# Patient Record
Sex: Female | Born: 1945 | Race: White | Hispanic: Yes | Smoking: Never smoker
Health system: Southern US, Community
[De-identification: ages and names within clinical notes are randomized; demographics above are authoritative.]

## PROBLEM LIST (undated history)

## (undated) VITALS — BP 118/79 | HR 104 | Temp 97.0°F | Resp 16 | Ht 60.0 in | Wt 121.0 lb

## (undated) DIAGNOSIS — R112 Nausea with vomiting, unspecified: Secondary | ICD-10-CM

## (undated) DIAGNOSIS — Z87898 Personal history of other specified conditions: Secondary | ICD-10-CM

## (undated) DIAGNOSIS — K589 Irritable bowel syndrome without diarrhea: Secondary | ICD-10-CM

## (undated) DIAGNOSIS — E785 Hyperlipidemia, unspecified: Secondary | ICD-10-CM

## (undated) DIAGNOSIS — F419 Anxiety disorder, unspecified: Secondary | ICD-10-CM

## (undated) DIAGNOSIS — I10 Essential (primary) hypertension: Secondary | ICD-10-CM

## (undated) DIAGNOSIS — I509 Heart failure, unspecified: Secondary | ICD-10-CM

## (undated) DIAGNOSIS — R51 Headache: Secondary | ICD-10-CM

## (undated) DIAGNOSIS — I421 Obstructive hypertrophic cardiomyopathy: Secondary | ICD-10-CM

## (undated) DIAGNOSIS — M1990 Unspecified osteoarthritis, unspecified site: Secondary | ICD-10-CM

## (undated) DIAGNOSIS — Z973 Presence of spectacles and contact lenses: Secondary | ICD-10-CM

## (undated) DIAGNOSIS — F329 Major depressive disorder, single episode, unspecified: Secondary | ICD-10-CM

## (undated) DIAGNOSIS — I447 Left bundle-branch block, unspecified: Secondary | ICD-10-CM

## (undated) DIAGNOSIS — E039 Hypothyroidism, unspecified: Secondary | ICD-10-CM

## (undated) DIAGNOSIS — S0990XA Unspecified injury of head, initial encounter: Secondary | ICD-10-CM

## (undated) DIAGNOSIS — Z9889 Other specified postprocedural states: Secondary | ICD-10-CM

## (undated) DIAGNOSIS — R7303 Prediabetes: Secondary | ICD-10-CM

## (undated) DIAGNOSIS — J4530 Mild persistent asthma, uncomplicated: Secondary | ICD-10-CM

## (undated) DIAGNOSIS — F410 Panic disorder [episodic paroxysmal anxiety] without agoraphobia: Secondary | ICD-10-CM

## (undated) DIAGNOSIS — K219 Gastro-esophageal reflux disease without esophagitis: Secondary | ICD-10-CM

## (undated) DIAGNOSIS — R002 Palpitations: Secondary | ICD-10-CM

## (undated) DIAGNOSIS — H409 Unspecified glaucoma: Secondary | ICD-10-CM

## (undated) DIAGNOSIS — J189 Pneumonia, unspecified organism: Secondary | ICD-10-CM

## (undated) DIAGNOSIS — F32A Depression, unspecified: Secondary | ICD-10-CM

## (undated) DIAGNOSIS — D649 Anemia, unspecified: Secondary | ICD-10-CM

## (undated) DIAGNOSIS — R519 Headache, unspecified: Secondary | ICD-10-CM

## (undated) DIAGNOSIS — E7800 Pure hypercholesterolemia, unspecified: Secondary | ICD-10-CM

## (undated) HISTORY — PX: LAPAROSCOPIC CHOLECYSTECTOMY: SUR755

## (undated) HISTORY — DX: Anxiety disorder, unspecified: F41.9

## (undated) HISTORY — DX: Obstructive hypertrophic cardiomyopathy: I42.1

## (undated) HISTORY — PX: CARDIAC SURGERY: SHX584

## (undated) HISTORY — PX: TUBAL LIGATION: SHX77

## (undated) HISTORY — DX: Headache: R51

## (undated) HISTORY — DX: Irritable bowel syndrome, unspecified: K58.9

## (undated) HISTORY — DX: Pure hypercholesterolemia, unspecified: E78.00

## (undated) HISTORY — DX: Headache, unspecified: R51.9

## (undated) HISTORY — PX: CATARACT EXTRACTION W/ INTRAOCULAR LENS  IMPLANT, BILATERAL: SHX1307

---

## 2007-09-17 ENCOUNTER — Emergency Department (HOSPITAL_COMMUNITY): Admission: EM | Admit: 2007-09-17 | Discharge: 2007-09-17 | Payer: Self-pay | Admitting: Emergency Medicine

## 2007-10-25 ENCOUNTER — Emergency Department (HOSPITAL_COMMUNITY): Admission: EM | Admit: 2007-10-25 | Discharge: 2007-10-25 | Payer: Self-pay | Admitting: Emergency Medicine

## 2007-12-11 ENCOUNTER — Encounter: Admission: RE | Admit: 2007-12-11 | Discharge: 2007-12-11 | Payer: Self-pay | Admitting: Family Medicine

## 2007-12-13 ENCOUNTER — Emergency Department (HOSPITAL_COMMUNITY): Admission: EM | Admit: 2007-12-13 | Discharge: 2007-12-13 | Payer: Self-pay | Admitting: Emergency Medicine

## 2008-02-12 ENCOUNTER — Emergency Department (HOSPITAL_COMMUNITY): Admission: EM | Admit: 2008-02-12 | Discharge: 2008-02-13 | Payer: Self-pay | Admitting: Emergency Medicine

## 2008-04-28 ENCOUNTER — Emergency Department (HOSPITAL_COMMUNITY): Admission: EM | Admit: 2008-04-28 | Discharge: 2008-04-28 | Payer: Self-pay | Admitting: Emergency Medicine

## 2008-05-11 ENCOUNTER — Encounter: Payer: Self-pay | Admitting: Family Medicine

## 2008-05-11 ENCOUNTER — Ambulatory Visit: Payer: Self-pay | Admitting: Family Medicine

## 2008-05-11 LAB — CONVERTED CEMR LAB
ALT: 35 units/L (ref 0–35)
AST: 31 units/L (ref 0–37)
Albumin: 4.4 g/dL (ref 3.5–5.2)
Alkaline Phosphatase: 89 units/L (ref 39–117)
BUN: 19 mg/dL (ref 6–23)
Basophils Absolute: 0 10*3/uL (ref 0.0–0.1)
Basophils Relative: 1 % (ref 0–1)
CO2: 28 meq/L (ref 19–32)
Calcium: 9 mg/dL (ref 8.4–10.5)
Chloride: 104 meq/L (ref 96–112)
Cholesterol: 126 mg/dL (ref 0–200)
Creatinine, Ser: 0.63 mg/dL (ref 0.40–1.20)
Eosinophils Absolute: 0.1 10*3/uL (ref 0.0–0.7)
Eosinophils Relative: 1 % (ref 0–5)
Glucose, Bld: 87 mg/dL (ref 70–99)
HCT: 41.2 % (ref 36.0–46.0)
HDL: 45 mg/dL (ref >39)
Hemoglobin: 13.1 g/dL (ref 12.0–15.0)
LDL Cholesterol: 61 mg/dL (ref 0–99)
Lymphocytes Relative: 36 % (ref 12–46)
Lymphs Abs: 2.3 10*3/uL (ref 0.7–4.0)
MCHC: 31.8 g/dL (ref 30.0–36.0)
MCV: 89.2 fL (ref 78.0–100.0)
Monocytes Absolute: 0.7 10*3/uL (ref 0.1–1.0)
Monocytes Relative: 11 % (ref 3–12)
Neutro Abs: 3.2 10*3/uL (ref 1.7–7.7)
Neutrophils Relative %: 51 % (ref 43–77)
Platelets: 289 10*3/uL (ref 150–400)
Potassium: 3.9 meq/L (ref 3.5–5.3)
RBC: 4.62 M/uL (ref 3.87–5.11)
RDW: 14.3 % (ref 11.5–15.5)
Sodium: 143 meq/L (ref 135–145)
TSH: 4.375 microintl units/mL (ref 0.350–5.50)
Total Bilirubin: 0.5 mg/dL (ref 0.3–1.2)
Total CHOL/HDL Ratio: 2.8
Total Protein: 7.2 g/dL (ref 6.0–8.3)
Triglycerides: 100 mg/dL (ref <150)
VLDL: 20 mg/dL (ref 0–40)
WBC: 6.3 10*3/uL (ref 4.0–10.5)

## 2008-05-14 ENCOUNTER — Ambulatory Visit (HOSPITAL_COMMUNITY): Admission: RE | Admit: 2008-05-14 | Discharge: 2008-05-14 | Payer: Self-pay | Admitting: Family Medicine

## 2008-09-14 ENCOUNTER — Ambulatory Visit: Payer: Self-pay | Admitting: Family Medicine

## 2008-09-22 ENCOUNTER — Ambulatory Visit (HOSPITAL_COMMUNITY): Admission: RE | Admit: 2008-09-22 | Discharge: 2008-09-22 | Payer: Self-pay | Admitting: Family Medicine

## 2008-09-23 ENCOUNTER — Encounter: Payer: Self-pay | Admitting: Family Medicine

## 2008-09-23 ENCOUNTER — Ambulatory Visit: Payer: Self-pay | Admitting: Internal Medicine

## 2008-09-23 LAB — CONVERTED CEMR LAB
ALT: 20 units/L (ref 0–35)
AST: 23 units/L (ref 0–37)
Albumin: 4.4 g/dL (ref 3.5–5.2)
Alkaline Phosphatase: 89 units/L (ref 39–117)
BUN: 17 mg/dL (ref 6–23)
Basophils Absolute: 0 10*3/uL (ref 0.0–0.1)
Basophils Relative: 1 % (ref 0–1)
CO2: 25 meq/L (ref 19–32)
Calcium: 8.9 mg/dL (ref 8.4–10.5)
Chloride: 105 meq/L (ref 96–112)
Cholesterol: 145 mg/dL (ref 0–200)
Creatinine, Ser: 0.68 mg/dL (ref 0.40–1.20)
Eosinophils Absolute: 0.1 10*3/uL (ref 0.0–0.7)
Eosinophils Relative: 2 % (ref 0–5)
Glucose, Bld: 89 mg/dL (ref 70–99)
HCT: 40.3 % (ref 36.0–46.0)
HDL: 53 mg/dL (ref >39)
Hemoglobin: 13 g/dL (ref 12.0–15.0)
LDL Cholesterol: 73 mg/dL (ref 0–99)
Lymphocytes Relative: 37 % (ref 12–46)
Lymphs Abs: 1.5 10*3/uL (ref 0.7–4.0)
MCHC: 32.3 g/dL (ref 30.0–36.0)
MCV: 86.7 fL (ref 78.0–100.0)
Monocytes Absolute: 0.4 10*3/uL (ref 0.1–1.0)
Monocytes Relative: 9 % (ref 3–12)
Neutro Abs: 2.1 10*3/uL (ref 1.7–7.7)
Neutrophils Relative %: 51 % (ref 43–77)
Platelets: 221 10*3/uL (ref 150–400)
Potassium: 4.3 meq/L (ref 3.5–5.3)
RBC: 4.65 M/uL (ref 3.87–5.11)
RDW: 13.9 % (ref 11.5–15.5)
Sed Rate: 8 mm/hr (ref 0–22)
Sodium: 142 meq/L (ref 135–145)
TSH: 3.21 microintl units/mL (ref 0.350–4.50)
Total Bilirubin: 1.2 mg/dL (ref 0.3–1.2)
Total CHOL/HDL Ratio: 2.7
Total Protein: 7.1 g/dL (ref 6.0–8.3)
Triglycerides: 94 mg/dL (ref <150)
VLDL: 19 mg/dL (ref 0–40)
WBC: 4.1 10*3/uL (ref 4.0–10.5)

## 2008-11-03 ENCOUNTER — Encounter: Payer: Self-pay | Admitting: Internal Medicine

## 2008-11-03 ENCOUNTER — Other Ambulatory Visit: Admission: RE | Admit: 2008-11-03 | Discharge: 2008-11-03 | Payer: Self-pay | Admitting: Internal Medicine

## 2008-11-03 ENCOUNTER — Ambulatory Visit: Payer: Self-pay | Admitting: Internal Medicine

## 2008-11-10 ENCOUNTER — Ambulatory Visit (HOSPITAL_COMMUNITY): Admission: RE | Admit: 2008-11-10 | Discharge: 2008-11-10 | Payer: Self-pay | Admitting: Family Medicine

## 2008-11-24 ENCOUNTER — Emergency Department (HOSPITAL_COMMUNITY): Admission: EM | Admit: 2008-11-24 | Discharge: 2008-11-24 | Payer: Self-pay | Admitting: Emergency Medicine

## 2008-11-25 ENCOUNTER — Ambulatory Visit: Payer: Self-pay | Admitting: Internal Medicine

## 2009-03-22 ENCOUNTER — Emergency Department (HOSPITAL_COMMUNITY): Admission: EM | Admit: 2009-03-22 | Discharge: 2009-03-22 | Payer: Self-pay | Admitting: Emergency Medicine

## 2009-03-25 ENCOUNTER — Emergency Department (HOSPITAL_COMMUNITY): Admission: EM | Admit: 2009-03-25 | Discharge: 2009-03-25 | Payer: Self-pay | Admitting: Emergency Medicine

## 2009-06-16 ENCOUNTER — Ambulatory Visit (HOSPITAL_COMMUNITY): Admission: RE | Admit: 2009-06-16 | Discharge: 2009-06-16 | Payer: Self-pay | Admitting: Internal Medicine

## 2009-08-27 ENCOUNTER — Ambulatory Visit: Payer: Self-pay | Admitting: Internal Medicine

## 2009-11-28 ENCOUNTER — Emergency Department (HOSPITAL_COMMUNITY): Admission: EM | Admit: 2009-11-28 | Discharge: 2009-11-28 | Payer: Self-pay | Admitting: Emergency Medicine

## 2009-11-29 ENCOUNTER — Inpatient Hospital Stay (HOSPITAL_COMMUNITY): Admission: EM | Admit: 2009-11-29 | Discharge: 2009-12-03 | Payer: Self-pay | Admitting: Psychiatry

## 2009-11-29 ENCOUNTER — Ambulatory Visit: Payer: Self-pay | Admitting: Psychiatry

## 2010-01-21 ENCOUNTER — Ambulatory Visit: Payer: Self-pay | Admitting: Internal Medicine

## 2010-02-03 ENCOUNTER — Encounter (INDEPENDENT_AMBULATORY_CARE_PROVIDER_SITE_OTHER): Payer: Self-pay | Admitting: Surgery

## 2010-02-03 ENCOUNTER — Ambulatory Visit (HOSPITAL_COMMUNITY): Admission: RE | Admit: 2010-02-03 | Discharge: 2010-02-04 | Payer: Self-pay | Admitting: Surgery

## 2010-03-30 ENCOUNTER — Emergency Department (HOSPITAL_COMMUNITY): Admission: EM | Admit: 2010-03-30 | Discharge: 2010-03-30 | Payer: Self-pay | Admitting: Emergency Medicine

## 2010-05-04 ENCOUNTER — Ambulatory Visit (HOSPITAL_COMMUNITY): Admission: RE | Admit: 2010-05-04 | Discharge: 2010-05-04 | Payer: Self-pay | Admitting: Gastroenterology

## 2010-06-14 ENCOUNTER — Inpatient Hospital Stay (HOSPITAL_COMMUNITY): Admission: AD | Admit: 2010-06-14 | Discharge: 2010-06-14 | Payer: Self-pay | Admitting: Obstetrics and Gynecology

## 2010-07-19 ENCOUNTER — Ambulatory Visit (HOSPITAL_COMMUNITY): Admission: RE | Admit: 2010-07-19 | Discharge: 2010-07-19 | Payer: Self-pay | Admitting: Family Medicine

## 2010-08-04 ENCOUNTER — Ambulatory Visit: Payer: Self-pay | Admitting: Obstetrics and Gynecology

## 2010-08-11 ENCOUNTER — Ambulatory Visit (HOSPITAL_COMMUNITY): Admission: RE | Admit: 2010-08-11 | Discharge: 2010-08-11 | Payer: Self-pay | Admitting: Obstetrics & Gynecology

## 2010-10-03 ENCOUNTER — Emergency Department (HOSPITAL_COMMUNITY): Admission: EM | Admit: 2010-10-03 | Discharge: 2010-10-03 | Payer: Self-pay | Admitting: Emergency Medicine

## 2010-12-31 ENCOUNTER — Encounter: Payer: Self-pay | Admitting: Gastroenterology

## 2011-01-01 ENCOUNTER — Encounter: Payer: Self-pay | Admitting: Gastroenterology

## 2011-02-22 LAB — DIFFERENTIAL
Basophils Absolute: 0 10*3/uL (ref 0.0–0.1)
Basophils Relative: 1 % (ref 0–1)
Eosinophils Absolute: 0 10*3/uL (ref 0.0–0.7)
Eosinophils Relative: 1 % (ref 0–5)
Lymphocytes Relative: 32 % (ref 12–46)
Lymphs Abs: 1.3 10*3/uL (ref 0.7–4.0)
Monocytes Absolute: 0.3 10*3/uL (ref 0.1–1.0)
Monocytes Relative: 8 % (ref 3–12)
Neutro Abs: 2.4 10*3/uL (ref 1.7–7.7)
Neutrophils Relative %: 58 % (ref 43–77)

## 2011-02-22 LAB — CK TOTAL AND CKMB (NOT AT ARMC)
CK, MB: 1.4 ng/mL (ref 0.3–4.0)
Relative Index: 1.3 (ref 0.0–2.5)
Total CK: 111 U/L (ref 7–177)

## 2011-02-22 LAB — CBC
HCT: 39.5 % (ref 36.0–46.0)
Hemoglobin: 12.7 g/dL (ref 12.0–15.0)
MCH: 27.7 pg (ref 26.0–34.0)
MCHC: 32.2 g/dL (ref 30.0–36.0)
MCV: 86.1 fL (ref 78.0–100.0)
Platelets: 223 10*3/uL (ref 150–400)
RBC: 4.59 MIL/uL (ref 3.87–5.11)
RDW: 13.3 % (ref 11.5–15.5)
WBC: 4.1 10*3/uL (ref 4.0–10.5)

## 2011-02-22 LAB — COMPREHENSIVE METABOLIC PANEL
ALT: 27 U/L (ref 0–35)
AST: 35 U/L (ref 0–37)
Albumin: 3.8 g/dL (ref 3.5–5.2)
Alkaline Phosphatase: 91 U/L (ref 39–117)
BUN: 8 mg/dL (ref 6–23)
CO2: 29 mEq/L (ref 19–32)
Calcium: 9 mg/dL (ref 8.4–10.5)
Chloride: 106 mEq/L (ref 96–112)
Creatinine, Ser: 0.76 mg/dL (ref 0.4–1.2)
GFR calc Af Amer: >60 mL/min (ref >60)
GFR calc non Af Amer: >60 mL/min (ref >60)
Glucose, Bld: 99 mg/dL (ref 70–99)
Potassium: 4.1 mEq/L (ref 3.5–5.1)
Sodium: 141 mEq/L (ref 135–145)
Total Bilirubin: 1.3 mg/dL — ABNORMAL HIGH (ref 0.3–1.2)
Total Protein: 7 g/dL (ref 6.0–8.3)

## 2011-02-22 LAB — LIPASE, BLOOD: Lipase: 26 U/L (ref 11–59)

## 2011-02-22 LAB — TROPONIN I: Troponin I: <0.01 ng/mL (ref 0.00–0.06)

## 2011-02-24 DIAGNOSIS — F411 Generalized anxiety disorder: Secondary | ICD-10-CM | POA: Insufficient documentation

## 2011-02-24 DIAGNOSIS — J309 Allergic rhinitis, unspecified: Secondary | ICD-10-CM | POA: Insufficient documentation

## 2011-02-24 DIAGNOSIS — F339 Major depressive disorder, recurrent, unspecified: Secondary | ICD-10-CM | POA: Insufficient documentation

## 2011-02-26 LAB — URINALYSIS, ROUTINE W REFLEX MICROSCOPIC
Bilirubin Urine: NEGATIVE
Glucose, UA: NEGATIVE mg/dL
Ketones, ur: NEGATIVE mg/dL
Nitrite: NEGATIVE
Protein, ur: NEGATIVE mg/dL
Specific Gravity, Urine: >1.03 — ABNORMAL HIGH (ref 1.005–1.030)
Urobilinogen, UA: 0.2 mg/dL (ref 0.0–1.0)
pH: 6 (ref 5.0–8.0)

## 2011-02-26 LAB — URINE MICROSCOPIC-ADD ON

## 2011-02-28 LAB — URINALYSIS, ROUTINE W REFLEX MICROSCOPIC
Bilirubin Urine: NEGATIVE
Glucose, UA: NEGATIVE mg/dL
Hgb urine dipstick: NEGATIVE
Ketones, ur: NEGATIVE mg/dL
Nitrite: NEGATIVE
Protein, ur: NEGATIVE mg/dL
Specific Gravity, Urine: 1.008 (ref 1.005–1.030)
Urobilinogen, UA: 0.2 mg/dL (ref 0.0–1.0)
pH: 6 (ref 5.0–8.0)

## 2011-02-28 LAB — CBC
HCT: 39.7 % (ref 36.0–46.0)
Hemoglobin: 13.4 g/dL (ref 12.0–15.0)
MCHC: 33.8 g/dL (ref 30.0–36.0)
MCV: 87.2 fL (ref 78.0–100.0)
Platelets: 231 10*3/uL (ref 150–400)
RBC: 4.55 MIL/uL (ref 3.87–5.11)
RDW: 13 % (ref 11.5–15.5)
WBC: 5.8 10*3/uL (ref 4.0–10.5)

## 2011-02-28 LAB — DIFFERENTIAL
Basophils Absolute: 0 10*3/uL (ref 0.0–0.1)
Basophils Relative: 1 % (ref 0–1)
Eosinophils Absolute: 0.1 10*3/uL (ref 0.0–0.7)
Eosinophils Relative: 1 % (ref 0–5)
Lymphocytes Relative: 32 % (ref 12–46)
Lymphs Abs: 1.9 10*3/uL (ref 0.7–4.0)
Monocytes Absolute: 0.5 10*3/uL (ref 0.1–1.0)
Monocytes Relative: 9 % (ref 3–12)
Neutro Abs: 3.3 10*3/uL (ref 1.7–7.7)
Neutrophils Relative %: 57 % (ref 43–77)

## 2011-02-28 LAB — POCT CARDIAC MARKERS
CKMB, poc: <1 ng/mL — ABNORMAL LOW (ref 1.0–8.0)
Myoglobin, poc: 55.4 ng/mL (ref 12–200)
Troponin i, poc: <0.05 ng/mL (ref 0.00–0.09)

## 2011-02-28 LAB — HEPATIC FUNCTION PANEL
ALT: 35 U/L (ref 0–35)
AST: 34 U/L (ref 0–37)
Albumin: 4.1 g/dL (ref 3.5–5.2)
Alkaline Phosphatase: 107 U/L (ref 39–117)
Bilirubin, Direct: 0.2 mg/dL (ref 0.0–0.3)
Indirect Bilirubin: 1.2 mg/dL — ABNORMAL HIGH (ref 0.3–0.9)
Total Bilirubin: 1.4 mg/dL — ABNORMAL HIGH (ref 0.3–1.2)
Total Protein: 7.4 g/dL (ref 6.0–8.3)

## 2011-02-28 LAB — BASIC METABOLIC PANEL
BUN: 15 mg/dL (ref 6–23)
CO2: 30 mEq/L (ref 19–32)
Calcium: 8.6 mg/dL (ref 8.4–10.5)
Chloride: 105 mEq/L (ref 96–112)
Creatinine, Ser: 0.66 mg/dL (ref 0.4–1.2)
GFR calc Af Amer: >60 mL/min (ref >60)
GFR calc non Af Amer: >60 mL/min (ref >60)
Glucose, Bld: 94 mg/dL (ref 70–99)
Potassium: 3.7 mEq/L (ref 3.5–5.1)
Sodium: 138 mEq/L (ref 135–145)

## 2011-02-28 LAB — HEMOCCULT GUIAC POC 1CARD (OFFICE): Fecal Occult Bld: NEGATIVE

## 2011-02-28 LAB — URINE MICROSCOPIC-ADD ON

## 2011-03-01 LAB — COMPREHENSIVE METABOLIC PANEL
ALT: 35 U/L (ref 0–35)
AST: 33 U/L (ref 0–37)
Albumin: 4 g/dL (ref 3.5–5.2)
Alkaline Phosphatase: 103 U/L (ref 39–117)
BUN: 12 mg/dL (ref 6–23)
CO2: 32 mEq/L (ref 19–32)
Calcium: 9.3 mg/dL (ref 8.4–10.5)
Chloride: 103 mEq/L (ref 96–112)
Creatinine, Ser: 0.73 mg/dL (ref 0.4–1.2)
GFR calc Af Amer: >60 mL/min (ref >60)
GFR calc non Af Amer: >60 mL/min (ref >60)
Glucose, Bld: 84 mg/dL (ref 70–99)
Potassium: 4.1 mEq/L (ref 3.5–5.1)
Sodium: 141 mEq/L (ref 135–145)
Total Bilirubin: 1.2 mg/dL (ref 0.3–1.2)
Total Protein: 7.4 g/dL (ref 6.0–8.3)

## 2011-03-01 LAB — CBC
HCT: 41.4 % (ref 36.0–46.0)
Hemoglobin: 14 g/dL (ref 12.0–15.0)
MCHC: 33.8 g/dL (ref 30.0–36.0)
MCV: 87.8 fL (ref 78.0–100.0)
Platelets: 236 10*3/uL (ref 150–400)
RBC: 4.71 MIL/uL (ref 3.87–5.11)
RDW: 12.8 % (ref 11.5–15.5)
WBC: 6 10*3/uL (ref 4.0–10.5)

## 2011-03-13 LAB — DIFFERENTIAL
Basophils Absolute: 0 10*3/uL (ref 0.0–0.1)
Basophils Relative: 1 % (ref 0–1)
Eosinophils Absolute: 0 10*3/uL (ref 0.0–0.7)
Eosinophils Relative: 1 % (ref 0–5)
Lymphocytes Relative: 34 % (ref 12–46)
Lymphs Abs: 1.3 10*3/uL (ref 0.7–4.0)
Monocytes Absolute: 0.3 10*3/uL (ref 0.1–1.0)
Monocytes Relative: 9 % (ref 3–12)
Neutro Abs: 2.2 10*3/uL (ref 1.7–7.7)
Neutrophils Relative %: 56 % (ref 43–77)

## 2011-03-13 LAB — COMPREHENSIVE METABOLIC PANEL
ALT: 36 U/L — ABNORMAL HIGH (ref 0–35)
ALT: 32 U/L (ref 0–35)
AST: 32 U/L (ref 0–37)
AST: 32 U/L (ref 0–37)
Albumin: 3.9 g/dL (ref 3.5–5.2)
Albumin: 4.1 g/dL (ref 3.5–5.2)
Alkaline Phosphatase: 93 U/L (ref 39–117)
Alkaline Phosphatase: 92 U/L (ref 39–117)
BUN: 10 mg/dL (ref 6–23)
BUN: 10 mg/dL (ref 6–23)
CO2: 27 mEq/L (ref 19–32)
CO2: 28 mEq/L (ref 19–32)
Calcium: 8.7 mg/dL (ref 8.4–10.5)
Calcium: 9 mg/dL (ref 8.4–10.5)
Chloride: 105 mEq/L (ref 96–112)
Chloride: 105 mEq/L (ref 96–112)
Creatinine, Ser: 0.71 mg/dL (ref 0.4–1.2)
Creatinine, Ser: 0.86 mg/dL (ref 0.4–1.2)
GFR calc Af Amer: >60 mL/min (ref >60)
GFR calc Af Amer: >60 mL/min (ref >60)
GFR calc non Af Amer: >60 mL/min (ref >60)
GFR calc non Af Amer: >60 mL/min (ref >60)
Glucose, Bld: 100 mg/dL — ABNORMAL HIGH (ref 70–99)
Glucose, Bld: 87 mg/dL (ref 70–99)
Potassium: 3.7 mEq/L (ref 3.5–5.1)
Potassium: 4 mEq/L (ref 3.5–5.1)
Sodium: 139 mEq/L (ref 135–145)
Sodium: 140 mEq/L (ref 135–145)
Total Bilirubin: 1.1 mg/dL (ref 0.3–1.2)
Total Bilirubin: 1.9 mg/dL — ABNORMAL HIGH (ref 0.3–1.2)
Total Protein: 7.2 g/dL (ref 6.0–8.3)
Total Protein: 7.2 g/dL (ref 6.0–8.3)

## 2011-03-13 LAB — CBC
HCT: 39.5 % (ref 36.0–46.0)
HCT: 41.2 % (ref 36.0–46.0)
Hemoglobin: 13.4 g/dL (ref 12.0–15.0)
Hemoglobin: 13.6 g/dL (ref 12.0–15.0)
MCHC: 33.9 g/dL (ref 30.0–36.0)
MCHC: 32.9 g/dL (ref 30.0–36.0)
MCV: 87.4 fL (ref 78.0–100.0)
MCV: 87.6 fL (ref 78.0–100.0)
Platelets: 206 10*3/uL (ref 150–400)
Platelets: 231 10*3/uL (ref 150–400)
RBC: 4.53 MIL/uL (ref 3.87–5.11)
RBC: 4.71 MIL/uL (ref 3.87–5.11)
RDW: 13.1 % (ref 11.5–15.5)
RDW: 13.6 % (ref 11.5–15.5)
WBC: 3.9 10*3/uL — ABNORMAL LOW (ref 4.0–10.5)
WBC: 5.1 10*3/uL (ref 4.0–10.5)

## 2011-03-13 LAB — TSH: TSH: 4.606 u[IU]/mL — ABNORMAL HIGH (ref 0.350–4.500)

## 2011-03-13 LAB — POCT CARDIAC MARKERS
CKMB, poc: 1.2 ng/mL (ref 1.0–8.0)
Myoglobin, poc: 63.5 ng/mL (ref 12–200)
Troponin i, poc: <0.05 ng/mL (ref 0.00–0.09)

## 2011-06-10 ENCOUNTER — Emergency Department (HOSPITAL_COMMUNITY)
Admission: EM | Admit: 2011-06-10 | Discharge: 2011-06-10 | Disposition: A | Discharge status: Home / Self Care | Payer: Medicare Other | Attending: Emergency Medicine | Admitting: Emergency Medicine

## 2011-06-10 ENCOUNTER — Emergency Department (HOSPITAL_COMMUNITY): Payer: Medicare Other

## 2011-06-10 DIAGNOSIS — R5381 Other malaise: Secondary | ICD-10-CM | POA: Insufficient documentation

## 2011-06-10 DIAGNOSIS — J45909 Unspecified asthma, uncomplicated: Secondary | ICD-10-CM | POA: Insufficient documentation

## 2011-06-10 DIAGNOSIS — R059 Cough, unspecified: Secondary | ICD-10-CM | POA: Insufficient documentation

## 2011-06-10 DIAGNOSIS — I10 Essential (primary) hypertension: Secondary | ICD-10-CM | POA: Insufficient documentation

## 2011-06-10 DIAGNOSIS — I2510 Atherosclerotic heart disease of native coronary artery without angina pectoris: Secondary | ICD-10-CM | POA: Insufficient documentation

## 2011-06-10 DIAGNOSIS — R05 Cough: Secondary | ICD-10-CM | POA: Insufficient documentation

## 2011-08-31 LAB — POCT URINALYSIS DIP (DEVICE)
Glucose, UA: NEGATIVE
Nitrite: NEGATIVE
Operator id: 200941
Protein, ur: NEGATIVE
Specific Gravity, Urine: 1.02
Urobilinogen, UA: 0.2
pH: 5

## 2011-08-31 LAB — URINE CULTURE
Colony Count: NO GROWTH
Culture: NO GROWTH

## 2011-09-04 LAB — I-STAT 8, (EC8 V) (CONVERTED LAB)
Acid-Base Excess: 3 — ABNORMAL HIGH
BUN: 17
Bicarbonate: 28.5 — ABNORMAL HIGH
Chloride: 104
Glucose, Bld: 102 — ABNORMAL HIGH
HCT: 44
Hemoglobin: 15
Operator id: 294341
Potassium: 3.7
Sodium: 138
TCO2: 30
pCO2, Ven: 44.9 — ABNORMAL LOW
pH, Ven: 7.41 — ABNORMAL HIGH

## 2011-09-04 LAB — CBC
HCT: 37.3
Hemoglobin: 12.7
MCHC: 33.9
MCV: 85.2
Platelets: 215
RBC: 4.38
RDW: 13.3
WBC: 13.6 — ABNORMAL HIGH

## 2011-09-04 LAB — DIFFERENTIAL
Basophils Absolute: 0
Basophils Relative: 0
Eosinophils Absolute: 0
Eosinophils Relative: 0
Lymphocytes Relative: 10 — ABNORMAL LOW
Lymphs Abs: 1.3
Monocytes Absolute: 1.1 — ABNORMAL HIGH
Monocytes Relative: 8
Neutro Abs: 11.2 — ABNORMAL HIGH
Neutrophils Relative %: 82 — ABNORMAL HIGH

## 2011-09-04 LAB — POCT CARDIAC MARKERS
CKMB, poc: 1.6
Myoglobin, poc: 42.4
Operator id: 294341
Troponin i, poc: <0.05

## 2011-09-04 LAB — POCT I-STAT CREATININE
Creatinine, Ser: 0.9
Operator id: 294341

## 2011-09-06 LAB — URINALYSIS, ROUTINE W REFLEX MICROSCOPIC
Bilirubin Urine: NEGATIVE
Glucose, UA: NEGATIVE
Hgb urine dipstick: NEGATIVE
Ketones, ur: 40 — AB
Leukocytes, UA: NEGATIVE
Nitrite: NEGATIVE
Protein, ur: 30 — AB
Specific Gravity, Urine: 1.023
Urobilinogen, UA: 0.2
pH: 7.5

## 2011-09-06 LAB — URINE MICROSCOPIC-ADD ON

## 2011-09-06 LAB — BASIC METABOLIC PANEL
BUN: 18
CO2: 25
Calcium: 9.4
Chloride: 104
Creatinine, Ser: 0.66
GFR calc Af Amer: >60
GFR calc non Af Amer: >60
Glucose, Bld: 89
Potassium: 3.7
Sodium: 139

## 2011-09-15 LAB — POCT I-STAT, CHEM 8
BUN: 13 mg/dL (ref 6–23)
Calcium, Ion: 1.14 mmol/L (ref 1.12–1.32)
Chloride: 104 mEq/L (ref 96–112)
Creatinine, Ser: 0.9 mg/dL (ref 0.4–1.2)
Glucose, Bld: 88 mg/dL (ref 70–99)
HCT: 44 % (ref 36.0–46.0)
Hemoglobin: 15 g/dL (ref 12.0–15.0)
Potassium: 3.9 mEq/L (ref 3.5–5.1)
Sodium: 141 mEq/L (ref 135–145)
TCO2: 29 mmol/L (ref 0–100)

## 2011-09-15 LAB — SALICYLATE LEVEL: Salicylate Lvl: <4 mg/dL (ref 2.8–20.0)

## 2011-09-15 LAB — URINALYSIS, ROUTINE W REFLEX MICROSCOPIC
Bilirubin Urine: NEGATIVE
Glucose, UA: NEGATIVE mg/dL
Hgb urine dipstick: NEGATIVE
Ketones, ur: NEGATIVE mg/dL
Nitrite: NEGATIVE
Protein, ur: NEGATIVE mg/dL
Specific Gravity, Urine: 1.013 (ref 1.005–1.030)
Urobilinogen, UA: 0.2 mg/dL (ref 0.0–1.0)
pH: 7 (ref 5.0–8.0)

## 2011-09-15 LAB — URINE MICROSCOPIC-ADD ON

## 2011-09-15 LAB — RAPID URINE DRUG SCREEN, HOSP PERFORMED
Amphetamines: NOT DETECTED
Barbiturates: NOT DETECTED
Benzodiazepines: POSITIVE — AB
Cocaine: NOT DETECTED
Opiates: NOT DETECTED
Tetrahydrocannabinol: NOT DETECTED

## 2011-09-15 LAB — ETHANOL: Alcohol, Ethyl (B): <5 mg/dL (ref 0–10)

## 2011-09-15 LAB — ACETAMINOPHEN LEVEL: Acetaminophen (Tylenol), Serum: <10 ug/mL — ABNORMAL LOW (ref 10–30)

## 2011-09-19 LAB — I-STAT 8, (EC8 V) (CONVERTED LAB)
Acid-Base Excess: 3 — ABNORMAL HIGH
BUN: 16
Bicarbonate: 27.9 — ABNORMAL HIGH
Chloride: 105
Glucose, Bld: 106 — ABNORMAL HIGH
HCT: 45
Hemoglobin: 15.3 — ABNORMAL HIGH
Operator id: 114141
Potassium: 3.9
Sodium: 139
TCO2: 29
pCO2, Ven: 41.3 — ABNORMAL LOW
pH, Ven: 7.438 — ABNORMAL HIGH

## 2011-09-19 LAB — POCT I-STAT CREATININE
Creatinine, Ser: 0.8
Operator id: 114141

## 2011-09-19 LAB — COMPREHENSIVE METABOLIC PANEL
ALT: 19
AST: 44 — ABNORMAL HIGH
Albumin: 4.1
Alkaline Phosphatase: 101
BUN: 15
CO2: 29
Calcium: 9.1
Chloride: 102
Creatinine, Ser: 0.81
GFR calc Af Amer: >60
GFR calc non Af Amer: >60
Glucose, Bld: 114 — ABNORMAL HIGH
Potassium: 4.2
Sodium: 140
Total Bilirubin: 1.7 — ABNORMAL HIGH
Total Protein: 7.2

## 2011-09-19 LAB — MAGNESIUM: Magnesium: 2.2

## 2011-09-19 LAB — TSH: TSH: 8.469 — ABNORMAL HIGH

## 2011-09-19 LAB — CBC
HCT: 39.9
Hemoglobin: 13.4
MCHC: 33.6
MCV: 84.8
Platelets: 270
RBC: 4.71
RDW: 13.3
WBC: 4.3

## 2011-09-19 LAB — POCT CARDIAC MARKERS
CKMB, poc: 1.8
Myoglobin, poc: 71
Operator id: 114141
Troponin i, poc: <0.05

## 2011-09-19 LAB — D-DIMER, QUANTITATIVE: D-Dimer, Quant: 0.8 — ABNORMAL HIGH

## 2011-11-19 ENCOUNTER — Other Ambulatory Visit: Payer: Self-pay

## 2011-11-19 ENCOUNTER — Emergency Department (HOSPITAL_COMMUNITY)
Admission: EM | Admit: 2011-11-19 | Discharge: 2011-11-19 | Disposition: A | Discharge status: Home / Self Care | Payer: Medicare Other | Attending: Emergency Medicine | Admitting: Emergency Medicine

## 2011-11-19 ENCOUNTER — Emergency Department (HOSPITAL_COMMUNITY): Payer: Medicare Other

## 2011-11-19 ENCOUNTER — Encounter: Payer: Self-pay | Admitting: *Deleted

## 2011-11-19 DIAGNOSIS — R51 Headache: Secondary | ICD-10-CM | POA: Insufficient documentation

## 2011-11-19 DIAGNOSIS — J4530 Mild persistent asthma, uncomplicated: Secondary | ICD-10-CM | POA: Insufficient documentation

## 2011-11-19 DIAGNOSIS — R079 Chest pain, unspecified: Secondary | ICD-10-CM | POA: Insufficient documentation

## 2011-11-19 DIAGNOSIS — F411 Generalized anxiety disorder: Secondary | ICD-10-CM | POA: Insufficient documentation

## 2011-11-19 DIAGNOSIS — Z9889 Other specified postprocedural states: Secondary | ICD-10-CM | POA: Insufficient documentation

## 2011-11-19 DIAGNOSIS — I422 Other hypertrophic cardiomyopathy: Secondary | ICD-10-CM | POA: Insufficient documentation

## 2011-11-19 DIAGNOSIS — J45909 Unspecified asthma, uncomplicated: Secondary | ICD-10-CM | POA: Insufficient documentation

## 2011-11-19 DIAGNOSIS — E782 Mixed hyperlipidemia: Secondary | ICD-10-CM | POA: Insufficient documentation

## 2011-11-19 DIAGNOSIS — I2510 Atherosclerotic heart disease of native coronary artery without angina pectoris: Secondary | ICD-10-CM | POA: Insufficient documentation

## 2011-11-19 DIAGNOSIS — K219 Gastro-esophageal reflux disease without esophagitis: Secondary | ICD-10-CM | POA: Insufficient documentation

## 2011-11-19 DIAGNOSIS — Z79899 Other long term (current) drug therapy: Secondary | ICD-10-CM | POA: Insufficient documentation

## 2011-11-19 DIAGNOSIS — E785 Hyperlipidemia, unspecified: Secondary | ICD-10-CM

## 2011-11-19 DIAGNOSIS — R002 Palpitations: Secondary | ICD-10-CM | POA: Insufficient documentation

## 2011-11-19 DIAGNOSIS — I10 Essential (primary) hypertension: Secondary | ICD-10-CM | POA: Insufficient documentation

## 2011-11-19 DIAGNOSIS — R42 Dizziness and giddiness: Secondary | ICD-10-CM | POA: Insufficient documentation

## 2011-11-19 DIAGNOSIS — F410 Panic disorder [episodic paroxysmal anxiety] without agoraphobia: Secondary | ICD-10-CM | POA: Insufficient documentation

## 2011-11-19 HISTORY — DX: Gastro-esophageal reflux disease without esophagitis: K21.9

## 2011-11-19 HISTORY — DX: Palpitations: R00.2

## 2011-11-19 HISTORY — DX: Hyperlipidemia, unspecified: E78.5

## 2011-11-19 HISTORY — DX: Panic disorder (episodic paroxysmal anxiety): F41.0

## 2011-11-19 LAB — CBC
HCT: 37.9 % (ref 36.0–46.0)
Hemoglobin: 12.6 g/dL (ref 12.0–15.0)
MCH: 29 pg (ref 26.0–34.0)
MCHC: 33.2 g/dL (ref 30.0–36.0)
MCV: 87.1 fL (ref 78.0–100.0)
Platelets: 219 10*3/uL (ref 150–400)
RBC: 4.35 MIL/uL (ref 3.87–5.11)
RDW: 12.9 % (ref 11.5–15.5)
WBC: 5.4 10*3/uL (ref 4.0–10.5)

## 2011-11-19 LAB — BASIC METABOLIC PANEL
BUN: 16 mg/dL (ref 6–23)
CO2: 31 mEq/L (ref 19–32)
Calcium: 9.1 mg/dL (ref 8.4–10.5)
Chloride: 103 mEq/L (ref 96–112)
Creatinine, Ser: 0.71 mg/dL (ref 0.50–1.10)
GFR calc Af Amer: >90 mL/min (ref >90)
GFR calc non Af Amer: 88 mL/min — ABNORMAL LOW (ref >90)
Glucose, Bld: 83 mg/dL (ref 70–99)
Potassium: 3.4 mEq/L — ABNORMAL LOW (ref 3.5–5.1)
Sodium: 141 mEq/L (ref 135–145)

## 2011-11-19 LAB — POCT I-STAT TROPONIN I
Troponin i, poc: 0 ng/mL (ref 0.00–0.08)
Troponin i, poc: 0.01 ng/mL (ref 0.00–0.08)

## 2011-11-19 MED ORDER — METOPROLOL TARTRATE 12.5 MG HALF TABLET: 12.5000 mg | ORAL_TABLET | Freq: Two times a day (BID) | ORAL | Status: DC

## 2011-11-19 MED ORDER — METOPROLOL TARTRATE 25 MG PO TABS
12.5000 mg | ORAL_TABLET | Freq: Once | ORAL | Status: AC
  Administered 2011-11-19: 12.5 mg via ORAL
  Filled 2011-11-19: qty 1

## 2011-11-19 NOTE — ED Notes (Signed)
Patient transported to X-ray 

## 2011-11-19 NOTE — Consult Note (Signed)
[facility]       CONSULTATION NOTE  Reason for Consult: Palpitations, weakness  Requesting Physician: Dr. Anitra Lauth  HPI: This is a 65 y.o. female Jehov[employer]'s Witness with a past medical history significant for hypertrophic cardiomyopathy status post septal myectomy in August of 2011. A recent echocardiogram performed in our office in September showed an EF greater than 55% and septal measurements of about 1.7-1.8 cm. She also has a history of shingles which bothered her for greater than one year it is and he marked dyslipidemia on Vytorin. Recently she's been having some weakness and feelings of palpitations. She presented to the ER tonight after having an episode of palpitations which were unrelenting and church this morning. Afterward she felt tightness in her chest and throat as well as numbness in both arms and overall generalized weakness. The episode reportedly lasted for 3-5 minutes. She's not complained of any significant chest pain in the past. I'm not aware of the last stress test she had in our office. Currently she is asymptomatic however does feel somewhat nervous about her symptoms.  PMHx:  Past Medical History  Diagnosis Date  . Panic attacks   . Hypertrophic cardiomyopathy   . GERD (gastroesophageal reflux disease)   . HLD (hyperlipidemia)   . Asthma   . HTN (hypertension)   . Palpitations    Past Surgical History  Procedure Date  . Coronary artery bypass graft   . Tubal ligation   . Cholecystectomy     FAMHx: History reviewed. No pertinent family history. Family members were screened negative genetically for HOCM.  SOCHx:  reports that she has never smoked. She does not have any smokeless tobacco history on file. She reports that she does not drink alcohol or use illicit drugs.  ALLERGIES: No Known Allergies  ROS: A comprehensive review of systems was negative except for: Constitutional: positive for fatigue Cardiovascular:  positive for chest pain, near-syncope and palpitations Musculoskeletal: positive for muscle weakness Behavioral/Psych: positive for anxiety  HOME MEDICATIONS:  (Not in a hospital admission)  HOSPITAL MEDICATIONS: Prior to Admission:   1. Protonix 40 mg daily 2. Vytorin 09/20/39 mg daily 3. Celexa 40 mg daily 4. Clonazepam 2 mg each bedtime 5. Super B complex vitamin 6. Calcium and vitamin D 7. Ostial biflex 8. Systane eye drops  VITALS: Blood pressure 123/81, pulse 81, temperature 97.7 F (36.5 C), temperature source Oral, resp. rate 19, SpO2 100.00%.  PHYSICAL EXAM: General appearance: alert and anxious Neck: no adenopathy, no carotid bruit, no JVD, supple, symmetrical, trachea midline and thyroid not enlarged, symmetric, no tenderness/mass/nodules Lungs: clear to auscultation bilaterally Heart: regular rate and rhythm, S1, S2 normal and systolic murmur: early systolic 2/6, low pitch at 2nd right intercostal space Abdomen: soft, non-tender; bowel sounds normal; no masses,  no organomegaly Extremities: extremities normal, atraumatic, no cyanosis or edema Pulses: 2+ and symmetric Skin: Skin color, texture, turgor normal. No rashes or lesions Neurologic: Grossly normal  LABS: Results for orders placed during the hospital encounter of 11/19/11 (from the past 48 hour(s))  CBC     Status: Normal   Collection Time   11/19/11  4:06 PM      Component Value Range Comment   WBC 5.4  4.0 - 10.5 (K/uL)    RBC 4.35  3.87 - 5.11 (MIL/uL)    Hemoglobin 12.6  12.0 - 15.0 (g/dL)    HCT 16.1  09.6 - 04.5 (%)    MCV 87.1  78.0 - 100.0 (  fL)    MCH 29.0  26.0 - 34.0 (pg)    MCHC 33.2  30.0 - 36.0 (g/dL)    RDW 04.5  40.9 - 81.1 (%)    Platelets 219  150 - 400 (K/uL)   BASIC METABOLIC PANEL     Status: Abnormal   Collection Time   11/19/11  4:06 PM      Component Value Range Comment   Sodium 141  135 - 145 (mEq/L)    Potassium 3.4 (*) 3.5 - 5.1 (mEq/L)    Chloride 103  96 - 112  (mEq/L)    CO2 31  19 - 32 (mEq/L)    Glucose, Bld 83  70 - 99 (mg/dL)    BUN 16  6 - 23 (mg/dL)    Creatinine, Ser 9.14  0.50 - 1.10 (mg/dL)    Calcium 9.1  8.4 - 10.5 (mg/dL)    GFR calc non Af Amer 88 (*) >90 (mL/min)    GFR calc Af Amer >90  >90 (mL/min)   POCT I-STAT TROPONIN I     Status: Normal   Collection Time   11/19/11  4:45 PM      Component Value Range Comment   Troponin i, poc 0.00  0.00 - 0.08 (ng/mL)    Comment 3            POCT I-STAT TROPONIN I     Status: Normal   Collection Time   11/19/11  8:26 PM      Component Value Range Comment   Troponin i, poc 0.01  0.00 - 0.08 (ng/mL)    Comment 3              IMAGING: Dg Chest 2 View  11/19/2011  *RADIOLOGY REPORT*  Clinical Data: Chest palpitations and chest pain.  CHEST - 2 VIEW  Comparison: Plain films of the chest 06/10/2011 and 02/13/2008.  Findings: The patient is status post CABG.  The lungs are clear. Heart size is normal.  No pneumothorax or pleural effusion.  IMPRESSION: No acute disease.  Original Report Authenticated By: Bernadene Bell. D'ALESSIO, M.D.   EKG:  Sinus rhythm with left bundle branch block, unchanged from previous EKG.   IMPRESSION: 1. Palpitations 2. Chest pain, possibly secondary to palpitations 3. Hypertension 4. Dyslipidemia 5. Hypertrophic cardiomyopathy status post septal myectomy  RECOMMENDATION: 1. Ms. Axelson is describing tachypalpitations may represent atrial ventricular arrhythmias. She has discontinued her aspirin and is not currently on a beta blocker. The symptoms are somewhat atypical for cardiac chest pain, however not aware that she has had a stress test recently.  If her second set of cardiac enzymes is negative, I feel that she could be safely discharged. I would recommend an outpatient stress test in our office.   2. I prescribed her for low-dose beta blocker. In addition she should resume taking aspirin 81 mg daily. 3. Our office will contact her for followup this  week.  Thank you for this interesting consult.  Time Spent Directly with Patient: 30 minutes  Chrystie Nose, MD Attending Cardiologist [facility]  Eriq Hufford C 11/19/2011, 9:01 PM

## 2011-11-19 NOTE — ED Notes (Signed)
Reports having intermittent chest pains for extended amount of time, today had onset at 1330 of mid chest pain, felt like heart was racing and had near syncope. At this time, reports still mild pain and generalized fatigue. ekg done at triage.

## 2011-11-19 NOTE — Discharge Instructions (Signed)
Palpitations  A palpitation is the feeling that your heartbeat is irregular or is faster than normal. Although this is frightening, it usually is not serious. Palpitations may be caused by excesses of smoking, caffeine, or alcohol. They are also brought on by stress and anxiety. Sometimes, they are caused by heart disease. Unless otherwise noted, your caregiver did not find any signs of serious illness at this time. HOME CARE INSTRUCTIONS  To help prevent palpitations:  Drink decaffeinated coffee, tea, and soda pop. Avoid chocolate.   If you smoke or drink alcohol, quit or cut down as much as possible.   Reduce your stress or anxiety level. Biofeedback, yoga, or meditation will help you relax. Physical activity such as swimming, jogging, or walking also may be helpful.  SEEK MEDICAL CARE IF:   You continue to have a fast heartbeat.   Your palpitations occur more often.  SEEK IMMEDIATE MEDICAL CARE IF: You develop chest pain, shortness of breath, severe headache, dizziness, or fainting. Document Released: 11/24/2000 Document Revised: 08/09/2011 Document Reviewed: 01/24/2008 [employer] Patient Information 2012 [place], [place].    Metoprolol tablets What is this medicine? METOPROLOL (me TOE proe lole) is a beta-blocker. Beta-blockers reduce the workload on the heart and help it to beat more regularly. This medicine is used to treat high blood pressure and to prevent chest pain. It is also used to after a heart attack and to prevent an additional heart attack from occurring. This medicine may be used for other purposes; ask your health care provider or pharmacist if you have questions. What should I tell my health care provider before I take this medicine? They need to know if you have any of these conditions: -diabetes -heart or vessel disease like slow heart rate, worsening heart failure, heart block, sick sinus syndrome or Raynaud's disease -kidney disease -liver disease -lung or  breathing disease, like asthma or emphysema -pheochromocytoma -thyroid disease -an unusual or allergic reaction to metoprolol, other beta-blockers, medicines, foods, dyes, or preservatives -pregnant or trying to get pregnant -breast-feeding How should I use this medicine? Take this medicine by mouth with a drink of water. Follow the directions on the prescription label. Take this medicine immediately after meals. Take your doses at regular intervals. Do not take more medicine than directed. Do not stop taking this medicine suddenly. This could lead to serious heart-related effects. Talk to your pediatrician regarding the use of this medicine in children. Special care may be needed. Overdosage: If you think you have taken too much of this medicine contact a poison control center or emergency room at once. NOTE: This medicine is only for you. Do not share this medicine with others. What if I miss a dose? If you miss a dose, take it as soon as you can. If it is almost time for your next dose, take only that dose. Do not take double or extra doses. What may interact with this medicine? Do not take this medicine with any of the following medications: -sotalol This medicine may also interact with the following medications: -clonidine -digoxin -dobutamine -epinephrine -isoproterenol -medicine to control heart rhythm like quinidine, propafenone -medicine for depression like monoamine oxidase (MAO) inhibitors, fluoxetine, and paroxetine -medicine for high blood pressure like calcium channel blockers -reserpine This list may not describe all possible interactions. Give your health care provider a list of all the medicines, herbs, non-prescription drugs, or dietary supplements you use. Also tell them if you smoke, drink alcohol, or use illegal drugs. Some items may interact with  your medicine. What should I watch for while using this medicine? Visit your doctor or health care professional for  regular check ups. Contact your doctor right away if your symptoms worsen. Check your blood pressure and pulse rate regularly. Ask your health care professional what your blood pressure and pulse rate should be, and when you should contact them. You may get drowsy or dizzy. Do not drive, use machinery, or do anything that needs mental alertness until you know how this medicine affects you. Do not sit or stand up quickly, especially if you are an older patient. This reduces the risk of dizzy or fainting spells. Contact your doctor if these symptoms continue. Alcohol may interfere with the effect of this medicine. Avoid alcoholic drinks. What side effects may I notice from receiving this medicine? Side effects that you should report to your doctor or health care professional as soon as possible: -allergic reactions like skin rash, itching or hives -cold or numb hands or feet -depression -difficulty breathing -faint -fever with sore throat -irregular heartbeat, chest pain -rapid weight gain -swollen legs or ankles Side effects that usually do not require medical attention (report to your doctor or health care professional if they continue or are bothersome): -anxiety or nervousness -change in sex drive or performance -dry skin -headache -nightmares or trouble sleeping -short term memory loss -stomach upset or diarrhea -unusually tired This list may not describe all possible side effects. Call your doctor for medical advice about side effects. You may report side effects to FDA at 1-800-FDA-1088. Where should I keep my medicine? Keep out of the reach of children. Store at room temperature between 15 and 30 degrees C (59 and 86 degrees F). Throw away any unused medicine after the expiration date. NOTE: This sheet is a summary. It may not cover all possible information. If you have questions about this medicine, talk to your doctor, pharmacist, or health care provider.  2012, [employer]vier[employer]  Standard. (02/07/2008 4:11:19 PM)

## 2011-11-19 NOTE — ED Notes (Signed)
C/o hunger, requesting food & drink, given, pending card consult, (denies: pain, sob, dizziness, nausea, numbness, tingling, palpitations, fluttering or other sx).

## 2011-11-19 NOTE — ED Provider Notes (Signed)
History     CSN: [id number] Arrival date & time: 11/19/2011  3:07 PM   First MD Initiated Contact with Patient 11/19/11 1524      Chief Complaint  Patient presents with  . Chest Pain    (Consider location/radiation/quality/duration/timing/severity/associated sxs/prior treatment) The history is provided by the patient and a relative.   The patient is a 65 year old female with history of coronary artery disease, anxiety, hypertrophic cardiomyopathy, hyperlipidemia, and hypertension who presents with a complaint of palpitations and a "funny feeling all over my body" starting about 1:30 PM today. He was acute in onset and started while she was sitting. The palpitations lasted for a few seconds while the "funny all over" feeling lasted for a few minutes. She describes some associated chest tightness with the palpitations, as well as a transient mild headache. She denies any associated shortness of breath, nausea, diaphoresis. Nothing seemed to make the pain better or worse while at lasted. There was no prior treatment.  The patient also notes that she has had intermittent chest and/or upper, pain for the last several weeks. The pain is vague and she is not able to describe it or to locate it with any certainty. She is unable to describe any accompanying symptoms and does not note any known alleviating or aggravating factors.  Her family member reports that she had been describing chest pain recently that occurs during the day during various activities. He notes that she does sometimes get chest discomfort with her panic attacks but these always occur at night.    Past Medical History  Diagnosis Date  . Coronary artery disease   . Panic attacks   . Hypertrophic cardiomyopathy   . GERD (gastroesophageal reflux disease)   . HLD (hyperlipidemia)   . Asthma   . HTN (hypertension)     Past Surgical History  Procedure Date  . Coronary artery bypass graft   . Tubal ligation   .  Cholecystectomy     History reviewed. No pertinent family history.  History  Substance Use Topics  . Smoking status: Never Smoker   . Smokeless tobacco: Not on file  . Alcohol Use: No    Review of Systems  Constitutional: Negative for fever and chills.  HENT: Negative for ear pain, congestion, sore throat, neck pain, neck stiffness and tinnitus.   Eyes: Negative for pain and visual disturbance.  Respiratory: Positive for chest tightness. Negative for cough and shortness of breath.   Cardiovascular: Positive for chest pain and palpitations. Negative for leg swelling.  Gastrointestinal: Negative for nausea, vomiting, abdominal pain and diarrhea.  Musculoskeletal: Negative for back pain, joint swelling and gait problem.  Skin: Negative for rash and wound.  Neurological: Positive for dizziness and headaches. Negative for seizures, syncope, facial asymmetry, weakness, light-headedness and numbness.  Psychiatric/Behavioral: Negative for behavioral problems and confusion.    Allergies  Review of patient's allergies indicates no known allergies.  Home Medications   Current Outpatient Rx  Name Route Sig Dispense Refill  . B COMPLEX-C PO TABS Oral Take 1 tablet by mouth daily.      Marland Kitchen CALCIUM-MAGNESIUM-ZINC 1000-400-15 MG PO TABS Oral Take 1 tablet by mouth 3 (three) times daily.      Marland Kitchen VITAMIN D 1000 UNITS PO TABS Oral Take 5,000 Units by mouth daily.      Marland Kitchen CITALOPRAM HYDROBROMIDE 20 MG PO TABS Oral Take 20 mg by mouth at bedtime.      Marland Kitchen CLONAZEPAM 2 MG PO TABS Oral Take 2  mg by mouth at bedtime.      Marland Kitchen EZETIMIBE-SIMVASTATIN 10-40 MG PO TABS Oral Take 0.5 tablets by mouth at bedtime.      . OMEGA-3 FATTY ACIDS 1000 MG PO CAPS Oral Take 1 g by mouth daily.      . OSTEO BI-FLEX ADV DOUBLE ST PO Oral Take 1 tablet by mouth 2 (two) times daily.      . MOMETASONE FUROATE 220 MCG/INH IN AEPB Inhalation Inhale 2 puffs into the lungs every morning.      Carma Leaven M PLUS PO TABS Oral Take 1  tablet by mouth daily.      Marland Kitchen PANTOPRAZOLE SODIUM 40 MG PO TBEC Oral Take 40 mg by mouth daily.      Marland Kitchen POLYETHYL GLYCOL-PROPYL GLYCOL 0.4-0.3 % OP SOLN Ophthalmic Apply 1 drop to eye 2 (two) times daily.        BP 124/82  Pulse 77  Temp(Src) 98.4 F (36.9 C) (Oral)  Resp 18  SpO2 99%  Physical Exam  Nursing note and vitals reviewed. Constitutional: She is oriented to person, place, and time. She appears well-developed and well-nourished. No distress.  HENT:  Head: Normocephalic and atraumatic.  Right Ear: External ear normal.  Left Ear: External ear normal.  Mouth/Throat: Oropharynx is clear and moist.  Eyes: EOM are normal. Pupils are equal, round, and reactive to light.  Neck: Normal range of motion. Neck supple.  Cardiovascular: Normal rate, regular rhythm, normal heart sounds and intact distal pulses.   No murmur heard. Pulmonary/Chest: Effort normal and breath sounds normal. No respiratory distress. She has no wheezes. She exhibits no tenderness.  Abdominal: Soft. Bowel sounds are normal. She exhibits no distension and no mass. There is no tenderness. There is no guarding.  Musculoskeletal: Normal range of motion. She exhibits no edema and no tenderness.  Lymphadenopathy:    She has no cervical adenopathy.  Neurological: She is alert and oriented to person, place, and time. No cranial nerve deficit. Coordination normal.       Finger to nose intact bilaterally. Sensation intact to light touch.  Skin: Skin is warm and dry. No rash noted.  Psychiatric: Her mood appears anxious.    ED Course  Procedures (including critical care time)  Labs Reviewed  BASIC METABOLIC PANEL - Abnormal; Notable for the following:    Potassium 3.4 (*)    GFR calc non Af Amer 88 (*)    All other components within normal limits  CBC  POCT I-STAT TROPONIN I  I-STAT TROPONIN I   Dg Chest 2 View  11/19/2011  *RADIOLOGY REPORT*  Clinical Data: Chest palpitations and chest pain.  CHEST - 2 VIEW   Comparison: Plain films of the chest 06/10/2011 and 02/13/2008.  Findings: The patient is status post CABG.  The lungs are clear. Heart size is normal.  No pneumothorax or pleural effusion.  IMPRESSION: No acute disease.  Original Report Authenticated By: Bernadene Bell. Maricela Curet, M.D.    Date: 11/19/2011  Rate: 76  Rhythm: normal sinus rhythm  QRS Axis: normal  Intervals: normal  ST/T Wave abnormalities: normal  Conduction Disutrbances:left bundle branch block  Narrative Interpretation:   Old EKG Reviewed: unchanged    1. Panic attacks   2. Hypertrophic cardiomyopathy   3. GERD (gastroesophageal reflux disease)   4. HLD (hyperlipidemia)   5. Asthma   6. HTN (hypertension)   7. Palpitations       MDM  3:51 PM Patient seen and evaluated. Initial history  and physical examination complete. Workup initiated. Will continue to follow closely.   7:00 PM I have reviewed the labs and x-rays as well as the patient EKG. There are no acute findings. I have placed a call to [facility] heart and vascular with the plan to have the patient admitted for further evaluation of her recent recurrent chest pain. A friend of the patient has arrived to was present during her episode today he notes that the patient did complain of left arm and neck pain while she was experiencing the palpitations.      8:02 PM I have put, Dr. Rennis Golden, with [facility] and Vascular. He is familiar with this patient. The mid-level practitioner with his practice will see the patient and get back to me with a plan.   9:22 PM I have spoken with Dr Rennis Golden, who saw the patient and would like to have her begin a beta-blocker Rx with continued workup as an outpatient. I will discharge her home with this prescription.  Elwyn Reach [place], [place] 11/19/11 2129

## 2011-11-19 NOTE — ED Provider Notes (Signed)
Medical screening examination/treatment/procedure(s) were conducted as a shared visit with non-physician practitioner(s) and myself.  I personally evaluated the patient during the encounter Patient with nondescript chest pain and palpitations. Palpitations sound as if it could be from a dysrhythmia however currently she is in sinus rhythm. Patient was seen by Dr. Adolm Joseph with [place] is alert and was placed on a beta blocker and will follow up on Monday the placement of a monitor.  Gwyneth Sprout, MD 11/19/11 [id number]

## 2011-11-19 NOTE — ED Notes (Signed)
Pending arrival of cardiology consult, family at [facility].

## 2011-11-19 NOTE — ED Notes (Signed)
Awaiting medicine to be verified by pharmacy and sent

## 2011-12-20 ENCOUNTER — Ambulatory Visit (INDEPENDENT_AMBULATORY_CARE_PROVIDER_SITE_OTHER): Payer: Medicare Other | Admitting: Obstetrics and Gynecology

## 2011-12-20 ENCOUNTER — Other Ambulatory Visit: Payer: Self-pay | Admitting: Obstetrics and Gynecology

## 2011-12-20 ENCOUNTER — Encounter: Payer: Self-pay | Admitting: Obstetrics and Gynecology

## 2011-12-20 DIAGNOSIS — N949 Unspecified condition associated with female genital organs and menstrual cycle: Secondary | ICD-10-CM

## 2011-12-20 DIAGNOSIS — N390 Urinary tract infection, site not specified: Secondary | ICD-10-CM

## 2011-12-20 DIAGNOSIS — R300 Dysuria: Secondary | ICD-10-CM | POA: Diagnosis not present

## 2011-12-20 DIAGNOSIS — R102 Pelvic and perineal pain: Secondary | ICD-10-CM

## 2011-12-20 LAB — POCT URINALYSIS DIP (DEVICE)
Bilirubin Urine: NEGATIVE
Glucose, UA: NEGATIVE mg/dL
Hgb urine dipstick: NEGATIVE
Ketones, ur: NEGATIVE mg/dL
Leukocytes, UA: NEGATIVE
Nitrite: NEGATIVE
Protein, ur: NEGATIVE mg/dL
Specific Gravity, Urine: >=1.03 (ref 1.005–1.030)
Urobilinogen, UA: 0.2 mg/dL (ref 0.0–1.0)
pH: 5.5 (ref 5.0–8.0)

## 2011-12-20 NOTE — Progress Notes (Signed)
S: Pt presents today c/o a burning sensation inside her abd when she urinates. She states the pain is not always present and she is not hurting now. She also c/o some urinary incontinence with valsalva. She denies vag dc or vag irritation. She denies vag bleeding, fever, or any other sx at this time. She states she has a hx of fibroids. O: VSS A&O x 3 in NAD Abd soft, non-tender to palpation NL external genitalia Bimanual exam reveal uterus to be non-tender. No evidence of large cystocele noted. No adnexal masses. No vag dc present. [name]: Abd pain: discussed with pt at length. Will send urine for culture and will get pelvic US. She will return to see a female provider per her choice in about 2wks. Discussed diet, activity, risks, and precautions.  Clinton Gallant. Jovahn Breit III, DrHSc, MPAS, PA-C

## 2011-12-22 ENCOUNTER — Ambulatory Visit (HOSPITAL_COMMUNITY)
Admission: RE | Admit: 2011-12-22 | Discharge: 2011-12-22 | Disposition: A | Discharge status: Home / Self Care | Payer: Medicare Other | Source: Ambulatory Visit | Attending: Obstetrics and Gynecology | Admitting: Obstetrics and Gynecology

## 2011-12-22 DIAGNOSIS — Z780 Asymptomatic menopausal state: Secondary | ICD-10-CM | POA: Insufficient documentation

## 2011-12-22 DIAGNOSIS — N949 Unspecified condition associated with female genital organs and menstrual cycle: Secondary | ICD-10-CM | POA: Diagnosis not present

## 2011-12-22 DIAGNOSIS — N390 Urinary tract infection, site not specified: Secondary | ICD-10-CM

## 2011-12-22 DIAGNOSIS — R102 Pelvic and perineal pain: Secondary | ICD-10-CM

## 2011-12-22 LAB — URINE CULTURE
Colony Count: NO GROWTH
Organism ID, Bacteria: NO GROWTH

## 2012-01-02 ENCOUNTER — Other Ambulatory Visit (HOSPITAL_COMMUNITY): Payer: Self-pay | Admitting: Family Medicine

## 2012-01-02 DIAGNOSIS — Z1231 Encounter for screening mammogram for malignant neoplasm of breast: Secondary | ICD-10-CM

## 2012-01-10 ENCOUNTER — Encounter: Payer: Self-pay | Admitting: Advanced Practice Midwife

## 2012-01-10 ENCOUNTER — Ambulatory Visit (INDEPENDENT_AMBULATORY_CARE_PROVIDER_SITE_OTHER): Payer: Medicare Other | Admitting: Advanced Practice Midwife

## 2012-01-10 VITALS — BP 110/69 | HR 64 | Temp 97.3°F | Resp 16 | Ht 60.0 in | Wt 122.3 lb

## 2012-01-10 DIAGNOSIS — R109 Unspecified abdominal pain: Secondary | ICD-10-CM

## 2012-01-10 DIAGNOSIS — R1030 Lower abdominal pain, unspecified: Secondary | ICD-10-CM | POA: Insufficient documentation

## 2012-01-10 NOTE — Progress Notes (Signed)
Subjective:    Patient ID: Autumn Fitzgerald, female    DOB: 1946-06-14, 66 y.o.   MRN: [id number]  HPI This is a 66 y.o. who presents for results from her ultrasound ordered by E. Rice.  She was seen by him for c/o a burning sensation inside her abd when she urinates. She states the pain is not always present and she is not hurting now. She also c/o some urinary incontinence with valsalva. She denies vag dc or vag irritation. She denies vag bleeding, fever, or any other sx at this time. She states she has a hx of fibroids.   Her exam was normal but he sent a urine culture and ordered a pelvic US. She states her paps have always been normal. She does have a medical doctor named Dr Rennis Golden.   She states she has been told she has IBS.  Also has gastritis and takes Protonix for that.  Review of Systems As above    Objective:   Physical Exam Exam deferred.  [place]:  US Transvaginal Non-ob  12/22/2011  *RADIOLOGY REPORT*  Clinical Data: Pelvic pain.  Postmenopausal  TRANSABDOMINAL AND TRANSVAGINAL ULTRASOUND OF PELVIS Technique:  Both transabdominal and transvaginal ultrasound examinations of the pelvis were performed. Transabdominal technique was performed for global imaging of the pelvis including uterus, ovaries, adnexal regions, and pelvic cul-de-sac.  Comparison: 08/11/2010   It was necessary to proceed with endovaginal exam following the transabdominal exam to visualize the endometrium and adnexa.  Findings:  Uterus: Demonstrates a sagittal length of 7.0 cm, AP depth of 3.0 cm and a transverse width of 3.5 cm.  A homogeneous myometrium is seen  Endometrium: Appears thin and echogenic with an AP width of 3.5 mm and this is within normal limits for a postmenopausal patient.  No areas of focal thickening or heterogeneity are noted  Right ovary:  Measures 2.0 x 1.6 x 1.3 cm and has a normal appearance  Left ovary: Measures 1.5 x 2.4 x 1.5 cm and has a normal appearance  Other findings: No pelvic fluid or  separate adnexal masses are seen  IMPRESSION: Normal postmenopausal uterine myometrium and endometrium.  Stable ovarian appearance and size with no focal abnormality seen.  Original Report Authenticated By: Bertha Stakes, M.D.   US Pelvis Complete  12/22/2011  *RADIOLOGY REPORT*  Clinical Data: Pelvic pain.  Postmenopausal  TRANSABDOMINAL AND TRANSVAGINAL ULTRASOUND OF PELVIS Technique:  Both transabdominal and transvaginal ultrasound examinations of the pelvis were performed. Transabdominal technique was performed for global imaging of the pelvis including uterus, ovaries, adnexal regions, and pelvic cul-de-sac.  Comparison: 08/11/2010   It was necessary to proceed with endovaginal exam following the transabdominal exam to visualize the endometrium and adnexa.  Findings:  Uterus: Demonstrates a sagittal length of 7.0 cm, AP depth of 3.0 cm and a transverse width of 3.5 cm.  A homogeneous myometrium is seen  Endometrium: Appears thin and echogenic with an AP width of 3.5 mm and this is within normal limits for a postmenopausal patient.  No areas of focal thickening or heterogeneity are noted  Right ovary:  Measures 2.0 x 1.6 x 1.3 cm and has a normal appearance  Left ovary: Measures 1.5 x 2.4 x 1.5 cm and has a normal appearance  Other findings: No pelvic fluid or separate adnexal masses are seen  IMPRESSION: Normal postmenopausal uterine myometrium and endometrium.  Stable ovarian appearance and size with no focal abnormality seen.  Original Report Authenticated By: Bertha Stakes, M.D.  UA was negative.     Assessment & Plan:  A:  Intermittent lower abdominal pain      States PAP already done by primary doctor      No evidence of fibroids or other gynecological pathology      Post Menopausal      Suspect IBS P:  Supportive care       I did suggest she start nightly fiber therapy with large glass of water.        She will follow up with her primary doctor.      Need copy of note faxed  to  her doctor at [facility], [phone]

## 2012-01-10 NOTE — Patient Instructions (Signed)
Irritable Bowel Syndrome Irritable Bowel Syndrome (IBS) is caused by a disturbance of normal bowel function. Other terms used are spastic colon, mucous colitis, and irritable colon. It does not require surgery, nor does it lead to cancer. There is no cure for IBS. But with proper diet, stress reduction, and medication, you will find that your problems (symptoms) will gradually disappear or improve. IBS is a common digestive disorder. It usually appears in late adolescence or early adulthood. Women develop it twice as often as men. CAUSES  After food has been digested and absorbed in the small intestine, waste material is moved into the colon (large intestine). In the colon, water and salts are absorbed from the undigested products coming from the small intestine. The remaining residue, or fecal material, is held for elimination. Under normal circumstances, gentle, rhythmic contractions on the bowel walls push the fecal material along the colon towards the rectum. In IBS, however, these contractions are irregular and poorly coordinated. The fecal material is either retained too long, resulting in constipation, or expelled too soon, producing diarrhea. SYMPTOMS  The most common symptom of IBS is pain. It is typically in the lower left side of the belly (abdomen). But it may occur anywhere in the abdomen. It can be felt as heartburn, backache, or even as a dull pain in the arms or shoulders. The pain comes from excessive bowel-muscle spasms and from the buildup of gas and fecal material in the colon. This pain:  Can range from sharp belly (abdominal) cramps to a dull, continuous ache.   Usually worsens soon after eating.   Is typically relieved by having a bowel movement or passing gas.  Abdominal pain is usually accompanied by constipation. But it may also produce diarrhea. The diarrhea typically occurs right after a meal or upon arising in the morning. The stools are typically soft and watery. They are  often flecked with secretions (mucus). Other symptoms of IBS include:  Bloating.   Loss of appetite.   Heartburn.   Feeling sick to your stomach (nausea).   Belching   Vomiting   Gas.  IBS may also cause a number of symptoms that are unrelated to the digestive system:  Fatigue.   Headaches.   Anxiety   Shortness of breath   Difficulty in concentrating.   Dizziness.  These symptoms tend to come and go. DIAGNOSIS  The symptoms of IBS closely mimic the symptoms of other, more serious digestive disorders. So your caregiver may wish to perform a variety of additional tests to exclude these disorders. He/she wants to be certain of learning what is wrong (diagnosis). The nature and purpose of each test will be explained to you. TREATMENT A number of medications are available to help correct bowel function and/or relieve bowel spasms and abdominal pain. Among the drugs available are:  Mild, non-irritating laxatives for severe constipation and to help restore normal bowel habits.   Specific anti-diarrheal medications to treat severe or prolonged diarrhea.   Anti-spasmodic agents to relieve intestinal cramps.   Your caregiver may also decide to treat you with a mild tranquilizer or sedative during unusually stressful periods in your life.  The important thing to remember is that if any drug is prescribed for you, make sure that you take it exactly as directed. Make sure that your caregiver knows how well it worked for you. HOME CARE INSTRUCTIONS   Avoid foods that are high in fat or oils. Some examples are:heavy cream, butter, frankfurters, sausage, and other fatty   meats.   Avoid foods that have a laxative effect, such as fruit, fruit juice, and dairy products.   Cut out carbonated drinks, chewing gum, and "gassy" foods, such as beans and cabbage. This may help relieve bloating and belching.   Bran taken with plenty of liquids may help relieve constipation.   Keep track of  what foods seem to trigger your symptoms.   Avoid emotionally charged situations or circumstances that produce anxiety.   Start or continue exercising.   Get plenty of rest and sleep.  MAKE SURE YOU:   Understand these instructions.   Will watch your condition.   Will get help right away if you are not doing well or get worse.  Document Released: 11/27/2005 Document Revised: 08/09/2011 Document Reviewed: 07/17/2008 [employer] Patient Information 2012 [employer]. 

## 2012-01-29 ENCOUNTER — Ambulatory Visit (HOSPITAL_COMMUNITY)
Admission: RE | Admit: 2012-01-29 | Discharge: 2012-01-29 | Disposition: A | Discharge status: Home / Self Care | Payer: Medicare Other | Source: Ambulatory Visit | Attending: Family Medicine | Admitting: Family Medicine

## 2012-01-29 DIAGNOSIS — Z1231 Encounter for screening mammogram for malignant neoplasm of breast: Secondary | ICD-10-CM

## 2012-02-04 ENCOUNTER — Other Ambulatory Visit: Payer: Self-pay

## 2012-02-04 ENCOUNTER — Emergency Department (HOSPITAL_COMMUNITY): Payer: Medicare Other

## 2012-02-04 ENCOUNTER — Encounter (HOSPITAL_COMMUNITY): Payer: Self-pay

## 2012-02-04 ENCOUNTER — Emergency Department (HOSPITAL_COMMUNITY)
Admission: EM | Admit: 2012-02-04 | Discharge: 2012-02-04 | Disposition: A | Discharge status: Home Health | Payer: Medicare Other | Attending: Emergency Medicine | Admitting: Emergency Medicine

## 2012-02-04 DIAGNOSIS — R079 Chest pain, unspecified: Secondary | ICD-10-CM | POA: Insufficient documentation

## 2012-02-04 DIAGNOSIS — J45909 Unspecified asthma, uncomplicated: Secondary | ICD-10-CM | POA: Insufficient documentation

## 2012-02-04 DIAGNOSIS — K219 Gastro-esophageal reflux disease without esophagitis: Secondary | ICD-10-CM | POA: Insufficient documentation

## 2012-02-04 DIAGNOSIS — E785 Hyperlipidemia, unspecified: Secondary | ICD-10-CM | POA: Insufficient documentation

## 2012-02-04 DIAGNOSIS — I499 Cardiac arrhythmia, unspecified: Secondary | ICD-10-CM | POA: Insufficient documentation

## 2012-02-04 DIAGNOSIS — R002 Palpitations: Secondary | ICD-10-CM | POA: Insufficient documentation

## 2012-02-04 LAB — COMPREHENSIVE METABOLIC PANEL
ALT: 29 U/L (ref 0–35)
AST: 28 U/L (ref 0–37)
Albumin: 3.5 g/dL (ref 3.5–5.2)
Alkaline Phosphatase: 73 U/L (ref 39–117)
BUN: 12 mg/dL (ref 6–23)
CO2: 28 mEq/L (ref 19–32)
Calcium: 9.3 mg/dL (ref 8.4–10.5)
Chloride: 106 mEq/L (ref 96–112)
Creatinine, Ser: 0.72 mg/dL (ref 0.50–1.10)
GFR calc Af Amer: >90 mL/min (ref >90)
GFR calc non Af Amer: 87 mL/min — ABNORMAL LOW (ref >90)
Glucose, Bld: 87 mg/dL (ref 70–99)
Potassium: 3.6 mEq/L (ref 3.5–5.1)
Sodium: 142 mEq/L (ref 135–145)
Total Bilirubin: 1.2 mg/dL (ref 0.3–1.2)
Total Protein: 6.9 g/dL (ref 6.0–8.3)

## 2012-02-04 LAB — CBC
HCT: 38.7 % (ref 36.0–46.0)
Hemoglobin: 13.1 g/dL (ref 12.0–15.0)
MCH: 29.4 pg (ref 26.0–34.0)
MCHC: 33.9 g/dL (ref 30.0–36.0)
MCV: 86.8 fL (ref 78.0–100.0)
Platelets: 211 10*3/uL (ref 150–400)
RBC: 4.46 MIL/uL (ref 3.87–5.11)
RDW: 13.2 % (ref 11.5–15.5)
WBC: 4.9 10*3/uL (ref 4.0–10.5)

## 2012-02-04 LAB — DIFFERENTIAL
Basophils Absolute: 0 10*3/uL (ref 0.0–0.1)
Basophils Relative: 0 % (ref 0–1)
Eosinophils Absolute: 0.1 10*3/uL (ref 0.0–0.7)
Eosinophils Relative: 1 % (ref 0–5)
Lymphocytes Relative: 35 % (ref 12–46)
Lymphs Abs: 1.7 10*3/uL (ref 0.7–4.0)
Monocytes Absolute: 0.4 10*3/uL (ref 0.1–1.0)
Monocytes Relative: 8 % (ref 3–12)
Neutro Abs: 2.7 10*3/uL (ref 1.7–7.7)
Neutrophils Relative %: 55 % (ref 43–77)

## 2012-02-04 LAB — T4: T4, Total: 8 ug/dL (ref 5.0–12.5)

## 2012-02-04 LAB — POCT I-STAT TROPONIN I: Troponin i, poc: 0 ng/mL (ref 0.00–0.08)

## 2012-02-04 LAB — TSH: TSH: 2.562 u[IU]/mL (ref 0.350–4.500)

## 2012-02-04 NOTE — Discharge Instructions (Signed)
Palpitations  A palpitation is the feeling that your heartbeat is irregular or is faster than normal. Although this is frightening, it usually is not serious. Palpitations may be caused by excesses of smoking, caffeine, or alcohol. They are also brought on by stress and anxiety. Sometimes, they are caused by heart disease. Unless otherwise noted, your caregiver did not find any signs of serious illness at this time. HOME CARE INSTRUCTIONS  To help prevent palpitations:  Drink decaffeinated coffee, tea, and soda pop. Avoid chocolate.   If you smoke or drink alcohol, quit or cut down as much as possible.   Reduce your stress or anxiety level. Biofeedback, yoga, or meditation will help you relax. Physical activity such as swimming, jogging, or walking also may be helpful.  SEEK MEDICAL CARE IF:   You continue to have a fast heartbeat.   Your palpitations occur more often.  SEEK IMMEDIATE MEDICAL CARE IF: You develop chest pain, shortness of breath, severe headache, dizziness, or fainting. Document Released: 11/24/2000 Document Revised: 08/09/2011 Document Reviewed: 01/24/2008 [facility] Patient Information 2012 [place], [place].    Followup with her cardiologist tomorrow. Return here for passing out or if you feel like you might pass out

## 2012-02-04 NOTE — ED Provider Notes (Signed)
History     CSN: [id number]  Arrival date & time 02/04/12  1425   First MD Initiated Contact with Patient 02/04/12 1505      Chief Complaint  Patient presents with  . Irregular Heart Beat    (Consider location/radiation/quality/duration/timing/severity/associated sxs/prior treatment) The history is provided by the patient and a relative.   patient presents with palpitations x1 week. Symptoms have not been associated with syncope or near syncope. History of similar in the past and she is on beta blockers for this. Denies any fever, dyspnea, cough. She does have constant right-sided chest pain x3 months. No change in her symptoms with that. Her chest pain is not anginal. Denies any bloody stools. Nothing makes her symptoms better or worse.  Past Medical History  Diagnosis Date  . Panic attacks   . Hypertrophic cardiomyopathy   . GERD (gastroesophageal reflux disease)   . HLD (hyperlipidemia)   . Asthma   . Palpitations     Past Surgical History  Procedure Date  . Coronary artery bypass graft   . Tubal ligation   . Cholecystectomy     Family History  Problem Relation Age of Onset  . Cancer Mother     breast  . Hypertension Mother   . Cancer Father     prostate    History  Substance Use Topics  . Smoking status: Never Smoker   . Smokeless tobacco: Not on file  . Alcohol Use: No    OB History    Grav Para Term Preterm Abortions TAB SAB Ect Mult Living   5 5 5       4       Review of Systems  All other systems reviewed and are negative.    Allergies  Review of patient's allergies indicates no known allergies.  Home Medications   Current Outpatient Rx  Name Route Sig Dispense Refill  . ATENOLOL 25 MG PO TABS Oral Take 25 mg by mouth daily.    . B COMPLEX-C PO TABS Oral Take 1 tablet by mouth daily.      Marland Kitchen CALCIUM-MAGNESIUM-ZINC 1000-400-15 MG PO TABS Oral Take 1 tablet by mouth 3 (three) times daily.      Marland Kitchen VITAMIN D 1000 UNITS PO TABS Oral Take 5,000  Units by mouth daily.      Marland Kitchen CITALOPRAM HYDROBROMIDE 20 MG PO TABS Oral Take 20 mg by mouth at bedtime.      Marland Kitchen CLONAZEPAM 2 MG PO TABS Oral Take 2 mg by mouth at bedtime.      Marland Kitchen EZETIMIBE-SIMVASTATIN 10-40 MG PO TABS Oral Take 0.5 tablets by mouth at bedtime.      . OMEGA-3 FATTY ACIDS 1000 MG PO CAPS Oral Take 1 g by mouth daily.      Marland Kitchen METOPROLOL TARTRATE 25 MG PO TABS Oral Take 12.5 mg by mouth 2 (two) times daily.    . OSTEO BI-FLEX ADV DOUBLE ST PO Oral Take 1 tablet by mouth 2 (two) times daily.      . MOMETASONE FUROATE 220 MCG/INH IN AEPB Inhalation Inhale 2 puffs into the lungs every morning.      Carma Leaven M PLUS PO TABS Oral Take 1 tablet by mouth daily.      Marland Kitchen PANTOPRAZOLE SODIUM 40 MG PO TBEC Oral Take 40 mg by mouth daily.      Marland Kitchen POLYETHYL GLYCOL-PROPYL GLYCOL 0.4-0.3 % OP SOLN Ophthalmic Apply 1 drop to eye 2 (two) times daily.  BP 144/78  Pulse 69  Temp(Src) 98.1 F (36.7 C) (Oral)  Resp 16  SpO2 98%  Physical Exam  Nursing note and vitals reviewed. Constitutional: She is oriented to person, place, and time. She appears well-developed and well-nourished.  Non-toxic appearance. No distress.  HENT:  Head: Normocephalic and atraumatic.  Eyes: Conjunctivae, EOM and lids are normal. Pupils are equal, round, and reactive to light.  Neck: Normal range of motion. Neck supple. No tracheal deviation present. No mass present.  Cardiovascular: Normal rate, regular rhythm and normal heart sounds.  Exam reveals no gallop.   No murmur heard. Pulmonary/Chest: Effort normal and breath sounds normal. No stridor. No respiratory distress. She has no decreased breath sounds. She has no wheezes. She has no rhonchi. She has no rales.  Abdominal: Soft. Normal appearance and bowel sounds are normal. She exhibits no distension. There is no tenderness. There is no rebound and no CVA tenderness.  Musculoskeletal: Normal range of motion. She exhibits no edema and no tenderness.  Neurological:  She is alert and oriented to person, place, and time. She has normal strength. No cranial nerve deficit or sensory deficit. GCS eye subscore is 4. GCS verbal subscore is 5. GCS motor subscore is 6.  Skin: Skin is warm and dry. No abrasion and no rash noted.  Psychiatric: She has a normal mood and affect. Her speech is normal and behavior is normal.    ED Course  Procedures (including critical care time)   Labs Reviewed  CBC  DIFFERENTIAL  COMPREHENSIVE METABOLIC PANEL  T4  TSH   No results found.   No diagnosis found.    MDM   Date: 02/04/2012  Rate: 68  Rhythm: normal sinus rhythm  QRS Axis: right  Intervals: normal  ST/T Wave abnormalities: nonspecific ST changes  Conduction Disutrbances:nonspecific intraventricular conduction delay  Narrative Interpretation:   Old EKG Reviewed: unchanged    5:57 PM Reexamine in stable at this time. She relates a long-standing history of palpitations. Lab and x-rays were negative here. She is encouraged to followup with her cardiologist tomorrow and given return instructions     Toy Baker, MD 02/04/12 1757

## 2012-02-04 NOTE — ED Notes (Addendum)
PT reports her  Heart is irreg. And pain in upper back and chest. Pt had  Heart surgery 56yrs. Ago for enlarged heart.

## 2012-03-05 DIAGNOSIS — J309 Allergic rhinitis, unspecified: Secondary | ICD-10-CM | POA: Diagnosis not present

## 2012-03-12 ENCOUNTER — Other Ambulatory Visit: Payer: Self-pay | Admitting: Internal Medicine

## 2012-03-12 DIAGNOSIS — R0789 Other chest pain: Secondary | ICD-10-CM

## 2012-03-14 ENCOUNTER — Ambulatory Visit
Admission: RE | Admit: 2012-03-14 | Discharge: 2012-03-14 | Disposition: A | Discharge status: Home / Self Care | Payer: Medicare Other | Source: Ambulatory Visit | Attending: Internal Medicine | Admitting: Internal Medicine

## 2012-03-14 ENCOUNTER — Other Ambulatory Visit: Payer: Self-pay | Admitting: Internal Medicine

## 2012-03-14 DIAGNOSIS — R0789 Other chest pain: Secondary | ICD-10-CM

## 2012-05-07 DIAGNOSIS — Z0000 Encounter for general adult medical examination without abnormal findings: Secondary | ICD-10-CM | POA: Diagnosis not present

## 2012-05-11 ENCOUNTER — Encounter (HOSPITAL_COMMUNITY): Payer: Self-pay

## 2012-05-11 ENCOUNTER — Emergency Department (HOSPITAL_COMMUNITY)
Admission: EM | Admit: 2012-05-11 | Discharge: 2012-05-11 | Disposition: A | Discharge status: Home / Self Care | Payer: Medicare Other | Attending: Emergency Medicine | Admitting: Emergency Medicine

## 2012-05-11 ENCOUNTER — Emergency Department (HOSPITAL_COMMUNITY): Payer: Medicare Other

## 2012-05-11 DIAGNOSIS — R110 Nausea: Secondary | ICD-10-CM | POA: Insufficient documentation

## 2012-05-11 DIAGNOSIS — Z79899 Other long term (current) drug therapy: Secondary | ICD-10-CM | POA: Insufficient documentation

## 2012-05-11 DIAGNOSIS — Z951 Presence of aortocoronary bypass graft: Secondary | ICD-10-CM | POA: Insufficient documentation

## 2012-05-11 DIAGNOSIS — J45909 Unspecified asthma, uncomplicated: Secondary | ICD-10-CM | POA: Insufficient documentation

## 2012-05-11 DIAGNOSIS — K219 Gastro-esophageal reflux disease without esophagitis: Secondary | ICD-10-CM | POA: Insufficient documentation

## 2012-05-11 DIAGNOSIS — M542 Cervicalgia: Secondary | ICD-10-CM | POA: Insufficient documentation

## 2012-05-11 DIAGNOSIS — I422 Other hypertrophic cardiomyopathy: Secondary | ICD-10-CM | POA: Insufficient documentation

## 2012-05-11 DIAGNOSIS — E785 Hyperlipidemia, unspecified: Secondary | ICD-10-CM | POA: Insufficient documentation

## 2012-05-11 DIAGNOSIS — I10 Essential (primary) hypertension: Secondary | ICD-10-CM | POA: Insufficient documentation

## 2012-05-11 DIAGNOSIS — R079 Chest pain, unspecified: Secondary | ICD-10-CM | POA: Insufficient documentation

## 2012-05-11 LAB — POCT I-STAT, CHEM 8
BUN: 10 mg/dL (ref 6–23)
Calcium, Ion: 1.25 mmol/L (ref 1.12–1.32)
Chloride: 103 mEq/L (ref 96–112)
Creatinine, Ser: 0.8 mg/dL (ref 0.50–1.10)
Glucose, Bld: 79 mg/dL (ref 70–99)
HCT: 42 % (ref 36.0–46.0)
Hemoglobin: 14.3 g/dL (ref 12.0–15.0)
Potassium: 4 mEq/L (ref 3.5–5.1)
Sodium: 143 mEq/L (ref 135–145)
TCO2: 29 mmol/L (ref 0–100)

## 2012-05-11 LAB — POCT I-STAT TROPONIN I: Troponin i, poc: 0.01 ng/mL (ref 0.00–0.08)

## 2012-05-11 MED ORDER — OXYCODONE-ACETAMINOPHEN 5-325 MG PO TABS: 2.0000 | ORAL_TABLET | ORAL | Status: AC | PRN

## 2012-05-11 MED ORDER — ONDANSETRON 4 MG PO TBDP
4.0000 mg | ORAL_TABLET | Freq: Once | ORAL | Status: AC
  Administered 2012-05-11: 4 mg via ORAL
  Filled 2012-05-11: qty 1

## 2012-05-11 NOTE — Discharge Instructions (Signed)
Your xray shows arthritis.  Your blood tests are normal. There is no damage to your heart.  Use percocet for pain.  Follow up with Dr. Duanne Guess for reevaluation. REturn for worse  Symptoms.

## 2012-05-11 NOTE — ED Provider Notes (Addendum)
History     CSN: [id number]  Arrival date & time 05/11/12  1323   First MD Initiated Contact with Patient 05/11/12 1327      Chief Complaint  Patient presents with  . Chest Pain  . Torticollis  . Nausea    (Consider location/radiation/quality/duration/timing/severity/associated sxs/prior treatment) Patient is a 66 y.o. female presenting with chest pain. The history is provided by the patient.  Chest Pain   the pt is a 22 y female with hx of hypertrophic cardiomyopathy.   She denies smoking, dm, or hx of cad.  She does have htn. She c/o nontraumatic neck pain for > 1 mo. She denies fever, st, weakness or paresthesias.   She c/o nausea.  This am, she felt "something" in her chest.  She insists there was " no pain."  Presently she has only neck pain and nausea. She does not want an analgesic.  She also says she feels her muscles "jumping."    Past Medical History  Diagnosis Date  . Panic attacks   . Hypertrophic cardiomyopathy   . GERD (gastroesophageal reflux disease)   . HLD (hyperlipidemia)   . Asthma   . Palpitations     Past Surgical History  Procedure Date  . Coronary artery bypass graft   . Tubal ligation   . Cholecystectomy     Family History  Problem Relation Age of Onset  . Cancer Mother     breast  . Hypertension Mother   . Cancer Father     prostate    History  Substance Use Topics  . Smoking status: Never Smoker   . Smokeless tobacco: Not on file  . Alcohol Use: No    OB History    Grav Para Term Preterm Abortions TAB SAB Ect Mult Living   5 5 5       4       Review of Systems  Cardiovascular: Positive for chest pain.    Allergies  Review of patient's allergies indicates no known allergies.  Home Medications   Current Outpatient Rx  Name Route Sig Dispense Refill  . B COMPLEX-C PO TABS Oral Take 1 tablet by mouth daily.      Marland Kitchen CALCIUM-MAGNESIUM-ZINC 1000-400-15 MG PO TABS Oral Take 1 tablet by mouth 3 (three) times daily.      Marland Kitchen  VITAMIN D 1000 UNITS PO TABS Oral Take 5,000 Units by mouth daily.      Marland Kitchen CITALOPRAM HYDROBROMIDE 20 MG PO TABS Oral Take 20 mg by mouth at bedtime.      Marland Kitchen CLONAZEPAM 2 MG PO TABS Oral Take 2 mg by mouth at bedtime.      Marland Kitchen EZETIMIBE-SIMVASTATIN 10-40 MG PO TABS Oral Take 0.5 tablets by mouth at bedtime.     . OMEGA-3 FATTY ACIDS 1000 MG PO CAPS Oral Take 1 g by mouth daily.      . OSTEO BI-FLEX ADV DOUBLE ST PO Oral Take 1 tablet by mouth 2 (two) times daily.      . MOMETASONE FUROATE 220 MCG/INH IN AEPB Inhalation Inhale 1 puff into the lungs daily.    Marland Kitchen PANTOPRAZOLE SODIUM 40 MG PO TBEC Oral Take 40 mg by mouth daily.      Marland Kitchen POLYETHYL GLYCOL-PROPYL GLYCOL 0.4-0.3 % OP SOLN Ophthalmic Apply 1 drop to eye 2 (two) times daily.       BP 140/76  Pulse 98  Temp(Src) 98 F (36.7 C) (Oral)  Resp 18  SpO2 98%  Physical Exam  Vitals reviewed. Constitutional: She is oriented to person, place, and time. She appears well-developed and well-nourished. No distress.  HENT:  Head: Normocephalic and atraumatic.  Eyes: Conjunctivae are normal.  Neck: Normal range of motion. Neck supple.  Cardiovascular: Normal rate.   Pulmonary/Chest: Effort normal.  Abdominal: She exhibits no distension.  Musculoskeletal: Normal range of motion. She exhibits no edema.  Neurological: She is alert and oriented to person, place, and time.  Skin: Skin is warm and dry.  Psychiatric: She has a normal mood and affect. Thought content normal.    ED Course  Procedures (including critical care time)  Labs Reviewed - No data to display No results found.   No diagnosis found.    MDM  Neck pain nausea        Cheri Guppy, MD 05/11/12 1506  Cheri Guppy, MD 08/12/12 [phone]

## 2012-05-11 NOTE — ED Notes (Signed)
Pt woke up before 7 am and was nauseated and had chest pain and neck pain.

## 2012-05-14 DIAGNOSIS — E039 Hypothyroidism, unspecified: Secondary | ICD-10-CM | POA: Insufficient documentation

## 2012-05-14 DIAGNOSIS — J309 Allergic rhinitis, unspecified: Secondary | ICD-10-CM | POA: Diagnosis not present

## 2012-05-14 DIAGNOSIS — E038 Other specified hypothyroidism: Secondary | ICD-10-CM | POA: Insufficient documentation

## 2012-08-27 DIAGNOSIS — J309 Allergic rhinitis, unspecified: Secondary | ICD-10-CM | POA: Diagnosis not present

## 2012-09-23 ENCOUNTER — Emergency Department (HOSPITAL_COMMUNITY): Payer: Medicare Other

## 2012-09-23 ENCOUNTER — Encounter (HOSPITAL_COMMUNITY): Payer: Self-pay | Admitting: Emergency Medicine

## 2012-09-23 ENCOUNTER — Emergency Department (HOSPITAL_COMMUNITY)
Admission: EM | Admit: 2012-09-23 | Discharge: 2012-09-23 | Disposition: A | Discharge status: Home / Self Care | Payer: Medicare Other | Attending: Emergency Medicine | Admitting: Emergency Medicine

## 2012-09-23 DIAGNOSIS — F411 Generalized anxiety disorder: Secondary | ICD-10-CM | POA: Insufficient documentation

## 2012-09-23 DIAGNOSIS — Z951 Presence of aortocoronary bypass graft: Secondary | ICD-10-CM | POA: Insufficient documentation

## 2012-09-23 DIAGNOSIS — R42 Dizziness and giddiness: Secondary | ICD-10-CM

## 2012-09-23 DIAGNOSIS — R079 Chest pain, unspecified: Secondary | ICD-10-CM | POA: Insufficient documentation

## 2012-09-23 DIAGNOSIS — R0789 Other chest pain: Secondary | ICD-10-CM | POA: Diagnosis not present

## 2012-09-23 DIAGNOSIS — F419 Anxiety disorder, unspecified: Secondary | ICD-10-CM

## 2012-09-23 LAB — COMPREHENSIVE METABOLIC PANEL
ALT: 16 U/L (ref 0–35)
AST: 29 U/L (ref 0–37)
Albumin: 4.4 g/dL (ref 3.5–5.2)
Alkaline Phosphatase: 91 U/L (ref 39–117)
BUN: 10 mg/dL (ref 6–23)
CO2: 29 mEq/L (ref 19–32)
Calcium: 9.6 mg/dL (ref 8.4–10.5)
Chloride: 100 mEq/L (ref 96–112)
Creatinine, Ser: 0.67 mg/dL (ref 0.50–1.10)
GFR calc Af Amer: >90 mL/min (ref >90)
GFR calc non Af Amer: 89 mL/min — ABNORMAL LOW (ref >90)
Glucose, Bld: 75 mg/dL (ref 70–99)
Potassium: 3.9 mEq/L (ref 3.5–5.1)
Sodium: 139 mEq/L (ref 135–145)
Total Bilirubin: 1.2 mg/dL (ref 0.3–1.2)
Total Protein: 8 g/dL (ref 6.0–8.3)

## 2012-09-23 LAB — CBC WITH DIFFERENTIAL/PLATELET
Basophils Absolute: 0 10*3/uL (ref 0.0–0.1)
Basophils Relative: 0 % (ref 0–1)
Eosinophils Absolute: 0 10*3/uL (ref 0.0–0.7)
Eosinophils Relative: 1 % (ref 0–5)
HCT: 43.1 % (ref 36.0–46.0)
Hemoglobin: 14.4 g/dL (ref 12.0–15.0)
Lymphocytes Relative: 31 % (ref 12–46)
Lymphs Abs: 2 10*3/uL (ref 0.7–4.0)
MCH: 28.9 pg (ref 26.0–34.0)
MCHC: 33.4 g/dL (ref 30.0–36.0)
MCV: 86.4 fL (ref 78.0–100.0)
Monocytes Absolute: 0.5 10*3/uL (ref 0.1–1.0)
Monocytes Relative: 8 % (ref 3–12)
Neutro Abs: 3.9 10*3/uL (ref 1.7–7.7)
Neutrophils Relative %: 60 % (ref 43–77)
Platelets: 278 10*3/uL (ref 150–400)
RBC: 4.99 MIL/uL (ref 3.87–5.11)
RDW: 12.8 % (ref 11.5–15.5)
WBC: 6.6 10*3/uL (ref 4.0–10.5)

## 2012-09-23 LAB — RAPID URINE DRUG SCREEN, HOSP PERFORMED
Amphetamines: NOT DETECTED
Barbiturates: NOT DETECTED
Benzodiazepines: NOT DETECTED
Cocaine: NOT DETECTED
Opiates: NOT DETECTED
Tetrahydrocannabinol: NOT DETECTED

## 2012-09-23 LAB — URINALYSIS, ROUTINE W REFLEX MICROSCOPIC
Bilirubin Urine: NEGATIVE
Glucose, UA: NEGATIVE mg/dL
Hgb urine dipstick: NEGATIVE
Ketones, ur: NEGATIVE mg/dL
Nitrite: NEGATIVE
Protein, ur: NEGATIVE mg/dL
Specific Gravity, Urine: 1.007 (ref 1.005–1.030)
Urobilinogen, UA: 0.2 mg/dL (ref 0.0–1.0)
pH: 6.5 (ref 5.0–8.0)

## 2012-09-23 LAB — POCT I-STAT TROPONIN I: Troponin i, poc: 0 ng/mL (ref 0.00–0.08)

## 2012-09-23 LAB — URINE MICROSCOPIC-ADD ON

## 2012-09-23 LAB — ETHANOL: Alcohol, Ethyl (B): <11 mg/dL (ref 0–11)

## 2012-09-23 MED ORDER — SODIUM CHLORIDE 0.9 % IV BOLUS (SEPSIS)
500.0000 mL | Freq: Once | INTRAVENOUS | Status: AC
  Administered 2012-09-23: 500 mL via INTRAVENOUS

## 2012-09-23 NOTE — ED Provider Notes (Signed)
History     CSN: [id number]  Arrival date & time 09/23/12  1356   First MD Initiated Contact with Patient 09/23/12 1547      Chief Complaint  Patient presents with  . Fatigue  . Dizziness    (Consider location/radiation/quality/duration/timing/severity/associated sxs/prior treatment) HPI Pt suffers from anxiety/panic attacks and is followed by psychiatrist. She is currently prescribed clonidine and celexa and she believes she needs stronger antianxiety medications. This is her primary complaints. She further states that she is getting lightheaded, fatigued, and has ongoing chest discomfort. States the chest pain has been more constant for the past few days and associated with deep breathing and anxiety. No cough, fever chills.   Past Medical History  Diagnosis Date  . Panic attacks   . Hypertrophic cardiomyopathy   . GERD (gastroesophageal reflux disease)   . HLD (hyperlipidemia)   . Asthma   . Palpitations     Past Surgical History  Procedure Date  . Coronary artery bypass graft   . Tubal ligation   . Cholecystectomy     Family History  Problem Relation Age of Onset  . Cancer Mother     breast  . Hypertension Mother   . Cancer Father     prostate    History  Substance Use Topics  . Smoking status: Never Smoker   . Smokeless tobacco: Not on file  . Alcohol Use: No    OB History    Grav Para Term Preterm Abortions TAB SAB Ect Mult Living   5 5 5       4       Review of Systems  Constitutional: Positive for fatigue. Negative for fever and chills.  HENT: Negative for neck pain and neck stiffness.   Respiratory: Positive for chest tightness. Negative for cough, shortness of breath and wheezing.   Cardiovascular: Positive for chest pain. Negative for palpitations and leg swelling.  Gastrointestinal: Negative for nausea, vomiting, abdominal pain and diarrhea.  Genitourinary: Negative for dysuria, frequency, flank pain and enuresis.  Skin: Negative for rash  and wound.  Neurological: Positive for dizziness and light-headedness. Negative for tremors, seizures, syncope, weakness, numbness and headaches.  Psychiatric/Behavioral: Positive for disturbed wake/sleep cycle. The patient is nervous/anxious.     Allergies  Review of patient's allergies indicates no known allergies.  Home Medications   Current Outpatient Rx  Name Route Sig Dispense Refill  . ACETAMINOPHEN 325 MG PO TABS Oral Take 325 mg by mouth every 6 (six) hours as needed.    . B COMPLEX-C PO TABS Oral Take 1 tablet by mouth daily.      Marland Kitchen CALCIUM-MAGNESIUM-ZINC 1000-400-15 MG PO TABS Oral Take 1 tablet by mouth 3 (three) times daily.      Marland Kitchen VITAMIN D 1000 UNITS PO TABS Oral Take 5,000 Units by mouth daily.      Marland Kitchen CITALOPRAM HYDROBROMIDE 20 MG PO TABS Oral Take 20 mg by mouth at bedtime.      Marland Kitchen CLONAZEPAM 2 MG PO TABS Oral Take 2 mg by mouth at bedtime.      Marland Kitchen EZETIMIBE-SIMVASTATIN 10-40 MG PO TABS Oral Take 0.5 tablets by mouth at bedtime.     . OMEGA-3 FATTY ACIDS 1000 MG PO CAPS Oral Take 1 g by mouth daily.      . OSTEO BI-FLEX ADV DOUBLE ST PO Oral Take 1 tablet by mouth 2 (two) times daily.      . MOMETASONE FUROATE 220 MCG/INH IN AEPB Inhalation Inhale 1 puff into  the lungs daily.    Marland Kitchen PANTOPRAZOLE SODIUM 40 MG PO TBEC Oral Take 40 mg by mouth daily.      Marland Kitchen POLYETHYL GLYCOL-PROPYL GLYCOL 0.4-0.3 % OP SOLN Ophthalmic Apply 1 drop to eye 2 (two) times daily.       BP 131/87  Pulse 79  Temp 98.3 F (36.8 C) (Oral)  Resp 17  Ht 5' (1.524 m)  Wt 125 lb (56.7 kg)  BMI 24.41 kg/m2  SpO2 100%  Physical Exam  Nursing note and vitals reviewed. Constitutional: She is oriented to person, place, and time. She appears well-developed and well-nourished.  HENT:  Head: Normocephalic and atraumatic.  Mouth/Throat: Oropharynx is clear and moist.  Eyes: EOM are normal. Pupils are equal, round, and reactive to light.  Neck: Normal range of motion. Neck supple. No thyromegaly present.    Cardiovascular: Normal rate and regular rhythm.   Pulmonary/Chest: Effort normal and breath sounds normal. No respiratory distress. She has no wheezes. She has no rales. She exhibits no tenderness.  Abdominal: Soft. Bowel sounds are normal. She exhibits no distension. There is no tenderness. There is no rebound and no guarding.  Musculoskeletal: Normal range of motion. She exhibits no edema and no tenderness.  Neurological: She is alert and oriented to person, place, and time.       5/5 motor, sensation intact  Skin: Skin is warm and dry. No rash noted. No erythema.  Psychiatric:       Anxious appearing    ED Course  Procedures (including critical care time)  Labs Reviewed  COMPREHENSIVE METABOLIC PANEL - Abnormal; Notable for the following:    GFR calc non Af Amer 89 (*)     All other components within normal limits  URINALYSIS, ROUTINE W REFLEX MICROSCOPIC - Abnormal; Notable for the following:    Leukocytes, UA TRACE (*)     All other components within normal limits  CBC WITH DIFFERENTIAL  URINE RAPID DRUG SCREEN (HOSP PERFORMED)  ETHANOL  POCT I-STAT TROPONIN I  URINE MICROSCOPIC-ADD ON   Dg Chest 2 View  09/23/2012  *RADIOLOGY REPORT*  Clinical Data: Chest pain, hypertrophic cardiomyopathy  CHEST - 2 VIEW  Comparison: 02/03/2003  Findings: Normal heart size post median sternotomy. Mediastinal contours and pulmonary vascularity normal. Lungs clear. No pleural effusion or pneumothorax. Bones appear diffusely demineralized.  IMPRESSION: No acute abnormalities.   Original Report Authenticated By: Lollie Marrow, M.D.      1. Anxiety disorder   2. Atypical chest pain   3. Dizziness       Date: 09/23/2012  Rate: 85  Rhythm: normal sinus rhythm  QRS Axis: right  Intervals: QRS prolonged  ST/T Wave abnormalities: nonspecific T wave changes  Conduction Disutrbances:left bundle branch block  Narrative Interpretation:   Old EKG Reviewed: unchanged   MDM  Symptoms likely  related to underlying psychiatric disorder. However, given risk factor and other medical issues will screen for other causes. Pt states she will see her PMD tomorrow to assess thyroid hormone levels.   Pt asking to be d/c'd. Denies prev CABG or CAD. Workup is negative. Given atypical CP, neg trop for 3 days of chest pain and another very likely explanation for her symptoms, I believe pt is safe to f/u with PMD tomorrow. Pt advised to return immediately for worsening symptoms or any concerns      Loren Racer, MD 09/23/12 1929

## 2012-09-23 NOTE — ED Notes (Signed)
Patient states that she's been experiencing weakness and dizziness x 1 week.  Patient has hypothyroidism and depression, and thinks the medicines she takes are causing this.

## 2012-09-23 NOTE — ED Notes (Signed)
Pt verbalizes understanding 

## 2012-09-23 NOTE — ED Notes (Signed)
Pt reports hx of hypothyroid. Pt also has had heart surgery in past. Pt reports she is going to see her thyroid dr tom. Pt has been having anxiety attacks. 1 year ago pt was admitted to [facility] for depression. Pt son reports that pt has told them she is depressed when she talks to them on the phone. Pt reports she has been having anxiety attacks for 1 week. But called her psychiatrist today because the panic attacks got worse. psychiatrist is out of town. Pt began crying when rn asked about depression. Pt denies SI.

## 2012-09-23 NOTE — Discharge Instructions (Signed)
Anxiety and Panic Attacks Your caregiver has informed you that you are having an anxiety or panic attack. There may be many forms of this. Most of the time these attacks come suddenly and without warning. They come at any time of day, including periods of sleep, and at any time of life. They may be strong and unexplained. Although panic attacks are very scary, they are physically harmless. Sometimes the cause of your anxiety is not known. Anxiety is a protective mechanism of the body in its fight or flight mechanism. Most of these perceived danger situations are actually nonphysical situations (such as anxiety over losing a job). CAUSES  The causes of an anxiety or panic attack are many. Panic attacks may occur in otherwise healthy people given a certain set of circumstances. There may be a genetic cause for panic attacks. Some medications may also have anxiety as a side effect. SYMPTOMS  Some of the most common feelings are:  Intense terror.  Dizziness, feeling faint.  Hot and cold flashes.  Fear of going crazy.  Feelings that nothing is real.  Sweating.  Shaking.  Chest pain or a fast heartbeat (palpitations).  Smothering, choking sensations.  Feelings of impending doom and that death is near.  Tingling of extremities, this may be from over-breathing.  Altered reality (derealization).  Being detached from yourself (depersonalization). Several symptoms can be present to make up anxiety or panic attacks. DIAGNOSIS  The evaluation by your caregiver will depend on the type of symptoms you are experiencing. The diagnosis of anxiety or panic attack is made when no physical illness can be determined to be a cause of the symptoms. TREATMENT  Treatment to prevent anxiety and panic attacks may include:  Avoidance of circumstances that cause anxiety.  Reassurance and relaxation.  Regular exercise.  Relaxation therapies, such as yoga.  Psychotherapy with a psychiatrist or  therapist.  Avoidance of caffeine, alcohol and illegal drugs.  Prescribed medication. SEEK IMMEDIATE MEDICAL CARE IF:   You experience panic attack symptoms that are different than your usual symptoms.  You have any worsening or concerning symptoms. Document Released: 11/27/2005 Document Revised: 02/19/2012 Document Reviewed: 03/31/2010 [facility] Patient Information 2013 [place], [place].  Dizziness  Dizziness means you feel unsteady or lightheaded. You might feel like you are going to pass out (faint). HOME CARE   Drink enough fluids to keep your pee (urine) clear or pale yellow.  Take your medicines exactly as told by your doctor. If you take blood pressure medicine, always stand up slowly from the lying or sitting position. Hold on to something to steady yourself.  If you need to stand in one place for a long time, move your legs often. Tighten and relax your leg muscles.  Have someone stay with you until you feel okay.  Do not drive or use heavy machinery if you feel dizzy.  Do not drink alcohol. GET HELP RIGHT AWAY IF:   You feel dizzy or lightheaded and it gets worse.  You feel sick to your stomach (nauseous), or you throw up (vomit).  You have trouble talking or walking.  You feel weak or have trouble using your arms, hands, or legs.  You cannot think clearly or have trouble forming sentences.  You have chest pain, belly (abdominal) pain, sweating, or you are short of breath.  Your vision changes.  You are bleeding.  You have problems from your medicine that seem to be getting worse. MAKE SURE YOU:   Understand these instructions.  Will  watch your condition.  Will get help right away if you are not doing well or get worse. Document Released: 11/16/2011 Document Revised: 02/19/2012 Document Reviewed: 11/16/2011 [facility] Patient Information 2013 [place], [place].

## 2012-09-27 ENCOUNTER — Inpatient Hospital Stay (HOSPITAL_COMMUNITY)
Admission: RE | Admit: 2012-09-27 | Discharge: 2012-09-30 | DRG: 880 | Disposition: A | Discharge status: Home / Self Care | Payer: Medicare Other | Attending: Psychiatry | Admitting: Psychiatry

## 2012-09-27 ENCOUNTER — Encounter (HOSPITAL_COMMUNITY): Payer: Self-pay | Admitting: *Deleted

## 2012-09-27 DIAGNOSIS — E785 Hyperlipidemia, unspecified: Secondary | ICD-10-CM | POA: Diagnosis present

## 2012-09-27 DIAGNOSIS — J45909 Unspecified asthma, uncomplicated: Secondary | ICD-10-CM | POA: Diagnosis present

## 2012-09-27 DIAGNOSIS — Z79899 Other long term (current) drug therapy: Secondary | ICD-10-CM

## 2012-09-27 DIAGNOSIS — F410 Panic disorder [episodic paroxysmal anxiety] without agoraphobia: Secondary | ICD-10-CM | POA: Diagnosis present

## 2012-09-27 DIAGNOSIS — I422 Other hypertrophic cardiomyopathy: Secondary | ICD-10-CM | POA: Diagnosis present

## 2012-09-27 DIAGNOSIS — K219 Gastro-esophageal reflux disease without esophagitis: Secondary | ICD-10-CM | POA: Diagnosis present

## 2012-09-27 MED ORDER — ACETAMINOPHEN 325 MG PO TABS: 650.0000 mg | ORAL_TABLET | Freq: Four times a day (QID) | ORAL | Status: DC | PRN

## 2012-09-27 MED ORDER — HYDROXYZINE HCL 50 MG PO TABS
50.0000 mg | ORAL_TABLET | Freq: Three times a day (TID) | ORAL | Status: DC | PRN
  Administered 2012-09-27 (×2): 50 mg via ORAL
  Filled 2012-09-27: qty 1

## 2012-09-27 MED ORDER — ALUM & MAG HYDROXIDE-SIMETH 200-200-20 MG/5ML PO SUSP: 30.0000 mL | ORAL | Status: DC | PRN

## 2012-09-27 MED ORDER — MAGNESIUM HYDROXIDE 400 MG/5ML PO SUSP: 30.0000 mL | Freq: Every day | ORAL | Status: DC | PRN

## 2012-09-27 MED ORDER — TRAZODONE HCL 50 MG PO TABS
50.0000 mg | ORAL_TABLET | Freq: Every day | ORAL | Status: DC
  Administered 2012-09-27 – 2012-09-29 (×3): 50 mg via ORAL
  Filled 2012-09-27 (×7): qty 1

## 2012-09-27 MED ORDER — PROPYLENE GLYCOL 0.6 % OP SOLN
1.0000 [drp] | Freq: Four times a day (QID) | OPHTHALMIC | Status: DC | PRN
  Administered 2012-09-28: 1 [drp] via OPHTHALMIC

## 2012-09-27 MED ORDER — MOMETASONE FUROATE 220 MCG/INH IN AEPB
2.0000 | INHALATION_SPRAY | Freq: Every day | RESPIRATORY_TRACT | Status: DC
  Administered 2012-09-28 – 2012-09-30 (×3): 2 via RESPIRATORY_TRACT

## 2012-09-27 NOTE — Tx Team (Signed)
Initial Interdisciplinary Treatment Plan  PATIENT STRENGTHS: (choose at least two) Average or above average intelligence Capable of independent living General fund of knowledge Physical Health  PATIENT STRESSORS: Medication change or noncompliance   PROBLEM LIST: Problem List/Patient Goals Date to be addressed Date deferred Reason deferred Estimated date of resolution                                                         DISCHARGE CRITERIA:  Improved stabilization in mood, thinking, and/or behavior Medical problems require only outpatient monitoring Need for constant or close observation no longer present  PRELIMINARY DISCHARGE PLAN: Attend aftercare/continuing care group Outpatient therapy  PATIENT/FAMIILY INVOLVEMENT: This treatment plan has been presented to and reviewed with the patient, Autumn Fitzgerald.  The patient and family have been given the opportunity to ask questions and make suggestions.  [name] M 09/27/2012, 3:36 PM

## 2012-09-27 NOTE — Progress Notes (Signed)
[facility] Group Notes:  (Counselor/Nursing/MHT/Case Management/Adjunct)  09/27/2012 11:57 PM  Type of Therapy:  Psychoeducational Skills  Participation Level:  Active  Participation Quality:  Appropriate  Affect:  Depressed  Cognitive:  Appropriate  Insight:  Limited  Engagement in Group:  Good  Engagement in Therapy:  Good  Modes of Intervention:  Education  Summary of Progress/Problems:The patient verbalized that she had a good day since she is feeling better. She would not explain any further as to how her day went. Her goal for tomorrow is to focus on "getting better".    Hazle Coca S 09/27/2012, 11:57 PM

## 2012-09-27 NOTE — Progress Notes (Signed)
Patient ID: Autumn Fitzgerald, female   DOB: Jan 20, 1946, 66 y.o.   MRN: [id number] Patient denies SI/HI and visual hallucinations and hears some ringings at night that is not there at night; patient came and stated that the doctor stop her klonopin and her celexa and started her on prozac last Tuesday; patient stated that she has been having anxiety attacks; patient has two medications that were reviewed by pharmacy; patient belongings are in locker 23; patient's skin assessment is within normal limits;
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
## 2012-09-28 DIAGNOSIS — F339 Major depressive disorder, recurrent, unspecified: Secondary | ICD-10-CM

## 2012-09-28 DIAGNOSIS — F410 Panic disorder [episodic paroxysmal anxiety] without agoraphobia: Principal | ICD-10-CM

## 2012-09-28 MED ORDER — PANTOPRAZOLE SODIUM 40 MG PO TBEC
40.0000 mg | DELAYED_RELEASE_TABLET | Freq: Every day | ORAL | Status: DC
  Administered 2012-09-28 – 2012-09-30 (×3): 40 mg via ORAL
  Filled 2012-09-28 (×5): qty 1

## 2012-09-28 MED ORDER — EZETIMIBE-SIMVASTATIN 10-40 MG PO TABS: 0.5000 | ORAL_TABLET | Freq: Every day | ORAL | Status: DC

## 2012-09-28 MED ORDER — EZETIMIBE 10 MG PO TABS
5.0000 mg | ORAL_TABLET | Freq: Every day | ORAL | Status: DC
  Administered 2012-09-28 – 2012-09-29 (×2): 5 mg via ORAL
  Filled 2012-09-28 (×4): qty 0.5

## 2012-09-28 MED ORDER — FLUOXETINE HCL 20 MG PO CAPS
40.0000 mg | ORAL_CAPSULE | Freq: Every day | ORAL | Status: DC
  Administered 2012-09-28 – 2012-09-30 (×3): 40 mg via ORAL
  Filled 2012-09-28 (×6): qty 2

## 2012-09-28 MED ORDER — SIMVASTATIN 20 MG PO TABS
20.0000 mg | ORAL_TABLET | Freq: Every day | ORAL | Status: DC
  Administered 2012-09-28 – 2012-09-29 (×2): 20 mg via ORAL
  Filled 2012-09-28 (×4): qty 1

## 2012-09-28 MED ORDER — LORAZEPAM 1 MG PO TABS
1.0000 mg | ORAL_TABLET | Freq: Four times a day (QID) | ORAL | Status: DC | PRN
  Administered 2012-09-29: 1 mg via ORAL
  Filled 2012-09-28: qty 1

## 2012-09-28 MED ORDER — QUETIAPINE FUMARATE 50 MG PO TABS
50.0000 mg | ORAL_TABLET | Freq: Every evening | ORAL | Status: DC | PRN
  Administered 2012-09-28 – 2012-09-29 (×2): 50 mg via ORAL
  Filled 2012-09-28 (×9): qty 1

## 2012-09-28 MED ORDER — LEVOTHYROXINE SODIUM 50 MCG PO TABS
50.0000 ug | ORAL_TABLET | Freq: Every day | ORAL | Status: DC
  Administered 2012-09-28 – 2012-09-30 (×2): 50 ug via ORAL
  Filled 2012-09-28 (×5): qty 1

## 2012-09-28 MED ORDER — FLUTICASONE PROPIONATE HFA 44 MCG/ACT IN AERO
2.0000 | INHALATION_SPRAY | Freq: Two times a day (BID) | RESPIRATORY_TRACT | Status: DC
  Filled 2012-09-28: qty 10.6

## 2012-09-28 MED ORDER — BUSPIRONE HCL 5 MG PO TABS
5.0000 mg | ORAL_TABLET | Freq: Three times a day (TID) | ORAL | Status: DC
  Administered 2012-09-28 – 2012-09-30 (×7): 5 mg via ORAL
  Filled 2012-09-28 (×12): qty 1

## 2012-09-28 MED ORDER — CITALOPRAM HYDROBROMIDE 20 MG PO TABS
20.0000 mg | ORAL_TABLET | Freq: Every day | ORAL | Status: DC
  Administered 2012-09-28 – 2012-09-29 (×2): 20 mg via ORAL
  Filled 2012-09-28 (×4): qty 1

## 2012-09-28 MED ORDER — ACETAMINOPHEN 325 MG PO TABS: 325.0000 mg | ORAL_TABLET | Freq: Four times a day (QID) | ORAL | Status: DC | PRN
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
## 2012-09-28 NOTE — Progress Notes (Signed)
[facility] Group Notes:  (Counselor/Nursing/MHT/Case Management/Adjunct)  09/28/2012 11:11 AM  Type of Therapy:  After Care Planning  Pt. participated in after care planning group and was given Suicide Prevention Pamphlet and crisis numbers and agreed to use them if needed. The pt. States she is having problems with dizzines from sleeping medication. Pt. Denies SI/HI and states her anxiety is at a 7 and reports no problems with depression.  Autumn Fitzgerald 09/28/2012, 11:11 AM

## 2012-09-28 NOTE — Progress Notes (Deleted)
ANTICOAGULATION CONSULT NOTE - Initial Consult  Pharmacy Consult for Coumadin Indication: h/o pulmonary embolus  No Known Allergies  Patient Measurements: Height: 5' (152.4 cm) Weight: 121 lb (54.885 kg) IBW/kg (Calculated) : 45.5    Vital Signs: Temp: 97.9 F (36.6 C) (10/19 0700) BP: 110/73 mmHg (10/19 0701) Pulse Rate: 108  (10/19 0701)  Labs: H/H 14.4/43.1 Pltc 278 Crea 0.67 Estimated Creatinine Clearance: 53.1 ml/min (by C-G formula based on Cr of 0.67).   Medical History: Past Medical History  Diagnosis Date  . Panic attacks   . Hypertrophic cardiomyopathy   . GERD (gastroesophageal reflux disease)   . HLD (hyperlipidemia)   . Asthma   . Palpitations     Medications:  Scheduled:    . citalopram  20 mg Oral QHS  . ezetimibe  5 mg Oral QHS   And  . simvastatin  20 mg Oral QHS  . FLUoxetine  40 mg Oral Daily  . levothyroxine  50 mcg Oral Daily  . mometasone  2 puff Inhalation Daily  . pantoprazole  40 mg Oral Daily  . traZODone  50 mg Oral QHS  . DISCONTD: ezetimibe-simvastatin  0.5 tablet Oral QHS  . DISCONTD: fluticasone  2 puff Inhalation BID    Assessment: Patient has been in [facility] ER for 2 days with no PT?INR drawn.  Restarting Coumadin as patient has been on Coumadin in past month for H/O PE.  Lab unable to draw PT/INR today as it is not the policy to draw stat labs during day.  Will not send patient to ER for lab as cost associated with this and knowing patient has not had any coumadin x 2 days while in ER.  Coumadin home dose is Coumadin 10 mg daily.  No bleeding noted.  Goal of Therapy:  INR 2-3    Plan:  Coumadin 10 mg x today.  PT/INR in am.  Reviewed coumadin with patient.    Charyl Dancer 09/28/2012,10:39 AM

## 2012-09-28 NOTE — Progress Notes (Signed)
[facility] Group Notes:  (Counselor/Nursing/MHT/Case Management/Adjunct)  09/28/2012 4:18 PM  Type of Therapy:  Group Therapy  Participation Level:  Active  Participation Quality:  Appropriate  Affect:  Appropriate  Cognitive:  Appropriate  Insight:  Good  Engagement in Group:  Good  Engagement in Therapy:  Good  Modes of Intervention:  Support, Socialization, Clarification  Summary of Progress/Problems:Pt. participated in group on self sabotaging behaviors and what changes they need to make in their lives. The group focused on changing the way they think and being positive.   Pt. Spoke about feeling like red. The pt. Spoke about being isolated due to living in [place] and moving to [place]. Pt. Was encouraged to attend support groups.  Lamar Blinks [place] 09/28/2012, 4:18 PM
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
## 2012-09-28 NOTE — Progress Notes (Signed)
Patient ID: Autumn Fitzgerald, female   DOB: 06-Aug-1946, 66 y.o.   MRN: [id number]   D: Patient awake when doing checks. States that she has already had 2 sleeping pills but still cannot sleep. Patient asked for her "panic attack"medication. Patient meaning the medicine for anxiety. Vistaril given at this time.  A: Staff will monitor q 15 minutes and follow treatments and give medications as ordered. R: Patient went back to bed at this time.

## 2012-09-28 NOTE — Progress Notes (Signed)
Autumn Fitzgerald is seen out in the milieu...she interacts well with  Other pts. SHe takes her meds as ordered, she is attending her groups and she completed her AM self inventory and on it she wrote she deied SI within the past 24 hrs, she rated her depression a "7' and stated her DC plan is to " go out more".  A She is trying to learn healthier ways to live and to cope and she speaks several times to day with this writer on how she can liver more healthy.  R Safety is in place and POC cont with therapeutic relationship fostered PD RN [facility]

## 2012-09-28 NOTE — Progress Notes (Signed)
[facility] Group Notes:  (Counselor/Nursing/MHT/Case Management/Adjunct)  09/28/2012 10:12 PM  Type of Therapy:  Psychoeducational Skills  Participation Level:  Active  Participation Quality:  Appropriate  Affect:  Appropriate  Cognitive:  Appropriate  Insight:  Good  Engagement in Group:  Good  Engagement in Therapy:  Good  Modes of Intervention:  Education  Summary of Progress/Problems: The patient verbalized in group that she had a good day. She states that she felt less "panicked" as compared to yesterday. She does however complain of dizziness. Her goal for tomorrow is to work on getting better.    Christyl Osentoski S 09/28/2012, 10:12 PM

## 2012-09-29 LAB — PROTIME-INR
INR: 0.89 (ref 0.00–1.49)
Prothrombin Time: 12 seconds (ref 11.6–15.2)

## 2012-09-29 NOTE — Progress Notes (Signed)
[facility] Group Notes:  (Counselor/Nursing/MHT/Case Management/Adjunct)  09/29/2012 10:44 AM  Type of Therapy: Group Therapy   Participation Level: Active   Participation Quality: Appropriate   Affect: Appropriate   Cognitive: Appropriate   Insight: Good   Engagement in Group: Good   Engagement in Therapy: Good   Modes of Intervention: Support, Socialization, Clarification  Summary of Progress/Problems:Pt. participated in group on healthy support systems by identifying the the healthy supports within their individual lives. The group focused on how to reach out to supports without feeling like a burden. Pt expressed feeling like she does not have a support system, yet plans to get out of the house more to attend support groups in the hops of meeting new friends.  Autumn Fitzgerald [name] 09/29/2012, 10:44 AM

## 2012-09-29 NOTE — H&P (Signed)
Reviewed document and agree with the treatment recommendations 

## 2012-09-29 NOTE — Progress Notes (Signed)
Psychoeducational Group Note  Date:  09/29/2012 Time: 1015 Group Topic/Focus:  Making Healthy Choices:   The focus of this group is to help patients identify negative/unhealthy choices they were using prior to admission and identify positive/healthier coping strategies to replace them upon discharge.  Participation Level:  Active  Participation Quality:  Appropriate  Affect:  Appropriate  Cognitive:  Appropriate  Insight:  Good  Engagement in Group:  Good  Additional Comments:    Rich Brave 2:03 PM. 09/29/2012

## 2012-09-29 NOTE — Progress Notes (Signed)
Patient ID: Autumn Fitzgerald, female   DOB: 12-06-1946, 66 y.o.   MRN: [id number]  Problem: Anxiety, Depression  D: Patient interacting well with peers in milieu; no inappropriate behaviors noted.  A: Monitor Q 15 minutes for safety, encourage continued staff/peer interaction, administer medications as ordered by MD.  R: Patient compliant with medications, states she has less anxiety at this time. No distress noted; pt denies SI or plans to harm herself.

## 2012-09-29 NOTE — Progress Notes (Signed)
[facility] Group Notes:  (Counselor/Nursing/MHT/Case Management/Adjunct)  09/29/2012 11:43 PM  Type of Therapy:  Psychoeducational Skills  Participation Level:  Minimal  Participation Quality:  Appropriate  Affect:  Appropriate  Cognitive:  Appropriate  Insight:  Good  Engagement in Group:  Good  Engagement in Therapy:  Good  Modes of Intervention:  Education  Summary of Progress/Problems:  The patient stated in group that she had a good day. She states that she was more sociable with her peers. Her goal for tomorrow is to get discharged.    Hazle Coca S 09/29/2012, 11:43 PM

## 2012-09-29 NOTE — Discharge Planning (Signed)
Pt was seen this morning during discharge planning group.  Pt stated that she slept well and appetite is good.  Pt rated depression level at a zero and anxiety level at an zero.  Pt denies SI/HI/AVH.   

## 2012-09-29 NOTE — Progress Notes (Addendum)
Psychoeducational Group Note  Date:  09/29/2012 Time:  1515  Group Topic/Focus:  Conflict Resolution:   The focus of this group is to discuss the conflict resolution process and how it may be used upon discharge.  Participation Level:  Minimal  Participation Quality:  Appropriate, Attentive and Resistant  Affect:  Appropriate and Flat  Cognitive:  Alert and Appropriate  Insight:  Good  Engagement in Group:  Limited  Additional Comments:  Pt attended group but participation was limited.   Dalia Heading 09/29/2012, 7:52 PM

## 2012-09-29 NOTE — Progress Notes (Addendum)
D Cressida cont to try to understand her anxiety and her related poor coping mechanisms. SHe is less anxious and depressed today, as evidenced by her speech being less labored, she uses  more words and speaks a little slower, she  seen off and on throughout the day sitting with another female patient, with whom she is processing and trying to better understand the concepts she has learned in her groups, as they relate to her mental illness.  A She  Completed  Her self inventory and  On it she wrote  She denied having SI within the past 24 hrs and she rated her feelings of depression a "5".  R Safety is in place and POC includes fostering therapeutic relationship already established .

## 2012-09-30 MED ORDER — FLUOXETINE HCL 40 MG PO CAPS: 40.0000 mg | ORAL_CAPSULE | Freq: Every day | ORAL | Status: DC

## 2012-09-30 MED ORDER — TRAZODONE HCL 50 MG PO TABS: 50.0000 mg | ORAL_TABLET | Freq: Every day | ORAL | Status: DC

## 2012-09-30 MED ORDER — CITALOPRAM HYDROBROMIDE 20 MG PO TABS: 20.0000 mg | ORAL_TABLET | Freq: Every day | ORAL | Status: DC

## 2012-09-30 MED ORDER — QUETIAPINE FUMARATE 50 MG PO TABS: 50.0000 mg | ORAL_TABLET | Freq: Every evening | ORAL | Status: DC | PRN

## 2012-09-30 MED ORDER — BUSPIRONE HCL 5 MG PO TABS: 5.0000 mg | ORAL_TABLET | Freq: Three times a day (TID) | ORAL | Status: DC

## 2012-09-30 NOTE — Discharge Summary (Signed)
Physician Discharge Summary Note  Patient:  Autumn Fitzgerald is an 66 y.o., female MRN:  [id number] DOB:  25-Feb-1946 Patient phone:  [phone] (home)  Patient address:   [address] [place] [address]   Date of Admission:  09/27/2012 Date of Discharge: 09/30/2012  Discharge Diagnoses: Principal Problem:  *Panic disorder  Axis Diagnosis: AXIS I: Anxiety Disorder NOS, Panic Disorder AXIS II: No diagnosis  AXIS III:  Past Medical History   Diagnosis  Date   .  Panic attacks    .  Hypertrophic cardiomyopathy    .  GERD (gastroesophageal reflux disease)    .  HLD (hyperlipidemia)    .  Asthma    .  Palpitations     AXIS IV: other psychosocial or environmental problems  AXIS V: 61-70 mild symptoms       Level of Care:  Inpatient Hospitalization.  Reason for admission: Patient is a 66 year old woman with Increased anxiety and depression the past 2 weeks. Seen in [facility] 10/14 and discharged to her outpatient Psychiatrist Dr.Plovsky. Could not get an appointment for a week so she went to see her [facility] Dr.Turnbull who abruptly discontinued her Klonopin and Celexa starting Prozac.  Dr. Donell Beers recommended an Assessment and so patient and her son presented here to [facility] Friday afternoon. She reported daily crying poor sleep appetite sadness had thoughts to jump [place] the bridge near [place]. She has had SI in past but no actual attempt or gesture denies AVH but thinks she hears the phone ring at night.  Will continue Prozac start Seroquel and BuSpar.   Hospital Course:   The patient attended treatment team meeting this am and met with treatment team members. The patient's symptoms, treatment plan and response to treatment was discussed. The patient endorsed that their symptoms have improved. The patient also stated that they felt stable for discharge.  They reported that from this hospital stay they had learned many coping skills.  In other to maintain their psychiatric  stability, they will continue psychiatric care on an outpatient basis. They will follow-up as outlined below.  In addition they were instructed  to take all your medications as prescribed by their mental healthcare provider and to report any adverse effects and or reactions from your medicines to their outpatient provider promptly.  The patient is also instructed and cautioned to not engage in alcohol and or illegal drug use while on prescription medicines.  In the event of worsening symptoms the patient is instructed to call the crisis hotline, 911 and or go to the nearest ED for appropriate evaluation and treatment of symptoms.   Also while a patient in this hospital, the patient received medication management for his psychiatric symptoms. They were ordered and received as outlined below:    Medication List     As of 09/30/2012  2:01 PM    STOP taking these medications         acetaminophen 325 MG tablet   Commonly known as: TYLENOL      B-complex with vitamin C tablet      clonazePAM 2 MG tablet   Commonly known as: KLONOPIN      OSTEO BI-FLEX ADV DOUBLE ST PO      pantoprazole 40 MG tablet   Commonly known as: PROTONIX      TAKE these medications      Indication    busPIRone 5 MG tablet   Commonly known as: BUSPAR   Take 1 tablet (5  mg total) by mouth 3 (three) times daily. For anxiety       Calcium-Magnesium-Zinc 1000-400-15 MG Tabs   Take 1 tablet by mouth 3 (three) times daily.       cholecalciferol 1000 UNITS tablet   Commonly known as: VITAMIN D   Take 5,000 Units by mouth daily.       citalopram 20 MG tablet   Commonly known as: CELEXA   Take 1 tablet (20 mg total) by mouth at bedtime. For depression       ezetimibe-simvastatin 10-40 MG per tablet   Commonly known as: VYTORIN   Take 0.5 tablets by mouth at bedtime.       fish oil-omega-3 fatty acids 1000 MG capsule   Take 1 g by mouth daily.       FLUoxetine 40 MG capsule   Commonly known as: PROZAC   Take  1 capsule (40 mg total) by mouth daily. For depression       levothyroxine 50 MCG tablet   Commonly known as: SYNTHROID, LEVOTHROID   Take 50 mcg by mouth daily.       mometasone 220 MCG/INH inhaler   Commonly known as: ASMANEX   Inhale 1 puff into the lungs daily.       QUEtiapine 50 MG tablet   Commonly known as: SEROQUEL   Take 1 tablet (50 mg total) by mouth at bedtime and may repeat dose one time if needed. For anxiety       SYSTANE 0.4-0.3 % Soln   Generic drug: Polyethyl Glycol-Propyl Glycol   Apply 1 drop to eye 2 (two) times daily.       traZODone 50 MG tablet   Commonly known as: DESYREL   Take 1 tablet (50 mg total) by mouth at bedtime. For sleep        They were also enrolled in group counseling sessions and activities in which they participated actively.       Follow-up Information    Follow up with Dr. Donell Beers - [facility]. On 10/01/2012. (You are scheduled with Dr. Donell Beers on Tuesday, October 01, 2012 )    Contact information:   [address] [place], [place]  [address]  [phone]      Follow up with Abel Presto - [facility]. On 10/03/2012. (You are scheduled with Abel Presto on Thursday, October 03, 2012)          Upon discharge, patient adamantly denies suicidal, homicidal ideations, auditory, visual hallucinations and or delusional thinking. They left [facility] with all personal belongings via personal transportation in no apparent distress.  Consults:  Please see electronic medical record for details.  Significant Diagnostic Studies:  Please see electronic medical record for details.  Discharge Vitals:   Blood pressure 118/79, pulse 104, temperature 97 F (36.1 C), temperature source Oral, resp. rate 16, height 5' (1.524 m), weight 54.885 kg (121 lb)..  Mental Status Exam: See Mental Status Examination and Suicide Risk Assessment completed by Attending Physician prior to discharge.  Discharge destination:  Home  Is patient on multiple  antipsychotic therapies at discharge:  No  Has Patient had three or more failed trials of antipsychotic monotherapy by history: N/A Recommended Plan for Multiple Antipsychotic Therapies: N/A Discharge Orders    Future Orders Please Complete By Expires   Diet - low sodium heart healthy      Increase activity slowly          Medication List     As of 09/30/2012  2:01 PM    STOP taking these medications         acetaminophen 325 MG tablet   Commonly known as: TYLENOL      B-complex with vitamin C tablet      clonazePAM 2 MG tablet   Commonly known as: KLONOPIN      OSTEO BI-FLEX ADV DOUBLE ST PO      pantoprazole 40 MG tablet   Commonly known as: PROTONIX      TAKE these medications      Indication    busPIRone 5 MG tablet   Commonly known as: BUSPAR   Take 1 tablet (5 mg total) by mouth 3 (three) times daily. For anxiety       Calcium-Magnesium-Zinc 1000-400-15 MG Tabs   Take 1 tablet by mouth 3 (three) times daily.       cholecalciferol 1000 UNITS tablet   Commonly known as: VITAMIN D   Take 5,000 Units by mouth daily.       citalopram 20 MG tablet   Commonly known as: CELEXA   Take 1 tablet (20 mg total) by mouth at bedtime. For depression       ezetimibe-simvastatin 10-40 MG per tablet   Commonly known as: VYTORIN   Take 0.5 tablets by mouth at bedtime.       fish oil-omega-3 fatty acids 1000 MG capsule   Take 1 g by mouth daily.       FLUoxetine 40 MG capsule   Commonly known as: PROZAC   Take 1 capsule (40 mg total) by mouth daily. For depression       levothyroxine 50 MCG tablet   Commonly known as: SYNTHROID, LEVOTHROID   Take 50 mcg by mouth daily.       mometasone 220 MCG/INH inhaler   Commonly known as: ASMANEX   Inhale 1 puff into the lungs daily.       QUEtiapine 50 MG tablet   Commonly known as: SEROQUEL   Take 1 tablet (50 mg total) by mouth at bedtime and may repeat dose one time if needed. For anxiety       SYSTANE 0.4-0.3 % Soln    Generic drug: Polyethyl Glycol-Propyl Glycol   Apply 1 drop to eye 2 (two) times daily.       traZODone 50 MG tablet   Commonly known as: DESYREL   Take 1 tablet (50 mg total) by mouth at bedtime. For sleep            Follow-up Information    Follow up with Dr. Donell Beers - [facility]. On 10/01/2012. (You are scheduled with Dr. Donell Beers on Tuesday, October 01, 2012 )    Contact information:   [address] [place], [place]  [address]  [phone]      Follow up with Abel Presto - [facility]. On 10/03/2012. (You are scheduled with Abel Presto on Thursday, October 03, 2012)         Follow-up recommendations:   Activities: Resume typical activities Diet: Resume typical diet Other: Follow up with outpatient provider and report any side effects to out patient prescriber. Discontinue the Celexa.  Comments:  Take all your medications as prescribed by your mental healthcare provider. Report any adverse effects and or reactions from your medicines to your outpatient provider promptly. Patient is instructed and cautioned to not engage in alcohol and or illegal drug use while on prescription medicines. In the event of worsening symptoms, patient is instructed to call the crisis  hotline, 911 and or go to the nearest ED for appropriate evaluation and treatment of symptoms. Follow-up with your primary care provider for your other medical issues, concerns and or health care needs.  SignedDaleen Bo, Arles Rumbold 09/30/2012 2:01 PM

## 2012-09-30 NOTE — Progress Notes (Signed)
Patient ID: Autumn Fitzgerald, female   DOB: 1946/09/13, 66 y.o.   MRN: [id number] PER STATE REGULATIONS 482.30  THIS CHART WAS REVIEWED FOR MEDICAL NECESSITY WITH RESPECT TO THE PATIENT'S ADMISSION/ DURATION OF STAY.  NEXT REVIEW DATE: 10/03/2012  Willa Rough, RN, BSN CASE MANAGER

## 2012-09-30 NOTE — Tx Team (Signed)
Interdisciplinary Treatment Plan Update (Adult)  Date:  09/30/2012  Time Reviewed:  8:31 AM   Progress in Treatment: Attending groups:   Yes   Participating in groups:  Yes Taking medication as prescribed:  Yes Tolerating medication:  Yes Family/Significant othe contact made:  Contact made with son Patient understands diagnosis:  Yes Discussing patient identified problems/goals with staff: Yes Medical problems stabilized or resolved: Yes Denies suicidal/homicidal ideation:Yes Issues/concerns per patient self-inventory:  Other:  New problem(s) identified:  Reason for Continuation of Hospitalization:   Interventions implemented related to continuation of hospitalization:  Medication Management; safety checks q 15 mins  Additional comments:  Estimated length of stay:  Discharge Plan:  New goal(s):  Review of initial/current patient goals per problem list:    1.  Goal(s): Eliminate SI/other thoughts of self harm   Met:  Yes  Target date: d/c  As evidenced by: Patient no longer endorsing SI/HI or other thoughts of self harm.    2.  Goal (s): Reduce depression/anxiety   Met:  Yes  Target date: d/c  As evidenced by: Patient currently rating symptoms at four or below    3.  Goal(s): .stabilize on meds   Met:  Yes  Target date: d/c  As evidenced by: Patient reports being stabilized on medications - less symptomatic    4.  Goal(s): Refer for outpatient follow up   Met:  Yes  Target date: d/c  As evidenced by:Follow up appointment scheduled    Attendees: Patient:  Autumn Fitzgerald 09/30/2012 10:32 AM  Nursing: Neill Loft, RN 09/30/2012 10:32 AM  Physician:  Patrick North, MD 09/30/2012 8:31 AM   Nursing:   Dennison Nancy, RN 09/30/2012 8:31 AM   Clinical Social Worker:  Juline Patch, LCSW 09/30/2012 8:31 AM   Other:  Quintella Reichert, RN 09/30/2012 8:31 AM   Other:  Grandville Silos, LCSW, ADOSW    .

## 2012-09-30 NOTE — Progress Notes (Signed)
Pt denies SI/HI/AVH, d/c instructions/meds/followup reviewed, pt verbalizes understanding.  Pt belongings from locker #23 returned to pt.

## 2012-09-30 NOTE — Progress Notes (Signed)
[facility] Case Management Discharge Plan:  Will you be returning to the same living situation after discharge: Yes,  Patient will return to the home he shares with family At discharge, do you have transportation home?:Yes,  Family with transport her home Do you have the ability to pay for your medications:Yes,  Patient reports having Medicare/Medicaid  Interagency Information:     Release of information consent forms completed and in the chart;  Patient's signature needed at discharge.  Patient to Follow up at:  Follow-up Information    Follow up with Dr. Donell Beers - [facility]. On 10/01/2012. (You are scheduled with Dr. Donell Beers on Tuesday, October 01, 2012)    Contact information:   [address] [place], [place]  [address]  [phone]      Follow up with Abel Presto - [facility]. On 10/03/2012. (You are scheduled with Abel Presto on Thursday, October 03, 2012)          Patient denies SI/HI:   Yes,  Patient does not endorse SI/HI or other thoughts of self harm    Safety Planning and Suicide Prevention discussed:  Yes,  Reviewed with all patient during aftercare group  Barrier to discharge identified:  No barrier identified  Summary and Recommendations: Patient encouraged to be compliant with medications and follow up with outpatient recommendations    Greer Wainright, Joesph July 09/30/2012, 10:01 AM

## 2012-09-30 NOTE — Progress Notes (Signed)
[facility] In Patient Progress Note  09/29/2012 Autumn Fitzgerald 08/09/1946 [id number] This is a late entry for this note, but the patient was seen in a timely manner. Diagnosis:  Axis I: Anxiety Disorder NOS ADL's:  Intact  Sleep:  Yes,  AEB:  Appetite:  Suicidal Ideation: No suicidal ideation, no plan, no intent, no means.  Homicidal Ideation:  No homicidal ideation, no plan, no intent, no means.  Subjective: Patient is doing well and has no new complaints.  She states she feels better now that she is able to talk to someone and not sit at home alone all day.   height is 5' (1.524 m) and weight is 54.885 kg (121 lb). Her oral temperature is 97 F (36.1 C). Her blood pressure is 118/79 and her pulse is 104. Her respiration is 16.   Objective:  Patient states she needs a husband now!   Mental Status: Level of Consciousness:  Alert Orientation: x 3 General Appearance : casual Behavior:  cooperative Eye Contact:  Good Motor Behavior:  Normal Speech:  Normal Mood:  Anxious Affect:  Appropriate Anxiety Level:  Moderate Thought Process:  Coherent Thought Content:  WNL Perception:  Normal Judgment:  Fair Insight:  Present Cognition:  At least average Sleep:  Number of Hours: 6.25   Lab Results:  Results for orders placed during the hospital encounter of 09/27/12 (from the past 48 hour(s))  PROTIME-INR     Status: Normal   Collection Time   09/29/12  6:42 AM      Component Value Range Comment   Prothrombin Time 12.0  11.6 - 15.2 seconds    INR 0.89  0.00 - 1.49    Labs are reviewed. Physical Findings: AIMS: Facial and Oral Movements Muscles of Facial Expression: None, normal Lips and Perioral Area: None, normal Jaw: None, normal Tongue: None, normal,Extremity Movements Upper (arms, wrists, hands, fingers): None, normal Lower (legs, knees, ankles, toes): None, normal, Trunk Movements Neck, shoulders, hips: None, normal, Overall Severity Severity of abnormal movements  (highest score from questions above): None, normal Incapacitation due to abnormal movements: None, normal Patient's awareness of abnormal movements (rate only patient's report): No Awareness, Dental Status Current problems with teeth and/or dentures?: No Does patient usually wear dentures?: No  CIWA:    COWS:    Medication:  . busPIRone  5 mg Oral TID  . citalopram  20 mg Oral QHS  . ezetimibe  5 mg Oral QHS   And  . simvastatin  20 mg Oral QHS  . FLUoxetine  40 mg Oral Daily  . levothyroxine  50 mcg Oral Daily  . mometasone  2 puff Inhalation Daily  . pantoprazole  40 mg Oral Daily  . QUEtiapine  50 mg Oral QHS,MR X 1  . traZODone  50 mg Oral QHS    Treatment Plan Summary: Daily contact with patient to assess and evaluate symptoms and progress in treatment Medication management  Plan: 1. Continue current plan of care with no changes at this time. Rona Ravens. Payden Bonus PAC 09/30/2012, 4:50 PM

## 2012-09-30 NOTE — [facility] Suicide Risk Assessment (Signed)
Suicide Risk Assessment  Discharge Assessment     Demographic Factors:  Female   Mental Status Per Nursing Assessment::   On Admission:  NA  Current Mental Status by Physician: Patient alert and oriented to 4. Speech normal in rate and volume. Denies AH/VH/SI/HI.  Loss Factors:  Financial problems/change in socioeconomic status  Historical Factors: Impulsivity  Risk Reduction Factors:   Positive social support  Continued Clinical Symptoms:  Severe Anxiety and/or Agitation  Cognitive Features That Contribute To Risk:  Thought constriction (tunnel vision)    Suicide Risk:  Minimal: No identifiable suicidal ideation.  Patients presenting with no risk factors but with morbid ruminations; may be classified as minimal risk based on the severity of the depressive symptoms  Discharge Diagnoses:   AXIS I:  Anxiety Disorder NOS AXIS II:  No diagnosis AXIS III:   Past Medical History  Diagnosis Date  . Panic attacks   . Hypertrophic cardiomyopathy   . GERD (gastroesophageal reflux disease)   . HLD (hyperlipidemia)   . Asthma   . Palpitations    AXIS IV:  other psychosocial or environmental problems AXIS V:  61-70 mild symptoms  Plan Of Care/Follow-up recommendations:  Activity:  normal Diet:  normal Follow up with outpatient appointments.  Is patient on multiple antipsychotic therapies at discharge:  No   Has Patient had three or more failed trials of antipsychotic monotherapy by history:  No  Recommended Plan for Multiple Antipsychotic Therapies: NA  Autumn Fitzgerald 09/30/2012, 10:37 AM

## 2012-10-01 NOTE — B[facility] Counselor (Signed)
Adult Comprehensive Assessment  Patient ID: Autumn Fitzgerald, female   DOB: 1946/10/31, 66 y.o.   MRN: [id number]  Information Source:    Current Stressors:  Educational / Learning stressors: None identified Employment / Job issues: Patient is  disabled Family Relationships: patient does not identif any family IT consultant / Lack of resources (include bankruptcy): Pateint reports financial problems Housing / Lack of housing: Patient has housing Physical health (include injuries & life threatening diseases): Patient has hisgory of heart problems Social relationships: Patient states she does not have any social relationships Substance abuse: Patient denies Bereavement / Loss: patient denies  Living/Environment/Situation:  Living Arrangements: Alone Living conditions (as described by patient or guardian): She describes living conditions as comfortable How long has patient lived in current situation?: Five years What is atmosphere in current home: Comfortable  Family History:  Marital status: Divorced Divorced, when?: Five years What types of issues is patient dealing with in the relationship?: None identified Does patient have children?: Yes How many children?: 4  How is patient's relationship with their children?: She reports having a good relationship with children  Childhood History:  By whom was/is the patient raised?: Both parents Description of patient's relationship with caregiver when they were a child: She reports having caring parents Patient's description of current relationship with people who raised him/her: Parents are deceased Number of Siblings: 7  Description of patient's current relationship with siblings: good relationships - Sister will be contributing $60 to patient monthly Did patient suffer any verbal/emotional/physical/sexual abuse as a child?: No Did patient suffer from severe childhood neglect?: No Has patient ever been sexually abused/assaulted/raped  as an adolescent or adult?: No Was the patient ever a victim of a crime or a disaster?: No Witnessed domestic violence?: No Has patient been effected by domestic violence as an adult?: No  Education:  Highest grade of school patient has completed: 10th grade Currently a student?: No  Employment/Work Situation:   Employment situation: On disability Why is patient on disability: Age and heart problems How long has patient been on disability: Patient receiving social security What is the longest time patient has a held a job?: ten years Where was the patient employed at that time?: no Has patient ever been in the [employer]?: No Has patient ever served in Buyer, retail?: No  Financial Resources:   Surveyor, quantity resources: [employer];Medicare;Receives SSDI Does patient have a representative payee or guardian?: No  Alcohol/Substance Abuse:   What has been your use of drugs/alcohol within the last 12 months?: None If attempted suicide, did drugs/alcohol play a role in this?: No Alcohol/Substance Abuse Treatment Hx: Denies past history Has alcohol/substance abuse ever caused legal problems?: No  Social Support System:   Forensic psychologist System: None Type of faith/religion: no religious/faith involvement How does patient's faith help to cope with current illness?: nop  Leisure/Recreation:   Leisure and Hobbies: none  Strengths/Needs:   In what areas does patient struggle / problems for patient: None identified  Discharge Plan:   Does patient have access to transportation?: Yes Will patient be returning to same living situation after discharge?: Yes Currently receiving community mental health services: Yes (From Whom) (Dr. Donell Beers in [facility]) Does patient have financial barriers related to discharge medications?: No  Summary/Recommendations:   Summary and Recommendations (to be completed by the evaluator): Referral made for outpatient   [place], Joesph July.  10/01/2012

## 2012-10-01 NOTE — Progress Notes (Signed)
Patient Discharge Instructions:  After Visit Summary (AVS):   Faxed to:  10/01/2012 Psychiatric Admission Assessment Note:   Faxed to:  10/01/2012 Suicide Risk Assessment - Discharge Assessment:   Faxed to:  10/01/2012 Faxed/Sent to the Next Level Care provider:  10/01/2012  Faxed to [facility] @ [phone]  Wandra Scot, 10/01/2012, 5:28 PM

## 2012-10-01 NOTE — [facility] Counselor (Signed)
Adult Comprehensive Assessment  Patient ID: Page L Timpone, female   DOB: 01/15/1946, 66 y.o.   MRN: [id number]  Information Source:    Current Stressors:  Educational / Learning stressors: None identified Employment / Job issues: Patient is  disabled Family Relationships: patient does not identif any family problems Financial / Lack of resources (include bankruptcy): Pateint reports financial problems Housing / Lack of housing: Patient has housing Physical health (include injuries & life threatening diseases): Patient has hisgory of heart problems Social relationships: Patient states she does not have any social relationships Substance abuse: Patient denies Bereavement / Loss: patient denies  Living/Environment/Situation:  Living Arrangements: Alone Living conditions (as described by patient or guardian): She describes living conditions as comfortable How long has patient lived in current situation?: Five years What is atmosphere in current home: Comfortable  Family History:  Marital status: Divorced Divorced, when?: Five years What types of issues is patient dealing with in the relationship?: None identified Does patient have children?: Yes How many children?: 4  How is patient's relationship with their children?: She reports having a good relationship with children  Childhood History:  By whom was/is the patient raised?: Both parents Description of patient's relationship with caregiver when they were a child: She reports having caring parents Patient's description of current relationship with people who raised him/her: Parents are deceased Number of Siblings: 7  Description of patient's current relationship with siblings: good relationships - Sister will be contributing $60 to patient monthly Did patient suffer any verbal/emotional/physical/sexual abuse as a child?: No Did patient suffer from severe childhood neglect?: No Has patient ever been sexually abused/assaulted/raped  as an adolescent or adult?: No Was the patient ever a victim of a crime or a disaster?: No Witnessed domestic violence?: No Has patient been effected by domestic violence as an adult?: No  Education:  Highest grade of school patient has completed: 10th grade Currently a student?: No  Employment/Work Situation:   Employment situation: On disability Why is patient on disability: Age and heart problems How long has patient been on disability: Patient receiving social security What is the longest time patient has a held a job?: ten years Where was the patient employed at that time?: no Has patient ever been in the military?: No Has patient ever served in combat?: No  Financial Resources:   Financial resources: Medicaid;Medicare;Receives SSDI Does patient have a representative payee or guardian?: No  Alcohol/Substance Abuse:   What has been your use of drugs/alcohol within the last 12 months?: None If attempted suicide, did drugs/alcohol play a role in this?: No Alcohol/Substance Abuse Treatment Hx: Denies past history Has alcohol/substance abuse ever caused legal problems?: No  Social Support System:   Patient's Community Support System: None Type of faith/religion: no religious/faith involvement How does patient's faith help to cope with current illness?: nop  Leisure/Recreation:   Leisure and Hobbies: none  Strengths/Needs:   In what areas does patient struggle / problems for patient: None identified  Discharge Plan:   Does patient have access to transportation?: Yes Will patient be returning to same living situation after discharge?: Yes Currently receiving community mental health services: Yes (From Whom) (Dr. Plovsky in [place]) Does patient have financial barriers related to discharge medications?: No  Summary/Recommendations:   Summary and Recommendations (to be completed by the evaluator): Referral made for outpatient   Sharen Youngren Hairston.  10/01/2012 

## 2012-11-19 DIAGNOSIS — J45909 Unspecified asthma, uncomplicated: Secondary | ICD-10-CM | POA: Diagnosis not present

## 2012-11-19 DIAGNOSIS — J309 Allergic rhinitis, unspecified: Secondary | ICD-10-CM | POA: Diagnosis not present

## 2012-12-11 DIAGNOSIS — Z87898 Personal history of other specified conditions: Secondary | ICD-10-CM

## 2012-12-11 HISTORY — DX: Personal history of other specified conditions: Z87.898

## 2013-02-08 DIAGNOSIS — S0990XA Unspecified injury of head, initial encounter: Secondary | ICD-10-CM

## 2013-02-08 HISTORY — DX: Unspecified injury of head, initial encounter: S09.90XA

## 2013-03-11 ENCOUNTER — Other Ambulatory Visit (HOSPITAL_COMMUNITY): Payer: Self-pay | Admitting: Cardiovascular Disease

## 2013-03-11 DIAGNOSIS — R55 Syncope and collapse: Secondary | ICD-10-CM

## 2013-03-11 DIAGNOSIS — I422 Other hypertrophic cardiomyopathy: Secondary | ICD-10-CM

## 2013-03-11 DIAGNOSIS — R002 Palpitations: Secondary | ICD-10-CM

## 2013-03-11 DIAGNOSIS — D689 Coagulation defect, unspecified: Secondary | ICD-10-CM | POA: Diagnosis not present

## 2013-03-13 ENCOUNTER — Encounter (HOSPITAL_COMMUNITY): Payer: Self-pay | Admitting: Pharmacy Technician

## 2013-03-13 ENCOUNTER — Other Ambulatory Visit: Payer: Self-pay | Admitting: *Deleted

## 2013-03-13 DIAGNOSIS — R002 Palpitations: Secondary | ICD-10-CM

## 2013-03-14 ENCOUNTER — Ambulatory Visit (HOSPITAL_COMMUNITY)
Admission: RE | Admit: 2013-03-14 | Discharge: 2013-03-14 | Disposition: A | Discharge status: Home / Self Care | Payer: Medicare Other | Source: Ambulatory Visit | Attending: Cardiovascular Disease | Admitting: Cardiovascular Disease

## 2013-03-14 DIAGNOSIS — R55 Syncope and collapse: Secondary | ICD-10-CM | POA: Diagnosis not present

## 2013-03-14 DIAGNOSIS — R002 Palpitations: Secondary | ICD-10-CM | POA: Insufficient documentation

## 2013-03-14 DIAGNOSIS — I422 Other hypertrophic cardiomyopathy: Secondary | ICD-10-CM | POA: Insufficient documentation

## 2013-03-14 HISTORY — PX: DOPPLER ECHOCARDIOGRAPHY: SHX263

## 2013-03-14 NOTE — Progress Notes (Signed)
[facility]   2D echo completed 03/14/2013.   Cindy Marianna Cid, RDCS  

## 2013-03-17 MED ORDER — SODIUM CHLORIDE 0.9 % IR SOLN
80.0000 mg | Status: DC
  Filled 2013-03-17: qty 2

## 2013-03-17 MED ORDER — CEFAZOLIN SODIUM-DEXTROSE 2-3 GM-% IV SOLR
2.0000 g | INTRAVENOUS | Status: DC
  Filled 2013-03-17: qty 50

## 2013-03-18 ENCOUNTER — Encounter (HOSPITAL_COMMUNITY)
Admission: RE | Disposition: A | Discharge status: Home / Self Care | Payer: Self-pay | Source: Ambulatory Visit | Attending: Cardiovascular Disease

## 2013-03-18 ENCOUNTER — Ambulatory Visit (HOSPITAL_COMMUNITY)
Admission: RE | Admit: 2013-03-18 | Discharge: 2013-03-18 | Disposition: A | Discharge status: Home / Self Care | Payer: Medicare Other | Source: Ambulatory Visit | Attending: Cardiovascular Disease | Admitting: Cardiovascular Disease

## 2013-03-18 DIAGNOSIS — E039 Hypothyroidism, unspecified: Secondary | ICD-10-CM | POA: Insufficient documentation

## 2013-03-18 DIAGNOSIS — IMO0001 Reserved for inherently not codable concepts without codable children: Secondary | ICD-10-CM | POA: Insufficient documentation

## 2013-03-18 DIAGNOSIS — I447 Left bundle-branch block, unspecified: Secondary | ICD-10-CM | POA: Insufficient documentation

## 2013-03-18 DIAGNOSIS — R55 Syncope and collapse: Secondary | ICD-10-CM | POA: Insufficient documentation

## 2013-03-18 DIAGNOSIS — I422 Other hypertrophic cardiomyopathy: Secondary | ICD-10-CM | POA: Insufficient documentation

## 2013-03-18 DIAGNOSIS — Z79899 Other long term (current) drug therapy: Secondary | ICD-10-CM | POA: Insufficient documentation

## 2013-03-18 DIAGNOSIS — R002 Palpitations: Secondary | ICD-10-CM | POA: Insufficient documentation

## 2013-03-18 HISTORY — PX: LOOP RECORDER IMPLANT: SHX5477

## 2013-03-18 HISTORY — PX: LOOP RECORDER IMPLANT: SHX5954

## 2013-03-18 LAB — SURGICAL PCR SCREEN
MRSA, PCR: NEGATIVE
Staphylococcus aureus: NEGATIVE

## 2013-03-18 SURGERY — LOOP RECORDER IMPLANT
Anesthesia: LOCAL

## 2013-03-18 MED ORDER — SODIUM CHLORIDE 0.9 % IV SOLN: INTRAVENOUS | Status: DC

## 2013-03-18 MED ORDER — MUPIROCIN 2 % EX OINT
TOPICAL_OINTMENT | Freq: Once | CUTANEOUS | Status: AC
  Administered 2013-03-18: 1 via NASAL

## 2013-03-18 MED ORDER — LIDOCAINE HCL (PF) 1 % IJ SOLN
INTRAMUSCULAR | Status: AC
  Filled 2013-03-18: qty 30

## 2013-03-18 NOTE — H&P (Signed)
Date of Initial H&P: 03/11/2013  History reviewed, patient examined, no change in status, stable for surgery. Here for loop recorder implantation for palpitations and near-syncope in the setting of hypertrophic cardiomyopathy. This procedure has been fully reviewed with the patient and written informed consent has been obtained. Thurmon Fair, MD, [facility] [phone] office [phone] pager

## 2013-03-18 NOTE — Discharge Instructions (Signed)
Supplemental Discharge Instructions   WOUND CARE   Keep the wound area clean and dry.  Remove the dressing the day after you return home (usually 48 hours after the procedure).   DO NOT SUBMERGE UNDER WATER UNTIL FULLY HEALED (no tub baths, hot tubs, swimming pools, etc.).    You  may shower or take a sponge bath after the dressing is removed. DO NOT SOAK the area and do not allow the shower to directly spray on the site.   If you have tape/steri-strips on your wound, these will fall off; do not pull them off prematurely.     No bandage is needed on the site.  DO  NOT apply any creams, oils, or ointments to the wound area.   If you notice any drainage or discharge from the wound, any swelling, excessive redness or bruising at the site, or if you develop a fever > 101? F after you are discharged home, call the office at once.  Special Instructions   You are still able to use cellular telephones.  Avoid carrying your cellular phone near your device.   When traveling through airports, show security personnel your identification card to avoid being screened in the metal detectors.    Microwave ovens are safe to be near or to operate.

## 2013-03-18 NOTE — Op Note (Signed)
LOOP RECORDER IMPLANT [[id number]] LOOP RECORDER IMPLANT [[id number]]   Procedure report  Procedure performed:   Loop recorder implantation  Reason for procedure:  1. Near syncope 2. Hypertrophic cardiomyopathy Procedure performed by:  Thurmon Fair, MD  Complications:  None  Estimated blood loss:  <5 mL  Medications administered during procedure:  None Device details:  Medtronic Reveal LINQ  model number [id number], serial number [id number]  Procedure details:  After the risks and benefits of the procedure were discussed the patient provided informed consent. She was brought to the cardiac catheter lab in the fasting state. The patient was prepped and draped in usual sterile fashion. Local anesthesia with 1% lidocaine was administered to to the left parasternal area, in the 4th intercostal space. Using the introducer kit, the device was inserted in the subcutaneous tissue. Local pressure was applied to stop minor bleeding. Steristrips and a sterile dressing were applied.  Thurmon Fair, MD, [facility] [phone] office [phone] pager 03/18/2013 12:24 PM

## 2013-04-20 ENCOUNTER — Encounter: Payer: Self-pay | Admitting: *Deleted

## 2013-05-09 ENCOUNTER — Other Ambulatory Visit: Payer: Self-pay | Admitting: Family Medicine

## 2013-05-09 DIAGNOSIS — Z0000 Encounter for general adult medical examination without abnormal findings: Secondary | ICD-10-CM | POA: Diagnosis not present

## 2013-05-09 DIAGNOSIS — Z1231 Encounter for screening mammogram for malignant neoplasm of breast: Secondary | ICD-10-CM

## 2013-05-09 DIAGNOSIS — E2839 Other primary ovarian failure: Secondary | ICD-10-CM

## 2013-05-09 DIAGNOSIS — E785 Hyperlipidemia, unspecified: Secondary | ICD-10-CM | POA: Diagnosis not present

## 2013-05-23 ENCOUNTER — Encounter: Payer: Self-pay | Admitting: Cardiovascular Disease

## 2013-06-06 ENCOUNTER — Other Ambulatory Visit: Payer: Self-pay

## 2013-06-06 ENCOUNTER — Ambulatory Visit: Payer: Self-pay

## 2013-06-20 ENCOUNTER — Ambulatory Visit
Admission: RE | Admit: 2013-06-20 | Discharge: 2013-06-20 | Disposition: A | Discharge status: Home / Self Care | Payer: Medicare Other | Source: Ambulatory Visit | Attending: Family Medicine | Admitting: Family Medicine

## 2013-06-20 DIAGNOSIS — E2839 Other primary ovarian failure: Secondary | ICD-10-CM

## 2013-06-20 DIAGNOSIS — Z1231 Encounter for screening mammogram for malignant neoplasm of breast: Secondary | ICD-10-CM

## 2013-07-01 DIAGNOSIS — K625 Hemorrhage of anus and rectum: Secondary | ICD-10-CM | POA: Diagnosis not present

## 2013-07-04 ENCOUNTER — Emergency Department (HOSPITAL_COMMUNITY): Payer: Medicare Other

## 2013-07-04 ENCOUNTER — Emergency Department (HOSPITAL_COMMUNITY)
Admission: EM | Admit: 2013-07-04 | Discharge: 2013-07-04 | Disposition: A | Discharge status: Home / Self Care | Payer: Medicare Other | Attending: Emergency Medicine | Admitting: Emergency Medicine

## 2013-07-04 ENCOUNTER — Encounter (HOSPITAL_COMMUNITY): Payer: Self-pay | Admitting: *Deleted

## 2013-07-04 DIAGNOSIS — K219 Gastro-esophageal reflux disease without esophagitis: Secondary | ICD-10-CM | POA: Insufficient documentation

## 2013-07-04 DIAGNOSIS — R5381 Other malaise: Secondary | ICD-10-CM | POA: Insufficient documentation

## 2013-07-04 DIAGNOSIS — J45909 Unspecified asthma, uncomplicated: Secondary | ICD-10-CM | POA: Insufficient documentation

## 2013-07-04 DIAGNOSIS — E785 Hyperlipidemia, unspecified: Secondary | ICD-10-CM | POA: Insufficient documentation

## 2013-07-04 DIAGNOSIS — M542 Cervicalgia: Secondary | ICD-10-CM | POA: Insufficient documentation

## 2013-07-04 DIAGNOSIS — I10 Essential (primary) hypertension: Secondary | ICD-10-CM

## 2013-07-04 DIAGNOSIS — F410 Panic disorder [episodic paroxysmal anxiety] without agoraphobia: Secondary | ICD-10-CM | POA: Insufficient documentation

## 2013-07-04 DIAGNOSIS — Z8679 Personal history of other diseases of the circulatory system: Secondary | ICD-10-CM | POA: Insufficient documentation

## 2013-07-04 DIAGNOSIS — Z7982 Long term (current) use of aspirin: Secondary | ICD-10-CM | POA: Insufficient documentation

## 2013-07-04 DIAGNOSIS — R079 Chest pain, unspecified: Secondary | ICD-10-CM

## 2013-07-04 DIAGNOSIS — R002 Palpitations: Secondary | ICD-10-CM | POA: Insufficient documentation

## 2013-07-04 DIAGNOSIS — Z951 Presence of aortocoronary bypass graft: Secondary | ICD-10-CM | POA: Insufficient documentation

## 2013-07-04 DIAGNOSIS — M79609 Pain in unspecified limb: Secondary | ICD-10-CM | POA: Insufficient documentation

## 2013-07-04 DIAGNOSIS — IMO0001 Reserved for inherently not codable concepts without codable children: Secondary | ICD-10-CM | POA: Insufficient documentation

## 2013-07-04 DIAGNOSIS — R5383 Other fatigue: Secondary | ICD-10-CM | POA: Insufficient documentation

## 2013-07-04 DIAGNOSIS — Z79899 Other long term (current) drug therapy: Secondary | ICD-10-CM | POA: Insufficient documentation

## 2013-07-04 DIAGNOSIS — I422 Other hypertrophic cardiomyopathy: Secondary | ICD-10-CM

## 2013-07-04 DIAGNOSIS — R42 Dizziness and giddiness: Secondary | ICD-10-CM | POA: Insufficient documentation

## 2013-07-04 LAB — POCT I-STAT TROPONIN I: Troponin i, poc: 0.01 ng/mL (ref 0.00–0.08)

## 2013-07-04 LAB — BASIC METABOLIC PANEL
BUN: 15 mg/dL (ref 6–23)
CO2: 28 mEq/L (ref 19–32)
Calcium: 9.2 mg/dL (ref 8.4–10.5)
Chloride: 100 mEq/L (ref 96–112)
Creatinine, Ser: 0.76 mg/dL (ref 0.50–1.10)
GFR calc Af Amer: >90 mL/min (ref >90)
GFR calc non Af Amer: 85 mL/min — ABNORMAL LOW (ref >90)
Glucose, Bld: 115 mg/dL — ABNORMAL HIGH (ref 70–99)
Potassium: 4 mEq/L (ref 3.5–5.1)
Sodium: 139 mEq/L (ref 135–145)

## 2013-07-04 LAB — CBC
HCT: 39.2 % (ref 36.0–46.0)
Hemoglobin: 13.5 g/dL (ref 12.0–15.0)
MCH: 29.5 pg (ref 26.0–34.0)
MCHC: 34.4 g/dL (ref 30.0–36.0)
MCV: 85.6 fL (ref 78.0–100.0)
Platelets: 236 10*3/uL (ref 150–400)
RBC: 4.58 MIL/uL (ref 3.87–5.11)
RDW: 13.2 % (ref 11.5–15.5)
WBC: 4.6 10*3/uL (ref 4.0–10.5)

## 2013-07-04 LAB — TROPONIN I: Troponin I: <0.3 ng/mL (ref <0.30)

## 2013-07-04 NOTE — ED Notes (Signed)
Paged Dr. Rennis Golden to [phone]

## 2013-07-04 NOTE — ED Provider Notes (Signed)
CSN: [id number]     Arrival date & time 07/04/13  1132 History     First MD Initiated Contact with Patient 07/04/13 1155     Chief Complaint  Patient presents with  . Chest Pain   (Consider location/radiation/quality/duration/timing/severity/associated sxs/prior Treatment) HPI Comments: Ms. Brave is a 67 yo female with a PMH of hypertrophic obstructive cardiomyopathy, HLD, panic attacks and fibromyalgia presenting with a 1 week hx of intermittent, central, non-radiating CP.  She is unable to characterize the pain but reports it last a few minutes and is relieved with rest.  She denies SOB/N/V.  She had CABG (2008) and a loop recorder implanted (April 2014) for palpitations and near-syncope.  She also reports neck and R forearm pain.   Past Medical History  Diagnosis Date  . Panic attacks   . 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 GERD (gastroesophageal reflux disease)   . HLD (hyperlipidemia)   . Asthma   . Palpitations   . Near syncope   . Fibromyalgia   . Chest wall pain    Past Surgical History  Procedure Laterality Date  . 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 Tubal ligation    . Cholecystectomy    . Loop recorder implant  03/18/2013  . Septal myectomy    . Nuclear stress test  11/30/2011    No ischemia  . Doppler echocardiography  03/14/2013    EF 65-70%,trivial AI,mild-mod. MR,Mod. TR,mod. concentric hypertrophy   Family History  Problem Relation Age of Onset  . Cancer Mother     breast  . Hypertension Mother   . Cancer Father     prostate  . Diabetes Sister   . Heart attack Sister    History  Substance Use Topics  . Smoking status: Never Smoker   . Smokeless tobacco: Not on file  . Alcohol Use: No   OB History   Grav Para Term Preterm Abortions TAB SAB Ect Mult Living   5 5 5       4      Review of Systems  Respiratory: Negative for shortness of breath.   Cardiovascular: Positive for chest pain and palpitations. Negative for leg  swelling.  Gastrointestinal: Negative for diarrhea and constipation.  Genitourinary: Negative for difficulty urinating.    Allergies  Review of patient's allergies indicates no known allergies.  Home Medications   Current Outpatient Rx  Name  Route  Sig  Dispense  Refill  . acetaminophen (TYLENOL) 500 MG tablet   Oral   Take 500 mg by mouth every 6 (six) hours as needed for pain.         Marland Kitchen aspirin 81 MG tablet   Oral   Take 81 mg by mouth daily.         Marland Kitchen atenolol (TENORMIN) 25 MG tablet   Oral   Take 25 mg by mouth daily as needed.         Marland Kitchen b complex vitamins capsule   Oral   Take 1 capsule by mouth daily.         . cholecalciferol (VITAMIN D) 1000 UNITS tablet   Oral   Take 5,000 Units by mouth daily.           . clonazePAM (KLONOPIN) 1 MG tablet   Oral   Take 1-2 mg by mouth 3 (three) times daily as needed for anxiety (take 1 thru out the day if needed and take 2 tabelts at bedtime).         Marland Kitchen  ezetimibe-simvastatin (VYTORIN) 10-40 MG per tablet   Oral   Take 0.5 tablets by mouth at bedtime.          . fish oil-omega-3 fatty acids 1000 MG capsule   Oral   Take 1 g by mouth daily as needed (for vitamin).          Marland Kitchen FLUoxetine (PROZAC) 20 MG capsule   Oral   Take 20 mg by mouth daily.         Marland Kitchen lactobacillus acidophilus (BACID) TABS   Oral   Take 1 tablet by mouth daily.         . Misc Natural Products (OSTEO BI-FLEX ADV DOUBLE ST) TABS   Oral   Take 1 tablet by mouth 2 (two) times daily.         . mometasone (ASMANEX) 220 MCG/INH inhaler   Inhalation   Inhale 1 puff into the lungs daily as needed (for shortness of breath).          . Multiple Vitamin (MULTIVITAMIN) tablet   Oral   Take 1 tablet by mouth daily.         . pantoprazole (PROTONIX) 40 MG tablet   Oral   Take 40 mg by mouth daily.         Bertram Gala Glycol-Propyl Glycol (SYSTANE) 0.4-0.3 % SOLN   Ophthalmic   Apply 1 drop to eye 2 (two) times daily.            BP 133/79  Pulse 86  Temp(Src) 98.2 F (36.8 C) (Oral)  Resp 18  SpO2 97% Physical Exam  Constitutional: She is oriented to person, place, and time. She appears well-developed. No distress.  Eyes: Pupils are equal, round, and reactive to light.  Neck: Neck supple.  Cardiovascular: Normal rate, regular rhythm and normal heart sounds.   Pulmonary/Chest: Effort normal and breath sounds normal.  Abdominal: Soft. Bowel sounds are normal. She exhibits no distension. There is no tenderness. There is no guarding.  Musculoskeletal: Normal range of motion. She exhibits no edema and no tenderness.  Neurological: She is alert and oriented to person, place, and time.  Skin: She is not diaphoretic.  Psychiatric: She has a normal mood and affect. Her behavior is normal.    ED Course   Procedures (including critical care time)  Labs Reviewed  CBC  BASIC METABOLIC PANEL  POCT I-STAT TROPONIN I   No results found. No diagnosis found.  MDM  67 yo female with a PMH of HOCM, HLD, HTN presents with CP x 1 week.  EKG unchanged from prior and trop neg on exam.  Pt with multiple risk factors for ACS.  Will consult [facility] for admission.  Evelena Peat, DO 07/04/13 1710

## 2013-07-04 NOTE — Consult Note (Addendum)
Reason for Consult: Chest Pain Referring Physician: [facility]   HPI:  The patient is a 67 year old female, followed at [facility] by Dr. [name], with a history of hypertrophic obstructive cardiomyopathy, status post septal myectomy performed many years ago. She was recently seen by Dr. [name], in clinic, in April for complaints of palpitations and a couple of syncopal events. Subsequently, she underwent implantation of a loop recorder on 03/18/13. She had a repeat echocardiogram that showed no evidence of left ventricular outflow tract obstruction, normal left ventricular systolic function and no overt hemodynamic abnormalities except for a hyperdynamic left ventricle. She followed up with Dr. [name], post loop recorder insertion. At that time, she had continued to endorse palpations. Her loop recorder had not recorded any arrhythmia. There had been no automatic recording of any tachy or brady events and when the patient activated the recorder the tracings simply showed a sinus rhythm. A single PAC was seen on one of numerous tracings that she submitted. Dr. [name] recommended increasing her beta blocker. However, the patient verbalized desire not to do so, due to fear of the unwanted side effect of increased fatigue. She presented to the [facility] ER today with a complaint of atypical chest pain that has been occuring intermittently for the past several days. It is in the right side of her chest near her right breast. She has had similar pain in both shoulders and elbows. It is characterized a sharp pains lasting only a few seconds. It is worse with overhead movement of her arms and somewhat reproducible with palpation. It is also mildly pleuritic. No recent LEE pain or calf swelling. No recent prolonged travel or orthopedic surgery. She continues to endorse frequent palpations and continues to have fatigue when doing household chores. She denies any pain currently. No other symptoms at the present moment.    Past Medical History  Diagnosis Date  . Panic attacks   . 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 GERD (gastroesophageal reflux disease)   . HLD (hyperlipidemia)   . Asthma   . Palpitations   . Near syncope   . Fibromyalgia   . Chest wall pain     Past Surgical History  Procedure Laterality Date  . 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 Tubal ligation    . Cholecystectomy    . Loop recorder implant  03/18/2013  . Septal myectomy    . Nuclear stress test  11/30/2011    No ischemia  . Doppler echocardiography  03/14/2013    EF 65-70%,trivial AI,mild-mod. MR,Mod. TR,mod. concentric hypertrophy    Family History  Problem Relation Age of Onset  . Cancer Mother     breast  . Hypertension Mother   . Cancer Father     prostate  . Diabetes Sister   . Heart attack Sister     Social History:  reports that she has never smoked. She does not have any smokeless tobacco history on file. She reports that she does not drink alcohol or use illicit drugs.  Allergies: No Known Allergies  Medications:  Prior to Admission medications   Medication Sig Start Date End Date Taking? Authorizing Provider  acetaminophen (TYLENOL) 500 MG tablet Take 500 mg by mouth every 6 (six) hours as needed for pain.   Yes Historical Provider, MD  atenolol (TENORMIN) 25 MG tablet Take 25 mg by mouth daily as needed (blood pressure goes up).    Yes Historical Provider, MD  cholecalciferol (VITAMIN D) 1000 UNITS tablet Take 5,000  Units by mouth daily.     Yes Historical Provider, MD  clonazePAM (KLONOPIN) 1 MG tablet Take 1-2 mg by mouth 3 (three) times daily as needed for anxiety (take 1 thru out the day if needed and take 2 tabelts at bedtime).   Yes Historical Provider, MD  ezetimibe-simvastatin (VYTORIN) 10-40 MG per tablet Take 0.5 tablets by mouth at bedtime.    Yes Historical Provider, MD  FLUoxetine (PROZAC) 20 MG capsule Take 20 mg by mouth daily.   Yes Historical Provider, MD   lactobacillus acidophilus (BACID) TABS Take 1 tablet by mouth daily.   Yes Historical Provider, MD  Misc Natural Products (OSTEO BI-FLEX ADV DOUBLE ST) TABS Take 1 tablet by mouth 2 (two) times daily.   Yes Historical Provider, MD  mometasone (ASMANEX) 220 MCG/INH inhaler Inhale 1 puff into the lungs daily as needed (for shortness of breath).    Yes Historical Provider, MD  Multiple Vitamin (MULTIVITAMIN) tablet Take 1 tablet by mouth daily.   Yes Historical Provider, MD  pantoprazole (PROTONIX) 40 MG tablet Take 40 mg by mouth daily.   Yes Historical Provider, MD  Polyethyl Glycol-Propyl Glycol (SYSTANE) 0.4-0.3 % SOLN Apply 1 drop to eye 2 (two) times daily.    Yes Historical Provider, MD  vitamin B-12 (CYANOCOBALAMIN) 1000 MCG tablet Take 5,000 mcg by mouth daily.   Yes Historical Provider, MD    Results for orders placed during the hospital encounter of 07/04/13 (from the past 48 hour(s))  CBC     Status: None   Collection Time    07/04/13 12:01 PM      Result Value Range   WBC 4.6  4.0 - 10.5 K/uL   RBC 4.58  3.87 - 5.11 MIL/uL   Hemoglobin 13.5  12.0 - 15.0 g/dL   HCT 96.0  45.4 - 09.8 %   MCV 85.6  78.0 - 100.0 fL   MCH 29.5  26.0 - 34.0 pg   MCHC 34.4  30.0 - 36.0 g/dL   RDW 11.9  14.7 - 82.9 %   Platelets 236  150 - 400 K/uL  BASIC METABOLIC PANEL     Status: Abnormal   Collection Time    07/04/13 12:01 PM      Result Value Range   Sodium 139  135 - 145 mEq/L   Potassium 4.0  3.5 - 5.1 mEq/L   Chloride 100  96 - 112 mEq/L   CO2 28  19 - 32 mEq/L   Glucose, Bld 115 (*) 70 - 99 mg/dL   BUN 15  6 - 23 mg/dL   Creatinine, Ser 5.62  0.50 - 1.10 mg/dL   Calcium 9.2  8.4 - 13.0 mg/dL   GFR calc non Af Amer 85 (*) >90 mL/min   GFR calc Af Amer >90  >90 mL/min   Comment:            The eGFR has been calculated     using the CKD EPI equation.     This calculation has not been     validated in all clinical     situations.     eGFR's persistently     <90 mL/min signify      possible Chronic Kidney Disease.  POCT I-STAT TROPONIN I     Status: None   Collection Time    07/04/13 12:08 PM      Result Value Range   Troponin i, poc 0.01  0.00 - 0.08 ng/mL   Comment  3            Comment: Due to the release kinetics of cTnI,     a negative result within the first hours     of the onset of symptoms does not rule out     myocardial infarction with certainty.     If myocardial infarction is still suspected,     repeat the test at appropriate intervals.    Dg Chest 2 View  07/04/2013   *RADIOLOGY REPORT*  Clinical Data: Chest pain, right arm pain.  CHEST - 2 VIEW  Comparison: 09/23/2012  Findings: Heart and mediastinal contours are within normal limits. No focal opacities or effusions.  No acute bony abnormality.  Prior median sternotomy.  IMPRESSION: No active cardiopulmonary disease.   Original Report Authenticated By: Charlett Nose, M.D.    Review of Systems  Constitutional: Positive for malaise/fatigue.  HENT: Ear discharge:      Respiratory: Negative for shortness of breath.   Cardiovascular: Positive for chest pain and palpitations. Negative for leg swelling.  Neurological: Positive for dizziness.  All other systems reviewed and are negative.   Blood pressure 117/73, pulse 74, temperature 98.2 F (36.8 C), temperature source Oral, resp. rate 16, SpO2 97.00%. Physical Exam  Constitutional: She is oriented to person, place, and time. She appears well-developed and well-nourished. No distress.  Neck: No JVD present. Carotid bruit is not present.  Cardiovascular: Normal rate, regular rhythm, normal heart sounds and intact distal pulses.  Exam reveals no gallop and no friction rub.   No murmur heard. Pulses:      Radial pulses are 2+ on the right side, and 2+ on the left side.       Dorsalis pedis pulses are 2+ on the right side, and 2+ on the left side.  Respiratory: Effort normal and breath sounds normal. No respiratory distress. She has no wheezes. She has  no rales. She exhibits tenderness (right-sided chest wall).  GI: Soft. Bowel sounds are normal. She exhibits no distension and no mass. There is no tenderness.  Musculoskeletal: She exhibits no edema.  Neurological: She is alert and oriented to person, place, and time.  Skin: Skin is warm and dry. She is not diaphoretic.  Psychiatric: She has a normal mood and affect. Her behavior is normal.    Assessment/Plan: Principal Problem:   Chest pain with low risk for cardiac etiology Active Problems:   Palpitations  Plan: 67 y/o female with past history of hypertrophic cardiomyopathy, s/p septal myectomy in 2008, presents for evaluation of atypical chest pain and palpitations. She has had recent w/u for palpitations as an OP, by Dr. [name]. She has an implanted loop recorder with no documented arrhthymias. She has been on BB therapy for a while now. Her EKG shows NSR and is unchanged from prior studies. Her HR is in the 70s. BP is stable. POC troponin is negative. All other labs are benign. Her chest pain symptoms sound non cardiac and is more consistent with musculoskeletal chest pain. I am doubtful of PE with no recent history of significant dyspnea,  prolonged travel, LEE pain/swelling, and no recent orthopedic surgery. Will order however a POC d-Dimer for safe measure. If normal, can likely discharge from ED with trial of NSAIDs. MD to follow.   Allayne Butcher, PA-C 07/04/2013, 4:43 PM   I have seen and evaluated the patient this PM along with Boyce Medici, PA. I agree with her findings, examination as well as impression recommendations.  67  y/o woman with prior myectomy for HOCM - no known CAD.  She came today with R sided chest discomfort that was fleeting & now has bilateral shoulder pain.  She says the pain & swelling in the R shoulder also goes down her arm.    Her exam is essentially benign.  He pain is completely reversible.  2nd Troponin is Negative.  She indicates that she  would like to go home.  I agree that her symptoms are not cardiac in nature.   She should be fine for discharge home.  Follow up with Dr. Jomarie Longs as scheduled.   MD Time with pt: 10 min  Elyssia Strausser W, M.D., M.S. [facility] [address] [place], [place]  [address]  [phone] Pager # [phone] 07/04/2013 6:39 PM

## 2013-07-04 NOTE — ED Notes (Signed)
Pt has multiple complaints. Having palpitations and chest pains, now pain radiates into her neck, shoulder and having pain into right arm. Having swelling to right arm and reports pain to her right leg. Airway intact, ekg done at triage.

## 2013-07-04 NOTE — Discharge Instructions (Signed)
We saw you in the ER for the chest pain/shortness of breath. °All of our cardiac workup is normal, including labs, EKG and chest X-RAY are normal. °We are not sure what is causing your discomfort, but we feel comfortable sending you home at this time. The workup in the ER is not complete, and you should follow up with your primary care doctor for further evaluation. ° ° °Chest Pain (Nonspecific) °It is often hard to give a specific diagnosis for the cause of chest pain. There is always a chance that your pain could be related to something serious, such as a heart attack or a blood clot in the lungs. You need to follow up with your caregiver for further evaluation. °CAUSES  °· Heartburn. °· Pneumonia or bronchitis. °· Anxiety or stress. °· Inflammation around your heart (pericarditis) or lung (pleuritis or pleurisy). °· A blood clot in the lung. °· A collapsed lung (pneumothorax). It can develop suddenly on its own (spontaneous pneumothorax) or from injury (trauma) to the chest. °· Shingles infection (herpes zoster virus). °The chest wall is composed of bones, muscles, and cartilage. Any of these can be the source of the pain. °· The bones can be bruised by injury. °· The muscles or cartilage can be strained by coughing or overwork. °· The cartilage can be affected by inflammation and become sore (costochondritis). °DIAGNOSIS  °Lab tests or other studies, such as X-rays, electrocardiography, stress testing, or cardiac imaging, may be needed to find the cause of your pain.  °TREATMENT  °· Treatment depends on what may be causing your chest pain. Treatment may include: °· Acid blockers for heartburn. °· Anti-inflammatory medicine. °· Pain medicine for inflammatory conditions. °· Antibiotics if an infection is present. °· You may be advised to change lifestyle habits. This includes stopping smoking and avoiding alcohol, caffeine, and chocolate. °· You may be advised to keep your head raised (elevated) when sleeping.  This reduces the chance of acid going backward from your stomach into your esophagus. °· Most of the time, nonspecific chest pain will improve within 2 to 3 days with rest and mild pain medicine. °HOME CARE INSTRUCTIONS  °· If antibiotics were prescribed, take your antibiotics as directed. Finish them even if you start to feel better. °· For the next few days, avoid physical activities that bring on chest pain. Continue physical activities as directed. °· Do not smoke. °· Avoid drinking alcohol. °· Only take over-the-counter or prescription medicine for pain, discomfort, or fever as directed by your caregiver. °· Follow your caregiver's suggestions for further testing if your chest pain does not go away. °· Keep any follow-up appointments you made. If you do not go to an appointment, you could develop lasting (chronic) problems with pain. If there is any problem keeping an appointment, you must call to reschedule. °SEEK MEDICAL CARE IF:  °· You think you are having problems from the medicine you are taking. Read your medicine instructions carefully. °· Your chest pain does not go away, even after treatment. °· You develop a rash with blisters on your chest. °SEEK IMMEDIATE MEDICAL CARE IF:  °· You have increased chest pain or pain that spreads to your arm, neck, jaw, back, or abdomen. °· You develop shortness of breath, an increasing cough, or you are coughing up blood. °· You have severe back or abdominal pain, feel nauseous, or vomit. °· You develop severe weakness, fainting, or chills. °· You have a fever. °THIS IS AN EMERGENCY. Do not wait to   see if the pain will go away. Get medical help at once. Call your local emergency services (911 in [place] Do not drive yourself to the hospital. °MAKE SURE YOU:  °· Understand these instructions. °· Will watch your condition. °· Will get help right away if you are not doing well or get worse. °Document Released: 09/06/2005 Document Revised: 02/19/2012 Document Reviewed:  07/02/2008 °[employer]® Patient Information ©2014 [employer]. ° °

## 2013-07-05 NOTE — ED Provider Notes (Signed)
I saw and evaluated the patient, reviewed the resident's note and I agree with the findings and plan.  Vague episodic chest pain with past history of CABG. Pain free currently. Benign exam. SE card asked to eval for disposition.   Loren Racer, MD 07/05/13 1115

## 2013-07-09 ENCOUNTER — Encounter: Payer: Self-pay | Admitting: Obstetrics

## 2013-07-09 ENCOUNTER — Ambulatory Visit (INDEPENDENT_AMBULATORY_CARE_PROVIDER_SITE_OTHER): Payer: Medicare Other | Admitting: Obstetrics

## 2013-07-09 VITALS — Ht 60.0 in | Wt 126.8 lb

## 2013-07-09 DIAGNOSIS — Z113 Encounter for screening for infections with a predominantly sexual mode of transmission: Secondary | ICD-10-CM | POA: Diagnosis not present

## 2013-07-09 DIAGNOSIS — N390 Urinary tract infection, site not specified: Secondary | ICD-10-CM | POA: Diagnosis not present

## 2013-07-09 DIAGNOSIS — Z124 Encounter for screening for malignant neoplasm of cervix: Secondary | ICD-10-CM

## 2013-07-09 LAB — POCT URINALYSIS DIPSTICK
Blood, UA: NEGATIVE
Glucose, UA: NEGATIVE
Ketones, UA: NEGATIVE
Leukocytes, UA: NEGATIVE
Nitrite, UA: NEGATIVE
Spec Grav, UA: 1.015
pH, UA: 5

## 2013-07-09 NOTE — Progress Notes (Signed)
.   Subjective:     Autumn Fitzgerald is a 67 y.o. female here for a problem exam.  Current complaints: Burning during urination, pelvic, and back pain for a couple of months.  Patient states that she went to her primary doctor (07/01/13) and was told that her urine culture was negative.   Personal health questionnaire reviewed: yes.   Gynecologic History No LMP recorded. Patient is postmenopausal. Contraception: none Last Pap: 2011. Results were: normal Last mammogram: 2014. Results were:normal  Obstetric History OB History   Grav Para Term Preterm Abortions TAB SAB Ect Mult Living   5 5 5       4      # Outc Date GA Lbr Len/2nd Wgt Sex Del Anes PTL Lv   1 TRM            2 TRM            3 TRM            4 TRM            5 TRM                The following portions of the patient's history were reviewed and updated as appropriate: allergies, current medications, past family history, past medical history, past social history, past surgical history and problem list.  Review of Systems Pertinent items are noted in HPI.    Objective:    General appearance: alert and no distress Breasts: normal appearance, no masses or tenderness Abdomen: normal findings: soft, non-tender Pelvic: external genitalia normal, no adnexal masses or tenderness, no cervical motion tenderness, rectovaginal septum normal, uterus normal size, shape, and consistency, vagina normal without discharge and cervix stenosed Extremities: extremities normal, atraumatic, no cyanosis or edema    Assessment:    Healthy female exam.    Plan:    Follow up in: 1 year.

## 2013-07-10 LAB — WET PREP BY MOLECULAR PROBE
Candida species: NEGATIVE
Gardnerella vaginalis: NEGATIVE
Trichomonas vaginosis: NEGATIVE

## 2013-07-10 LAB — PAP IG W/ RFLX HPV ASCU

## 2013-07-11 LAB — URINE CULTURE
Colony Count: NO GROWTH
Organism ID, Bacteria: NO GROWTH

## 2013-07-22 ENCOUNTER — Ambulatory Visit: Payer: Medicare Other | Attending: Physical Medicine and Rehabilitation | Admitting: Physical Therapy

## 2013-07-22 DIAGNOSIS — R293 Abnormal posture: Secondary | ICD-10-CM | POA: Insufficient documentation

## 2013-07-22 DIAGNOSIS — IMO0001 Reserved for inherently not codable concepts without codable children: Secondary | ICD-10-CM | POA: Insufficient documentation

## 2013-07-22 DIAGNOSIS — M542 Cervicalgia: Secondary | ICD-10-CM | POA: Insufficient documentation

## 2013-07-22 DIAGNOSIS — M545 Low back pain, unspecified: Secondary | ICD-10-CM | POA: Insufficient documentation

## 2013-07-22 DIAGNOSIS — M2550 Pain in unspecified joint: Secondary | ICD-10-CM | POA: Insufficient documentation

## 2013-07-23 ENCOUNTER — Encounter: Payer: Self-pay | Admitting: Cardiovascular Disease

## 2013-07-23 ENCOUNTER — Ambulatory Visit (INDEPENDENT_AMBULATORY_CARE_PROVIDER_SITE_OTHER): Payer: Medicare Other | Admitting: Cardiovascular Disease

## 2013-07-23 ENCOUNTER — Ambulatory Visit: Payer: Medicare Other | Admitting: Rehabilitation

## 2013-07-23 VITALS — BP 118/72 | HR 80 | Resp 16 | Ht 60.0 in | Wt 128.8 lb

## 2013-07-23 DIAGNOSIS — I421 Obstructive hypertrophic cardiomyopathy: Secondary | ICD-10-CM | POA: Insufficient documentation

## 2013-07-23 DIAGNOSIS — R002 Palpitations: Secondary | ICD-10-CM

## 2013-07-23 DIAGNOSIS — I422 Other hypertrophic cardiomyopathy: Secondary | ICD-10-CM

## 2013-07-23 DIAGNOSIS — E785 Hyperlipidemia, unspecified: Secondary | ICD-10-CM

## 2013-07-23 DIAGNOSIS — I447 Left bundle-branch block, unspecified: Secondary | ICD-10-CM | POA: Insufficient documentation

## 2013-07-23 NOTE — Progress Notes (Signed)
In office loop interrogation. Normal function. No changes made this session.

## 2013-07-23 NOTE — Patient Instructions (Addendum)
Your physician recommends that you schedule a follow-up appointment in: 12 months Continue loop recorder downloads once a month.

## 2013-07-25 NOTE — Progress Notes (Signed)
Patient ID: Autumn Fitzgerald, female   DOB: 08-11-1946, 67 y.o.   MRN: [id number]     Reason for office visit Followup recurrent near-syncope, hypertrophic cardiomyopathy  Autumn Fitzgerald has had recurrent episodes of palpitations dizziness lightheadedness associated with marked anxiety and probably some hyperventilation. She has not had any syncopal event since implantation of a loop recorder. We performed a followup echocardiogram shows evidence of concentric left ventricular hypertrophy and an area of previous septal myectomy. There is no residual LDL for tract obstruction either at rest or following provocative maneuvers. Today she is smiling and feels better. She is frustrated that her symptoms have not recurred so that we can analyze her rhythm at the time.  She has also come to the conclusion that her chest pain and headaches are both related to severe cervical spine degenerative disc disease.   No Known Allergies  Current Outpatient Prescriptions  Medication Sig Dispense Refill  . acetaminophen (TYLENOL) 500 MG tablet Take 500 mg by mouth every 6 (six) hours as needed for pain.      Marland Kitchen atenolol (TENORMIN) 25 MG tablet Take 25 mg by mouth daily as needed (blood pressure goes up).       . cholecalciferol (VITAMIN D) 1000 UNITS tablet Take 5,000 Units by mouth daily.        . clonazePAM (KLONOPIN) 1 MG tablet Take 1-2 mg by mouth 3 (three) times daily as needed for anxiety (take 1 thru out the day if needed and take 2 tabelts at bedtime).      . ezetimibe-simvastatin (VYTORIN) 10-40 MG per tablet Take 0.5 tablets by mouth at bedtime.       Marland Kitchen FLUoxetine (PROZAC) 20 MG capsule Take 20 mg by mouth daily.      Marland Kitchen lactobacillus acidophilus (BACID) TABS Take 1 tablet by mouth daily.      . Misc Natural Products (OSTEO BI-FLEX ADV DOUBLE ST) TABS Take 1 tablet by mouth 2 (two) times daily.      . mometasone (ASMANEX) 220 MCG/INH inhaler Inhale 1 puff into the lungs daily as needed (for shortness of  breath).       . Multiple Vitamin (MULTIVITAMIN) tablet Take 1 tablet by mouth daily.      . pantoprazole (PROTONIX) 40 MG tablet Take 40 mg by mouth daily.      Bertram Gala Glycol-Propyl Glycol (SYSTANE) 0.4-0.3 % SOLN Apply 1 drop to eye 2 (two) times daily.       . vitamin B-12 (CYANOCOBALAMIN) 1000 MCG tablet Take 5,000 mcg by mouth daily.       No current facility-administered medications for this visit.    Past Medical History  Diagnosis Date  . Panic attacks   . 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 GERD (gastroesophageal reflux disease)   . HLD (hyperlipidemia)   . Asthma   . Palpitations   . Near syncope   . Fibromyalgia   . Chest wall pain     Past Surgical History  Procedure Laterality Date  . 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 Tubal ligation    . Cholecystectomy    . Loop recorder implant  03/18/2013  . Septal myectomy    . Nuclear stress test  11/30/2011    No ischemia  . Doppler echocardiography  03/14/2013    EF 65-70%,trivial AI,mild-mod. MR,Mod. TR,mod. concentric hypertrophy    Family History  Problem Relation Age of Onset  . Cancer Mother     breast  . Hypertension Mother   .  Cancer Father     prostate  . Diabetes Sister   . Heart attack Sister     History   Social History  . Marital Status: Divorced    Spouse Name: N/A    Number of Children: N/A  . Years of Education: N/A   Occupational History  . Not on file.   Social History Main Topics  . Smoking status: Never Smoker   . Smokeless tobacco: Not on file  . Alcohol Use: No  . Drug Use: No  . Sexual Activity: No     Comment: divorced   Other Topics Concern  . Not on file   Social History Narrative  . No narrative on file    Review of systems: The patient specifically denies any chest pain at rest or with exertion, dyspnea at rest or with exertion, orthopnea, paroxysmal nocturnal dyspnea, syncope, palpitations, focal neurological deficits, intermittent  claudication, lower extremity edema, unexplained weight gain, cough, hemoptysis or wheezing.  The patient also denies abdominal pain, nausea, vomiting, dysphagia, diarrhea, constipation, polyuria, polydipsia, dysuria, hematuria, frequency, urgency, abnormal bleeding or bruising, fever, chills, unexpected weight changes, mood swings, change in skin or hair texture, change in voice quality, auditory or visual problems, allergic reactions or rashes, she has numerous new musculoskeletal complaints primarily involving her neck upper back and lower back  PHYSICAL EXAM BP 118/72  Pulse 80  Resp 16  Ht 5' (1.524 m)  Wt 128 lb 12.8 oz (58.423 kg)  BMI 25.15 kg/m2  General: Alert, oriented x3, no distress Head: no evidence of trauma, PERRL, EOMI, no exophtalmos or lid lag, no myxedema, no xanthelasma; normal ears, nose and oropharynx Neck: normal jugular venous pulsations and no hepatojugular reflux; brisk carotid pulses without delay and no carotid bruits Chest: clear to auscultation, no signs of consolidation by percussion or palpation, normal fremitus, symmetrical and full respiratory excursions Cardiovascular: normal position and quality of the apical impulse, regular rhythm, normal first and second heart sounds, no murmurs, rubs  but there is a distinct fourth heart soundAbdomen: no tenderness or distention, no masses by palpation, no abnormal pulsatility or arterial bruits, normal bowel sounds, no hepatosplenomegaly Extremities: no clubbing, cyanosis or edema; 2+ radial, ulnar and brachial pulses bilaterally; 2+ right femoral, posterior tibial and dorsalis pedis pulses; 2+ left femoral, posterior tibial and dorsalis pedis pulses; no subclavian or femoral bruits Neurological: grossly nonfocal  EKG:  sinus rhythm, left bundle branch block   Lipid Panel     Component Value Date/Time   CHOL 145 09/23/2008 2026   TRIG 94 09/23/2008 2026   HDL 53 09/23/2008 2026   CHOLHDL 2.7 Ratio 09/23/2008 2026     VLDL 19 09/23/2008 2026   LDLCALC 73 09/23/2008 2026    BMET    Component Value Date/Time   NA 139 07/04/2013 1201   K 4.0 07/04/2013 1201   CL 100 07/04/2013 1201   CO2 28 07/04/2013 1201   GLUCOSE 115* 07/04/2013 1201   BUN 15 07/04/2013 1201   CREATININE 0.76 07/04/2013 1201   CALCIUM 9.2 07/04/2013 1201   GFRNONAA 85* 07/04/2013 1201   GFRAA >90 07/04/2013 1201     ASSESSMENT AND PLAN LBBB (left bundle branch block) At risk for development of complete heart block. Also at risk for ventricular tachyarrhythmia secondary to underlying cardiomyopathy and previous myectomy scar. For the time being the benefit of beta blocker seems to greatly outweigh the risks. Status post loop recorder implantation. No recurrent syncope since device implantation. Will schedule  for monthly remote [employer] downloads.   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 Doppler studies do not suggest decompensated heart failure.  HLD (hyperlipidemia) Lipid parameters are within the desirable range  Jearlean Demauro  Thurmon Fair, MD, [facility] [phone] office [phone] pager

## 2013-07-25 NOTE — Assessment & Plan Note (Addendum)
At risk for development of complete heart block. Also at risk for ventricular tachyarrhythmia secondary to underlying cardiomyopathy and previous myectomy scar. For the time being the benefit of beta blocker seems to greatly outweigh the risks. Status post loop recorder implantation. No recurrent syncope since device implantation. Will schedule for monthly remote [employer] downloads.

## 2013-07-25 NOTE — Assessment & Plan Note (Signed)
Lipid parameters are within the desirable range 

## 2013-07-25 NOTE — Assessment & Plan Note (Deleted)
There is evidence of previous septal myectomy without any residual left ventricular for tract obstruction at rest or with a Valsalva maneuver by echo. Doppler studies do not suggest decompensated heart failure. 

## 2013-07-25 NOTE — Assessment & Plan Note (Signed)
There is evidence of previous septal myectomy without any residual left ventricular for tract obstruction at rest or with a Valsalva maneuver by echo. Doppler studies do not suggest decompensated heart failure.

## 2013-07-29 ENCOUNTER — Ambulatory Visit: Payer: Medicare Other | Admitting: Rehabilitation

## 2013-07-31 ENCOUNTER — Ambulatory Visit: Payer: Medicare Other | Admitting: Physical Therapy

## 2013-08-06 ENCOUNTER — Ambulatory Visit: Payer: Medicare Other | Admitting: Rehabilitation

## 2013-08-07 ENCOUNTER — Ambulatory Visit: Payer: Medicare Other | Admitting: Rehabilitation

## 2013-08-13 ENCOUNTER — Ambulatory Visit: Payer: Medicare Other | Attending: Physical Medicine and Rehabilitation | Admitting: Rehabilitation

## 2013-08-13 DIAGNOSIS — R293 Abnormal posture: Secondary | ICD-10-CM | POA: Insufficient documentation

## 2013-08-13 DIAGNOSIS — M545 Low back pain, unspecified: Secondary | ICD-10-CM | POA: Insufficient documentation

## 2013-08-13 DIAGNOSIS — M542 Cervicalgia: Secondary | ICD-10-CM | POA: Insufficient documentation

## 2013-08-13 DIAGNOSIS — IMO0001 Reserved for inherently not codable concepts without codable children: Secondary | ICD-10-CM | POA: Insufficient documentation

## 2013-08-13 DIAGNOSIS — M2550 Pain in unspecified joint: Secondary | ICD-10-CM | POA: Insufficient documentation

## 2013-08-15 ENCOUNTER — Other Ambulatory Visit: Payer: Self-pay | Admitting: Gastroenterology

## 2013-08-18 DIAGNOSIS — M5030 Other cervical disc degeneration, unspecified cervical region: Secondary | ICD-10-CM | POA: Diagnosis not present

## 2013-08-19 ENCOUNTER — Ambulatory Visit: Payer: Medicare Other | Admitting: Rehabilitation

## 2013-08-20 ENCOUNTER — Ambulatory Visit: Payer: Medicare Other | Admitting: Physical Therapy

## 2013-08-30 ENCOUNTER — Other Ambulatory Visit: Payer: Self-pay | Admitting: Cardiovascular Disease

## 2013-08-30 DIAGNOSIS — I421 Obstructive hypertrophic cardiomyopathy: Secondary | ICD-10-CM

## 2013-08-30 LAB — PACEMAKER DEVICE OBSERVATION

## 2013-09-01 ENCOUNTER — Ambulatory Visit: Payer: Medicare Other | Admitting: Physical Therapy

## 2013-09-02 ENCOUNTER — Telehealth: Payer: Self-pay | Admitting: Cardiovascular Disease

## 2013-09-02 NOTE — Telephone Encounter (Signed)
Will fax device letter tomorrow. Levora Angel, can you get ready please?

## 2013-09-02 NOTE — Telephone Encounter (Signed)
Patient needs to have MRI of knee---she has a loop recorder needs ok to ----ordering Dr. Sheran Luz  Phone #  . [phone] fax [phone]

## 2013-09-02 NOTE — Telephone Encounter (Signed)
Message forwarded to Dr. Croitoru/Barbara, CMA 

## 2013-09-03 ENCOUNTER — Encounter: Payer: Self-pay | Admitting: *Deleted

## 2013-09-03 ENCOUNTER — Encounter: Payer: Self-pay | Admitting: Physical Therapy

## 2013-09-03 NOTE — Telephone Encounter (Signed)
Spoke to [place] with Dr.Ramos' ofc regarding pt MRI. I informed Tresa Endo that it is ok for the patient to have an MRI being that she only has a Linq. I asked her what type of form they needed stating that, but she said it looked like a verbal "ok" would suffice. Contact information was given to [place] in the event that a note of some sort was needed.

## 2013-09-06 ENCOUNTER — Encounter: Payer: Self-pay | Admitting: *Deleted

## 2013-09-10 ENCOUNTER — Encounter: Payer: Self-pay | Admitting: *Deleted

## 2013-09-10 DIAGNOSIS — M159 Polyosteoarthritis, unspecified: Secondary | ICD-10-CM | POA: Insufficient documentation

## 2013-09-10 LAB — REMOTE PACEMAKER DEVICE

## 2013-09-11 ENCOUNTER — Encounter: Payer: Self-pay | Admitting: Neurology

## 2013-09-11 ENCOUNTER — Ambulatory Visit (INDEPENDENT_AMBULATORY_CARE_PROVIDER_SITE_OTHER): Payer: Medicare Other | Admitting: Neurology

## 2013-09-11 VITALS — BP 112/68 | HR 80 | Ht 60.0 in | Wt 129.0 lb

## 2013-09-11 DIAGNOSIS — G44309 Post-traumatic headache, unspecified, not intractable: Secondary | ICD-10-CM

## 2013-09-11 DIAGNOSIS — M25569 Pain in unspecified knee: Secondary | ICD-10-CM

## 2013-09-11 DIAGNOSIS — Z9181 History of falling: Secondary | ICD-10-CM

## 2013-09-11 DIAGNOSIS — M25561 Pain in right knee: Secondary | ICD-10-CM

## 2013-09-11 MED ORDER — AMITRIPTYLINE HCL 25 MG PO TABS: ORAL_TABLET | ORAL | Status: DC

## 2013-09-11 NOTE — Progress Notes (Signed)
Subjective:    Patient ID: Autumn Fitzgerald is a 67 y.o. female.  HPI  Huston Foley, MD, PhD [facility] [address] [address] [place], [place] [address]  Dear Dr. Ethelene Hal,    I saw your patient, Autumn Fitzgerald, upon your kind request in my neurologic clinic today for initial consultation of her headaches after a fall and memory loss as well as dizziness. The patient is accompanied by her husband today. As you know, [name] is a very friendly 67 year old right-handed woman with an underlying medical history of chronic back pain and cervical as well as lumbar degenerative disc disease, hyperlipidemia, reflux disease, recurrent headaches, asthma, anxiety and depression as well as congestive heart failure, who fell in the bathtub in 3/14 and hit her head in the front. She did not have LOC. Some 2 years ago, she fell and hit the front of her head in the front. She did not have LOC. She has not had a CTH or MRI. She has had forgetfulness since the fall in March. Her mother had AD. She endorses mostly short term memory issues, no confusion, delusions, hallucinations. She takes medicine for anxiety and depression and panic attack and sees Dr. Donell Beers. She has a implanted loop recorder, which was placed several months ago. She is divorced and lives alone. She has 4 grown children. All of them in [place]. She does not drive.  She has a HA every day, generalized, more in the vertex and bifrontal, and aching, not pounding, no N/V and with photophobia. She takes Klonopin 2 mg qHS, which helps her sleep for about 4 hours, she goes to bed late, between 12 MN and 1 AM. She snores some, but denies choking and has never been told, that she has apneic pauses.   Her Past Medical History Is Significant For: Past Medical History  Diagnosis Date  . Panic attacks   . 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 GERD (gastroesophageal reflux disease)   . HLD  (hyperlipidemia)   . Asthma   . Palpitations   . Near syncope   . Fibromyalgia   . Chest wall pain     Her Past Surgical History Is Significant For: Past Surgical History  Procedure Laterality Date  . 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 Tubal ligation    . Cholecystectomy    . Loop recorder implant  03/18/2013  . Septal myectomy    . Nuclear stress test  11/30/2011    No ischemia  . Doppler echocardiography  03/14/2013    EF 65-70%,trivial AI,mild-mod. MR,Mod. TR,mod. concentric hypertrophy    Her Family History Is Significant For: Family History  Problem Relation Age of Onset  . Cancer Mother     breast  . Hypertension Mother   . Cancer Father     prostate  . Diabetes Sister   . Heart attack Sister     Her Social History Is Significant For: History   Social History  . Marital Status: Divorced    Spouse Name: N/A    Number of Children: N/A  . Years of Education: N/A   Social History Main Topics  . Smoking status: Never Smoker   . Smokeless tobacco: None  . Alcohol Use: No  . Drug Use: No  . Sexual Activity: No     Comment: divorced   Other Topics Concern  . None   Social History Narrative  . None    Her Allergies Are:  No Known  Allergies:   Her Current Medications Are:  Outpatient Encounter Prescriptions as of 09/11/2013  Medication Sig Dispense Refill  . acetaminophen (TYLENOL) 500 MG tablet Take 500 mg by mouth every 6 (six) hours as needed for pain.      Marland Kitchen atenolol (TENORMIN) 25 MG tablet Take 25 mg by mouth daily as needed (blood pressure goes up).       . cholecalciferol (VITAMIN D) 1000 UNITS tablet Take 5,000 Units by mouth daily.        . clonazePAM (KLONOPIN) 1 MG tablet Take 1-2 mg by mouth 3 (three) times daily as needed for anxiety (take 1 thru out the day if needed and take 2 tabelts at bedtime).      . ezetimibe-simvastatin (VYTORIN) 10-40 MG per tablet Take 0.5 tablets by mouth at bedtime.       Marland Kitchen FLUoxetine (PROZAC) 20 MG capsule  Take 20 mg by mouth daily.      Marland Kitchen lactobacillus acidophilus (BACID) TABS Take 1 tablet by mouth daily.      . Misc Natural Products (OSTEO BI-FLEX ADV DOUBLE ST) TABS Take 1 tablet by mouth 2 (two) times daily.      . mometasone (ASMANEX) 220 MCG/INH inhaler Inhale 1 puff into the lungs daily as needed (for shortness of breath).       . Multiple Vitamin (MULTIVITAMIN) tablet Take 1 tablet by mouth daily.      . pantoprazole (PROTONIX) 40 MG tablet Take 40 mg by mouth daily.      Bertram Gala Glycol-Propyl Glycol (SYSTANE) 0.4-0.3 % SOLN Apply 1 drop to eye 2 (two) times daily.       . predniSONE (DELTASONE) 10 MG tablet Take 1 tablet by mouth daily.      . vitamin B-12 (CYANOCOBALAMIN) 1000 MCG tablet Take 5,000 mcg by mouth daily.       No facility-administered encounter medications on file as of 09/11/2013.  :  Review of Systems:  Out of a complete 14 point review of systems, all are reviewed and negative with the exception of these symptoms as listed below: Review of Systems  Constitutional: Positive for activity change and fatigue.  HENT: Positive for hearing loss and rhinorrhea.   Eyes: Positive for pain and visual disturbance.  Respiratory: Positive for cough and shortness of breath.        Snoring  Cardiovascular: Positive for chest pain and palpitations.  Musculoskeletal: Positive for myalgias and arthralgias.  Neurological: Positive for dizziness and headaches.       Memory loss  Psychiatric/Behavioral: Positive for confusion, sleep disturbance and dysphoric mood. The patient is nervous/anxious.     Objective:  Neurologic Exam  Physical Exam Physical Examination:   Filed Vitals:   09/11/13 1421  BP: 112/68  Pulse: 80    General Examination: The patient is a very pleasant 67 y.o. female in no acute distress. She appears well-developed and well-nourished and well groomed.   HEENT: Normocephalic, atraumatic, pupils are equal, round and reactive to light and  accommodation. Funduscopic exam is normal with sharp disc margins noted. Extraocular tracking is good without limitation to gaze excursion or nystagmus noted. Normal smooth pursuit is noted. Hearing is grossly intact. Tympanic membranes are clear bilaterally. Face is symmetric with normal facial animation and normal facial sensation. Speech is clear with no dysarthria noted. There is no hypophonia. There is no lip, neck/head, jaw or voice tremor. Neck is supple with full range of passive and active motion. There are no carotid bruits  on auscultation. Oropharynx exam reveals: mild mouth dryness, adequate dental hygiene and mild airway crowding, due to narrow airway. Mallampati is class II. Tongue protrudes centrally and palate elevates symmetrically.   Chest: Clear to auscultation without wheezing, rhonchi or crackles noted.  Heart: S1+S2+0, regular and normal without murmurs, rubs or gallops noted.   Abdomen: Soft, non-tender and non-distended with normal bowel sounds appreciated on auscultation.  Extremities: There is no pitting edema in the distal lower extremities bilaterally. Pedal pulses are intact.  Skin: Warm and dry without trophic changes noted. There are no varicose veins.  Musculoskeletal: exam reveals no obvious joint deformities, but R knee tenderness and she wears a velcro brace on the R knee.   Neurologically:  Mental status: The patient is awake, alert and oriented in all 4 spheres. Her memory, attention, language and knowledge are appropriate. There is no aphasia, agnosia, apraxia or anomia. Speech is clear with normal prosody and enunciation. Thought process is linear. Mood is congruent and affect is constricted.  Cranial nerves are as described above under HEENT exam. In addition, shoulder shrug is normal with equal shoulder height noted. Motor exam: Normal bulk, strength and tone is noted. There is no drift, tremor or rebound. Romberg is negative. Reflexes are 2+ throughout. Toes  are downgoing bilaterally. Fine motor skills are intact with normal finger taps, normal hand movements, normal rapid alternating patting, normal foot taps and normal foot agility.  Cerebellar testing shows no dysmetria or intention tremor on finger to nose testing. Heel to shin is unremarkable bilaterally. There is no truncal or gait ataxia.  Sensory exam is intact to light touch, pinprick, vibration, temperature sense and proprioception in the upper and lower extremities.  Gait, station and balance: she stands slowly, c/o R knee pain. She walks cautiously, with a limp on the R. No veering to one side is noted. No leaning to one side is noted. Posture is age-appropriate and stance is narrow based. No problems turning are noted. She turns en bloc. Tandem walk is not possible and she cannot do toe and heel stance is noted d/t knee pain.               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  Her physical exam is fairly non-focal and I reassured the patient in that regard.  I had a long chat with the patient about my findings and the diagnosis of post-traumatic headaches, the prognosis and treatment options. We talked about medical treatments and non-pharmacological approaches. We talked about maintaining a healthy lifestyle in general. I encouraged the patient to eat healthy, exercise daily and keep well hydrated, to keep a scheduled bedtime and wake time routine, to not skip any meals and eat healthy snacks in between meals and to have protein with every meal.   I advised the patient about common headache triggers: sleep deprivation, dehydration, overheating, stress, hypoglycemia or skipping meals and blood sugar fluctuations, excessive pain medications or excessive alcohol use or caffeine withdrawal. Some people have  food triggers such as aged cheese, orange juice or chocolate, especially dark chocolate, or MSG (monosodium glutamate). She is to try to avoid these headache triggers as much possible. It may be helpful to keep a headache diary to figure out what makes Her headaches worse or brings them on and what  alleviates them. Some people report headache onset after exercise but studies have shown that regular exercise may actually prevent headaches from coming. If She has exercise-induced headaches, She is advised to drink plenty of fluid before and after exercising and that to not overdo it and to not overheat.  As far as further diagnostic testing is concerned, I suggested the following today: CT brain w/o contrast.  As far as medications are concerned, I recommended the following at this time: trial of Elavil (generic name: amitriptyline) 25 mg: Take half a pill daily at bedtime for one week, then one pill daily at bedtime thereafter. Common side effects reported are: mouth dryness, drowsiness, confusion, dizziness. The patient was advised about potential side effects.   I answered all her questions today and the patient was in agreement with the above outlined plan. I would like to see the patient back in 3 months, sooner if the need arises and encouraged her to call with any interim questions, concerns, problems or updates and refill requests and test results.   Thank you very much for allowing me to participate in the care of this nice patient. If I can be of any further assistance to you please do not hesitate to call me at [phone].  Sincerely,   Huston Foley, MD, PhD

## 2013-09-11 NOTE — Patient Instructions (Addendum)
Please remember, common headache triggers are: sleep deprivation, dehydration, overheating, stress, hypoglycemia or skipping meals and blood sugar fluctuations, excessive pain medications or excessive alcohol use or caffeine withdrawal. Some people have food triggers such as aged cheese, orange juice or chocolate, especially dark chocolate, or MSG (monosodium glutamate). Try to avoid these headache triggers as much possible. It may be helpful to keep a headache diary to figure out what makes your headaches worse or brings them on and what alleviates them. Some people report headache onset after exercise but studies have shown that regular exercise may actually prevent headaches from coming. If you have exercise-induced headaches, please make sure that you drink plenty of fluid before and after exercising and that you do not over do it and do not overheat.  We will do a head CT, and try you on a medication called: Elavil (generic name: amitriptyline) 25 mg: Take half a pill daily at bedtime for one week, then one pill daily at bedtime thereafter. Common side effects reported are: mouth dryness, drowsiness, confusion, dizziness.   Use your cane at all time.

## 2013-09-21 LAB — PACEMAKER DEVICE OBSERVATION

## 2013-09-22 ENCOUNTER — Ambulatory Visit (INDEPENDENT_AMBULATORY_CARE_PROVIDER_SITE_OTHER): Payer: Medicare Other | Admitting: *Deleted

## 2013-09-22 DIAGNOSIS — R002 Palpitations: Secondary | ICD-10-CM

## 2013-09-22 DIAGNOSIS — I421 Obstructive hypertrophic cardiomyopathy: Secondary | ICD-10-CM

## 2013-09-23 ENCOUNTER — Ambulatory Visit
Admission: RE | Admit: 2013-09-23 | Discharge: 2013-09-23 | Disposition: A | Discharge status: Home / Self Care | Payer: Medicare Other | Source: Ambulatory Visit | Attending: Neurology | Admitting: Neurology

## 2013-09-23 DIAGNOSIS — R51 Headache: Secondary | ICD-10-CM

## 2013-09-23 DIAGNOSIS — G44309 Post-traumatic headache, unspecified, not intractable: Secondary | ICD-10-CM

## 2013-09-24 ENCOUNTER — Encounter: Payer: Self-pay | Admitting: Cardiovascular Disease

## 2013-09-26 NOTE — Progress Notes (Signed)
Quick Note:  Please call and advise the patient that the recent scan we did was within normal limits. We did a brain CT without contrast and it was reported as normal. In particular, there were no acute findings, such as a stroke, or mass or blood products. No further action is required on this test at this time. Please remind patient to keep any upcoming appointments or tests and to call us with any interim questions, concerns, problems or updates. Thanks,  Huston Foley, MD, PhD   ______

## 2013-09-30 NOTE — Progress Notes (Signed)
Quick Note:  I called and LMVM for pt that CT head normal study. She is to call back for questions. ______

## 2013-10-14 ENCOUNTER — Encounter (HOSPITAL_COMMUNITY): Payer: Self-pay | Admitting: Pharmacy Technician

## 2013-10-16 ENCOUNTER — Telehealth: Payer: Self-pay | Admitting: Cardiovascular Disease

## 2013-10-16 ENCOUNTER — Encounter (HOSPITAL_COMMUNITY): Payer: Self-pay | Admitting: *Deleted

## 2013-10-16 NOTE — Telephone Encounter (Signed)
Needs to know if a surgical clearance has been done  And also faxing device instruction form to be completed.  Surgery tomorrow.  Please call

## 2013-10-16 NOTE — Telephone Encounter (Signed)
Message forwarded to Dr. Croitoru/Barbara, CMA 

## 2013-10-16 NOTE — Progress Notes (Signed)
Pt states that she takes Atenolol as needed for high blood pressure, "I dont eat much salt and it is better."  "I am not going to take any medications in the morning,I dont want to get sick, I am not going to eat or drink anything."

## 2013-10-17 ENCOUNTER — Encounter (HOSPITAL_COMMUNITY): Payer: Medicare Other | Admitting: Anesthesiology

## 2013-10-17 ENCOUNTER — Ambulatory Visit (HOSPITAL_COMMUNITY): Payer: Medicare Other | Admitting: Anesthesiology

## 2013-10-17 ENCOUNTER — Encounter (HOSPITAL_COMMUNITY)
Admission: RE | Disposition: A | Discharge status: Home / Self Care | Payer: Self-pay | Source: Ambulatory Visit | Attending: Orthopedic Surgery

## 2013-10-17 ENCOUNTER — Encounter (HOSPITAL_COMMUNITY): Payer: Self-pay | Admitting: *Deleted

## 2013-10-17 ENCOUNTER — Ambulatory Visit (HOSPITAL_COMMUNITY)
Admission: RE | Admit: 2013-10-17 | Discharge: 2013-10-17 | Disposition: A | Discharge status: Home / Self Care | Payer: Medicare Other | Source: Ambulatory Visit | Attending: Orthopedic Surgery | Admitting: Orthopedic Surgery

## 2013-10-17 DIAGNOSIS — M2240 Chondromalacia patellae, unspecified knee: Secondary | ICD-10-CM | POA: Insufficient documentation

## 2013-10-17 DIAGNOSIS — I447 Left bundle-branch block, unspecified: Secondary | ICD-10-CM | POA: Insufficient documentation

## 2013-10-17 DIAGNOSIS — I10 Essential (primary) hypertension: Secondary | ICD-10-CM | POA: Insufficient documentation

## 2013-10-17 DIAGNOSIS — F329 Major depressive disorder, single episode, unspecified: Secondary | ICD-10-CM | POA: Insufficient documentation

## 2013-10-17 DIAGNOSIS — I421 Obstructive hypertrophic cardiomyopathy: Secondary | ICD-10-CM | POA: Insufficient documentation

## 2013-10-17 DIAGNOSIS — E785 Hyperlipidemia, unspecified: Secondary | ICD-10-CM | POA: Insufficient documentation

## 2013-10-17 DIAGNOSIS — M23302 Other meniscus derangements, unspecified lateral meniscus, unspecified knee: Secondary | ICD-10-CM | POA: Insufficient documentation

## 2013-10-17 DIAGNOSIS — M23305 Other meniscus derangements, unspecified medial meniscus, unspecified knee: Secondary | ICD-10-CM | POA: Insufficient documentation

## 2013-10-17 DIAGNOSIS — K219 Gastro-esophageal reflux disease without esophagitis: Secondary | ICD-10-CM | POA: Insufficient documentation

## 2013-10-17 DIAGNOSIS — F3289 Other specified depressive episodes: Secondary | ICD-10-CM | POA: Insufficient documentation

## 2013-10-17 HISTORY — DX: Unspecified osteoarthritis, unspecified site: M19.90

## 2013-10-17 HISTORY — DX: Depression, unspecified: F32.A

## 2013-10-17 HISTORY — PX: KNEE ARTHROSCOPY: SHX127

## 2013-10-17 HISTORY — DX: Unspecified injury of head, initial encounter: S09.90XA

## 2013-10-17 HISTORY — DX: Major depressive disorder, single episode, unspecified: F32.9

## 2013-10-17 HISTORY — DX: Essential (primary) hypertension: I10

## 2013-10-17 LAB — BASIC METABOLIC PANEL
BUN: 12 mg/dL (ref 6–23)
CO2: 25 mEq/L (ref 19–32)
Calcium: 9 mg/dL (ref 8.4–10.5)
Chloride: 104 mEq/L (ref 96–112)
Creatinine, Ser: 0.71 mg/dL (ref 0.50–1.10)
GFR calc Af Amer: >90 mL/min (ref >90)
GFR calc non Af Amer: 87 mL/min — ABNORMAL LOW (ref >90)
Glucose, Bld: 83 mg/dL (ref 70–99)
Potassium: 4 mEq/L (ref 3.5–5.1)
Sodium: 139 mEq/L (ref 135–145)

## 2013-10-17 LAB — NO BLOOD PRODUCTS

## 2013-10-17 LAB — CBC
HCT: 38.2 % (ref 36.0–46.0)
Hemoglobin: 12.9 g/dL (ref 12.0–15.0)
MCH: 29.3 pg (ref 26.0–34.0)
MCHC: 33.8 g/dL (ref 30.0–36.0)
MCV: 86.6 fL (ref 78.0–100.0)
Platelets: 225 10*3/uL (ref 150–400)
RBC: 4.41 MIL/uL (ref 3.87–5.11)
RDW: 13.5 % (ref 11.5–15.5)
WBC: 4.5 10*3/uL (ref 4.0–10.5)

## 2013-10-17 SURGERY — ARTHROSCOPY, KNEE
Anesthesia: General | Site: Knee | Laterality: Right | Wound class: Clean

## 2013-10-17 MED ORDER — SODIUM CHLORIDE 0.9 % IR SOLN
Status: DC | PRN
  Administered 2013-10-17: 6000 mL

## 2013-10-17 MED ORDER — LIDOCAINE HCL (CARDIAC) 20 MG/ML IV SOLN
INTRAVENOUS | Status: DC | PRN
  Administered 2013-10-17: 60 mg via INTRAVENOUS

## 2013-10-17 MED ORDER — CEFAZOLIN SODIUM-DEXTROSE 2-3 GM-% IV SOLR
2.0000 g | Freq: Once | INTRAVENOUS | Status: AC
  Administered 2013-10-17: 2 g via INTRAVENOUS
  Filled 2013-10-17: qty 50

## 2013-10-17 MED ORDER — METOCLOPRAMIDE HCL 5 MG/ML IJ SOLN
10.0000 mg | Freq: Once | INTRAMUSCULAR | Status: AC | PRN
  Administered 2013-10-17: 10 mg via INTRAVENOUS

## 2013-10-17 MED ORDER — BUPIVACAINE-EPINEPHRINE PF 0.25-1:200000 % IJ SOLN
INTRAMUSCULAR | Status: AC
  Filled 2013-10-17: qty 30

## 2013-10-17 MED ORDER — KETOROLAC TROMETHAMINE 15 MG/ML IJ SOLN
15.0000 mg | Freq: Once | INTRAMUSCULAR | Status: AC
  Administered 2013-10-17: 15 mg via INTRAVENOUS

## 2013-10-17 MED ORDER — PHENYLEPHRINE HCL 10 MG/ML IJ SOLN
INTRAMUSCULAR | Status: DC | PRN
  Administered 2013-10-17 (×4): 80 ug via INTRAVENOUS

## 2013-10-17 MED ORDER — HYDROMORPHONE HCL PF 1 MG/ML IJ SOLN: 0.2500 mg | INTRAMUSCULAR | Status: DC | PRN

## 2013-10-17 MED ORDER — PROPOFOL 10 MG/ML IV BOLUS
INTRAVENOUS | Status: DC | PRN
  Administered 2013-10-17: 120 mg via INTRAVENOUS

## 2013-10-17 MED ORDER — METHOCARBAMOL 500 MG PO TABS: 500.0000 mg | ORAL_TABLET | Freq: Three times a day (TID) | ORAL | Status: DC | PRN

## 2013-10-17 MED ORDER — OXYCODONE HCL 5 MG/5ML PO SOLN: 5.0000 mg | Freq: Once | ORAL | Status: DC | PRN

## 2013-10-17 MED ORDER — BUPIVACAINE-EPINEPHRINE 0.25% -1:200000 IJ SOLN
INTRAMUSCULAR | Status: DC | PRN
  Administered 2013-10-17: 10 mL

## 2013-10-17 MED ORDER — FENTANYL CITRATE 0.05 MG/ML IJ SOLN
INTRAMUSCULAR | Status: DC | PRN
  Administered 2013-10-17: 50 ug via INTRAVENOUS

## 2013-10-17 MED ORDER — OXYCODONE HCL 5 MG PO TABS: 5.0000 mg | ORAL_TABLET | Freq: Once | ORAL | Status: DC | PRN

## 2013-10-17 MED ORDER — LACTATED RINGERS IV SOLN
INTRAVENOUS | Status: DC
  Administered 2013-10-17: 50 mL/h via INTRAVENOUS

## 2013-10-17 MED ORDER — OXYCODONE-ACETAMINOPHEN 5-325 MG PO TABS: 1.0000 | ORAL_TABLET | ORAL | Status: DC | PRN

## 2013-10-17 MED ORDER — CEFAZOLIN SODIUM 1-5 GM-% IV SOLN
INTRAVENOUS | Status: AC
  Filled 2013-10-17: qty 100

## 2013-10-17 MED ORDER — METOCLOPRAMIDE HCL 5 MG/ML IJ SOLN
INTRAMUSCULAR | Status: AC
  Filled 2013-10-17: qty 2

## 2013-10-17 MED ORDER — ONDANSETRON HCL 4 MG/2ML IJ SOLN
INTRAMUSCULAR | Status: DC | PRN
  Administered 2013-10-17: 4 mg via INTRAVENOUS

## 2013-10-17 MED ORDER — MIDAZOLAM HCL 5 MG/5ML IJ SOLN
INTRAMUSCULAR | Status: DC | PRN
  Administered 2013-10-17: 2 mg via INTRAVENOUS

## 2013-10-17 SURGICAL SUPPLY — 41 items
BANDAGE ELASTIC 4 VELCRO ST LF (GAUZE/BANDAGES/DRESSINGS) IMPLANT
BANDAGE ELASTIC 6 VELCRO ST LF (GAUZE/BANDAGES/DRESSINGS) ×2 IMPLANT
BANDAGE GAUZE ELAST BULKY 4 IN (GAUZE/BANDAGES/DRESSINGS) ×4 IMPLANT
BLADE CUTTER GATOR 3.5 (BLADE) ×2 IMPLANT
CLOTH BEACON ORANGE TIMEOUT ST (SAFETY) ×2 IMPLANT
CLSR STERI-STRIP ANTIMIC 1/2X4 (GAUZE/BANDAGES/DRESSINGS) ×2 IMPLANT
DRAPE ARTHROSCOPY W/POUCH 114 (DRAPES) ×2 IMPLANT
DRSG EMULSION OIL 3X3 NADH (GAUZE/BANDAGES/DRESSINGS) ×2 IMPLANT
DRSG PAD ABDOMINAL 8X10 ST (GAUZE/BANDAGES/DRESSINGS) ×2 IMPLANT
DURAPREP 26ML APPLICATOR (WOUND CARE) ×2 IMPLANT
ELECT MENISCUS 165MM 90D (ELECTRODE) IMPLANT
ELECT REM PT RETURN 9FT ADLT (ELECTROSURGICAL) ×2
ELECTRODE REM PT RTRN 9FT ADLT (ELECTROSURGICAL) ×1 IMPLANT
GAUZE SPONGE 4X4 16PLY XRAY LF (GAUZE/BANDAGES/DRESSINGS) ×2 IMPLANT
GLOVE BIOGEL PI ORTHO PRO 7.5 (GLOVE)
GLOVE BIOGEL PI ORTHO PRO SZ7 (GLOVE) ×1
GLOVE BIOGEL PI ORTHO PRO SZ8 (GLOVE) ×1
GLOVE ORTHO TXT STRL SZ7.5 (GLOVE) IMPLANT
GLOVE PI ORTHO PRO STRL 7.5 (GLOVE) IMPLANT
GLOVE PI ORTHO PRO STRL SZ7 (GLOVE) ×1 IMPLANT
GLOVE PI ORTHO PRO STRL SZ8 (GLOVE) ×1 IMPLANT
GLOVE SURG ORTHO 8.5 STRL (GLOVE) ×2 IMPLANT
GLOVE SURG SS PI 7.0 STRL IVOR (GLOVE) ×2 IMPLANT
GOWN STRL NON-REIN LRG LVL3 (GOWN DISPOSABLE) IMPLANT
GOWN STRL REIN XL XLG (GOWN DISPOSABLE) ×4 IMPLANT
KIT BASIN OR (CUSTOM PROCEDURE TRAY) ×2 IMPLANT
KIT ROOM TURNOVER OR (KITS) ×2 IMPLANT
MANIFOLD NEPTUNE II (INSTRUMENTS) ×2 IMPLANT
PACK ARTHROSCOPY DSU (CUSTOM PROCEDURE TRAY) ×2 IMPLANT
PAD ARMBOARD 7.5X6 YLW CONV (MISCELLANEOUS) ×4 IMPLANT
PENCIL BUTTON HOLSTER BLD 10FT (ELECTRODE) ×2 IMPLANT
SET ARTHROSCOPY TUBING (MISCELLANEOUS) ×1
SET ARTHROSCOPY TUBING LN (MISCELLANEOUS) ×1 IMPLANT
SPONGE GAUZE 4X4 12PLY (GAUZE/BANDAGES/DRESSINGS) ×2 IMPLANT
STRIP CLOSURE SKIN 1/2X4 (GAUZE/BANDAGES/DRESSINGS) ×2 IMPLANT
SUT MNCRL AB 4-0 PS2 18 (SUTURE) ×2 IMPLANT
TOWEL OR 17X24 6PK STRL BLUE (TOWEL DISPOSABLE) ×4 IMPLANT
TUBE CONNECTING 12X1/4 (SUCTIONS) ×2 IMPLANT
WAND 90 DEG TURBOVAC W/CORD (SURGICAL WAND) IMPLANT
WATER STERILE IRR 1000ML POUR (IV SOLUTION) ×2 IMPLANT
WRAP KNEE MAXI GEL POST OP (GAUZE/BANDAGES/DRESSINGS) ×2 IMPLANT

## 2013-10-17 NOTE — Anesthesia Postprocedure Evaluation (Signed)
Anesthesia Post Note  Patient: Autumn Fitzgerald  Procedure(s) Performed: Procedure(s) (LRB): ARTHROSCOPY RIGHT KNEE (Right)  Anesthesia type: general  Patient location: PACU  Post pain: Pain level controlled  Post assessment: Patient's Cardiovascular Status Stable  Last Vitals:  Filed Vitals:   10/17/13 1645  BP: 105/65  Pulse: 79  Temp:   Resp: 18    Post vital signs: Reviewed and stable  Level of consciousness: sedated  Complications: No apparent anesthesia complications

## 2013-10-17 NOTE — H&P (Signed)
Subjective:   Patient is a 67 y.o. female presents with right knee pain. Onset of symptoms was several months ago with a worsening course since that time. The pain is located medial right knee. Patient describes the pain as sharp. Patient has failed conservative management.  Patient Active Problem List   Diagnosis Date Noted  . 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 LBBB (left bundle branch block) 07/23/2013  . Urinary tract infection, site not specified 07/09/2013  . Chest pain with low risk for cardiac etiology 07/04/2013  . Panic disorder 09/28/2012  . Lower abdominal pain 01/10/2012  . Panic attacks   . Hypertrophic cardiomyopathy   . GERD (gastroesophageal reflux disease)   . HLD (hyperlipidemia)   . Asthma   . HTN (hypertension)   . Palpitations    Past Medical History  Diagnosis Date  . Panic attacks   . 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 GERD (gastroesophageal reflux disease)   . HLD (hyperlipidemia)   . Asthma   . Palpitations   . Near syncope   . Chest wall pain   . Hypertension     at times,  . Mental disorder   . Depression   . Head injury, closed, without LOC 02/2013    did not LOC per patient  . Fibromyalgia     pt denies.  "Never told she has fibromyalagia"  . Arthritis     Past Surgical History  Procedure Laterality Date  . 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 Tubal ligation    . Cholecystectomy    . Loop recorder implant  03/18/2013  . Septal myectomy    . Nuclear stress test  11/30/2011    No ischemia  . Doppler echocardiography  03/14/2013    EF 65-70%,trivial AI,mild-mod. MR,Mod. TR,mod. concentric hypertrophy    Prescriptions prior to admission  Medication Sig Dispense Refill  . acetaminophen (TYLENOL) 500 MG tablet Take 500 mg by mouth every 6 (six) hours as needed for pain.      Marland Kitchen atenolol (TENORMIN) 25 MG tablet Take 12.5-25 mg by mouth daily as needed (for high blood pressure as needed.).       Marland Kitchen  B Complex-C (SUPER B COMPLEX PO) Take 1 tablet by mouth daily.      . Cholecalciferol (VITAMIN D-3) 5000 UNITS TABS Take 5,000 Units by mouth daily.       . clonazePAM (KLONOPIN) 1 MG tablet Take 1-2 mg by mouth See admin instructions. Take 1 mg daily if needed and take 2 mg at bedtime scheduled      . ezetimibe-simvastatin (VYTORIN) 10-40 MG per tablet Take 0.5 tablets by mouth at bedtime.       Marland Kitchen FLUoxetine (PROZAC) 20 MG capsule Take 20 mg by mouth daily.      . Misc Natural Products (OSTEO BI-FLEX ADV DOUBLE ST) TABS Take 1 tablet by mouth 2 (two) times daily.      . Multiple Vitamin (MULTIVITAMIN) tablet Take 1 tablet by mouth daily.      . pantoprazole (PROTONIX) 40 MG tablet Take 40 mg by mouth daily.      Bertram Gala Glycol-Propyl Glycol (SYSTANE) 0.4-0.3 % SOLN Apply 1 drop to eye 2 (two) times daily.       . mometasone (ASMANEX) 220 MCG/INH inhaler Inhale 1 puff into the lungs daily as needed (for shortness of breath).        No Known Allergies  History  Substance Use Topics  . Smoking status: Never Smoker   .  Smokeless tobacco: Not on file  . Alcohol Use: No    Family History  Problem Relation Age of Onset  . Cancer Mother     breast  . Hypertension Mother   . Cancer Father     prostate  . Diabetes Sister   . Heart attack Sister     Review of Systems A comprehensive review of systems was negative.  Objective:   Patient Vitals for the past 8 hrs:  BP Temp Temp src Pulse Resp SpO2 Height Weight  10/17/13 1211 127/76 mmHg 97.6 F (36.4 C) Oral 87 18 100 % 5' (1.524 m) 58.06 kg (128 lb)       Physical Exam: Healthy appearing female in no apparent distress.  The patient has normal right knee ROM with pain at end range ROM. Tender over the medial joint line.  Good quad.  No masses in the popliteal space. Pain free ankle pumps.  NVI distally right LE      Assessment:   Medial Meniscus tear right knee  Plan:   Right knee arthroscopy with partial medial  meniscectomy.

## 2013-10-17 NOTE — Discharge Instructions (Signed)
Ice to the right knee constantly.  Elevate the right leg as much as possible.  Keep bandage in place from surgery.  On Sunday, unwrap the ACE wraps and gauze from the right knee and place BandAids and then apply the stocking to the knee.   Use crutches to keep 1/2 your weight off the right leg for one week, then progress to full weight bearing.  Do exercises every hour, calf pumps,  Heel slides,  Thigh tightening exercises, knee push down.  Follow up in two weeks with Dr Ranell Patrick [phone]

## 2013-10-17 NOTE — Anesthesia Preprocedure Evaluation (Signed)
Anesthesia Evaluation  Patient identified by MRN, date of birth, ID band Patient awake    Reviewed: Allergy & Precautions, H&P , NPO status , Patient's Chart, lab work & pertinent test results, reviewed documented beta blocker date and time   Airway Mallampati: II TM Distance: >3 FB Neck ROM: full    Dental   Pulmonary asthma ,  breath sounds clear to auscultation        Cardiovascular hypertension, + CABG + dysrhythmias Rhythm:regular     Neuro/Psych PSYCHIATRIC DISORDERS  Neuromuscular disease negative neurological ROS     GI/Hepatic Neg liver ROS, GERD-  Medicated and Controlled,  Endo/Other  negative endocrine ROS  Renal/GU negative Renal ROS  negative genitourinary   Musculoskeletal   Abdominal   Peds  Hematology negative hematology ROS (+)   Anesthesia Other Findings See surgeon's H&P   Reproductive/Obstetrics negative OB ROS                           Anesthesia Physical Anesthesia Plan  ASA: III  Anesthesia Plan: General   Post-op Pain Management:    Induction: Intravenous  Airway Management Planned: LMA  Additional Equipment:   Intra-op Plan:   Post-operative Plan:   Informed Consent: I have reviewed the patients History and Physical, chart, labs and discussed the procedure including the risks, benefits and alternatives for the proposed anesthesia with the patient or authorized representative who has indicated his/her understanding and acceptance.   Dental Advisory Given  Plan Discussed with: CRNA and Surgeon  Anesthesia Plan Comments:         Anesthesia Quick Evaluation

## 2013-10-17 NOTE — Anesthesia Procedure Notes (Signed)
Procedure Name: LMA Insertion Date/Time: 10/17/2013 3:05 PM Performed by: Sharlene Dory E Pre-anesthesia Checklist: Patient identified, Emergency Drugs available, Suction available, Patient being monitored and Timeout performed Patient Re-evaluated:Patient Re-evaluated prior to inductionOxygen Delivery Method: Circle system utilized Preoxygenation: Pre-oxygenation with 100% oxygen Intubation Type: IV induction LMA: LMA inserted LMA Size: 4.0 Number of attempts: 1 Placement Confirmation: positive ETCO2 and breath sounds checked- equal and bilateral Tube secured with: Tape Dental Injury: Teeth and Oropharynx as per pre-operative assessment

## 2013-10-17 NOTE — Preoperative (Signed)
Beta Blockers   Reason not to administer Beta Blockers:Hold beta blocker due to hypotension 

## 2013-10-17 NOTE — Transfer of Care (Signed)
Immediate Anesthesia Transfer of Care Note  Patient: Autumn Fitzgerald  Procedure(s) Performed: Procedure(s): ARTHROSCOPY RIGHT KNEE (Right)  Patient Location: PACU  Anesthesia Type:General  Level of Consciousness: awake, alert  and oriented  Airway & Oxygen Therapy: Patient Spontanous Breathing and Patient connected to nasal cannula oxygen  Post-op Assessment: Report given to PACU RN, Post -op Vital signs reviewed and stable and Patient moving all extremities  Post vital signs: Reviewed and stable  Complications: No apparent anesthesia complications

## 2013-10-17 NOTE — Brief Op Note (Signed)
10/17/2013  4:41 PM  PATIENT:  Autumn Fitzgerald  67 y.o. female  PRE-OPERATIVE DIAGNOSIS:  right knee medial meniscal tear  POST-OPERATIVE DIAGNOSIS:  right knee medial meniscal tear, lateral meniscus tear, chondromalacia (3compartments)  PROCEDURE:  Procedure(s): ARTHROSCOPY RIGHT KNEE (Right)  SURGEON:  Surgeon(s) and Role:    * Verlee Rossetti, MD - Primary  PHYSICIAN ASSISTANT:   ASSISTANTS: none   ANESTHESIA:   general  EBL:  Total I/O In: 700 [I.V.:700] Out: -   BLOOD ADMINISTERED:none  DRAINS: none   LOCAL MEDICATIONS USED:  MARCAINE     SPECIMEN:  No Specimen  DISPOSITION OF SPECIMEN:  N/A  COUNTS:  YES  TOURNIQUET:  * No tourniquets in log *  DICTATION: .Other Dictation: Dictation Number [id number]  PLAN OF CARE: Discharge to home after PACU  PATIENT DISPOSITION:  PACU - hemodynamically stable.   Delay start of Pharmacological VTE agent (>24hrs) due to surgical blood loss or risk of bleeding: not applicable

## 2013-10-18 NOTE — Op Note (Signed)
NAMEKHRYSTYNA, Autumn Fitzgerald NO.:  [id number]  MEDICAL RECORD NO.:  [id number]  LOCATION:  [facility]                         FACILITY:  [facility]  PHYSICIAN:  Almedia Balls. Ranell Patrick, M.D. DATE OF BIRTH:  1946/04/13  DATE OF PROCEDURE:  10/17/2013 DATE OF DISCHARGE:  10/17/2013                              OPERATIVE REPORT   PREOPERATIVE DIAGNOSIS:  Right knee medial meniscus tear.  POSTOPERATIVE DIAGNOSIS: 1. Right knee medial meniscus tear. 2. Right knee lateral meniscus tear. 3. 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  PROCEDURE PERFORMED:  Right knee arthroscopy with partial medial and lateral meniscectomies, chondroplasty in all 3 compartments, and 2 compartments to bleeding bone.  ANESTHESIA:  General anesthesia was used.  ESTIMATED BLOOD LOSS:  Minimal.  FLUID REPLACEMENT:  1200 mL crystalloid.  INSTRUMENT COUNTS:  Correct.  COMPLICATIONS:  There were no complications.  ANTIBIOTICS:  Perioperative antibiotics were given.  INDICATIONS:  Patient is a 67 year old female with worsening right knee pain secondary to torn medial meniscus.  The patient's pain is mostly in the medial compartment.  The patient has had refractory symptoms despite conservative management and presents for operative treatment to restore function, eliminate pain to her knee, and informed consent obtained.  DESCRIPTION OF PROCEDURE:  After an adequate level of anesthesia was achieved, the patient was positioned in the supine position.  Right knee correctly identified and sterilely prepped and draped in the usual manner.  After time-out was called, we entered the right knee using standard portals including superolateral outflow anterolateral scope and anteromedial working portals.  We identified significant synovitis inside the knee.  There was chondromalacia on the medial patella facet, performed a chondroplasty removing all loose flaps and fibrillation of cartilage.  After we completed the  chondroplasty in the patellofemoral joint, we entered the medial compartment.  There was a complex post torn medial meniscus tear with large meniscal flap that was flipped up into the gutter.  We used basket forceps and then a pituitary rongeur, removed that large piece of meniscus.  We then used basket forceps and motorized shaver, removed the remaining unstable meniscal tissue.  About 50% of the posterior horn of medial meniscus had to be removed. Remaining meniscal tissue was preserved.  Hoop stresses were felt to be preserved as the meniscal roots were intact and the tear pattern was primarily longitudinal.  We then did a chondroplasty in medial compartment.  There were multiple areas of deep wear including some areas to bone.  We did a chondroplasty including to bleeding bone, and several small areas on the medial weightbearing portion medial femoral condyle.  ACL and PCL intact.  Lateral compartment entered.  A free edge of the lateral meniscus tear was identified, which was treated with partial meniscectomy using basket forceps, motorized shaver, about 10- 15% meniscus had to be removed.  Remaining meniscus was probed and felt to be stable.  There was some articular cartilage defect on the weightbearing surface of the lateral femoral condyle and a small area 1 x 1 cm.  We performed a chondroplasty, removing loose flaps and fibrillation, did a little gentle chondroplasty to the bone there and did a final inspection of the knee to make  sure there is no other loose debris noted.  We thoroughly irrigated the knee and then closed the knee with interrupted 4-0 Monocryl suture with buried knot.  Steri-Strips and sterile compressive bandage were applied.  The patient tolerated the procedure well.     Almedia Balls. Ranell Patrick, M.D.     SRN/MEDQ  D:  10/17/2013  T:  10/18/2013  Job:  [id number]

## 2013-10-20 ENCOUNTER — Encounter (HOSPITAL_COMMUNITY): Payer: Self-pay | Admitting: Orthopedic Surgery

## 2013-11-03 ENCOUNTER — Encounter (HOSPITAL_COMMUNITY): Payer: Self-pay | Admitting: *Deleted

## 2013-11-05 ENCOUNTER — Telehealth: Payer: Self-pay | Admitting: *Deleted

## 2013-11-05 NOTE — Telephone Encounter (Signed)
I informed patient that the [employer] web site shows that her home transmitter for her Lin[employer] is not connecting. I asked the patient to check and make sure that everything was set up. Patient voiced understanding and stated that she would.

## 2013-11-10 ENCOUNTER — Encounter (HOSPITAL_COMMUNITY): Payer: Self-pay | Admitting: Pharmacy Technician

## 2013-11-21 ENCOUNTER — Other Ambulatory Visit: Payer: Self-pay | Admitting: Gastroenterology

## 2013-11-24 ENCOUNTER — Encounter (HOSPITAL_COMMUNITY): Payer: Medicare Other | Admitting: Anesthesiology

## 2013-11-24 ENCOUNTER — Encounter (HOSPITAL_COMMUNITY)
Admission: RE | Disposition: A | Discharge status: Home / Self Care | Payer: Self-pay | Source: Ambulatory Visit | Attending: Gastroenterology

## 2013-11-24 ENCOUNTER — Ambulatory Visit (HOSPITAL_COMMUNITY): Payer: Medicare Other | Admitting: Anesthesiology

## 2013-11-24 ENCOUNTER — Ambulatory Visit (HOSPITAL_COMMUNITY)
Admission: RE | Admit: 2013-11-24 | Discharge: 2013-11-24 | Disposition: A | Discharge status: Home / Self Care | Payer: Medicare Other | Source: Ambulatory Visit | Attending: Gastroenterology | Admitting: Gastroenterology

## 2013-11-24 ENCOUNTER — Encounter (HOSPITAL_COMMUNITY): Payer: Self-pay | Admitting: Anesthesiology

## 2013-11-24 DIAGNOSIS — K449 Diaphragmatic hernia without obstruction or gangrene: Secondary | ICD-10-CM | POA: Diagnosis not present

## 2013-11-24 DIAGNOSIS — D131 Benign neoplasm of stomach: Secondary | ICD-10-CM | POA: Insufficient documentation

## 2013-11-24 DIAGNOSIS — Z79899 Other long term (current) drug therapy: Secondary | ICD-10-CM | POA: Insufficient documentation

## 2013-11-24 DIAGNOSIS — R1013 Epigastric pain: Secondary | ICD-10-CM | POA: Insufficient documentation

## 2013-11-24 DIAGNOSIS — I2510 Atherosclerotic heart disease of native coronary artery without angina pectoris: Secondary | ICD-10-CM | POA: Insufficient documentation

## 2013-11-24 DIAGNOSIS — E785 Hyperlipidemia, unspecified: Secondary | ICD-10-CM | POA: Insufficient documentation

## 2013-11-24 DIAGNOSIS — J45909 Unspecified asthma, uncomplicated: Secondary | ICD-10-CM | POA: Insufficient documentation

## 2013-11-24 DIAGNOSIS — K625 Hemorrhage of anus and rectum: Secondary | ICD-10-CM | POA: Insufficient documentation

## 2013-11-24 DIAGNOSIS — Z951 Presence of aortocoronary bypass graft: Secondary | ICD-10-CM | POA: Insufficient documentation

## 2013-11-24 DIAGNOSIS — K219 Gastro-esophageal reflux disease without esophagitis: Secondary | ICD-10-CM | POA: Insufficient documentation

## 2013-11-24 DIAGNOSIS — I10 Essential (primary) hypertension: Secondary | ICD-10-CM | POA: Insufficient documentation

## 2013-11-24 HISTORY — PX: COLONOSCOPY: SHX5424

## 2013-11-24 HISTORY — PX: ESOPHAGOGASTRODUODENOSCOPY: SHX5428

## 2013-11-24 SURGERY — EGD (ESOPHAGOGASTRODUODENOSCOPY)
Anesthesia: Monitor Anesthesia Care

## 2013-11-24 MED ORDER — MIDAZOLAM HCL 5 MG/5ML IJ SOLN
INTRAMUSCULAR | Status: DC | PRN
  Administered 2013-11-24: 2 mg via INTRAVENOUS

## 2013-11-24 MED ORDER — PROPOFOL 10 MG/ML IV BOLUS
INTRAVENOUS | Status: AC
  Filled 2013-11-24: qty 20

## 2013-11-24 MED ORDER — SODIUM CHLORIDE 0.9 % IV SOLN: INTRAVENOUS | Status: DC

## 2013-11-24 MED ORDER — PROPOFOL INFUSION 10 MG/ML OPTIME
INTRAVENOUS | Status: DC | PRN
  Administered 2013-11-24: 120 ug/kg/min via INTRAVENOUS

## 2013-11-24 MED ORDER — PROPOFOL 10 MG/ML IV BOLUS
INTRAVENOUS | Status: DC | PRN
  Administered 2013-11-24: 50 mg via INTRAVENOUS

## 2013-11-24 MED ORDER — LACTATED RINGERS IV SOLN
INTRAVENOUS | Status: DC
  Administered 2013-11-24: 12:00:00 via INTRAVENOUS

## 2013-11-24 MED ORDER — MIDAZOLAM HCL 2 MG/2ML IJ SOLN
INTRAMUSCULAR | Status: AC
  Filled 2013-11-24: qty 2

## 2013-11-24 NOTE — Op Note (Addendum)
[facility] [address] [place], [address]   OPERATIVE PROCEDURE REPORT  PATIENT: Autumn Fitzgerald, Autumn Fitzgerald  MR#: [id number] BIRTHDATE: 10/07/1946 GENDER: Female ENDOSCOPIST: Dr.  Lorenza Burton, MD ASSISTANT:   Kandice Robinsons, technician Claudie Revering, RN CGRN PROCEDURE DATE: 11/24/2013 PRE-PROCEDURE PREPARATION: The patient was prepped with a gallon of Golytely the night prior to the procedure.  The patient was fasted for 8 hours prior to the procedure.  PRE-PROCEDURE PHYSICAL: Patient has stable vital signs.  Neck is supple.  There is no JVD, thyromegaly or LAD.  Chest clear to auscultation.  S1 and S2 regular.  Abdomen soft, non-distended, non-tender with NABS. PROCEDURE:     Screening colonoscopy. ASA CLASS:     Class III INDICATIONS:     1.  Rectal bleeding. 2.  Colorectal cancer screening. MEDICATIONS:     MAC sedation, administered by CRNA  DESCRIPTION OF PROCEDURE:   After the risks, benefits, and alternatives of the procedure were thoroughly explained [including a 10% missed rate of cancer and polyps], informed consent was obtained.  Digital rectal exam was performed.  The Pentax Colonoscope [id number]  was introduced through the anus  and advanced to the cecum, which was identified by both the appendix and ileocecal valve , limited by No adverse events experienced.   The quality of the prep was fair. . Multiple washes were done. Small lesions could be missed. The instrument was then slowly withdrawn as the colon was fully examined.     COLON FINDINGS: A normal appearing cecum, ileocecal valve, and appendiceal orifice were identified.  The ascending, hepatic flexure, transverse, splenic flexure, descending, sigmoid colon and rectum appeared unremarkable.  No polyps or cancers were seen. Moderate sized internal hemorrhoids were noted on retroflexion. The entire colonic mucosa appeared healthy with a normal vascular pattern.  No masses, polyps,  diverticula or AVMs were noted.  The appendiceal orifice and the ICV were identified and photographed. The patient tolerated the procedure without immediate complications. The scope was then withdrawn from the patient and the procedure terminated.  TIME TO CECUM:   03 minutes 0 seconds WITHDRAW TIME:  06 minutes 0 seconds  IMPRESSION:     1.  Normal appearing colon but complete visualization of the mucosa was somewhat klimited due to a llot of stool in the colon. 2.  Moderate sized internal hemorrhoids   RECOMMENDATIONS:     1.  Continue surveillance 2.  High fiber diet with liberal fluid intake. 3.  OP follow-up is advised on a PRN basis.   REPEAT EXAM:      In 10 years.  If the patient has any abnormal GI symptoms in the interim, she have been advised to contact the office as soon as possible for further recommendations.   CPT CODES:     [id number], Screening Colonoscopy   DIAGNOSIS CODES:     569.3 Rectal Bleeding V76.51 Colorectal cancer screening   REFERRED [id number] Mardelle Matte, M.D.  eSigned:  Dr. Lorenza Burton, MD 11/24/2013 2:03 PM Revised: 11/24/2013 2:03 PM  PATIENT NAME:  [name] MR#: [id number]

## 2013-11-24 NOTE — Op Note (Signed)
[facility] [address] [place], [address]   OPERATIVE PROCEDURE REPORT  PATIENT :Autumn Fitzgerald, Autumn Fitzgerald  MR#: [id number] BIRTHDATE :09/18/1946 GENDER: Female ENDOSCOPIST:  Lorenza Burton, MD ASSISTANT:   Tomma Rakers, RN, BSN Kandice Robinsons, technician PROCEDURE DATE: 18-Dec-2013 PRE-PROCEDURE PREPERATION: Patient fasted for 4 hours prior to procedure. PRE-PROCEDURE PHYSICAL: Patient has stable vital signs.  Neck is supple.  There is no JVD, thyromegaly or LAD.  Chest clear to auscultation.  S1 and S2 regular.  Abdomen soft, non-distended, non-tender with NABS. PROCEDURE:     EGD with hot snare polypectomy x 15. ASA CLASS:     Class III INDICATIONS:     1) Rectal bleeding 2) GERD 3) Epigastric pain.  MEDICATIONS:     See Anesthesia Report. TOPICAL ANESTHETIC:   None given  DESCRIPTION OF PROCEDURE:   After the risks benefits and alternatives of the procedure were thoroughly explained, informed consent was obtained.  The Pentax Gastroscope [id number]  was introduced through the mouth and advanced to the second portion of the duodenum , without limitations. The instrument was slowly withdrawn as the mucosa was fully examined.   The esophagus, and the proximal small bowel appeared normal. There were no ulcers, erosions or masses noted. Retroflexed views revealed a small hiatal hernia. Multiple sessile polyps were seem in the proximal stomach and 15 of the largest ones were removed with a hot snare x 15-200/20 but only 5 were recovered with a Roth net. The scope was then withdrawn from the patient and the procedure terminated. The patient tolerated the procedure without immediate complications.  IMPRESSION:  Small hiatl hernia and multiple gastric polyps-removed by hot snare [see description above]; otherwise, Normal esophagogastroduodenoscopy.  RECOMMENDATIONS:     1.  Anti-reflux regimen to be followed. 2.  Await pathology results 3.  Avoid NSAIDS for  two weeks. 4.  Continue PPI's 5.  OP follow-up is advised on a PRN basis.   REPEAT EXAM:  No recall planned for now.  DISCHARGE INSTRUCTIONS: standard discharge instructions given. _______________________________ eSigned:  Dr. Lorenza Burton, MD 12/18/13 1:53 PM   CPT CODES:     43251, EGD with Removal Polyps (snare)  DIAGNOSIS CODES:     569.3, 530.81, 789.06   CC: Asencion Partridge, M.D.  PATIENT NAME:  Autumn Fitzgerald, Autumn Fitzgerald MR#: [id number]

## 2013-11-24 NOTE — Anesthesia Preprocedure Evaluation (Addendum)
Anesthesia Evaluation  Patient identified by MRN, date of birth, ID band Patient awake    Reviewed: Allergy & Precautions, H&P , NPO status , Patient's Chart, lab work & pertinent test results  Airway Mallampati: II TM Distance: >3 FB Neck ROM: Full    Dental no notable dental hx.    Pulmonary asthma ,  breath sounds clear to auscultation  Pulmonary exam normal       Cardiovascular hypertension, Pt. on medications and Pt. on home beta blockers + CAD + dysrhythmias Rhythm:Regular Rate:Normal  Hypertrophic obstructive cardiomyopathy.  H/O LBBB  Saw cardiologist in August.   Neuro/Psych PSYCHIATRIC DISORDERS Anxiety Depression Near syncope.  Neuromuscular disease    GI/Hepatic Neg liver ROS, GERD-  Medicated,  Endo/Other  negative endocrine ROS  Renal/GU negative Renal ROS  negative genitourinary   Musculoskeletal  (+) Fibromyalgia -  Abdominal   Peds negative pediatric ROS (+)  Hematology negative hematology ROS (+)   Anesthesia Other Findings   Reproductive/Obstetrics negative OB ROS                         Anesthesia Physical Anesthesia Plan  ASA: IV  Anesthesia Plan: MAC   Post-op Pain Management:    Induction: Intravenous  Airway Management Planned:   Additional Equipment:   Intra-op Plan:   Post-operative Plan: Extubation in OR  Informed Consent: I have reviewed the patients History and Physical, chart, labs and discussed the procedure including the risks, benefits and alternatives for the proposed anesthesia with the patient or authorized representative who has indicated his/her understanding and acceptance.   Dental advisory given  Plan Discussed with: CRNA  Anesthesia Plan Comments:        Anesthesia Quick Evaluation

## 2013-11-24 NOTE — H&P (Signed)
Autumn Fitzgerald is an 67 y.o. female.   Chief Complaint: Rectal bleeding/acid reflux. HPI: 67 year old Hispanic female here for colorectal cancer screening. She also has problems with reflux. She denies any dysphagia or odynophagia.   Past Medical History  Diagnosis Date  . Panic attacks   . 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 GERD (gastroesophageal reflux disease)   . HLD (hyperlipidemia)   . Asthma   . Palpitations   . Near syncope   . Chest wall pain   . Hypertension     at times,  . Mental disorder   . Depression   . Head injury, closed, without LOC 02/2013    did not LOC per patient  . Fibromyalgia     pt denies.  "Never told she has fibromyalagia"  . Arthritis    Past Surgical History  Procedure Laterality Date  . 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 Tubal ligation    . Cholecystectomy    . Loop recorder implant  03/18/2013  . Septal myectomy    . Nuclear stress test  11/30/2011    No ischemia  . Doppler echocardiography  03/14/2013    EF 65-70%,trivial AI,mild-mod. MR,Mod. TR,mod. concentric hypertrophy  . Knee arthroscopy Right 10/17/2013    Procedure: ARTHROSCOPY RIGHT KNEE;  Surgeon: Verlee Rossetti, MD;  Location: [facility] OR;  Service: Orthopedics;  Laterality: Right;   Family History  Problem Relation Age of Onset  . Cancer Mother     breast  . Hypertension Mother   . Cancer Father     prostate  . Diabetes Sister   . Heart attack Sister    Social History:  reports that she has never smoked. She does not have any smokeless tobacco history on file. She reports that she does not drink alcohol or use illicit drugs.  Allergies: No Known Allergies  Medications Prior to Admission  Medication Sig Dispense Refill  . B Complex-C (SUPER B COMPLEX PO) Take 1 tablet by mouth daily.      . clonazePAM (KLONOPIN) 1 MG tablet Take 1-2 mg by mouth See admin instructions. Take 1 mg daily if needed and take 2 mg at bedtime scheduled      . FLUoxetine  (PROZAC) 20 MG capsule Take 20 mg by mouth every morning.       . pantoprazole (PROTONIX) 40 MG tablet Take 40 mg by mouth daily.      Bertram Gala Glycol-Propyl Glycol (SYSTANE) 0.4-0.3 % SOLN Apply 1 drop to eye 2 (two) times daily.       Marland Kitchen acetaminophen (TYLENOL) 500 MG tablet Take 500 mg by mouth every 6 (six) hours as needed for mild pain.       Marland Kitchen albuterol (PROVENTIL HFA;VENTOLIN HFA) 108 (90 BASE) MCG/ACT inhaler Inhale 2 puffs into the lungs every 6 (six) hours as needed for wheezing or shortness of breath.      Marland Kitchen atenolol (TENORMIN) 25 MG tablet Take 12.5-25 mg by mouth daily as needed (for high blood pressure as needed.).       Marland Kitchen Cholecalciferol (VITAMIN D-3) 5000 UNITS TABS Take 5,000 Units by mouth daily.       Marland Kitchen ezetimibe-simvastatin (VYTORIN) 10-40 MG per tablet Take 0.5 tablets by mouth at bedtime.       . Misc Natural Products (OSTEO BI-FLEX ADV DOUBLE ST) TABS Take 2 tablets by mouth 2 (two) times daily.       . mometasone (ASMANEX) 220 MCG/INH inhaler Inhale 1 puff  into the lungs daily as needed (for shortness of breath).       . Multiple Vitamin (MULTIVITAMIN) tablet Take 1 tablet by mouth daily.        No results found for this or any previous visit (from the past 48 hour(s)). No results found.  Review of Systems  HENT: Negative.   Eyes: Negative.   Respiratory: Negative.   Cardiovascular: Negative.   Gastrointestinal: Positive for heartburn and constipation. Negative for nausea, vomiting, abdominal pain, diarrhea, blood in stool and melena.  Genitourinary: Negative.   Musculoskeletal: Positive for joint pain.  Skin: Negative.   Neurological: Negative.   Endo/Heme/Allergies: Negative.   Psychiatric/Behavioral: The patient is nervous/anxious.    Blood pressure 144/101, pulse 99, temperature 98.2 F (36.8 C), temperature source Oral, resp. rate 22, height 5' (1.524 m), weight 58.06 kg (128 lb), SpO2 98.00%. Physical Exam  Constitutional: She is oriented to person,  place, and time. She appears well-developed and well-nourished.  HENT:  Head: Normocephalic and atraumatic.  Eyes: Conjunctivae and EOM are normal. Pupils are equal, round, and reactive to light.  Neck: Normal range of motion. Neck supple.  Cardiovascular: Normal rate and regular rhythm.   Respiratory: [place] normal and breath sounds normal.  GI: Soft. Bowel sounds are normal.  Musculoskeletal: Normal range of motion.  Neurological: She is alert and oriented to person, place, and time.  Skin: Skin is warm and dry.  Psychiatric: She has a normal mood and affect. Her behavior is normal. Judgment and thought content normal.    Assessment/Plan 1) Rectal bleeding/acid reflux: proceed with an EGD/Colonoscopy at time.   Atianna Haidar 11/24/2013, 12:59 PM

## 2013-11-24 NOTE — Discharge Instructions (Signed)
Colonoscopy Care After Read the instructions outlined below and refer to this sheet in the next few weeks. These discharge instructions provide you with general information on caring for yourself after you leave the hospital. Your doctor may also give you specific instructions. While your treatment has been planned according to the most current medical practices available, unavoidable complications occasionally occur. If you have any problems or questions after discharge, call your doctor. HOME CARE INSTRUCTIONS ACTIVITY:  You may resume your regular activity, but move at a slower pace for the next 24 hours.  Take frequent rest periods for the next 24 hours.  Walking will help get rid of the air and reduce the bloated feeling in your belly (abdomen).  No driving for 24 hours (because of the medicine (anesthesia) used during the test).  You may shower.  Do not sign any important legal documents or operate any machinery for 24 hours (because of the anesthesia used during the test). NUTRITION:  Drink plenty of fluids.  You may resume your normal diet as instructed by your doctor.  Begin with a light meal and progress to your normal diet. Heavy or fried foods are harder to digest and may make you feel sick to your stomach (nauseated).  Avoid alcoholic beverages for 24 hours or as instructed. MEDICATIONS:  You may resume your normal medications unless your doctor tells you otherwise. WHAT TO EXPECT TODAY:  Some feelings of bloating in the abdomen.  Passage of more gas than usual.  Spotting of blood in your stool or on the toilet paper. IF YOU HAD POLYPS REMOVED DURING THE COLONOSCOPY:  No aspirin products for 7 days or as instructed.  No alcohol for 7 days or as instructed.  Eat a soft diet for the next 24 hours. FINDING OUT THE RESULTS OF YOUR TEST Not all test results are available during your visit. If your test results are not back during the visit, make an appointment with  your caregiver to find out the results. Do not assume everything is normal if you have not heard from your caregiver or the medical facility. It is important for you to follow up on all of your test results.  SEEK IMMEDIATE MEDICAL CARE IF:  You have more than a spotting of blood in your stool.  Your belly is swollen (abdominal distention).  You are nauseated or vomiting.  You have a fever.  You have abdominal pain or discomfort that is severe or gets worse throughout the day. Document Released: 07/11/2004 Document Revised: 02/19/2012 Document Reviewed: 07/09/2008 [facility] Patient Information 2014 [place], [place]. Gastrointestinal Endoscopy Care After Refer to this sheet in the next few weeks. These instructions provide you with information on caring for yourself after your procedure. Your caregiver may also give you more specific instructions. Your treatment has been planned according to current medical practices, but problems sometimes occur. Call your caregiver if you have any problems or questions after your procedure. HOME CARE INSTRUCTIONS  If you were given medicine to help you relax (sedative), do not drive, operate machinery, or sign important documents for 24 hours.  Avoid alcohol and hot or warm beverages for the first 24 hours after the procedure.  Only take over-the-counter or prescription medicines for pain, discomfort, or fever as directed by your caregiver. You may resume taking your normal medicines unless your caregiver tells you otherwise. Ask your caregiver when you may resume taking medicines that may cause bleeding, such as aspirin, clopidogrel, or warfarin.  You may return to  your normal diet and activities on the day after your procedure, or as directed by your caregiver. Walking may help to reduce any bloated feeling in your abdomen.  Drink enough fluids to keep your urine clear or pale yellow.  You may gargle with salt water if you have a sore throat. SEEK  IMMEDIATE MEDICAL CARE IF:  You have severe nausea or vomiting.  You have severe abdominal pain, abdominal cramps that last longer than 6 hours, or abdominal swelling (distention).  You have severe shoulder or back pain.  You have trouble swallowing.  You have shortness of breath, your breathing is shallow, or you are breathing faster than normal.  You have a fever or a rapid heartbeat.  You vomit blood or material that looks like coffee grounds.  You have bloody, black, or tarry stools. MAKE SURE YOU:  Understand these instructions.  Will watch your condition.  Will get help right away if you are not doing well or get worse. Document Released: 07/11/2004 Document Revised: 05/28/2012 Document Reviewed: 02/27/2012 [facility] Patient Information 2014 [place], [place].

## 2013-11-24 NOTE — Transfer of Care (Signed)
Immediate Anesthesia Transfer of Care Note  Patient: Autumn Fitzgerald  Procedure(s) Performed: Procedure(s): ESOPHAGOGASTRODUODENOSCOPY (EGD) (N/A) COLONOSCOPY (N/A)  Patient Location: PACU and Endoscopy Unit  Anesthesia Type:MAC  Level of Consciousness: awake and alert   Airway & Oxygen Therapy: Patient Spontanous Breathing and Patient connected to face mask oxygen  Post-op Assessment: Report given to PACU RN and Post -op Vital signs reviewed and stable  Post vital signs: Reviewed and stable  Complications: No apparent anesthesia complications

## 2013-11-24 NOTE — Anesthesia Postprocedure Evaluation (Signed)
  Anesthesia Post-op Note  Patient: Autumn Fitzgerald  Procedure(s) Performed: Procedure(s) (LRB): ESOPHAGOGASTRODUODENOSCOPY (EGD) (N/A) COLONOSCOPY (N/A)  Patient Location: PACU  Anesthesia Type: MAC  Level of Consciousness: awake and alert   Airway and Oxygen Therapy: Patient Spontanous Breathing  Post-op Pain: mild  Post-op Assessment: Post-op Vital signs reviewed, Patient's Cardiovascular Status Stable, Respiratory Function Stable, Patent Airway and No signs of Nausea or vomiting  Last Vitals:  Filed Vitals:   11/24/13 1445  BP: 127/77  Pulse:   Temp:   Resp: 13    Post-op Vital Signs: stable   Complications: No apparent anesthesia complications

## 2013-11-25 ENCOUNTER — Encounter (HOSPITAL_COMMUNITY): Payer: Self-pay | Admitting: Gastroenterology

## 2013-12-02 ENCOUNTER — Encounter: Payer: Self-pay | Admitting: *Deleted

## 2013-12-02 ENCOUNTER — Ambulatory Visit (INDEPENDENT_AMBULATORY_CARE_PROVIDER_SITE_OTHER): Payer: Medicaid Other

## 2013-12-02 DIAGNOSIS — I421 Obstructive hypertrophic cardiomyopathy: Secondary | ICD-10-CM

## 2013-12-02 DIAGNOSIS — R002 Palpitations: Secondary | ICD-10-CM

## 2013-12-02 LAB — PACEMAKER DEVICE OBSERVATION

## 2013-12-02 LAB — MDC_IDC_ENUM_SESS_TYPE_REMOTE

## 2013-12-16 LAB — PACEMAKER DEVICE OBSERVATION

## 2013-12-23 ENCOUNTER — Ambulatory Visit: Payer: Medicare Other | Admitting: Cardiovascular Disease

## 2013-12-24 ENCOUNTER — Encounter: Payer: Self-pay | Admitting: *Deleted

## 2013-12-25 LAB — PACEMAKER DEVICE OBSERVATION

## 2014-01-02 ENCOUNTER — Other Ambulatory Visit: Payer: Self-pay | Admitting: Cardiovascular Disease

## 2014-01-02 ENCOUNTER — Ambulatory Visit (INDEPENDENT_AMBULATORY_CARE_PROVIDER_SITE_OTHER): Payer: Medicare Other | Admitting: *Deleted

## 2014-01-02 DIAGNOSIS — R002 Palpitations: Secondary | ICD-10-CM

## 2014-01-02 LAB — PACEMAKER DEVICE OBSERVATION

## 2014-01-05 ENCOUNTER — Encounter: Payer: Self-pay | Admitting: *Deleted

## 2014-01-13 ENCOUNTER — Ambulatory Visit: Payer: Medicare Other | Admitting: Neurology

## 2014-01-21 ENCOUNTER — Ambulatory Visit (INDEPENDENT_AMBULATORY_CARE_PROVIDER_SITE_OTHER): Payer: Medicare Other | Admitting: Cardiovascular Disease

## 2014-01-21 VITALS — BP 122/76 | HR 86 | Resp 16 | Ht 60.0 in | Wt 128.6 lb

## 2014-01-21 DIAGNOSIS — R002 Palpitations: Secondary | ICD-10-CM

## 2014-01-21 DIAGNOSIS — I422 Other hypertrophic cardiomyopathy: Secondary | ICD-10-CM | POA: Diagnosis not present

## 2014-01-21 DIAGNOSIS — R079 Chest pain, unspecified: Secondary | ICD-10-CM

## 2014-01-21 LAB — MDC_IDC_ENUM_SESS_TYPE_INCLINIC

## 2014-01-21 LAB — PACEMAKER DEVICE OBSERVATION

## 2014-01-21 MED ORDER — ATENOLOL 25 MG PO TABS: 12.5000 mg | ORAL_TABLET | Freq: Every day | ORAL | Status: DC | PRN

## 2014-01-21 NOTE — Patient Instructions (Signed)
Your physician recommends that you schedule a follow-up appointment in: 6 Months  

## 2014-01-28 ENCOUNTER — Encounter: Payer: Self-pay | Admitting: Cardiovascular Disease

## 2014-01-28 NOTE — Progress Notes (Signed)
Patient ID: Autumn Fitzgerald, female   DOB: [birth year], 68 y.o.   MRN: [id number]     Reason for office visit Hypertrophic cardiomyopathy, precordial followup  Autumn Fitzgerald has a history of hypertrophic obstructive cardiomyopathy but underwent septal myectomy many years ago in [place]. She has been troubled by palpitations and presyncope and had a loop recorder implanted almost a year ago. It has not recorded any meaningful arrhythmia. She continues to have anxiety and constant dizziness and occasional chest discomfort especially if she coughs. Overall though, she appears happier. She is intimately involved in her grandchildren; this has had a positive impact on her outlook. Since her loop recorder was implanted she does not have any syncopal events. No abnormal rhythm recordings on the loop recorder.   No Known Allergies  Current Outpatient Prescriptions  Medication Sig Dispense Refill  . acetaminophen (TYLENOL) 500 MG tablet Take 500 mg by mouth every 6 (six) hours as needed for mild pain.       Marland Kitchen albuterol (PROVENTIL HFA;VENTOLIN HFA) 108 (90 BASE) MCG/ACT inhaler Inhale 2 puffs into the lungs every 6 (six) hours as needed for wheezing or shortness of breath.      Marland Kitchen aspirin 81 MG tablet Take 81 mg by mouth daily.      Marland Kitchen atenolol (TENORMIN) 25 MG tablet Take 0.5-1 tablets (12.5-25 mg total) by mouth daily as needed (for high blood pressure as needed.).  30 tablet  6  . B Complex-C (SUPER B COMPLEX PO) Take 1 tablet by mouth daily.      . Cholecalciferol (VITAMIN D-3) 5000 UNITS TABS Take 5,000 Units by mouth daily.       . clonazePAM (KLONOPIN) 1 MG tablet Take 1-2 mg by mouth See admin instructions. Take 1 mg daily if needed and take 2 mg at bedtime scheduled      . ezetimibe-simvastatin (VYTORIN) 10-40 MG per tablet Take 0.5 tablets by mouth at bedtime.       Marland Kitchen FLUoxetine (PROZAC) 20 MG capsule Take 20 mg by mouth every morning.       . Misc Natural Products (OSTEO BI-FLEX ADV DOUBLE ST)  TABS Take 2 tablets by mouth 2 (two) times daily.       . mometasone (ASMANEX) 220 MCG/INH inhaler Inhale 1 puff into the lungs daily as needed (for shortness of breath).       . Multiple Vitamin (MULTIVITAMIN) tablet Take 1 tablet by mouth daily.      . pantoprazole (PROTONIX) 40 MG tablet Take 40 mg by mouth daily.      Vladimir Faster Glycol-Propyl Glycol (SYSTANE) 0.4-0.3 % SOLN Apply 1 drop to eye 2 (two) times daily.        No current facility-administered medications for this visit.    Past Medical History  Diagnosis Date  . Panic attacks   . 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 GERD (gastroesophageal reflux disease)   . HLD (hyperlipidemia)   . Asthma   . Palpitations   . Near syncope   . Chest wall pain   . Hypertension     at times,  . Mental disorder   . Depression   . Head injury, closed, without LOC 02/2013    did not LOC per patient  . Fibromyalgia     pt denies.  "Never told she has fibromyalagia"  . Arthritis     Past Surgical History  Procedure Laterality Date  . 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 Tubal ligation    .  Cholecystectomy    . Loop recorder implant  03/18/2013  . Septal myectomy    . Nuclear stress test  11/30/2011    No ischemia  . Doppler echocardiography  03/14/2013    EF 65-70%,trivial AI,mild-mod. MR,Mod. TR,mod. concentric hypertrophy  . Knee arthroscopy Right 10/17/2013    Procedure: ARTHROSCOPY RIGHT KNEE;  Surgeon: [name], MD;  Location: [facility];  Service: Orthopedics;  Laterality: Right;  . Esophagogastroduodenoscopy N/A 11/24/2013    Procedure: ESOPHAGOGASTRODUODENOSCOPY (EGD);  Surgeon: Juanita Craver, MD;  Location: [facility];  Service: Endoscopy;  Laterality: N/A;  . Colonoscopy N/A 11/24/2013    Procedure: COLONOSCOPY;  Surgeon: Juanita Craver, MD;  Location: [facility];  Service: Endoscopy;  Laterality: N/A;    Family History  Problem Relation Age of Onset  . Cancer Mother     breast  . Hypertension  Mother   . Cancer Father     prostate  . Diabetes Sister   . Heart attack Sister     History   Social History  . Marital Status: Divorced    Spouse Name: N/A    Number of Children: N/A  . Years of Education: N/A   Occupational History  . Not on file.   Social History Main Topics  . Smoking status: Never Smoker   . Smokeless tobacco: Not on file  . Alcohol Use: No  . Drug Use: No  . Sexual Activity: No     Comment: divorced   Other Topics Concern  . Not on file   Social History Narrative  . No narrative on file    Review of systems: The patient specifically denies any chest pain at rest or with exertion, dyspnea at rest or with exertion, orthopnea, paroxysmal nocturnal dyspnea, syncope, palpitations, focal neurological deficits, intermittent claudication, lower extremity edema, unexplained weight gain, cough, hemoptysis or wheezing.  The patient also denies abdominal pain, nausea, vomiting, dysphagia, diarrhea, constipation, polyuria, polydipsia, dysuria, hematuria, frequency, urgency, abnormal bleeding or bruising, fever, chills, unexpected weight changes, mood swings, change in skin or hair texture, change in voice quality, auditory or visual problems, allergic reactions or rashes, new musculoskeletal complaints other than usual "aches and pains".   PHYSICAL EXAM BP 122/76  Pulse 86  Resp 16  Ht 5' (1.524 m)  Wt 58.333 kg (128 lb 9.6 oz)  BMI 25.12 kg/m2  General: Alert, oriented x3, no distress Head: no evidence of trauma, PERRL, EOMI, no exophtalmos or lid lag, no myxedema, no xanthelasma; normal ears, nose and oropharynx Neck: normal jugular venous pulsations and no hepatojugular reflux; brisk carotid pulses without delay and no carotid bruits Chest: clear to auscultation, no signs of consolidation by percussion or palpation, normal fremitus, symmetrical and full respiratory excursions Cardiovascular: normal position and quality of the apical impulse, regular  rhythm, normal first and paradoxically split second heart sounds, no murmurs, rubs or gallops Abdomen: no tenderness or distention, no masses by palpation, no abnormal pulsatility or arterial bruits, normal bowel sounds, no hepatosplenomegaly Extremities: no clubbing, cyanosis or edema; 2+ radial, ulnar and brachial pulses bilaterally; 2+ right femoral, posterior tibial and dorsalis pedis pulses; 2+ left femoral, posterior tibial and dorsalis pedis pulses; no subclavian or femoral bruits Neurological: grossly nonfocal   EKG: Normal sinus rhythm, left bundle branch block  Lipid Panel     Component Value Date/Time   CHOL 145 09/23/2008 2026   TRIG 94 09/23/2008 2026   HDL 53 09/23/2008 2026   CHOLHDL 2.7 Ratio 09/23/2008 2026   VLDL  19 09/23/2008 2026   LDLCALC 73 09/23/2008 2026    BMET    Component Value Date/Time   NA 139 10/17/2013 1257   K 4.0 10/17/2013 1257   CL 104 10/17/2013 1257   CO2 25 10/17/2013 1257   GLUCOSE 83 10/17/2013 1257   BUN 12 10/17/2013 1257   CREATININE 0.71 10/17/2013 1257   CALCIUM 9.0 10/17/2013 1257   GFRNONAA 87* 10/17/2013 1257   GFRAA >90 10/17/2013 1257     ASSESSMENT AND PLAN Mrs Mancebo has no evidence of residual LV outflow tract obstruction since her myectomy. Her loop recorder has not recorded any meaningful ventricular arrhythmia. No substrate for syncope is therefore identified. She has not tolerated beta blocker therapy even the tiniest amounts. No changes are made to medical regimen today.  Patient Instructions  Your physician recommends that you schedule a follow-up appointment in: 6 Months.     Orders Placed This Encounter  Procedures  . Implantable device check  . EKG 12-Lead   Meds ordered this encounter  Medications  . aspirin 81 MG tablet    Sig: Take 81 mg by mouth daily.  Marland Kitchen atenolol (TENORMIN) 25 MG tablet    Sig: Take 0.5-1 tablets (12.5-25 mg total) by mouth daily as needed (for high blood pressure as needed.).     Dispense:  30 tablet    Refill:  Napa Nayara Taplin, MD, [facility] [phone] office [phone] pager

## 2014-02-02 LAB — MDC_IDC_ENUM_SESS_TYPE_REMOTE: Date Time Interrogation Session: 20150123050500

## 2014-02-12 ENCOUNTER — Encounter: Payer: Self-pay | Admitting: Cardiovascular Disease

## 2014-02-20 DIAGNOSIS — K317 Polyp of stomach and duodenum: Secondary | ICD-10-CM | POA: Insufficient documentation

## 2014-03-05 ENCOUNTER — Ambulatory Visit (INDEPENDENT_AMBULATORY_CARE_PROVIDER_SITE_OTHER): Payer: Medicare Other | Admitting: *Deleted

## 2014-03-05 DIAGNOSIS — R55 Syncope and collapse: Secondary | ICD-10-CM

## 2014-03-05 LAB — MDC_IDC_ENUM_SESS_TYPE_REMOTE: Date Time Interrogation Session: 20150321040500

## 2014-03-05 LAB — PACEMAKER DEVICE OBSERVATION

## 2014-03-24 ENCOUNTER — Ambulatory Visit (INDEPENDENT_AMBULATORY_CARE_PROVIDER_SITE_OTHER): Payer: Medicare Other | Admitting: *Deleted

## 2014-03-24 DIAGNOSIS — R002 Palpitations: Secondary | ICD-10-CM

## 2014-03-24 LAB — MDC_IDC_ENUM_SESS_TYPE_REMOTE: Date Time Interrogation Session: 20150507040500

## 2014-05-06 ENCOUNTER — Ambulatory Visit (INDEPENDENT_AMBULATORY_CARE_PROVIDER_SITE_OTHER): Payer: Medicare Other | Admitting: *Deleted

## 2014-05-06 DIAGNOSIS — R002 Palpitations: Secondary | ICD-10-CM

## 2014-05-06 LAB — MDC_IDC_ENUM_SESS_TYPE_REMOTE: Date Time Interrogation Session: 20150527040500

## 2014-05-06 LAB — PACEMAKER DEVICE OBSERVATION

## 2014-05-10 ENCOUNTER — Emergency Department (HOSPITAL_COMMUNITY): Payer: Medicare Other

## 2014-05-10 ENCOUNTER — Encounter (HOSPITAL_COMMUNITY): Payer: Self-pay | Admitting: Emergency Medicine

## 2014-05-10 ENCOUNTER — Emergency Department (HOSPITAL_COMMUNITY)
Admission: EM | Admit: 2014-05-10 | Discharge: 2014-05-10 | Disposition: A | Discharge status: Home / Self Care | Payer: Medicare Other | Attending: Emergency Medicine | Admitting: Emergency Medicine

## 2014-05-10 DIAGNOSIS — Z951 Presence of aortocoronary bypass graft: Secondary | ICD-10-CM | POA: Insufficient documentation

## 2014-05-10 DIAGNOSIS — K219 Gastro-esophageal reflux disease without esophagitis: Secondary | ICD-10-CM | POA: Insufficient documentation

## 2014-05-10 DIAGNOSIS — R112 Nausea with vomiting, unspecified: Secondary | ICD-10-CM | POA: Diagnosis not present

## 2014-05-10 DIAGNOSIS — Z79899 Other long term (current) drug therapy: Secondary | ICD-10-CM | POA: Insufficient documentation

## 2014-05-10 DIAGNOSIS — F410 Panic disorder [episodic paroxysmal anxiety] without agoraphobia: Secondary | ICD-10-CM | POA: Insufficient documentation

## 2014-05-10 DIAGNOSIS — F3289 Other specified depressive episodes: Secondary | ICD-10-CM | POA: Insufficient documentation

## 2014-05-10 DIAGNOSIS — R109 Unspecified abdominal pain: Secondary | ICD-10-CM

## 2014-05-10 DIAGNOSIS — Z7982 Long term (current) use of aspirin: Secondary | ICD-10-CM | POA: Insufficient documentation

## 2014-05-10 DIAGNOSIS — E785 Hyperlipidemia, unspecified: Secondary | ICD-10-CM | POA: Insufficient documentation

## 2014-05-10 DIAGNOSIS — Z87828 Personal history of other (healed) physical injury and trauma: Secondary | ICD-10-CM | POA: Insufficient documentation

## 2014-05-10 DIAGNOSIS — F329 Major depressive disorder, single episode, unspecified: Secondary | ICD-10-CM | POA: Insufficient documentation

## 2014-05-10 DIAGNOSIS — J45909 Unspecified asthma, uncomplicated: Secondary | ICD-10-CM | POA: Insufficient documentation

## 2014-05-10 DIAGNOSIS — R197 Diarrhea, unspecified: Secondary | ICD-10-CM

## 2014-05-10 DIAGNOSIS — I10 Essential (primary) hypertension: Secondary | ICD-10-CM | POA: Insufficient documentation

## 2014-05-10 DIAGNOSIS — M129 Arthropathy, unspecified: Secondary | ICD-10-CM | POA: Insufficient documentation

## 2014-05-10 LAB — CBC WITH DIFFERENTIAL/PLATELET
Basophils Absolute: 0 10*3/uL (ref 0.0–0.1)
Basophils Relative: 0 % (ref 0–1)
Eosinophils Absolute: 0 10*3/uL (ref 0.0–0.7)
Eosinophils Relative: 0 % (ref 0–5)
HCT: 42.9 % (ref 36.0–46.0)
Hemoglobin: 14.8 g/dL (ref 12.0–15.0)
Lymphocytes Relative: 16 % (ref 12–46)
Lymphs Abs: 1.5 10*3/uL (ref 0.7–4.0)
MCH: 29.7 pg (ref 26.0–34.0)
MCHC: 34.5 g/dL (ref 30.0–36.0)
MCV: 86.1 fL (ref 78.0–100.0)
Monocytes Absolute: 0.7 10*3/uL (ref 0.1–1.0)
Monocytes Relative: 8 % (ref 3–12)
Neutro Abs: 7.1 10*3/uL (ref 1.7–7.7)
Neutrophils Relative %: 76 % (ref 43–77)
Platelets: 286 10*3/uL (ref 150–400)
RBC: 4.98 MIL/uL (ref 3.87–5.11)
RDW: 13.3 % (ref 11.5–15.5)
WBC: 9.3 10*3/uL (ref 4.0–10.5)

## 2014-05-10 LAB — URINALYSIS, ROUTINE W REFLEX MICROSCOPIC
Glucose, UA: NEGATIVE mg/dL
Ketones, ur: 15 mg/dL — AB
Nitrite: NEGATIVE
Protein, ur: 30 mg/dL — AB
Specific Gravity, Urine: 1.027 (ref 1.005–1.030)
Urobilinogen, UA: 0.2 mg/dL (ref 0.0–1.0)
pH: 5.5 (ref 5.0–8.0)

## 2014-05-10 LAB — COMPREHENSIVE METABOLIC PANEL
ALT: 20 U/L (ref 0–35)
AST: 28 U/L (ref 0–37)
Albumin: 4.4 g/dL (ref 3.5–5.2)
Alkaline Phosphatase: 136 U/L — ABNORMAL HIGH (ref 39–117)
BUN: 16 mg/dL (ref 6–23)
CO2: 23 mEq/L (ref 19–32)
Calcium: 9.5 mg/dL (ref 8.4–10.5)
Chloride: 96 mEq/L (ref 96–112)
Creatinine, Ser: 0.87 mg/dL (ref 0.50–1.10)
GFR calc Af Amer: 77 mL/min — ABNORMAL LOW (ref >90)
GFR calc non Af Amer: 66 mL/min — ABNORMAL LOW (ref >90)
Glucose, Bld: 100 mg/dL — ABNORMAL HIGH (ref 70–99)
Potassium: 4 mEq/L (ref 3.7–5.3)
Sodium: 137 mEq/L (ref 137–147)
Total Bilirubin: 2.1 mg/dL — ABNORMAL HIGH (ref 0.3–1.2)
Total Protein: 8.2 g/dL (ref 6.0–8.3)

## 2014-05-10 LAB — LIPASE, BLOOD: Lipase: 18 U/L (ref 11–59)

## 2014-05-10 LAB — URINE MICROSCOPIC-ADD ON

## 2014-05-10 LAB — TROPONIN I: Troponin I: <0.3 ng/mL (ref <0.30)

## 2014-05-10 MED ORDER — ONDANSETRON HCL 4 MG/2ML IJ SOLN
4.0000 mg | Freq: Once | INTRAMUSCULAR | Status: AC
  Administered 2014-05-10: 4 mg via INTRAVENOUS
  Filled 2014-05-10: qty 2

## 2014-05-10 MED ORDER — SODIUM CHLORIDE 0.9 % IV BOLUS (SEPSIS)
1000.0000 mL | Freq: Once | INTRAVENOUS | Status: AC
  Administered 2014-05-10: 1000 mL via INTRAVENOUS

## 2014-05-10 MED ORDER — ONDANSETRON 8 MG PO TBDP: 8.0000 mg | ORAL_TABLET | Freq: Three times a day (TID) | ORAL | Status: DC | PRN

## 2014-05-10 NOTE — ED Notes (Signed)
Per ems, pt from home. Has hx of bowel issues, has had gallbladder removed. Pt states she is supposed to be eating a bland diet, three days ago cooked some ribs and thinks this started her abdominal issues. Pt has had N/V/D x3 days. Intermittent abdominal cramping. Denies cramping at this time. Pt is ambulatory in room to the bathroom with steady gait. AAOX4 in NAD.

## 2014-05-10 NOTE — ED Notes (Signed)
Pt tolerating oral fluids and cracker.

## 2014-05-10 NOTE — ED Provider Notes (Signed)
  Face-to-face evaluation   History: Ill for several days with vomiting and diarrhea. No fever, hematemesis, or melena  Physical exam: Alert, elderly patient, in mild pain. Ambulates easily. Skin has normal turgor  Medical screening examination/treatment/procedure(s) were conducted as a shared visit with non-physician practitioner(s) and myself.  I personally evaluated the patient during the encounter  Richarda Blade, MD 05/10/14 [phone]

## 2014-05-10 NOTE — ED Notes (Signed)
Pt returned from xray

## 2014-05-10 NOTE — ED Provider Notes (Signed)
CSN: [id number]     Arrival date & time 05/10/14  1311 History   First MD Initiated Contact with Patient 05/10/14 1326     Chief Complaint  Patient presents with  . Nausea  . Emesis  . Diarrhea  . Abdominal Cramping     (Consider location/radiation/quality/duration/timing/severity/associated sxs/prior Treatment) HPI Autumn Fitzgerald is a 68 y.o. female who presents to emergency department complaining of nausea, vomiting, diarrhea, abdominal pain. Patient states symptoms began yesterday. Patient reports watery diarrhea, greater than 5 episodes, associated nausea and vomiting. She states she's unable to keep anything down. States pain is diffuse, describes as "growling and cramping." She did not take any medications for this prior to coming in. She feels like her stomach is swollen. She states she has had prior surgeries for gallbladder removal. No other abdominal issues. She states she had colonoscopy by Dr. Collene Mares a year ago. No prior similar episodes. She denies any blood in her stool in or in her emesis. She denies any recent travel. She denies any recent antibiotics. No urinary symptoms. No fever.   Past Medical History  Diagnosis Date  . Panic attacks   . 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 GERD (gastroesophageal reflux disease)   . HLD (hyperlipidemia)   . Asthma   . Palpitations   . Near syncope   . Chest wall pain   . Hypertension     at times,  . Mental disorder   . Depression   . Head injury, closed, without LOC 02/2013    did not LOC per patient  . Fibromyalgia     pt denies.  "Never told she has fibromyalagia"  . Arthritis    Past Surgical History  Procedure Laterality Date  . 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 Tubal ligation    . Cholecystectomy    . Loop recorder implant  03/18/2013  . Septal myectomy    . Nuclear stress test  11/30/2011    No ischemia  . Doppler echocardiography  03/14/2013    EF 65-70%,trivial AI,mild-mod. MR,Mod. TR,mod.  concentric hypertrophy  . Knee arthroscopy Right 10/17/2013    Procedure: ARTHROSCOPY RIGHT KNEE;  Surgeon: [name], MD;  Location: [facility];  Service: Orthopedics;  Laterality: Right;  . Esophagogastroduodenoscopy N/A 11/24/2013    Procedure: ESOPHAGOGASTRODUODENOSCOPY (EGD);  Surgeon: Juanita Craver, MD;  Location: [facility];  Service: Endoscopy;  Laterality: N/A;  . Colonoscopy N/A 11/24/2013    Procedure: COLONOSCOPY;  Surgeon: Juanita Craver, MD;  Location: [facility];  Service: Endoscopy;  Laterality: N/A;   Family History  Problem Relation Age of Onset  . Cancer Mother     breast  . Hypertension Mother   . Cancer Father     prostate  . Diabetes Sister   . Heart attack Sister    History  Substance Use Topics  . Smoking status: Never Smoker   . Smokeless tobacco: Not on file  . Alcohol Use: No   OB History   Grav Para Term Preterm Abortions TAB SAB Ect Mult Living   5 5 5       4      Review of Systems  Constitutional: Negative for fever and chills.  Respiratory: Negative for cough, chest tightness and shortness of breath.   Cardiovascular: Negative for chest pain, palpitations and leg swelling.  Gastrointestinal: Positive for nausea, vomiting, abdominal pain and diarrhea.  Genitourinary: Negative for dysuria, flank pain, vaginal bleeding, vaginal discharge, vaginal pain and pelvic pain.  Musculoskeletal:  Negative for arthralgias, myalgias, neck pain and neck stiffness.  Skin: Negative for rash.  Neurological: Negative for dizziness, weakness and headaches.  All other systems reviewed and are negative.     Allergies  Review of patient's allergies indicates no known allergies.  Home Medications   Prior to Admission medications   Medication Sig Start Date End Date Taking? Authorizing Provider  acetaminophen (TYLENOL) 500 MG tablet Take 500 mg by mouth every 6 (six) hours as needed for mild pain.     Historical Provider, MD  albuterol (PROVENTIL HFA;VENTOLIN  HFA) 108 (90 BASE) MCG/ACT inhaler Inhale 2 puffs into the lungs every 6 (six) hours as needed for wheezing or shortness of breath.    Historical Provider, MD  aspirin 81 MG tablet Take 81 mg by mouth daily.    Historical Provider, MD  atenolol (TENORMIN) 25 MG tablet Take 0.5-1 tablets (12.5-25 mg total) by mouth daily as needed (for high blood pressure as needed.). 01/21/14   Mihai Croitoru, MD  B Complex-C (SUPER B COMPLEX PO) Take 1 tablet by mouth daily.    Historical Provider, MD  Cholecalciferol (VITAMIN D-3) 5000 UNITS TABS Take 5,000 Units by mouth daily.     Historical Provider, MD  clonazePAM (KLONOPIN) 1 MG tablet Take 1-2 mg by mouth See admin instructions. Take 1 mg daily if needed and take 2 mg at bedtime scheduled    Historical Provider, MD  ezetimibe-simvastatin (VYTORIN) 10-40 MG per tablet Take 0.5 tablets by mouth at bedtime.     Historical Provider, MD  FLUoxetine (PROZAC) 20 MG capsule Take 20 mg by mouth every morning.     Historical Provider, MD  Misc Natural Products (OSTEO BI-FLEX ADV DOUBLE ST) TABS Take 2 tablets by mouth 2 (two) times daily.     Historical Provider, MD  mometasone [facility]) 220 MCG/INH inhaler Inhale 1 puff into the lungs daily as needed (for shortness of breath).     Historical Provider, MD  Multiple Vitamin (MULTIVITAMIN) tablet Take 1 tablet by mouth daily.    Historical Provider, MD  pantoprazole (PROTONIX) 40 MG tablet Take 40 mg by mouth daily.    Historical Provider, MD  Polyethyl Glycol-Propyl Glycol (SYSTANE) 0.4-0.3 % SOLN Apply 1 drop to eye 2 (two) times daily.     Historical Provider, MD   BP 129/80  Pulse 102  Temp(Src) 98.1 F (36.7 C) (Oral)  Resp 18  Ht 5' (1.524 m)  Wt 128 lb (58.06 kg)  BMI 25.00 kg/m2  SpO2 100% Physical Exam  Nursing note and vitals reviewed. Constitutional: She appears well-developed and well-nourished. No distress.  HENT:  Head: Normocephalic.  Oral mucosa dry  Eyes: Conjunctivae are normal.  Neck: Neck  supple.  Cardiovascular: Normal rate, regular rhythm and normal heart sounds.   Pulmonary/Chest: Effort normal and breath sounds normal. No respiratory distress. She has no wheezes. She has no rales.  Abdominal: Soft. Bowel sounds are normal. She exhibits no distension. There is tenderness. There is no rebound and no guarding.  Diffuse tenderness  Musculoskeletal: She exhibits no edema.  Neurological: She is alert.  Skin: Skin is warm and dry.  Psychiatric: She has a normal mood and affect. Her behavior is normal.    ED Course  Procedures (including critical care time) Labs Review Labs Reviewed  COMPREHENSIVE METABOLIC PANEL - Abnormal; Notable for the following:    Glucose, Bld 100 (*)    Alkaline Phosphatase 136 (*)    Total Bilirubin 2.1 (*)  GFR calc non Af Amer 66 (*)    GFR calc Af Amer 77 (*)    All other components within normal limits  URINALYSIS, ROUTINE W REFLEX MICROSCOPIC - Abnormal; Notable for the following:    Color, Urine AMBER (*)    APPearance TURBID (*)    Hgb urine dipstick MODERATE (*)    Bilirubin Urine SMALL (*)    Ketones, ur 15 (*)    Protein, ur 30 (*)    Leukocytes, UA SMALL (*)    All other components within normal limits  URINE MICROSCOPIC-ADD ON - Abnormal; Notable for the following:    Casts GRANULAR CAST (*)    All other components within normal limits  URINE CULTURE  CBC WITH DIFFERENTIAL  LIPASE, BLOOD  TROPONIN I    Imaging Review Dg Abd Acute W/chest  05/10/2014   CLINICAL DATA:  Abdomen pain yesterday, right-sided chest pain today  EXAM: ACUTE ABDOMEN SERIES (ABDOMEN 2 VIEW & CHEST 1 VIEW)  COMPARISON:  07/04/2013  FINDINGS: Heart size and vascular pattern are normal.  Lungs are clear.  No free air. Nonspecific air-fluid levels in the ascending colon and and a few loops of small bowel on the right and left side. No abnormally dilated loops of bowel. Right upper quadrant cholecystectomy clips noted.  IMPRESSION: Nonspecific gas pattern  suggesting gastroenteritis with no evidence of obstruction or acute cardiopulmonary process.   Electronically Signed   By: Skipper Cliche M.D.   On: 05/10/2014 14:45     EKG Interpretation   Date/Time:  Sunday May 10 2014 13:23:35 EDT Ventricular Rate:  107 PR Interval:  127 QRS Duration: 121 QT Interval:  364 QTC Calculation: 486 R Axis:   80 Text Interpretation:  Sinus tachycardia Consider right atrial enlargement  Left bundle branch block since last tracing no significant change  Confirmed by WENTZ  MD, Vira Agar [id number]) on 05/10/2014 4:21:12 PM      MDM   Final diagnoses:  Nausea vomiting and diarrhea  Abdominal pain   Is an emergency department with nausea, vomiting, diarrhea, diffuse abdominal pain. Abdomen is soft, no significant tenderness, no guarding, no peritoneal signs. Will get labs, acute abd, UA.    4:17 PM  Patient has normal CBC, mildly elevated bilirubin of 2.1 otherwise normal comprehensive metabolic panel. Urinalysis a 7-10 white blood cells, small leukocytes, no bacteria. We'll send cultures. Given patient's symptoms of nausea, vomiting, diarrhea I suspect this is most likely a gastrointestinal infection, most likely viral. There is no blood in her stool or emesis. She states her Dr. has not had similar symptoms recently as well. I did review her prior notes of visits, she had a colonoscopy performed by Dr. Collene Mares a year ago which did not show any abnormalities or diverticuli. Patient feels better after just one dose of Zofran IV fluids. There has not been any vomiting or diarrhea in emergency department. Abdomen reassessed, continues to be soft, nontender at this time. Will discharge home with Zofran prescription and close outpatient followup. Return precautions discussed. All questions answered.  Filed Vitals:   05/10/14 1323 05/10/14 1400 05/10/14 1500 05/10/14 1600  BP: 129/80 125/84 109/74 104/90  Pulse: 102 97 84 84  Temp: 98.1 F (36.7 C)     TempSrc: Oral      Resp: 18 25 21 18   Height: 5' (1.524 m)     Weight: 128 lb (58.06 kg)     SpO2: 100% 100% 100% 100%     Giordano Getman A Huy Majid,  PA-C 05/10/14 1623

## 2014-05-10 NOTE — Discharge Instructions (Signed)
Drink plenty of fluids at home. Zofran as prescribed as needed for nausea. Followup with primary care doctor. Return if worsening.   Viral Gastroenteritis Viral gastroenteritis is also known as stomach flu. This condition affects the stomach and intestinal tract. It can cause sudden diarrhea and vomiting. The illness typically lasts 3 to 8 days. Most people develop an immune response that eventually gets rid of the virus. While this natural response develops, the virus can make you quite ill. CAUSES  Many different viruses can cause gastroenteritis, such as rotavirus or noroviruses. You can catch one of these viruses by consuming contaminated food or water. You may also catch a virus by sharing utensils or other personal items with an infected person or by touching a contaminated surface. SYMPTOMS  The most common symptoms are diarrhea and vomiting. These problems can cause a severe loss of body fluids (dehydration) and a body salt (electrolyte) imbalance. Other symptoms may include:  Fever.  Headache.  Fatigue.  Abdominal pain. DIAGNOSIS  Your caregiver can usually diagnose viral gastroenteritis based on your symptoms and a physical exam. A stool sample may also be taken to test for the presence of viruses or other infections. TREATMENT  This illness typically goes away on its own. Treatments are aimed at rehydration. The most serious cases of viral gastroenteritis involve vomiting so severely that you are not able to keep fluids down. In these cases, fluids must be given through an intravenous line (IV). HOME CARE INSTRUCTIONS   Drink enough fluids to keep your urine clear or pale yellow. Drink small amounts of fluids frequently and increase the amounts as tolerated.  Ask your caregiver for specific rehydration instructions.  Avoid:  Foods high in sugar.  Alcohol.  Carbonated drinks.  Tobacco.  Juice.  Caffeine drinks.  Extremely hot or cold fluids.  Fatty, greasy  foods.  Too much intake of anything at one time.  Dairy products until 24 to 48 hours after diarrhea stops.  You may consume probiotics. Probiotics are active cultures of beneficial bacteria. They may lessen the amount and number of diarrheal stools in adults. Probiotics can be found in yogurt with active cultures and in supplements.  Wash your hands well to avoid spreading the virus.  Only take over-the-counter or prescription medicines for pain, discomfort, or fever as directed by your caregiver. Do not give aspirin to children. Antidiarrheal medicines are not recommended.  Ask your caregiver if you should continue to take your regular prescribed and over-the-counter medicines.  Keep all follow-up appointments as directed by your caregiver. SEEK IMMEDIATE MEDICAL CARE IF:   You are unable to keep fluids down.  You do not urinate at least once every 6 to 8 hours.  You develop shortness of breath.  You notice blood in your stool or vomit. This may look like coffee grounds.  You have abdominal pain that increases or is concentrated in one small area (localized).  You have persistent vomiting or diarrhea.  You have a fever.  The patient is a child younger than 3 months, and he or she has a fever.  The patient is a child older than 3 months, and he or she has a fever and persistent symptoms.  The patient is a child older than 3 months, and he or she has a fever and symptoms suddenly get worse.  The patient is a baby, and he or she has no tears when crying. MAKE SURE YOU:   Understand these instructions.  Will watch your condition.  Will get help right away if you are not doing well or get worse. Document Released: 11/27/2005 Document Revised: 02/19/2012 Document Reviewed: 09/13/2011 [facility] Patient Information 2014 [place].

## 2014-05-11 LAB — URINE CULTURE: Colony Count: 30000

## 2014-05-11 NOTE — ED Provider Notes (Signed)
Medical screening examination/treatment/procedure(s) were performed by non-physician practitioner and as supervising physician I was immediately available for consultation/collaboration.   EKG Interpretation   Date/Time:  Sunday May 10 2014 13:23:35 EDT Ventricular Rate:  107 PR Interval:  127 QRS Duration: 121 QT Interval:  364 QTC Calculation: 486 R Axis:   80 Text Interpretation:  Sinus tachycardia Consider right atrial enlargement  Left bundle branch block since last tracing no significant change  Confirmed by Eulis Foster  MD, Kaylana Fenstermacher [phone]) on 05/10/2014 4:21:12 PM       Richarda Blade, MD 05/11/14 1123

## 2014-05-19 DIAGNOSIS — J45909 Unspecified asthma, uncomplicated: Secondary | ICD-10-CM | POA: Diagnosis not present

## 2014-05-19 DIAGNOSIS — J309 Allergic rhinitis, unspecified: Secondary | ICD-10-CM | POA: Diagnosis not present

## 2014-05-20 ENCOUNTER — Encounter: Payer: Self-pay | Admitting: Cardiovascular Disease

## 2014-06-02 DIAGNOSIS — L940 Localized scleroderma [morphea]: Secondary | ICD-10-CM | POA: Diagnosis not present

## 2014-06-03 ENCOUNTER — Ambulatory Visit (INDEPENDENT_AMBULATORY_CARE_PROVIDER_SITE_OTHER): Payer: Medicare Other | Admitting: *Deleted

## 2014-06-03 ENCOUNTER — Other Ambulatory Visit: Payer: Self-pay | Admitting: Family Medicine

## 2014-06-03 DIAGNOSIS — Z1231 Encounter for screening mammogram for malignant neoplasm of breast: Secondary | ICD-10-CM

## 2014-06-03 DIAGNOSIS — R002 Palpitations: Secondary | ICD-10-CM

## 2014-06-09 NOTE — Progress Notes (Signed)
Loop recorder 

## 2014-06-10 ENCOUNTER — Encounter (HOSPITAL_COMMUNITY): Payer: Self-pay | Admitting: *Deleted

## 2014-06-10 ENCOUNTER — Inpatient Hospital Stay (HOSPITAL_COMMUNITY)
Admission: AD | Admit: 2014-06-10 | Discharge: 2014-06-10 | Disposition: A | Discharge status: Home / Self Care | Payer: Medicare Other | Source: Ambulatory Visit | Attending: Family Medicine | Admitting: Family Medicine

## 2014-06-10 DIAGNOSIS — L293 Anogenital pruritus, unspecified: Secondary | ICD-10-CM | POA: Diagnosis not present

## 2014-06-10 DIAGNOSIS — I10 Essential (primary) hypertension: Secondary | ICD-10-CM | POA: Diagnosis not present

## 2014-06-10 DIAGNOSIS — K219 Gastro-esophageal reflux disease without esophagitis: Secondary | ICD-10-CM | POA: Diagnosis not present

## 2014-06-10 DIAGNOSIS — Z951 Presence of aortocoronary bypass graft: Secondary | ICD-10-CM | POA: Diagnosis not present

## 2014-06-10 DIAGNOSIS — L940 Localized scleroderma [morphea]: Secondary | ICD-10-CM

## 2014-06-10 DIAGNOSIS — E0590 Thyrotoxicosis, unspecified without thyrotoxic crisis or storm: Secondary | ICD-10-CM | POA: Insufficient documentation

## 2014-06-10 DIAGNOSIS — L900 Lichen sclerosus et atrophicus: Secondary | ICD-10-CM

## 2014-06-10 LAB — URINALYSIS, ROUTINE W REFLEX MICROSCOPIC
Bilirubin Urine: NEGATIVE
Glucose, UA: NEGATIVE mg/dL
Ketones, ur: NEGATIVE mg/dL
Nitrite: NEGATIVE
Protein, ur: NEGATIVE mg/dL
Specific Gravity, Urine: <1.005 — ABNORMAL LOW (ref 1.005–1.030)
Urobilinogen, UA: 0.2 mg/dL (ref 0.0–1.0)
pH: 6 (ref 5.0–8.0)

## 2014-06-10 LAB — URINE MICROSCOPIC-ADD ON

## 2014-06-10 NOTE — Discharge Instructions (Signed)
Lichen Planus Lichen planus is a skin problem that causes redness, itching, swelling, and sores. Some common areas affected are the:  Vulva and vagina.  Gums and inside of the mouth.  Esophagus.  Scalp.  Skin of the arms (especially wrists), legs, chest, back, and abdomen.  Fingernails or toenails. CAUSES The cause is not known. It could be an autoimmune illness or an allergy. An autoimmune illness is one where your body attacks itself. Lichen planus is not passed from one person to another (not contagious). It can last for a long time. Affected children usually have a history of other family members with lichen planus. SYMPTOMS  Itching, which can be severe.  The vaginal area may be red, sore, raw, or have a burning feeling.  There may be pain or bleeding during sex.  Scarring may cause the vagina to become short, narrow, or even closed up.  The skin may have small reddish or purplish, flat-topped, round or irregularly shaped bumps.  There may be redness or white patches on the gums or tongue.  The nails may become thin, rough, or have ridges in them.  The eyes can rarely also be involved with pain, redness, or scarring. DIAGNOSIS Your caregiver will look for skin changes, changes inside your mouth, or vaginal discharge. Sometimes, a tissue sample (biopsy) may be sent for testing. TREATMENT  Your caregiver may order a cream (topical steroid) to be put on the sores.  You may be given medicine to take by mouth.  You may be treated by exposure to ultraviolet light.  Sores in the mouth may be treated with lozenges that you suck on like a cough drop.  If the vagina becomes too tight, you may be taught how to use a dilator to keep it open. HOME CARE INSTRUCTIONS  Only take over-the-counter or prescription medicines as directed by your caregiver.  Keep the vaginal area as clean and dry as possible. SEEK MEDICAL CARE IF: You develop increasing pain, swelling, or  redness. Document Released: 04/20/2011 Document Revised: 02/19/2012 Document Reviewed: 04/20/2011 ExitCare Patient Information 2015 [employer]. This information is not intended to replace advice given to you by your health care provider. Make sure you discuss any questions you have with your health care provider.  

## 2014-06-10 NOTE — MAU Provider Note (Signed)
History     CSN: [id number]  Arrival date and time: 06/10/14 1254   First Provider Initiated Contact with Patient 06/10/14 1407      No chief complaint on file.  HPI Comments: Patient is a 68 year old [id number] Hispanic female with a PMH of panic disorder who presents today with vaginal itching, yellow discharge and lower abdominal pain that started 1 month ago. Patient states that she went to Dr. Jonni Sanger and had a biopsy. 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 Patient denies dysuria and hematuria. Patient states that she is not sexually active. Endorses using clobetasol cream to treat the lichen sclerosus and this has resolved her vaginal itching.      Past Medical History  Diagnosis Date  . Panic attacks   . 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 GERD (gastroesophageal reflux disease)   . HLD (hyperlipidemia)   . Asthma   . Palpitations   . Near syncope   . Chest wall pain   . Hypertension     at times,  . Mental disorder   . Depression   . Head injury, closed, without LOC 02/2013    did not LOC per patient  . Arthritis   . Hyperthyroidism     Past Surgical History  Procedure Laterality Date  . 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 Tubal ligation    . Cholecystectomy    . Loop recorder implant  03/18/2013  . Septal myectomy    . Nuclear stress test  11/30/2011    No ischemia  . Doppler echocardiography  03/14/2013    EF 65-70%,trivial AI,mild-mod. MR,Mod. TR,mod. concentric hypertrophy  . Knee arthroscopy Right 10/17/2013    Procedure: ARTHROSCOPY RIGHT KNEE;  Surgeon: [name], MD;  Location: [facility];  Service: Orthopedics;  Laterality: Right;  . Esophagogastroduodenoscopy N/A 11/24/2013    Procedure: ESOPHAGOGASTRODUODENOSCOPY (EGD);  Surgeon: Juanita Craver, MD;  Location: [facility];  Service: Endoscopy;  Laterality: N/A;  . Colonoscopy N/A 11/24/2013    Procedure: COLONOSCOPY;  Surgeon: Juanita Craver, MD;   Location: [facility];  Service: Endoscopy;  Laterality: N/A;    Family History  Problem Relation Age of Onset  . Cancer Mother     breast  . Hypertension Mother   . Cancer Father     prostate  . Diabetes Sister   . Heart attack Sister     History  Substance Use Topics  . Smoking status: Never Smoker   . Smokeless tobacco: Not on file  . Alcohol Use: No    Allergies:  Allergies  Allergen Reactions  . Amoxicillin-Pot Clavulanate     Prescriptions prior to admission  Medication Sig Dispense Refill  . acetaminophen (TYLENOL) 500 MG tablet Take 500 mg by mouth at bedtime.      Marland Kitchen aspirin 81 MG tablet Take 81 mg by mouth daily.      . Cholecalciferol (VITAMIN D-3) 5000 UNITS TABS Take 5,000 Units by mouth daily.       . clobetasol cream (TEMOVATE) 3.79 % Apply 1 application topically 2 (two) times daily.      . clonazePAM (KLONOPIN) 1 MG tablet Take 0.5-2 mg by mouth 2 (two) times daily. Take 0.5 tablet (0.5 mg) during the day and 2 tablets (1 mg) at bedtime      . ezetimibe-simvastatin (VYTORIN) 10-40 MG per tablet Take 0.5 tablets by mouth daily.      Marland Kitchen levothyroxine (SYNTHROID, LEVOTHROID) 75 MCG tablet Take  75 mcg by mouth daily before breakfast. Patient states that she takes 0.5 tablet daily.      . Misc Natural Products (OSTEO BI-FLEX ADV DOUBLE ST) TABS Take 1 tablet by mouth daily.       . mometasone (ASMANEX) 220 MCG/INH inhaler Inhale 1 puff into the lungs daily as needed (for shortness of breath).       . Multiple Vitamin (MULTIVITAMIN) tablet Take 1 tablet by mouth daily.      . pantoprazole (PROTONIX) 40 MG tablet Take 40 mg by mouth daily.      Marland Kitchen albuterol (PROVENTIL HFA;VENTOLIN HFA) 108 (90 BASE) MCG/ACT inhaler Inhale 2 puffs into the lungs every 6 (six) hours as needed for wheezing or shortness of breath.      Marland Kitchen atenolol (TENORMIN) 25 MG tablet Take 12.5 mg by mouth daily as needed (for high blood pressure).      Vladimir Faster Glycol-Propyl Glycol (SYSTANE)  0.4-0.3 % SOLN Apply 1 drop to eye 2 (two) times daily.         Review of Systems  Constitutional: Positive for chills. Negative for fever.  Respiratory: Negative for shortness of breath.   Cardiovascular: Positive for palpitations. Negative for chest pain.  Gastrointestinal: Positive for abdominal pain.       Mild, suprapubic area  Genitourinary: Negative for dysuria and hematuria.   Physical Exam   Blood pressure 122/79, pulse 82, temperature 99 F (37.2 C), temperature source Oral, resp. rate 18, height 5' (1.524 m), weight 58.514 kg (129 lb).  Physical Exam  Constitutional: She is oriented to person, place, and time. She appears well-developed and well-nourished.  Cardiovascular: Normal rate and regular rhythm.   Respiratory: Effort normal and breath sounds normal. She has no wheezes. She has no rales.  GI: Soft. Bowel sounds are normal. She exhibits no distension and no mass. There is tenderness. There is no rebound and no guarding.  Mild tenderness to palpation of suprapubic region  Genitourinary: There is no rash, tenderness, lesion or injury on the right labia. There is no rash, tenderness, lesion or injury on the left labia.  Vaginal mucosa with raised, yellow patches. Reexamined by Dr Kennon Rounds  Neurological: She is alert and oriented to person, place, and time.    MAU Course  Procedures Results for orders placed during the hospital encounter of 06/10/14 (from the past 24 hour(s))  URINALYSIS, ROUTINE W REFLEX MICROSCOPIC     Status: Abnormal   Collection Time    06/10/14  1:15 PM      Result Value Ref Range   Color, Urine YELLOW  YELLOW   APPearance CLEAR  CLEAR   Specific Gravity, Urine <1.005 (*) 1.005 - 1.030   pH 6.0  5.0 - 8.0   Glucose, UA NEGATIVE  NEGATIVE mg/dL   Hgb urine dipstick TRACE (*) NEGATIVE   Bilirubin Urine NEGATIVE  NEGATIVE   Ketones, ur NEGATIVE  NEGATIVE mg/dL   Protein, ur NEGATIVE  NEGATIVE mg/dL   Urobilinogen, UA 0.2  0.0 - 1.0 mg/dL    Nitrite NEGATIVE  NEGATIVE   Leukocytes, UA SMALL (*) NEGATIVE  URINE MICROSCOPIC-ADD ON     Status: None   Collection Time    06/10/14  1:15 PM      Result Value Ref Range   Squamous Epithelial / LPF RARE  RARE   WBC, UA 0-2  <3 WBC/hpf     MDM Urine Culture  Assessment and Plan  A: Lichen Sclerosus   P: Continue  with Clobetasol cream and follow up with Dr. Kennon Rounds in clinic Urine culture pending  Gaetano Hawthorne 06/10/2014, 3:06 PM

## 2014-06-10 NOTE — MAU Note (Addendum)
Went to medical dr on 06/23.  Was having vaginal itching. Now having a yellowish d/c.  Dr saw white stuff in there, dr thought it was cancer- did a biopsy.  Received call that it was not cancer, but she was not tested for anything else.  The dr did give her a cream.  Pt thought she had got some disease from the toilet. Bad itching, some time has pain in lower abd.  Urine burns

## 2014-06-12 LAB — URINE CULTURE: Colony Count: 5000

## 2014-06-18 ENCOUNTER — Encounter: Payer: Self-pay | Admitting: Family Medicine

## 2014-06-18 ENCOUNTER — Ambulatory Visit (INDEPENDENT_AMBULATORY_CARE_PROVIDER_SITE_OTHER): Payer: Medicare Other | Admitting: Family Medicine

## 2014-06-18 VITALS — BP 113/78 | HR 86 | Temp 97.4°F | Ht 60.0 in | Wt 127.9 lb

## 2014-06-18 DIAGNOSIS — L940 Localized scleroderma [morphea]: Secondary | ICD-10-CM | POA: Diagnosis not present

## 2014-06-18 DIAGNOSIS — L900 Lichen sclerosus et atrophicus: Secondary | ICD-10-CM | POA: Insufficient documentation

## 2014-06-18 NOTE — Assessment & Plan Note (Signed)
Continue clobetasol 2x/day for now.

## 2014-06-18 NOTE — Patient Instructions (Addendum)
Facts about Lichen Sclerosus  What Is Lichen Sclerosus? Lichen sclerosus is a long-term problem of the skin. It mostly affects the genital and anal areas. Sometimes, lichen sclerosus appears on the upper body, breasts, and upper arms.  Who Gets Lichen Sclerosus? Lichen sclerosus appears in: Women (often after menopause)  Men (uncommon)  Children (rare).  What Are the Symptoms? Early in the disease, small white spots appear on the skin. The spots are usually shiny and smooth. Later, the spots grow into bigger patches. The skin on the patches becomes thin and crinkled. Then the skin tears easily, and bright red or purple bruises are common. Sometimes, the skin becomes scarred. If the disease is a mild case, there may be no symptoms. Other symptoms are: Itching (very common)  Discomfort or pain  Bleeding  Blisters.  What Causes Lichen Sclerosus? Doctors don't know the exact cause of lichen sclerosus. Some doctors think a too active immune system and hormone problems may play a role. It is also thought that people inherit the likelihood of getting the disease. Sometimes, lichen sclerosus appears on skin that has been damaged or scarred from some other previous injury. Lichen sclerosus is not contagious (it can't be caught from another person).  How Is It Diagnosed? Doctors can look at severe lichen sclerosus and know what it is. But usually, a doctor takes a small piece of the skin patch (biopsy) and looks at it under a microscope. This allows doctors to make sure that it is not a different disease.  How Is It Treated? If you have patches on the arms or upper body, they usually don't need treatment. The patches go away over time. Lichen sclerosus of the genital skin should be treated. Even if it isn't painful or itchy, the patches can scar. This can cause problems with urination or sex. There is also a very small chance that skin cancer may develop in the patches. Surgery is normally a good  option for men. Circumcision (removing the foreskin on the penis) is the most widely used therapy for men with lichen sclerosus. The disease usually does not come back. Surgery is normally not a good option for women. When the lichen sclerosus patches are removed from the genitals of women and girls, they usually come back. Treatment also includes using very strong cortisone cream or ointment on the skin. You put these creams on the patches every day for several weeks. This stops the itching. Then you use the cream or ointment two times a week for a long time to keep the disease from coming back. Treatment does not fix the scarring that may have already occurred. You need regular followup by a doctor because using these creams and ointments for a long time can cause: Thinning and redness of the skin  Stretch marks where the cream is applied  Genital yeast infections. Sometimes, you don't get better when using the cortisone creams. Some things that can keep symptoms from clearing up are: Low estrogen levels  Infection  Allergy to the medication. When creams and ointments don't work, your doctor may suggest: Retinoids, or vitamin A-like drugs  Tacrolimus ointment  Ultraviolet light treatments (not used on skin of the genitals). If you need medicine, ask your doctor: How does the medicine work?  What are its side effects?  Why is it the best treatment for my lichen sclerosus? If a young girl gets lichen sclerosus, she may not require lifelong treatment. Lichen sclerosus sometimes goes away at puberty. Scarring and  changes in skin color may remain.  Can People With Lichen Sclerosus Have Sex? Women with severe lichen sclerosus in the genitals may not be able to have sex. The disease can cause scars that narrow the vagina. Also, sex can hurt and cause the patches to bleed. However, treatment with creams or ointments can help. Women with severe scarring in the vagina may need surgery, but only after  lichen sclerosus is controlled with medication.  Is Lichen Sclerosus Related to Cancer? Lichen sclerosus does not cause skin cancer. However, skin that is scarred by lichen sclerosus is more likely to develop skin cancer. If you have the disease, see the doctor every 6 to 12 months. The doctor can look at and treat any changes in the skin.  What Kind of Doctor Treats Lichen Sclerosus? Lichen sclerosus is treated by: Dermatologists (doctors who treat the skin)  Gynecologists (doctors who treat the female reproductive system)  Urologists (doctors who treat the urinary or urogenital tract)  Primary health care providers.  For More Information About Lichen Sclerosus and Other Related Conditions: [employer] [facility] Health [place] [address] Phone: [phone] Toll Free: [phone]) TTY: [phone] Fax: [phone] Email: [email or web address] Website: http://www.niams.SouthEx[employer].es For Your Information This publication contains information about medications used to treat the health condition discussed here. When this publication was developed, we included the most up-to-date (accurate) information available. Occasionally, new information on medication is released. For updates and for any questions about any medications you are taking, please contact U.S. Food and Drug Administration Toll Free: 888-INFO-FDA 743-067-6839) Website: http://www.quinn.com/ For additional information on specific medications, visit Drugs@FDA  at https://www.garcia.biz/. Drugs@FDA  is a Multimedia programmer of FDA-approved drug products. This publication is not copyrighted. Readers are encouraged to duplicate and distribute as many copies as needed. Additional copies of this publication are available from: [facility] visitor)  [place], [place] [address] Phone: [phone] Toll Free: [phone]) TTY: [phone] Fax: [phone] Email: [email or web address] Website: http://www.niams.SouthEx[employer].es Many of our publications are available in print. Would you like to order publications on skin

## 2014-06-18 NOTE — Progress Notes (Signed)
    Subjective:    Patient ID: Autumn Fitzgerald is a 68 y.o. female presenting with Follow-up  on 06/18/2014  HPI: Here for f/u of lichen sclerosus.  On Clobetasol. Taking it 2x/day.  S/p biopsy which confirmed diagnosis and no cancer present.  Review of Systems  Constitutional: Negative for fever and chills.  Respiratory: Negative for shortness of breath.   Cardiovascular: Negative for chest pain.  Gastrointestinal: Negative for abdominal pain.  Genitourinary: Positive for vaginal discharge (yellow).      Objective:    BP 113/78  Pulse 86  Temp(Src) 97.4 F (36.3 C)  Ht 5' (1.524 m)  Wt 127 lb 14.4 oz (58.015 kg)  BMI 24.98 kg/m2 Physical Exam  Constitutional: She appears well-developed and well-nourished. No distress.  HENT:  Head: Normocephalic.  Eyes: No scleral icterus.  Neck: Neck supple.  Cardiovascular: Normal rate.   Pulmonary/Chest: Effort normal.  Abdominal: Soft.  Genitourinary:  Atrophic vagina with white areas noted  Musculoskeletal: Normal range of motion.  Neurological: She is alert.  Skin: Skin is warm and dry.  Psychiatric: She has a normal mood and affect.        Assessment & Plan:  Lichen sclerosus et atrophicus Continue clobetasol 2x/day for now.     Return in about 6 weeks (around 07/30/2014) for a follow-up of lichen sclerosus.

## 2014-06-23 ENCOUNTER — Ambulatory Visit
Admission: RE | Admit: 2014-06-23 | Discharge: 2014-06-23 | Disposition: A | Discharge status: Home / Self Care | Payer: Medicare Other | Source: Ambulatory Visit | Attending: Family Medicine | Admitting: Family Medicine

## 2014-06-23 DIAGNOSIS — Z1231 Encounter for screening mammogram for malignant neoplasm of breast: Secondary | ICD-10-CM

## 2014-06-24 ENCOUNTER — Encounter: Payer: Self-pay | Admitting: Cardiology

## 2014-06-30 ENCOUNTER — Encounter: Payer: Self-pay | Admitting: Cardiovascular Disease

## 2014-07-03 ENCOUNTER — Telehealth: Payer: Self-pay | Admitting: Cardiology

## 2014-07-03 NOTE — Telephone Encounter (Signed)
Spoke with pt and requested pt send manual transmission from home due to home monitor becoming deactivated. Pt verbalized understanding.

## 2014-07-06 ENCOUNTER — Encounter: Payer: Self-pay | Admitting: Cardiovascular Disease

## 2014-07-06 ENCOUNTER — Ambulatory Visit (INDEPENDENT_AMBULATORY_CARE_PROVIDER_SITE_OTHER): Payer: Medicare Other | Admitting: *Deleted

## 2014-07-06 DIAGNOSIS — R002 Palpitations: Secondary | ICD-10-CM

## 2014-07-08 ENCOUNTER — Encounter: Payer: Self-pay | Admitting: Cardiovascular Disease

## 2014-07-08 ENCOUNTER — Other Ambulatory Visit: Payer: Self-pay | Admitting: *Deleted

## 2014-07-08 MED ORDER — ATENOLOL 25 MG PO TABS: 12.5000 mg | ORAL_TABLET | Freq: Every day | ORAL | Status: DC | PRN

## 2014-07-08 NOTE — Progress Notes (Signed)
Loop recorder 

## 2014-07-16 ENCOUNTER — Encounter: Payer: Self-pay | Admitting: Cardiovascular Disease

## 2014-07-16 ENCOUNTER — Ambulatory Visit (INDEPENDENT_AMBULATORY_CARE_PROVIDER_SITE_OTHER): Payer: Medicare Other | Admitting: Cardiovascular Disease

## 2014-07-16 VITALS — BP 130/70 | HR 84 | Resp 16 | Ht 60.0 in | Wt 129.4 lb

## 2014-07-16 DIAGNOSIS — R002 Palpitations: Secondary | ICD-10-CM

## 2014-07-16 DIAGNOSIS — I421 Obstructive hypertrophic cardiomyopathy: Secondary | ICD-10-CM

## 2014-07-16 DIAGNOSIS — I447 Left bundle-branch block, unspecified: Secondary | ICD-10-CM

## 2014-07-16 NOTE — Patient Instructions (Signed)
Dr. Croitoru recommends that you schedule a follow-up appointment in: 6 months    

## 2014-07-19 ENCOUNTER — Encounter: Payer: Self-pay | Admitting: Cardiovascular Disease

## 2014-07-19 NOTE — Progress Notes (Signed)
Patient ID: Autumn Fitzgerald, female   DOB: October 09, 1946, 68 y.o.   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 She has a chronic left bundle branch block. A couple of years ago she had a few near-syncope events as well as palpitations and received a loop recorder. This has not shown evidence of atrioventricular block or ventricular tachycardia. She's not had any further episodes of near syncope or syncope. Her most recent echocardiogram performed in 2014 showed no evidence of left ventricular outflow obstruction. She has moderate LVH, mild to moderate mitral insufficiency with a centrally directed jet and moderate tricuspid regurgitation. EF was 65-70%.   Allergies  Allergen Reactions  . Amoxicillin-Pot Clavulanate   . Statins Other (See Comments)    Myalgias with lipitor; tolerates vytorin    Current Outpatient Prescriptions  Medication Sig Dispense Refill  . acetaminophen (TYLENOL) 500 MG tablet Take 500 mg by mouth at bedtime.      Marland Kitchen albuterol (PROVENTIL HFA;VENTOLIN HFA) 108 (90 BASE) MCG/ACT inhaler Inhale 2 puffs into the lungs every 6 (six) hours as needed for wheezing or shortness of breath.      Marland Kitchen aspirin 81 MG tablet Take 81 mg by mouth daily.      Marland Kitchen atenolol (TENORMIN) 25 MG tablet Take 0.5 tablets (12.5 mg total) by mouth daily as needed (for high blood pressure).  90 tablet  0  . Cholecalciferol (VITAMIN D-3) 5000 UNITS TABS Take 5,000 Units by mouth daily.       . clobetasol cream (TEMOVATE) 2.20 % Apply 1 application topically 2 (two) times daily.      . clonazePAM (KLONOPIN) 1 MG tablet Take 0.5-2 mg by mouth 2 (two) times daily. Take 0.5 tablet (0.5 mg) during the day and 2 tablets (1 mg) at bedtime      . ezetimibe-simvastatin (VYTORIN) 10-40 MG per tablet Take 0.5 tablets by mouth  daily.      Marland Kitchen levothyroxine (SYNTHROID) 50 MCG tablet Take 25 mcg by mouth.      . Misc Natural Products (OSTEO BI-FLEX ADV DOUBLE ST) TABS Take 1 tablet by mouth daily.       . mometasone (ASMANEX) 220 MCG/INH inhaler Inhale 1 puff into the lungs daily as needed (for shortness of breath).       . Multiple Vitamin (MULTIVITAMIN) tablet Take 1 tablet by mouth daily.      . pantoprazole (PROTONIX) 40 MG tablet Take 40 mg by mouth daily.      Vladimir Faster Glycol-Propyl Glycol (SYSTANE) 0.4-0.3 % SOLN Apply 1 drop to eye 2 (two) times daily.        No current facility-administered medications for this visit.    Past Medical History  Diagnosis Date  . Panic attacks   . 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 GERD (gastroesophageal reflux disease)   . HLD (hyperlipidemia)   . Asthma   . Palpitations   . Near syncope   . Chest wall pain   . Hypertension     at times,  . Mental disorder   . Depression   . Head injury, closed, without LOC 02/2013    did not LOC per patient  . Arthritis   . Hyperthyroidism     Past Surgical History  Procedure Laterality Date  . 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 Tubal ligation    .  Cholecystectomy    . Loop recorder implant  03/18/2013  . Septal myectomy    . Nuclear stress test  11/30/2011    No ischemia  . Doppler echocardiography  03/14/2013    EF 65-70%,trivial AI,mild-mod. MR,Mod. TR,mod. concentric hypertrophy  . Knee arthroscopy Right 10/17/2013    Procedure: ARTHROSCOPY RIGHT KNEE;  Surgeon: [name], MD;  Location: [facility];  Service: Orthopedics;  Laterality: Right;  . Esophagogastroduodenoscopy N/A 11/24/2013    Procedure: ESOPHAGOGASTRODUODENOSCOPY (EGD);  Surgeon: Juanita Craver, MD;  Location: [facility];  Service: Endoscopy;  Laterality: N/A;  . Colonoscopy N/A 11/24/2013    Procedure: COLONOSCOPY;  Surgeon: Juanita Craver, MD;  Location: [facility];  Service: Endoscopy;  Laterality: N/A;    Family History    Problem Relation Age of Onset  . Cancer Mother     breast  . Hypertension Mother   . Cancer Father     prostate  . Diabetes Sister   . Heart attack Sister     History   Social History  . Marital Status: Divorced    Spouse Name: N/A    Number of Children: N/A  . Years of Education: N/A   Occupational History  . Not on file.   Social History Main Topics  . Smoking status: Never Smoker   . Smokeless tobacco: Not on file  . Alcohol Use: No  . Drug Use: No  . Sexual Activity: No     Comment: divorced   Other Topics Concern  . Not on file   Social History Narrative  . No narrative on file    Review of systems: The patient specifically denies any chest pain at rest or with exertion, dyspnea at rest or with exertion, orthopnea, paroxysmal nocturnal dyspnea, syncope, palpitations, focal neurological deficits, intermittent claudication, lower extremity edema, unexplained weight gain, cough, hemoptysis or wheezing.  The patient also denies abdominal pain, nausea, vomiting, dysphagia, diarrhea, constipation, polyuria, polydipsia, dysuria, hematuria, frequency, urgency, abnormal bleeding or bruising, fever, chills, unexpected weight changes, mood swings, change in skin or hair texture, change in voice quality, auditory or visual problems, allergic reactions or rashes, new musculoskeletal complaints other than usual "aches and pains".   PHYSICAL EXAM BP 130/70  Pulse 84  Resp 16  Ht 5' (1.524 m)  Wt 129 lb 6.4 oz (58.695 kg)  BMI 25.27 kg/m2 General: Alert, oriented x3, no distress  Head: no evidence of trauma, PERRL, EOMI, no exophtalmos or lid lag, no myxedema, no xanthelasma; normal ears, nose and oropharynx  Neck: normal jugular venous pulsations and no hepatojugular reflux; brisk carotid pulses without delay and no carotid bruits  Chest: clear to auscultation, no signs of consolidation by percussion or palpation, normal fremitus, symmetrical and full respiratory  excursions  Cardiovascular: normal position and quality of the apical impulse, regular rhythm, normal first and paradoxically split second heart sounds, no murmurs, rubs or gallops  Abdomen: no tenderness or distention, no masses by palpation, no abnormal pulsatility or arterial bruits, normal bowel sounds, no hepatosplenomegaly  Extremities: no clubbing, cyanosis or edema; 2+ radial, ulnar and brachial pulses bilaterally; 2+ right femoral, posterior tibial and dorsalis pedis pulses; 2+ left femoral, posterior tibial and dorsalis pedis pulses; no subclavian or femoral bruits  Neurological: grossly nonfocal   EKG: Normal sinus rhythm, left bundle branch block   Lipid Panel     Component Value Date/Time   CHOL 145 09/23/2008 2026   TRIG 94 09/23/2008 2026   HDL 53 09/23/2008 2026  CHOLHDL 2.7 Ratio 09/23/2008 2026   VLDL 19 09/23/2008 2026   LDLCALC 73 09/23/2008 2026    BMET    Component Value Date/Time   NA 137 05/10/2014 1336   K 4.0 05/10/2014 1336   CL 96 05/10/2014 1336   CO2 23 05/10/2014 1336   GLUCOSE 100* 05/10/2014 1336   BUN 16 05/10/2014 1336   CREATININE 0.87 05/10/2014 1336   CALCIUM 9.5 05/10/2014 1336   GFRNONAA 66* 05/10/2014 1336   GFRAA 77* 05/10/2014 1336     ASSESSMENT AND PLAN  This is a [place] has minimally symptomatic hypertrophic cardiomyopathy without evidence of any residual obstruction following septal myectomy and without evidence of ventricular arrhythmia or advanced A-V block by loop recorder monitoring. No changes were recommended to her current treatment plan.  Patient Instructions  Dr. Sallyanne Kuster recommends that you schedule a follow-up appointment in: 6 months.     Holli Humbles, MD, [facility] [phone] office [phone] pager

## 2014-07-20 ENCOUNTER — Other Ambulatory Visit: Payer: Self-pay | Admitting: *Deleted

## 2014-07-20 DIAGNOSIS — I447 Left bundle-branch block, unspecified: Secondary | ICD-10-CM

## 2014-07-20 NOTE — Addendum Note (Signed)
Addended by: Janett Labella A on: 07/20/2014 05:07 PM   Modules accepted: Orders

## 2014-07-27 ENCOUNTER — Encounter: Payer: Self-pay | Admitting: Family Medicine

## 2014-07-27 ENCOUNTER — Encounter: Payer: Self-pay | Admitting: Cardiovascular Disease

## 2014-07-27 ENCOUNTER — Ambulatory Visit (INDEPENDENT_AMBULATORY_CARE_PROVIDER_SITE_OTHER): Payer: Medicare Other | Admitting: Family Medicine

## 2014-07-27 VITALS — BP 128/77 | HR 88 | Temp 97.8°F | Wt 130.7 lb

## 2014-07-27 DIAGNOSIS — L900 Lichen sclerosus et atrophicus: Secondary | ICD-10-CM

## 2014-07-27 DIAGNOSIS — L940 Localized scleroderma [morphea]: Secondary | ICD-10-CM

## 2014-07-27 MED ORDER — CLOBETASOL PROPIONATE 0.05 % EX CREA: 1.0000 "application " | TOPICAL_CREAM | CUTANEOUS | Status: DC

## 2014-07-27 NOTE — Assessment & Plan Note (Signed)
Switch to qod dosing and f/u in 3 months.

## 2014-07-27 NOTE — Progress Notes (Signed)
    Subjective:    Patient ID: Autumn Fitzgerald is a 68 y.o. female presenting with Gynecologic Exam  on 07/27/2014  HPI: Taking temovate daily. Feels some better. Has strong FH of breast Ca. In [place] she has mammogram and U/S for screening of the breast. Looked on UpToDate and that is not completely validated.  Review of Systems  Constitutional: Negative for fever and chills.  Respiratory: Negative for shortness of breath.   Cardiovascular: Negative for chest pain.  Gastrointestinal: Negative for abdominal pain.  Genitourinary: Negative for dysuria.      Objective:    BP 128/77  Pulse 88  Temp(Src) 97.8 F (36.6 C)  Wt 130 lb 11.2 oz (59.285 kg) Physical Exam  Constitutional: She appears well-developed and well-nourished.  HENT:  Head: Normocephalic and atraumatic.  Cardiovascular: Normal rate.   Pulmonary/Chest: Effort normal.  Abdominal: Soft. There is no tenderness.  Genitourinary:  White area is limited to the anterior and posterior--  Neurological: She is alert.  Skin: Skin is warm.        Assessment & Plan:   Lichen sclerosus et atrophicus Switch to qod dosing and f/u in 3 months.    Return in about 3 months (around 10/27/2014) for a follow-up.

## 2014-07-29 LAB — MDC_IDC_ENUM_SESS_TYPE_INCLINIC
Zone Setting Detection Interval: 2000 ms
Zone Setting Detection Interval: 3000 ms
Zone Setting Detection Interval: 370 ms

## 2014-07-31 LAB — MDC_IDC_ENUM_SESS_TYPE_REMOTE
Date Time Interrogation Session: 20150724185808
Zone Setting Detection Interval: 2000 ms
Zone Setting Detection Interval: 3000 ms
Zone Setting Detection Interval: 370 ms

## 2014-08-05 ENCOUNTER — Ambulatory Visit (INDEPENDENT_AMBULATORY_CARE_PROVIDER_SITE_OTHER): Payer: Medicare Other | Admitting: *Deleted

## 2014-08-05 DIAGNOSIS — R002 Palpitations: Secondary | ICD-10-CM

## 2014-08-13 NOTE — Progress Notes (Signed)
Loop recorder 

## 2014-08-21 LAB — MDC_IDC_ENUM_SESS_TYPE_REMOTE

## 2014-08-24 ENCOUNTER — Encounter: Payer: Self-pay | Admitting: Cardiovascular Disease

## 2014-08-25 LAB — MDC_IDC_ENUM_SESS_TYPE_REMOTE
Date Time Interrogation Session: 20150824042540
Zone Setting Detection Interval: 2000 ms
Zone Setting Detection Interval: 3000 ms
Zone Setting Detection Interval: 370 ms

## 2014-09-04 ENCOUNTER — Ambulatory Visit (INDEPENDENT_AMBULATORY_CARE_PROVIDER_SITE_OTHER): Payer: Medicare Other | Admitting: *Deleted

## 2014-09-04 DIAGNOSIS — R002 Palpitations: Secondary | ICD-10-CM

## 2014-09-04 LAB — MDC_IDC_ENUM_SESS_TYPE_REMOTE
Date Time Interrogation Session: 20150923041045
Zone Setting Detection Interval: 2000 ms
Zone Setting Detection Interval: 3000 ms
Zone Setting Detection Interval: 370 ms

## 2014-09-08 ENCOUNTER — Encounter: Payer: Self-pay | Admitting: Cardiovascular Disease

## 2014-09-09 ENCOUNTER — Encounter: Payer: Self-pay | Admitting: Cardiovascular Disease

## 2014-09-14 DIAGNOSIS — Z23 Encounter for immunization: Secondary | ICD-10-CM | POA: Diagnosis not present

## 2014-09-14 DIAGNOSIS — E039 Hypothyroidism, unspecified: Secondary | ICD-10-CM | POA: Diagnosis not present

## 2014-09-14 DIAGNOSIS — K219 Gastro-esophageal reflux disease without esophagitis: Secondary | ICD-10-CM | POA: Diagnosis not present

## 2014-09-14 DIAGNOSIS — J4520 Mild intermittent asthma, uncomplicated: Secondary | ICD-10-CM | POA: Diagnosis not present

## 2014-09-18 NOTE — Progress Notes (Signed)
Loop recorder 

## 2014-09-29 ENCOUNTER — Encounter: Payer: Self-pay | Admitting: Cardiovascular Disease

## 2014-10-05 ENCOUNTER — Ambulatory Visit (INDEPENDENT_AMBULATORY_CARE_PROVIDER_SITE_OTHER): Payer: Medicare Other | Admitting: *Deleted

## 2014-10-05 DIAGNOSIS — R002 Palpitations: Secondary | ICD-10-CM

## 2014-10-05 LAB — MDC_IDC_ENUM_SESS_TYPE_REMOTE

## 2014-10-09 ENCOUNTER — Emergency Department (HOSPITAL_COMMUNITY): Payer: Medicare Other

## 2014-10-09 ENCOUNTER — Encounter (HOSPITAL_COMMUNITY): Payer: Self-pay | Admitting: Emergency Medicine

## 2014-10-09 ENCOUNTER — Emergency Department (HOSPITAL_COMMUNITY)
Admission: EM | Admit: 2014-10-09 | Discharge: 2014-10-09 | Disposition: A | Discharge status: Home / Self Care | Payer: Medicare Other | Attending: Emergency Medicine | Admitting: Emergency Medicine

## 2014-10-09 DIAGNOSIS — Z951 Presence of aortocoronary bypass graft: Secondary | ICD-10-CM | POA: Diagnosis not present

## 2014-10-09 DIAGNOSIS — Z7951 Long term (current) use of inhaled steroids: Secondary | ICD-10-CM | POA: Insufficient documentation

## 2014-10-09 DIAGNOSIS — Z8739 Personal history of other diseases of the musculoskeletal system and connective tissue: Secondary | ICD-10-CM | POA: Diagnosis not present

## 2014-10-09 DIAGNOSIS — E785 Hyperlipidemia, unspecified: Secondary | ICD-10-CM | POA: Insufficient documentation

## 2014-10-09 DIAGNOSIS — F329 Major depressive disorder, single episode, unspecified: Secondary | ICD-10-CM | POA: Insufficient documentation

## 2014-10-09 DIAGNOSIS — E0590 Thyrotoxicosis, unspecified without thyrotoxic crisis or storm: Secondary | ICD-10-CM | POA: Insufficient documentation

## 2014-10-09 DIAGNOSIS — Z79899 Other long term (current) drug therapy: Secondary | ICD-10-CM | POA: Insufficient documentation

## 2014-10-09 DIAGNOSIS — J45909 Unspecified asthma, uncomplicated: Secondary | ICD-10-CM | POA: Diagnosis not present

## 2014-10-09 DIAGNOSIS — Z880 Allergy status to penicillin: Secondary | ICD-10-CM | POA: Insufficient documentation

## 2014-10-09 DIAGNOSIS — R079 Chest pain, unspecified: Secondary | ICD-10-CM

## 2014-10-09 DIAGNOSIS — R140 Abdominal distension (gaseous): Secondary | ICD-10-CM | POA: Diagnosis not present

## 2014-10-09 DIAGNOSIS — R1084 Generalized abdominal pain: Secondary | ICD-10-CM | POA: Insufficient documentation

## 2014-10-09 DIAGNOSIS — F410 Panic disorder [episodic paroxysmal anxiety] without agoraphobia: Secondary | ICD-10-CM | POA: Insufficient documentation

## 2014-10-09 DIAGNOSIS — I421 Obstructive hypertrophic cardiomyopathy: Secondary | ICD-10-CM | POA: Diagnosis not present

## 2014-10-09 DIAGNOSIS — Z87828 Personal history of other (healed) physical injury and trauma: Secondary | ICD-10-CM | POA: Insufficient documentation

## 2014-10-09 DIAGNOSIS — I10 Essential (primary) hypertension: Secondary | ICD-10-CM | POA: Insufficient documentation

## 2014-10-09 DIAGNOSIS — Z9851 Tubal ligation status: Secondary | ICD-10-CM | POA: Insufficient documentation

## 2014-10-09 DIAGNOSIS — K219 Gastro-esophageal reflux disease without esophagitis: Secondary | ICD-10-CM | POA: Insufficient documentation

## 2014-10-09 DIAGNOSIS — Z9089 Acquired absence of other organs: Secondary | ICD-10-CM | POA: Diagnosis not present

## 2014-10-09 DIAGNOSIS — R109 Unspecified abdominal pain: Secondary | ICD-10-CM

## 2014-10-09 DIAGNOSIS — E860 Dehydration: Secondary | ICD-10-CM | POA: Diagnosis not present

## 2014-10-09 LAB — PRO B NATRIURETIC PEPTIDE: Pro B Natriuretic peptide (BNP): 78 pg/mL (ref 0–125)

## 2014-10-09 LAB — URINE MICROSCOPIC-ADD ON

## 2014-10-09 LAB — COMPREHENSIVE METABOLIC PANEL
ALT: 20 U/L (ref 0–35)
AST: 50 U/L — ABNORMAL HIGH (ref 0–37)
Albumin: 3.6 g/dL (ref 3.5–5.2)
Alkaline Phosphatase: 105 U/L (ref 39–117)
Anion gap: 12 (ref 5–15)
BUN: 10 mg/dL (ref 6–23)
CO2: 25 mEq/L (ref 19–32)
Calcium: 9 mg/dL (ref 8.4–10.5)
Chloride: 101 mEq/L (ref 96–112)
Creatinine, Ser: 0.75 mg/dL (ref 0.50–1.10)
GFR calc Af Amer: >90 mL/min (ref >90)
GFR calc non Af Amer: 84 mL/min — ABNORMAL LOW (ref >90)
Glucose, Bld: 116 mg/dL — ABNORMAL HIGH (ref 70–99)
Potassium: 5.8 mEq/L — ABNORMAL HIGH (ref 3.7–5.3)
Sodium: 138 mEq/L (ref 137–147)
Total Bilirubin: 0.9 mg/dL (ref 0.3–1.2)
Total Protein: 7.6 g/dL (ref 6.0–8.3)

## 2014-10-09 LAB — CBC WITH DIFFERENTIAL/PLATELET
Basophils Absolute: 0 10*3/uL (ref 0.0–0.1)
Basophils Relative: 1 % (ref 0–1)
Eosinophils Absolute: 0.1 10*3/uL (ref 0.0–0.7)
Eosinophils Relative: 1 % (ref 0–5)
HCT: 41.7 % (ref 36.0–46.0)
Hemoglobin: 13.6 g/dL (ref 12.0–15.0)
Lymphocytes Relative: 37 % (ref 12–46)
Lymphs Abs: 1.7 10*3/uL (ref 0.7–4.0)
MCH: 28.9 pg (ref 26.0–34.0)
MCHC: 32.6 g/dL (ref 30.0–36.0)
MCV: 88.5 fL (ref 78.0–100.0)
Monocytes Absolute: 0.4 10*3/uL (ref 0.1–1.0)
Monocytes Relative: 8 % (ref 3–12)
Neutro Abs: 2.5 10*3/uL (ref 1.7–7.7)
Neutrophils Relative %: 53 % (ref 43–77)
Platelets: 268 10*3/uL (ref 150–400)
RBC: 4.71 MIL/uL (ref 3.87–5.11)
RDW: 13 % (ref 11.5–15.5)
WBC: 4.7 10*3/uL (ref 4.0–10.5)

## 2014-10-09 LAB — URINALYSIS, ROUTINE W REFLEX MICROSCOPIC
Bilirubin Urine: NEGATIVE
Glucose, UA: NEGATIVE mg/dL
Hgb urine dipstick: NEGATIVE
Ketones, ur: NEGATIVE mg/dL
Nitrite: NEGATIVE
Protein, ur: NEGATIVE mg/dL
Specific Gravity, Urine: 1.004 — ABNORMAL LOW (ref 1.005–1.030)
Urobilinogen, UA: 0.2 mg/dL (ref 0.0–1.0)
pH: 5.5 (ref 5.0–8.0)

## 2014-10-09 LAB — I-STAT TROPONIN, ED: Troponin i, poc: 0.02 ng/mL (ref 0.00–0.08)

## 2014-10-09 LAB — PROTIME-INR
INR: 1.02 (ref 0.00–1.49)
Prothrombin Time: 13.5 seconds (ref 11.6–15.2)

## 2014-10-09 LAB — LIPASE, BLOOD: Lipase: 28 U/L (ref 11–59)

## 2014-10-09 LAB — POTASSIUM: Potassium: 4 mEq/L (ref 3.7–5.3)

## 2014-10-09 MED ORDER — SUCRALFATE 1 GM/10ML PO SUSP: 1.0000 g | Freq: Three times a day (TID) | ORAL | Status: DC

## 2014-10-09 MED ORDER — IOHEXOL 300 MG/ML  SOLN
80.0000 mL | Freq: Once | INTRAMUSCULAR | Status: AC | PRN
  Administered 2014-10-09: 80 mL via INTRAVENOUS

## 2014-10-09 MED ORDER — IOHEXOL 300 MG/ML  SOLN
25.0000 mL | INTRAMUSCULAR | Status: AC
  Administered 2014-10-09: 25 mL via ORAL

## 2014-10-09 NOTE — ED Provider Notes (Signed)
Medical screening examination/treatment/procedure(s) were conducted as a shared visit with non-physician practitioner(s) and myself.  I personally evaluated the patient during the encounter.  Please see separate associated note for evaluation and plan.    EKG Interpretation   Date/Time:  Friday October 09 2014 14:13:30 EDT Ventricular Rate:  82 PR Interval:  147 QRS Duration: 135 QT Interval:  413 QTC Calculation: 482 R Axis:   74 Text Interpretation:  Sinus rhythm Left bundle branch block No significant  change since last tracing Confirmed by Jess Toney  MD, Judeth Gilles [phone]) on  10/09/2014 2:16:45 PM       Orpah Greek, MD 10/09/14 [phone]

## 2014-10-09 NOTE — ED Provider Notes (Signed)
Patient presented to the ER with abdominal discomfort. Patient feels like she has increased bloating and pain across her upper abdomen after she eats. Symptoms ongoing for 2 weeks.  Face to face Exam: HEENT - PERRLA Lungs - CTAB Heart - RRR, no M/R/G Abd - S/NT/ND Neuro - alert, oriented x3  Plan: Workup unremarkable including CAT scan. Patient having increased symptoms after eating. She might have gastritis or possibly peptic ulcer disease. Will refer back to her gastroenterologist, Dr. Collene Mares.   Orpah Greek, MD 10/09/14 1540

## 2014-10-09 NOTE — ED Provider Notes (Signed)
CSN: [id number]     Arrival date & time 10/09/14  1250 History   First MD Initiated Contact with Patient 10/09/14 1317     Chief Complaint  Patient presents with  . Abdominal Pain     (Consider location/radiation/quality/duration/timing/severity/associated sxs/prior Treatment) HPI  Patient to the ER with complaints of generalized abdominal pain over the past two weeks. She also reports feeling dehydrated and bloated. She has a PMH of panic attacks, Hypertrophic obstructive cardiomyopathy, GERD, hyperlipidemia, asthma, palpitations, hypertension, depression, hyperthyroidism and CHF.  She reports telling her PCP about her pain a week ago but he wrote her for some medication instead of doing tests. Because of this she decided to switch PCP's. She has not yet seen her new provider. She reports a recent weight gain, her stomach appearing bigger, with pain. She has come for testing.   Past Medical History  Diagnosis Date  . Panic attacks   . 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 GERD (gastroesophageal reflux disease)   . HLD (hyperlipidemia)   . Asthma   . Palpitations   . Near syncope   . Chest wall pain   . Hypertension     at times,  . Mental disorder   . Depression   . Head injury, closed, without LOC 02/2013    did not LOC per patient  . Arthritis   . Hyperthyroidism    Past Surgical History  Procedure Laterality Date  . 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 Tubal ligation    . Cholecystectomy    . Loop recorder implant  03/18/2013  . Septal myectomy    . Nuclear stress test  11/30/2011    No ischemia  . Doppler echocardiography  03/14/2013    EF 65-70%,trivial AI,mild-mod. MR,Mod. TR,mod. concentric hypertrophy  . Knee arthroscopy Right 10/17/2013    Procedure: ARTHROSCOPY RIGHT KNEE;  Surgeon: [name], MD;  Location: [facility];  Service: Orthopedics;  Laterality: Right;  . Esophagogastroduodenoscopy N/A 11/24/2013    Procedure:  ESOPHAGOGASTRODUODENOSCOPY (EGD);  Surgeon: Juanita Craver, MD;  Location: [facility];  Service: Endoscopy;  Laterality: N/A;  . Colonoscopy N/A 11/24/2013    Procedure: COLONOSCOPY;  Surgeon: Juanita Craver, MD;  Location: [facility];  Service: Endoscopy;  Laterality: N/A;   Family History  Problem Relation Age of Onset  . Cancer Mother     breast  . Hypertension Mother   . Cancer Father     prostate  . Diabetes Sister   . Heart attack Sister    History  Substance Use Topics  . Smoking status: Never Smoker   . Smokeless tobacco: Not on file  . Alcohol Use: No   OB History   Grav Para Term Preterm Abortions TAB SAB Ect Mult Living   5 5 5       4      Review of Systems  ROS: No  headaches, no dysphagia.  No prolonged cough. .    No urinary tract symptoms.  No new or unusual musculoskeletal symptoms.  Normal menses, no abnormal vaginal bleeding, discharge or unexpected pelvic pain. No new breast lumps, breast pain or nipple discharge.  + chest pain + abdominal pain without  change in bowel habits, black or bloody stools.  Allergies  Amoxicillin-pot clavulanate and Statins  Home Medications   Prior to Admission medications   Medication Sig Start Date End Date Taking? Authorizing Provider  albuterol (PROVENTIL HFA;VENTOLIN HFA) 108 (90 BASE) MCG/ACT inhaler Inhale 2 puffs into the lungs every  6 (six) hours as needed for wheezing or shortness of breath.   Yes Historical Provider, MD  atenolol (TENORMIN) 25 MG tablet Take 0.5 tablets (12.5 mg total) by mouth daily as needed (for high blood pressure). 07/08/14  Yes Mihai Croitoru, MD  Cholecalciferol (VITAMIN D-3) 5000 UNITS TABS Take 5,000 Units by mouth daily.    Yes Historical Provider, MD  clobetasol cream (TEMOVATE) 9.67 % Apply 1 application topically every other day. 07/27/14  Yes Donnamae Jude, MD  clonazePAM (KLONOPIN) 1 MG tablet Take 0.5-2 mg by mouth 2 (two) times daily. Take 0.5 tablet (0.5 mg) during the day and 2  tablets (2 mg) at bedtime   Yes Historical Provider, MD  ezetimibe-simvastatin (VYTORIN) 10-40 MG per tablet Take 0.5 tablets by mouth at bedtime.    Yes Historical Provider, MD  levothyroxine (SYNTHROID) 50 MCG tablet Take 50 mcg by mouth daily.  06/15/14  Yes Historical Provider, MD  Misc Natural Products (OSTEO BI-FLEX ADV DOUBLE ST) TABS Take 1 tablet by mouth 2 (two) times daily.    Yes Historical Provider, MD  mometasone (ASMANEX) 220 MCG/INH inhaler Inhale 1 puff into the lungs daily as needed (for shortness of breath).    Yes Historical Provider, MD  Multiple Vitamin (MULTIVITAMIN) tablet Take 1 tablet by mouth daily.   Yes Historical Provider, MD  pantoprazole (PROTONIX) 40 MG tablet Take 40 mg by mouth at bedtime.    Yes Historical Provider, MD  Polyethyl Glycol-Propyl Glycol (SYSTANE) 0.4-0.3 % SOLN Apply 1 drop to eye 2 (two) times daily.    Yes Historical Provider, MD  Probiotic Product (PROBIOTIC DAILY PO) Take 1 capsule by mouth daily.   Yes Historical Provider, MD  sucralfate (CARAFATE) 1 GM/10ML suspension Take 10 mLs (1 g total) by mouth 4 (four) times daily -  with meals and at bedtime. 10/09/14   Linus Mako, PA-C   BP 130/77  Pulse 80  Temp(Src) 97.4 F (36.3 C) (Oral)  Resp 17  SpO2 93% Physical Exam  Nursing note and vitals reviewed. Constitutional: She appears well-developed and well-nourished. No distress.  HENT:  Head: Normocephalic and atraumatic.  Eyes: Pupils are equal, round, and reactive to light.  Neck: Normal range of motion. Neck supple.  Cardiovascular: Normal rate and regular rhythm.   Pulmonary/Chest: Effort normal.  Abdominal: Soft. Bowel sounds are normal. She exhibits distension (mild). She exhibits no fluid wave and no ascites. There is no hepatosplenomegaly. There is tenderness (diffuse and mild). There is no rigidity, no rebound, no guarding and no CVA tenderness. No hernia.  Exam limited by abdominal wall habitus  Neurological: She is alert.   Skin: Skin is warm and dry.  Psychiatric: Her mood appears anxious.    ED Course  Procedures (including critical care time) Labs Review Labs Reviewed  COMPREHENSIVE METABOLIC PANEL - Abnormal; Notable for the following:    Potassium 5.8 (*)    Glucose, Bld 116 (*)    AST 50 (*)    GFR calc non Af Amer 84 (*)    All other components within normal limits  URINALYSIS, ROUTINE W REFLEX MICROSCOPIC - Abnormal; Notable for the following:    Specific Gravity, Urine 1.004 (*)    Leukocytes, UA TRACE (*)    All other components within normal limits  URINE CULTURE  CBC WITH DIFFERENTIAL  LIPASE, BLOOD  PROTIME-INR  PRO B NATRIURETIC PEPTIDE  POTASSIUM  URINE MICROSCOPIC-ADD ON  Randolm Idol, ED    Imaging Review Dg Chest 2  View  10/09/2014   CLINICAL DATA:  68 year old female with acute mid and lower chest pain today. Initial encounter.  EXAM: CHEST  2 VIEW  COMPARISON:  05/10/2014 and earlier.  FINDINGS: Sequelae of median sternotomy. Chronic cardiac event recorder. Stable cholecystectomy clips. Stable lung volumes. Normal cardiac size and mediastinal contours. Visualized tracheal air column is within normal limits. No pneumothorax, pulmonary edema, pleural effusion or confluent pulmonary opacity. No acute osseous abnormality identified.  IMPRESSION: No acute cardiopulmonary abnormality.   Electronically Signed   By: Lars Pinks M.D.   On: 10/09/2014 14:29   Ct Abdomen Pelvis W Contrast  10/09/2014   CLINICAL DATA:  68 year old female with generalized abdominal pain for 2 weeks, dehydration. Initial encounter.  EXAM: CT ABDOMEN AND PELVIS WITH CONTRAST  TECHNIQUE: Multidetector CT imaging of the abdomen and pelvis was performed using the standard protocol following bolus administration of intravenous contrast.  CONTRAST:  72mL OMNIPAQUE IOHEXOL 300 MG/ML  SOLN  COMPARISON:  CT Abdomen and Pelvis 10/03/2010.  FINDINGS: Sequela of median sternotomy. Implanted cardiac event recorder.  Stable cardiomegaly. No pericardial or pleural effusion. Minor dependent atelectasis at the lung bases.  No acute osseous abnormality identified.  No pelvic free fluid. Pelvic phleboliths. Negative uterus and adnexa. Mildly redundant but otherwise negative distal colon. Left colon, transverse colon, right colon (decompressed), and appendix are normal. Normal terminal ileum. No dilated small bowel. Negative stomach and duodenum.  Surgically absent gallbladder as before. Liver density at the lower limits of normal. Spleen, pancreas and adrenal glands within normal limits. Portal venous system is patent. Major arterial structures in the abdomen and pelvis are within normal limits. No abdominal free fluid. Renal enhancement and contrast excretion within normal limits. No lymphadenopathy identified.  IMPRESSION: No acute or inflammatory process identified in the abdomen or pelvis.   Electronically Signed   By: Lars Pinks M.D.   On: 10/09/2014 15:16     EKG Interpretation   Date/Time:  Friday October 09 2014 14:13:30 EDT Ventricular Rate:  82 PR Interval:  147 QRS Duration: 135 QT Interval:  413 QTC Calculation: 482 R Axis:   74 Text Interpretation:  Sinus rhythm Left bundle branch block No significant  change since last tracing Confirmed by POLLINA  MD, CHRISTOPHER [phone]) on  10/09/2014 2:16:45 PM      MDM   Final diagnoses:  Chest pain  Abdominal pain  Bloating    Dr. Betsey Holiday has seen patient as well. She has had no active chest pain today. Her Urinalysis shows trace leukocyts but she has no urinary symptoms, culture added on- will not treat at this time.  Pt/inr, BNP, CBC, lipase, are WNL. CMP originally showed elevated potassium but on repeat it was WNL. Troponin is negative/ Chest xray unremarkable and EKG is not changed from baseline.  Dr. Betsey Holiday reports pt seeing Dr. Collene Mares for a long time and her being told that she can not help her. Will refer back to Dr. Collene Mares. Will give Carafate and  pain medications for home.   68 y.o.Raneem L Lozinski's evaluation in the Emergency Department is complete. It has been determined that no acute conditions requiring further emergency intervention are present at this time. The patient/guardian have been advised of the diagnosis and plan. We have discussed signs and symptoms that warrant return to the ED, such as changes or worsening in symptoms.  Vital signs are stable at discharge. Filed Vitals:   10/09/14 1445  BP: 130/77  Pulse: 80  Temp:  Resp: 17    Patient/guardian has voiced understanding and agreed to follow-up with the PCP or specialist.     Linus Mako, PA-C 10/09/14 1548

## 2014-10-09 NOTE — Discharge Instructions (Signed)
Abdominal Pain °Many things can cause abdominal pain. Usually, abdominal pain is not caused by a disease and will improve without treatment. It can often be observed and treated at home. Your health care provider will do a physical exam and possibly order blood tests and X-rays to help determine the seriousness of your pain. However, in many cases, more time must pass before a clear cause of the pain can be found. Before that point, your health care provider may not know if you need more testing or further treatment. °HOME CARE INSTRUCTIONS  °Monitor your abdominal pain for any changes. The following actions may help to alleviate any discomfort you are experiencing: °· Only take over-the-counter or prescription medicines as directed by your health care provider. °· Do not take laxatives unless directed to do so by your health care provider. °· Try a clear liquid diet (broth, tea, or water) as directed by your health care provider. Slowly move to a bland diet as tolerated. °SEEK MEDICAL CARE IF: °· You have unexplained abdominal pain. °· You have abdominal pain associated with nausea or diarrhea. °· You have pain when you urinate or have a bowel movement. °· You experience abdominal pain that wakes you in the night. °· You have abdominal pain that is worsened or improved by eating food. °· You have abdominal pain that is worsened with eating fatty foods. °· You have a fever. °SEEK IMMEDIATE MEDICAL CARE IF:  °· Your pain does not go away within 2 hours. °· You keep throwing up (vomiting). °· Your pain is felt only in portions of the abdomen, such as the right side or the left lower portion of the abdomen. °· You pass bloody or black tarry stools. °MAKE SURE YOU: °· Understand these instructions.   °· Will watch your condition.   °· Will get help right away if you are not doing well or get worse.   °Document Released: 09/06/2005 Document Revised: 12/02/2013 Document Reviewed: 08/06/2013 °ExitCare® Patient Information  ©2015 [employer]. This information is not intended to replace advice given to you by your health care provider. Make sure you discuss any questions you have with your health care provider. ° °Bloating °Bloating is the feeling of fullness in your belly. You may feel as though your pants are too tight. Often the cause of bloating is overeating, retaining fluids, or having gas in your bowel. It is also caused by swallowing air and eating foods that cause gas. Irritable bowel syndrome is one of the most common causes of bloating. Constipation is also a common cause. Sometimes more serious problems can cause bloating. °SYMPTOMS  °Usually there is a feeling of fullness, as though your abdomen is bulged out. There may be mild discomfort.  °DIAGNOSIS  °Usually no particular testing is necessary for most bloating. If the condition persists and seems to become worse, your caregiver may do additional testing.  °TREATMENT  °· There is no direct treatment for bloating. °· Do not put gas into the bowel. Avoid chewing gum and sucking on candy. These tend to make you swallow air. Swallowing air can also be a nervous habit. Try to avoid this. °· Avoiding high residue diets will help. Eat foods with soluble fibers (examples include root vegetables, apples, or barley) and substitute dairy products with soy and rice products. This helps irritable bowel syndrome. °· If constipation is the cause, then a high residue diet with more fiber will help. °· Avoid carbonated beverages. °· Over-the-counter preparations are available that help reduce gas. Your   pharmacist can help you with this. °SEEK MEDICAL CARE IF:  °· Bloating continues and seems to be getting worse. °· You notice a weight gain. °· You have a weight loss but the bloating is getting worse. °· You have changes in your bowel habits or develop nausea or vomiting. °SEEK IMMEDIATE MEDICAL CARE IF:  °· You develop shortness of breath or swelling in your legs. °· You have an  increase in abdominal pain or develop chest pain. °Document Released: 09/27/2006 Document Revised: 02/19/2012 Document Reviewed: 11/15/2007 °ExitCare® Patient Information ©2015 [employer]. This information is not intended to replace advice given to you by your health care provider. Make sure you discuss any questions you have with your health care provider. ° °

## 2014-10-09 NOTE — ED Notes (Signed)
Pt reports having generalized abd pain x 2 weeks, feeling weak and dehydrated. Pain gets worse after she eats and pt feels bloated.

## 2014-10-09 NOTE — ED Notes (Signed)
Pt finished drinking contrast, CT Aware.

## 2014-10-09 NOTE — Progress Notes (Signed)
Loop recorder 

## 2014-10-10 LAB — URINE CULTURE
Colony Count: 15000
Special Requests: NORMAL

## 2014-10-12 ENCOUNTER — Encounter (HOSPITAL_COMMUNITY): Payer: Self-pay | Admitting: Emergency Medicine

## 2014-10-29 ENCOUNTER — Ambulatory Visit (INDEPENDENT_AMBULATORY_CARE_PROVIDER_SITE_OTHER): Payer: Medicare Other | Admitting: Family Medicine

## 2014-10-29 ENCOUNTER — Encounter: Payer: Self-pay | Admitting: Family Medicine

## 2014-10-29 VITALS — BP 119/73 | HR 84 | Temp 97.5°F | Ht 60.0 in | Wt 131.1 lb

## 2014-10-29 DIAGNOSIS — L900 Lichen sclerosus et atrophicus: Secondary | ICD-10-CM

## 2014-10-29 NOTE — Assessment & Plan Note (Signed)
Doing well follow up in 6 months.

## 2014-10-29 NOTE — Progress Notes (Signed)
    Subjective:    Patient ID: Autumn Fitzgerald is a 68 y.o. female presenting with Follow-up  on 10/29/2014  HPI: Reports she is having some stomach pain and is undergoing an EGD. Has temovate qod.  Review of Systems  Constitutional: Positive for fever. Negative for chills.  Respiratory: Negative for shortness of breath.   Cardiovascular: Negative for chest pain.  Gastrointestinal: Positive for nausea and abdominal pain. Negative for vomiting.  Genitourinary: Negative for dysuria.  Skin: Negative for rash.      Objective:    BP 119/73 mmHg  Pulse 84  Temp(Src) 97.5 F (36.4 C) (Oral)  Ht 5' (1.524 m)  Wt 131 lb 1.6 oz (59.467 kg)  BMI 25.60 kg/m2 Physical Exam  Constitutional: She is oriented to person, place, and time. She appears well-developed and well-nourished. No distress.  HENT:  Head: Normocephalic and atraumatic.  Eyes: No scleral icterus.  Neck: Neck supple.  Cardiovascular: Normal rate.   Pulmonary/Chest: Effort normal.  Abdominal: Soft.  Genitourinary:  White plaque noted peri-urethrally and at posterior fourchette only--slow improvement.  Neurological: She is alert and oriented to person, place, and time.  Skin: Skin is warm and dry.  Psychiatric: She has a normal mood and affect.        Assessment & Plan:   Problem List Items Addressed This Visit      Unprioritized   Lichen sclerosus et atrophicus - Primary    Doing well follow up in 6 months.     advised of appropriate place to apply temovate. May go to 2x/wk.  Return in about 6 months (around 04/29/2015).

## 2014-10-29 NOTE — Patient Instructions (Signed)
Lichen Sclerosus Lichen sclerosus is a skin problem. It can happen on any part of the body, but it commonly involves the anal or genital areas. Lichen sclerosus is not an infection or a fungus. Girls and women are more commonly affected than boys and men. CAUSES The cause is not known. It could be the result of an overactive immune system or a lack of certain hormones. Lichen sclerosus is not passed from one person to another (not contagious). SYMPTOMS Your skin may have:  Thin, wrinkled, white areas.  Thickened white areas.  Red and swollen patches.  Tears or cracks.  Bruising.  Blood blisters.  Severe itching. You may also have pain, itching, or burning with urination. Constipation is also common in people with lichen sclerosus. DIAGNOSIS Your caregiver will do a physical exam. Sometimes, a tissue sample (biopsy) may be sent for testing. TREATMENT Treatment may involve putting a thin layer of medicated cream (topical steroid) over the areas with lichen sclerosus. Use the cream only as directed by your caregiver.  HOME CARE INSTRUCTIONS  Only take over-the-counter or prescription medicines as directed by your caregiver.  Keep the vaginal area as clean and dry as possible. SEEK MEDICAL CARE IF: You develop increasing pain, swelling, or redness. Document Released: 04/19/2011 Document Revised: 02/19/2012 Document Reviewed: 04/19/2011 ExitCare Patient Information 2015 [employer]. This information is not intended to replace advice given to you by your health care provider. Make sure you discuss any questions you have with your health care provider.  

## 2014-10-30 DIAGNOSIS — R1013 Epigastric pain: Secondary | ICD-10-CM | POA: Diagnosis not present

## 2014-11-03 ENCOUNTER — Ambulatory Visit (INDEPENDENT_AMBULATORY_CARE_PROVIDER_SITE_OTHER): Payer: Medicare Other | Admitting: *Deleted

## 2014-11-03 DIAGNOSIS — R002 Palpitations: Secondary | ICD-10-CM

## 2014-11-11 NOTE — Progress Notes (Signed)
Loop recorder 

## 2014-11-17 ENCOUNTER — Encounter: Payer: Self-pay | Admitting: Cardiovascular Disease

## 2014-11-19 ENCOUNTER — Encounter (HOSPITAL_COMMUNITY): Payer: Self-pay | Admitting: Cardiovascular Disease

## 2014-11-23 DIAGNOSIS — K219 Gastro-esophageal reflux disease without esophagitis: Secondary | ICD-10-CM | POA: Diagnosis not present

## 2014-11-23 DIAGNOSIS — J4520 Mild intermittent asthma, uncomplicated: Secondary | ICD-10-CM | POA: Diagnosis not present

## 2014-11-23 DIAGNOSIS — R7301 Impaired fasting glucose: Secondary | ICD-10-CM | POA: Diagnosis not present

## 2014-11-24 LAB — MDC_IDC_ENUM_SESS_TYPE_REMOTE
Date Time Interrogation Session: 20151125235415
Zone Setting Detection Interval: 2000 ms
Zone Setting Detection Interval: 3000 ms
Zone Setting Detection Interval: 370 ms

## 2014-12-03 ENCOUNTER — Ambulatory Visit (INDEPENDENT_AMBULATORY_CARE_PROVIDER_SITE_OTHER): Payer: Medicare Other | Admitting: *Deleted

## 2014-12-03 DIAGNOSIS — R002 Palpitations: Secondary | ICD-10-CM

## 2014-12-09 NOTE — Progress Notes (Signed)
Loop recorder 

## 2014-12-15 ENCOUNTER — Encounter: Payer: Self-pay | Admitting: Cardiovascular Disease

## 2014-12-21 ENCOUNTER — Encounter: Payer: Self-pay | Admitting: Cardiovascular Disease

## 2014-12-21 ENCOUNTER — Ambulatory Visit (INDEPENDENT_AMBULATORY_CARE_PROVIDER_SITE_OTHER): Payer: Medicare Other | Admitting: Cardiovascular Disease

## 2014-12-21 VITALS — BP 106/68 | HR 76 | Resp 16 | Ht 60.0 in | Wt 134.3 lb

## 2014-12-21 DIAGNOSIS — R0602 Shortness of breath: Secondary | ICD-10-CM

## 2014-12-21 DIAGNOSIS — R002 Palpitations: Secondary | ICD-10-CM

## 2014-12-21 LAB — MDC_IDC_ENUM_SESS_TYPE_INCLINIC
Date Time Interrogation Session: 20160111195334
Zone Setting Detection Interval: 2000 ms
Zone Setting Detection Interval: 3000 ms
Zone Setting Detection Interval: 370 ms

## 2014-12-21 MED ORDER — ATENOLOL 25 MG PO TABS: 12.5000 mg | ORAL_TABLET | Freq: Every day | ORAL | Status: DC | PRN

## 2014-12-21 NOTE — Patient Instructions (Signed)
Your physician has requested that you have an echocardiogram. Echocardiography is a painless test that uses sound waves to create images of your heart. It provides your doctor with information about the size and shape of your heart and how well your heart's chambers and valves are working. This procedure takes approximately one hour. There are no restrictions for this procedure.  Dr. Sallyanne Kuster recommends that you schedule a follow-up appointment in: 6-12 months.

## 2014-12-22 ENCOUNTER — Encounter: Payer: Self-pay | Admitting: Cardiovascular Disease

## 2014-12-22 LAB — MDC_IDC_ENUM_SESS_TYPE_REMOTE

## 2014-12-22 NOTE — Progress Notes (Signed)
Patient ID: Autumn Fitzgerald, female   DOB: 09-23-1946, 69 y.o.   MRN: [id number]     Reason for office visit HOCM s/p septal myectomy, history of syncope, loop recorder check  She stopped having syncopal events after the loop recorder was implanted. She describes gradually worsening, but still mild exertional dyspnea. Dr. Collene Mares removed "14 gastric polyps".  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 She has a chronic left bundle branch block. A couple of years ago she had a few near-syncope events as well as palpitations and received a loop recorder. This has not shown evidence of atrioventricular block or ventricular tachycardia. Negative for arrhythmia on today's  Check. She's not had any further episodes of near syncope or syncope. Her most recent echocardiogram performed in 2014 showed no evidence of left ventricular outflow obstruction. She has moderate LVH, mild to moderate mitral insufficiency with a centrally directed jet and moderate tricuspid regurgitation. EF was 65-70%. Allergies  Allergen Reactions  . Amoxicillin-Pot Clavulanate   . Statins Other (See Comments)    Myalgias with lipitor; tolerates vytorin    Current Outpatient Prescriptions  Medication Sig Dispense Refill  . albuterol (PROVENTIL HFA;VENTOLIN HFA) 108 (90 BASE) MCG/ACT inhaler Inhale 2 puffs into the lungs every 6 (six) hours as needed for wheezing or shortness of breath.    Marland Kitchen aspirin 81 MG tablet Take 81 mg by mouth daily.    Marland Kitchen atenolol (TENORMIN) 25 MG tablet Take 0.5 tablets (12.5 mg total) by mouth daily as needed (for high blood pressure). 45 tablet 3  . Cholecalciferol (VITAMIN D-3) 5000 UNITS TABS Take 5,000 Units by mouth daily.     . clobetasol cream (TEMOVATE) 6.83 % Apply 1 application topically every other day. 30 g 2  . clonazePAM (KLONOPIN) 1 MG tablet Take 0.5-2 mg by mouth 2 (two) times daily. Take 0.5 tablet (0.5 mg) during the  day and 2 tablets (2 mg) at bedtime    . ezetimibe-simvastatin (VYTORIN) 10-40 MG per tablet Take 0.5 tablets by mouth at bedtime.     Marland Kitchen levothyroxine (SYNTHROID) 50 MCG tablet Take 50 mcg by mouth daily.     . Misc Natural Products (OSTEO BI-FLEX ADV DOUBLE ST) TABS Take 1 tablet by mouth 2 (two) times daily.     . mometasone (ASMANEX) 220 MCG/INH inhaler Inhale 1 puff into the lungs daily as needed (for shortness of breath).     . Multiple Vitamin (MULTIVITAMIN) tablet Take 1 tablet by mouth daily.    . pantoprazole (PROTONIX) 40 MG tablet Take 40 mg by mouth at bedtime.     Vladimir Faster Glycol-Propyl Glycol (SYSTANE) 0.4-0.3 % SOLN Apply 1 drop to eye 2 (two) times daily.     . Probiotic Product (PROBIOTIC DAILY PO) Take 1 capsule by mouth daily.     No current facility-administered medications for this visit.    Past Medical History  Diagnosis Date  . Panic attacks   . 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 GERD (gastroesophageal reflux disease)   . HLD (hyperlipidemia)   . Asthma   . Palpitations   . Near syncope   . Chest wall pain   . Hypertension     at times,  . Mental disorder   . Depression   . Head injury, closed, without LOC 02/2013    did not LOC per patient  . Arthritis   . Hyperthyroidism     Past Surgical History  Procedure Laterality Date  . 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 Tubal ligation    . Cholecystectomy    . Loop recorder implant  03/18/2013  . Septal myectomy    . Nuclear stress test  11/30/2011    No ischemia  . Doppler echocardiography  03/14/2013    EF 65-70%,trivial AI,mild-mod. MR,Mod. TR,mod. concentric hypertrophy  . Knee arthroscopy Right 10/17/2013    Procedure: ARTHROSCOPY RIGHT KNEE;  Surgeon: [name], MD;  Location: [facility];  Service: Orthopedics;  Laterality: Right;  . Esophagogastroduodenoscopy N/A 11/24/2013    Procedure: ESOPHAGOGASTRODUODENOSCOPY (EGD);  Surgeon: Juanita Craver, MD;  Location: [facility];   Service: Endoscopy;  Laterality: N/A;  . Colonoscopy N/A 11/24/2013    Procedure: COLONOSCOPY;  Surgeon: Juanita Craver, MD;  Location: [facility];  Service: Endoscopy;  Laterality: N/A;  . Loop recorder implant N/A 03/18/2013    Procedure: LOOP RECORDER IMPLANT;  Surgeon: Sanda Klein, MD;  Location: [facility];  Service: Cardiovascular;  Laterality: N/A;    Family History  Problem Relation Age of Onset  . Cancer Mother     breast  . Hypertension Mother   . Cancer Father     prostate  . Diabetes Sister   . Heart attack Sister     History   Social History  . Marital Status: Divorced    Spouse Name: N/A    Number of Children: N/A  . Years of Education: N/A   Occupational History  . Not on file.   Social History Main Topics  . Smoking status: Never Smoker   . Smokeless tobacco: Not on file  . Alcohol Use: No  . Drug Use: No  . Sexual Activity: No     Comment: divorced   Other Topics Concern  . Not on file   Social History Narrative    Review of systems: The patient specifically denies any chest pain at rest or with exertion, dyspnea at rest, orthopnea, paroxysmal nocturnal dyspnea, syncope, palpitations, focal neurological deficits, intermittent claudication, lower extremity edema, unexplained weight gain, cough, hemoptysis or wheezing.  The patient also denies abdominal pain, nausea, vomiting, dysphagia, diarrhea, constipation, polyuria, polydipsia, dysuria, hematuria, frequency, urgency, abnormal bleeding or bruising, fever, chills, unexpected weight changes,  change in skin or hair texture, change in voice quality, auditory or visual problems, allergic reactions or rashes, new musculoskeletal complaints other than usual "aches and pains".  Has a lot of problems with anxiety and depression.   PHYSICAL EXAM BP 106/68 mmHg  Pulse 76  Resp 16  Ht 5' (1.524 m)  Wt 134 lb 4.8 oz (60.918 kg)  BMI 26.23 kg/m2  General: Alert, oriented x3, no distress Head: no  evidence of trauma, PERRL, EOMI, no exophtalmos or lid lag, no myxedema, no xanthelasma; normal ears, nose and oropharynx Neck: normal jugular venous pulsations and no hepatojugular reflux; brisk carotid pulses without delay and no carotid bruits Chest: clear to auscultation, no signs of consolidation by percussion or palpation, normal fremitus, symmetrical and full respiratory excursions, healthy sternotomy scar and loop recorder site. Cardiovascular: normal position and quality of the apical impulse, regular rhythm, normal first and second heart sounds, no murmurs, rubs or gallops Abdomen: no tenderness or distention, no masses by palpation, no abnormal pulsatility or arterial bruits, normal bowel sounds, no hepatosplenomegaly Extremities: no clubbing, cyanosis or edema; 2+ radial, ulnar and brachial pulses bilaterally; 2+ right femoral, posterior tibial and dorsalis pedis pulses; 2+ left femoral, posterior tibial and dorsalis pedis pulses; no  subclavian or femoral bruits Neurological: grossly nonfocal   EKG: NSR  Lipid Panel     Component Value Date/Time   CHOL 145 09/23/2008 2026   TRIG 94 09/23/2008 2026   HDL 53 09/23/2008 2026   CHOLHDL 2.7 Ratio 09/23/2008 2026   VLDL 19 09/23/2008 2026   LDLCALC 73 09/23/2008 2026    BMET    Component Value Date/Time   NA 138 10/09/2014 1300   K 4.0 10/09/2014 1345   CL 101 10/09/2014 1300   CO2 25 10/09/2014 1300   GLUCOSE 116* 10/09/2014 1300   BUN 10 10/09/2014 1300   CREATININE 0.75 10/09/2014 1300   CALCIUM 9.0 10/09/2014 1300   GFRNONAA 84* 10/09/2014 1300   GFRAA >90 10/09/2014 1300     ASSESSMENT AND PLAN  No evidence of atrial or ventricular arrhythmia and no syncope. Dyspnea on exertion may be result of diastolic heart failure due to HCM. No evidence of LVOT obstruction by exam or past echo. Reevaluate diastolic function, filling pressures by echo. No overt hypervolemia by exam.  Orders Placed This Encounter  Procedures   . 2D Echocardiogram without contrast   Meds ordered this encounter  Medications  . atenolol (TENORMIN) 25 MG tablet    Sig: Take 0.5 tablets (12.5 mg total) by mouth daily as needed (for high blood pressure).    Dispense:  45 tablet    Refill:  [place] Jazzmon Prindle, MD, [facility] [phone] office [phone] pager

## 2014-12-28 ENCOUNTER — Ambulatory Visit (HOSPITAL_COMMUNITY)
Admission: RE | Admit: 2014-12-28 | Discharge: 2014-12-28 | Disposition: A | Discharge status: Home / Self Care | Payer: Medicare Other | Source: Ambulatory Visit | Attending: Cardiology | Admitting: Cardiology

## 2014-12-28 DIAGNOSIS — R0602 Shortness of breath: Secondary | ICD-10-CM

## 2014-12-28 DIAGNOSIS — I369 Nonrheumatic tricuspid valve disorder, unspecified: Secondary | ICD-10-CM | POA: Diagnosis not present

## 2014-12-28 NOTE — Progress Notes (Signed)
2D Echo Performed 12/28/2014    Marygrace Drought, RCS

## 2014-12-30 ENCOUNTER — Encounter: Payer: Self-pay | Admitting: Cardiovascular Disease

## 2015-01-01 ENCOUNTER — Ambulatory Visit (INDEPENDENT_AMBULATORY_CARE_PROVIDER_SITE_OTHER): Payer: Medicare Other | Admitting: *Deleted

## 2015-01-01 DIAGNOSIS — R002 Palpitations: Secondary | ICD-10-CM

## 2015-01-04 DIAGNOSIS — F328 Other depressive episodes: Secondary | ICD-10-CM | POA: Diagnosis not present

## 2015-01-07 NOTE — Progress Notes (Signed)
Loop recorder 

## 2015-01-12 ENCOUNTER — Encounter: Payer: Self-pay | Admitting: Cardiovascular Disease

## 2015-01-21 LAB — MDC_IDC_ENUM_SESS_TYPE_REMOTE

## 2015-01-29 ENCOUNTER — Encounter: Payer: Self-pay | Admitting: Cardiovascular Disease

## 2015-02-01 ENCOUNTER — Ambulatory Visit (INDEPENDENT_AMBULATORY_CARE_PROVIDER_SITE_OTHER): Payer: Medicare Other | Admitting: *Deleted

## 2015-02-01 DIAGNOSIS — J4520 Mild intermittent asthma, uncomplicated: Secondary | ICD-10-CM | POA: Diagnosis not present

## 2015-02-01 DIAGNOSIS — R7301 Impaired fasting glucose: Secondary | ICD-10-CM | POA: Diagnosis not present

## 2015-02-01 DIAGNOSIS — F328 Other depressive episodes: Secondary | ICD-10-CM | POA: Diagnosis not present

## 2015-02-01 DIAGNOSIS — R002 Palpitations: Secondary | ICD-10-CM

## 2015-02-02 NOTE — Progress Notes (Signed)
Loop recorder 

## 2015-02-17 LAB — MDC_IDC_ENUM_SESS_TYPE_REMOTE

## 2015-02-23 DIAGNOSIS — J45901 Unspecified asthma with (acute) exacerbation: Secondary | ICD-10-CM | POA: Diagnosis not present

## 2015-02-27 ENCOUNTER — Emergency Department (INDEPENDENT_AMBULATORY_CARE_PROVIDER_SITE_OTHER)
Admission: EM | Admit: 2015-02-27 | Discharge: 2015-02-27 | Disposition: A | Discharge status: Home / Self Care | Payer: Medicare Other | Source: Home / Self Care | Attending: Emergency Medicine | Admitting: Emergency Medicine

## 2015-02-27 ENCOUNTER — Encounter (HOSPITAL_COMMUNITY): Payer: Self-pay | Admitting: Emergency Medicine

## 2015-02-27 DIAGNOSIS — J0101 Acute recurrent maxillary sinusitis: Secondary | ICD-10-CM

## 2015-02-27 DIAGNOSIS — J069 Acute upper respiratory infection, unspecified: Secondary | ICD-10-CM | POA: Diagnosis not present

## 2015-02-27 MED ORDER — HYDROCODONE-ACETAMINOPHEN 7.5-325 MG/15ML PO SOLN
10.0000 mL | Freq: Four times a day (QID) | ORAL | Status: DC | PRN
Start: 2015-02-27 — End: 2015-05-03

## 2015-02-27 MED ORDER — DOXYCYCLINE HYCLATE 100 MG PO CAPS: 100.0000 mg | ORAL_CAPSULE | Freq: Two times a day (BID) | ORAL | Status: DC

## 2015-02-27 MED ORDER — PREDNISONE 10 MG PO TABS: ORAL_TABLET | ORAL | Status: DC

## 2015-02-27 NOTE — ED Provider Notes (Signed)
CSN: [id number]     Arrival date & time 02/27/15  1441 History   First MD Initiated Contact with Patient 02/27/15 1554     Chief Complaint  Patient presents with  . Cough   (Consider location/radiation/quality/duration/timing/severity/associated sxs/prior Treatment) HPI            69 year old female presents complaining of a sinus infection. She reports a history of frequent sinus infections. She started getting sick 1 week ago with some congestion and sinus pressure and it has worsened up to today. She went to her allergist who treated her with a nasal spray and a steroid injection but she has worsened since. Fever, chills, NVD. She is coughing a lot and it is making her chest hurt now with coughing. No shortness of breath or chest pain apart from coughing.  Past Medical History  Diagnosis Date  . Panic attacks   . 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 GERD (gastroesophageal reflux disease)   . HLD (hyperlipidemia)   . Asthma   . Palpitations   . Near syncope   . Chest wall pain   . Hypertension     at times,  . Mental disorder   . Depression   . Head injury, closed, without LOC 02/2013    did not LOC per patient  . Arthritis   . Hyperthyroidism    Past Surgical History  Procedure Laterality Date  . 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 Tubal ligation    . Cholecystectomy    . Loop recorder implant  03/18/2013  . Septal myectomy    . Nuclear stress test  11/30/2011    No ischemia  . Doppler echocardiography  03/14/2013    EF 65-70%,trivial AI,mild-mod. MR,Mod. TR,mod. concentric hypertrophy  . Knee arthroscopy Right 10/17/2013    Procedure: ARTHROSCOPY RIGHT KNEE;  Surgeon: [name], MD;  Location: [facility];  Service: Orthopedics;  Laterality: Right;  . Esophagogastroduodenoscopy N/A 11/24/2013    Procedure: ESOPHAGOGASTRODUODENOSCOPY (EGD);  Surgeon: Juanita Craver, MD;  Location: [facility];  Service: Endoscopy;  Laterality: N/A;  . Colonoscopy N/A  11/24/2013    Procedure: COLONOSCOPY;  Surgeon: Juanita Craver, MD;  Location: [facility];  Service: Endoscopy;  Laterality: N/A;  . Loop recorder implant N/A 03/18/2013    Procedure: LOOP RECORDER IMPLANT;  Surgeon: Sanda Klein, MD;  Location: [facility];  Service: Cardiovascular;  Laterality: N/A;   Family History  Problem Relation Age of Onset  . Cancer Mother     breast  . Hypertension Mother   . Cancer Father     prostate  . Diabetes Sister   . Heart attack Sister    History  Substance Use Topics  . Smoking status: Never Smoker   . Smokeless tobacco: Not on file  . Alcohol Use: No   OB History    Gravida Para Term Preterm AB TAB SAB Ectopic Multiple Living   5 5 5       4      Review of Systems  Constitutional: Negative for fever and chills.  HENT: Positive for congestion, sinus pressure and sore throat. Negative for ear pain.   Respiratory: Positive for cough. Negative for chest tightness and shortness of breath.   Cardiovascular: Negative for chest pain.  Gastrointestinal: Negative for nausea, vomiting, abdominal pain and diarrhea.  All other systems reviewed and are negative.   Allergies  Amoxicillin-pot clavulanate and Statins  Home Medications   Prior to Admission medications   Medication Sig Start Date  End Date Taking? Authorizing Provider  albuterol (PROVENTIL HFA;VENTOLIN HFA) 108 (90 BASE) MCG/ACT inhaler Inhale 2 puffs into the lungs every 6 (six) hours as needed for wheezing or shortness of breath.    Historical Provider, MD  aspirin 81 MG tablet Take 81 mg by mouth daily.    Historical Provider, MD  atenolol (TENORMIN) 25 MG tablet Take 0.5 tablets (12.5 mg total) by mouth daily as needed (for high blood pressure). 12/21/14   Mihai Croitoru, MD  Cholecalciferol (VITAMIN D-3) 5000 UNITS TABS Take 5,000 Units by mouth daily.     Historical Provider, MD  clobetasol cream (TEMOVATE) 4.27 % Apply 1 application topically every other day. 07/27/14   Donnamae Jude, MD  clonazePAM (KLONOPIN) 1 MG tablet Take 0.5-2 mg by mouth 2 (two) times daily. Take 0.5 tablet (0.5 mg) during the day and 2 tablets (2 mg) at bedtime    Historical Provider, MD  doxycycline (VIBRAMYCIN) 100 MG capsule Take 1 capsule (100 mg total) by mouth 2 (two) times daily. 02/27/15   Freeman Caldron Sutton Plake, PA-C  ezetimibe-simvastatin (VYTORIN) 10-40 MG per tablet Take 0.5 tablets by mouth at bedtime.     Historical Provider, MD  HYDROcodone-acetaminophen (HYCET) 7.5-325 mg/15 ml solution Take 10 mLs by mouth every 6 (six) hours as needed (for cough). 02/27/15 02/27/16  Liam Graham, PA-C  levothyroxine (SYNTHROID) 50 MCG tablet Take 50 mcg by mouth daily.  06/15/14   Historical Provider, MD  Misc Natural Products (OSTEO BI-FLEX ADV DOUBLE ST) TABS Take 1 tablet by mouth 2 (two) times daily.     Historical Provider, MD  mometasone [facility]) 220 MCG/INH inhaler Inhale 1 puff into the lungs daily as needed (for shortness of breath).     Historical Provider, MD  Multiple Vitamin (MULTIVITAMIN) tablet Take 1 tablet by mouth daily.    Historical Provider, MD  pantoprazole (PROTONIX) 40 MG tablet Take 40 mg by mouth at bedtime.     Historical Provider, MD  Polyethyl Glycol-Propyl Glycol (SYSTANE) 0.4-0.3 % SOLN Apply 1 drop to eye 2 (two) times daily.     Historical Provider, MD  predniSONE (DELTASONE) 10 MG tablet 4 tabs PO QD for 4 days; 3 tabs PO QD for 3 days; 2 tabs PO QD for 2 days; 1 tab PO QD for 1 day 02/27/15   Liam Graham, PA-C  Probiotic Product (PROBIOTIC DAILY PO) Take 1 capsule by mouth daily.    Historical Provider, MD   BP 140/89 mmHg  Pulse 86  Temp(Src) 97.8 F (36.6 C) (Oral)  SpO2 98% Physical Exam  Constitutional: She is oriented to person, place, and time. Vital signs are normal. She appears well-developed and well-nourished. No distress.  HENT:  Head: Normocephalic and atraumatic.  Right Ear: Tympanic membrane, external ear and ear canal normal.  Left Ear: Tympanic  membrane, external ear and ear canal normal.  Nose: Right sinus exhibits maxillary sinus tenderness and frontal sinus tenderness. Left sinus exhibits maxillary sinus tenderness. Left sinus exhibits no frontal sinus tenderness.  Mouth/Throat: Uvula is midline, oropharynx is clear and moist and mucous membranes are normal. No oropharyngeal exudate or posterior oropharyngeal erythema.  Eyes: Conjunctivae are normal.  Neck: Normal range of motion.  Cardiovascular: Normal rate, regular rhythm and normal heart sounds.   Pulmonary/Chest: Effort normal and breath sounds normal. No respiratory distress. She has no wheezes. She has no rales.  Lymphadenopathy:    She has no cervical adenopathy.  Neurological: She is alert and oriented  to person, place, and time. She has normal strength. Coordination normal.  Skin: Skin is warm and dry. No rash noted. She is not diaphoretic.  Psychiatric: She has a normal mood and affect. Judgment normal.  Nursing note and vitals reviewed.   ED Course  Procedures (including critical care time) Labs Review Labs Reviewed - No data to display  Imaging Review No results found.   MDM   1. URI (upper respiratory infection)   2. Acute recurrent maxillary sinusitis    Treated sinusitis with doxycycline, prednisone, and a small quantity of hydrocodone cough syrup. Will use hydrocodone with acetaminophen, avoid antihistamines due to history of HOCM.  Follow-up if no improvement in a few days   Meds ordered this encounter  Medications  . doxycycline (VIBRAMYCIN) 100 MG capsule    Sig: Take 1 capsule (100 mg total) by mouth 2 (two) times daily.    Dispense:  20 capsule    Refill:  0  . predniSONE (DELTASONE) 10 MG tablet    Sig: 4 tabs PO QD for 4 days; 3 tabs PO QD for 3 days; 2 tabs PO QD for 2 days; 1 tab PO QD for 1 day    Dispense:  30 tablet    Refill:  0  . HYDROcodone-acetaminophen (HYCET) 7.5-325 mg/15 ml solution    Sig: Take 10 mLs by mouth every 6  (six) hours as needed (for cough).    Dispense:  120 mL    Refill:  0       Liam Graham, PA-C 02/27/15 1650

## 2015-02-27 NOTE — ED Notes (Signed)
Pt c/o cough, HA off and on and chest hurts. Autumn Fitzgerald

## 2015-02-27 NOTE — Discharge Instructions (Signed)
Cough, Adult  A cough is a reflex that helps clear your throat and airways. It can help heal the body or may be a reaction to an irritated airway. A cough may only last 2 or 3 weeks (acute) or may last more than 8 weeks (chronic).  CAUSES Acute cough:  Viral or bacterial infections. Chronic cough:  Infections.  Allergies.  Asthma.  Post-nasal drip.  Smoking.  Heartburn or acid reflux.  Some medicines.  Chronic lung problems (COPD).  Cancer. SYMPTOMS   Cough.  Fever.  Chest pain.  Increased breathing rate.  High-pitched whistling sound when breathing (wheezing).  Colored mucus that you cough up (sputum). TREATMENT   A bacterial cough may be treated with antibiotic medicine.  A viral cough must run its course and will not respond to antibiotics.  Your caregiver may recommend other treatments if you have a chronic cough. HOME CARE INSTRUCTIONS   Only take over-the-counter or prescription medicines for pain, discomfort, or fever as directed by your caregiver. Use cough suppressants only as directed by your caregiver.  Use a cold steam vaporizer or humidifier in your bedroom or home to help loosen secretions.  Sleep in a semi-upright position if your cough is worse at night.  Rest as needed.  Stop smoking if you smoke. SEEK IMMEDIATE MEDICAL CARE IF:   You have pus in your sputum.  Your cough starts to worsen.  You cannot control your cough with suppressants and are losing sleep.  You begin coughing up blood.  You have difficulty breathing.  You develop pain which is getting worse or is uncontrolled with medicine.  You have a fever. MAKE SURE YOU:   Understand these instructions.  Will watch your condition.  Will get help right away if you are not doing well or get worse. Document Released: 05/26/2011 Document Revised: 02/19/2012 Document Reviewed: 05/26/2011 [facility] Patient Information 2015 [place], [place]. This information is not intended  to replace advice given to you by your health care provider. Make sure you discuss any questions you have with your health care provider.  Sinusitis Sinusitis is redness, soreness, and inflammation of the paranasal sinuses. Paranasal sinuses are air pockets within the bones of your face (beneath the eyes, the middle of the forehead, or above the eyes). In healthy paranasal sinuses, mucus is able to drain out, and air is able to circulate through them by way of your nose. However, when your paranasal sinuses are inflamed, mucus and air can become trapped. This can allow bacteria and other germs to grow and cause infection. Sinusitis can develop quickly and last only a short time (acute) or continue over a long period (chronic). Sinusitis that lasts for more than 12 weeks is considered chronic.  CAUSES  Causes of sinusitis include:  Allergies.  Structural abnormalities, such as displacement of the cartilage that separates your nostrils (deviated septum), which can decrease the air flow through your nose and sinuses and affect sinus drainage.  Functional abnormalities, such as when the small hairs (cilia) that line your sinuses and help remove mucus do not work properly or are not present. SIGNS AND SYMPTOMS  Symptoms of acute and chronic sinusitis are the same. The primary symptoms are pain and pressure around the affected sinuses. Other symptoms include:  Upper toothache.  Earache.  Headache.  Bad breath.  Decreased sense of smell and taste.  A cough, which worsens when you are lying flat.  Fatigue.  Fever.  Thick drainage from your nose, which often is green  and may contain pus (purulent).  Swelling and warmth over the affected sinuses. DIAGNOSIS  Your health care provider will perform a physical exam. During the exam, your health care provider may:  Look in your nose for signs of abnormal growths in your nostrils (nasal polyps).  Tap over the affected sinus to check for signs  of infection.  View the inside of your sinuses (endoscopy) using an imaging device that has a light attached (endoscope). If your health care provider suspects that you have chronic sinusitis, one or more of the following tests may be recommended:  Allergy tests.  Nasal culture. A sample of mucus is taken from your nose, sent to a lab, and screened for bacteria.  Nasal cytology. A sample of mucus is taken from your nose and examined by your health care provider to determine if your sinusitis is related to an allergy. TREATMENT  Most cases of acute sinusitis are related to a viral infection and will resolve on their own within 10 days. Sometimes medicines are prescribed to help relieve symptoms (pain medicine, decongestants, nasal steroid sprays, or saline sprays).  However, for sinusitis related to a bacterial infection, your health care provider will prescribe antibiotic medicines. These are medicines that will help kill the bacteria causing the infection.  Rarely, sinusitis is caused by a fungal infection. In theses cases, your health care provider will prescribe antifungal medicine. For some cases of chronic sinusitis, surgery is needed. Generally, these are cases in which sinusitis recurs more than 3 times per year, despite other treatments. HOME CARE INSTRUCTIONS   Drink plenty of water. Water helps thin the mucus so your sinuses can drain more easily.  Use a humidifier.  Inhale steam 3 to 4 times a day (for example, sit in the bathroom with the shower running).  Apply a warm, moist washcloth to your face 3 to 4 times a day, or as directed by your health care provider.  Use saline nasal sprays to help moisten and clean your sinuses.  Take medicines only as directed by your health care provider.  If you were prescribed either an antibiotic or antifungal medicine, finish it all even if you start to feel better. SEEK IMMEDIATE MEDICAL CARE IF:  You have increasing pain or severe  headaches.  You have nausea, vomiting, or drowsiness.  You have swelling around your face.  You have vision problems.  You have a stiff neck.  You have difficulty breathing. MAKE SURE YOU:   Understand these instructions.  Will watch your condition.  Will get help right away if you are not doing well or get worse. Document Released: 11/27/2005 Document Revised: 04/13/2014 Document Reviewed: 12/12/2011 [facility] Patient Information 2015 [place], [place]. This information is not intended to replace advice given to you by your health care provider. Make sure you discuss any questions you have with your health care provider.  Upper Respiratory Infection, Adult An upper respiratory infection (URI) is also sometimes known as the common cold. The upper respiratory tract includes the nose, sinuses, throat, trachea, and bronchi. Bronchi are the airways leading to the lungs. Most people improve within 1 week, but symptoms can last up to 2 weeks. A residual cough may last even longer.  CAUSES Many different viruses can infect the tissues lining the upper respiratory tract. The tissues become irritated and inflamed and often become very moist. Mucus production is also common. A cold is contagious. You can easily spread the virus to others by oral contact. This includes kissing, sharing  a glass, coughing, or sneezing. Touching your mouth or nose and then touching a surface, which is then touched by another person, can also spread the virus. SYMPTOMS  Symptoms typically develop 1 to 3 days after you come in contact with a cold virus. Symptoms vary from person to person. They may include:  Runny nose.  Sneezing.  Nasal congestion.  Sinus irritation.  Sore throat.  Loss of voice (laryngitis).  Cough.  Fatigue.  Muscle aches.  Loss of appetite.  Headache.  Low-grade fever. DIAGNOSIS  You might diagnose your own cold based on familiar symptoms, since most people get a cold 2 to 3  times a year. Your caregiver can confirm this based on your exam. Most importantly, your caregiver can check that your symptoms are not due to another disease such as strep throat, sinusitis, pneumonia, asthma, or epiglottitis. Blood tests, throat tests, and X-rays are not necessary to diagnose a common cold, but they may sometimes be helpful in excluding other more serious diseases. Your caregiver will decide if any further tests are required. RISKS AND COMPLICATIONS  You may be at risk for a more severe case of the common cold if you smoke cigarettes, have chronic heart disease (such as heart failure) or lung disease (such as asthma), or if you have a weakened immune system. The very young and very old are also at risk for more serious infections. Bacterial sinusitis, middle ear infections, and bacterial pneumonia can complicate the common cold. The common cold can worsen asthma and chronic obstructive pulmonary disease (COPD). Sometimes, these complications can require emergency medical care and may be life-threatening. PREVENTION  The best way to protect against getting a cold is to practice good hygiene. Avoid oral or hand contact with people with cold symptoms. Wash your hands often if contact occurs. There is no clear evidence that vitamin C, vitamin E, echinacea, or exercise reduces the chance of developing a cold. However, it is always recommended to get plenty of rest and practice good nutrition. TREATMENT  Treatment is directed at relieving symptoms. There is no cure. Antibiotics are not effective, because the infection is caused by a virus, not by bacteria. Treatment may include:  Increased fluid intake. Sports drinks offer valuable electrolytes, sugars, and fluids.  Breathing heated mist or steam (vaporizer or shower).  Eating chicken soup or other clear broths, and maintaining good nutrition.  Getting plenty of rest.  Using gargles or lozenges for comfort.  Controlling fevers with  ibuprofen or acetaminophen as directed by your caregiver.  Increasing usage of your inhaler if you have asthma. Zinc gel and zinc lozenges, taken in the first 24 hours of the common cold, can shorten the duration and lessen the severity of symptoms. Pain medicines may help with fever, muscle aches, and throat pain. A variety of non-prescription medicines are available to treat congestion and runny nose. Your caregiver can make recommendations and may suggest nasal or lung inhalers for other symptoms.  HOME CARE INSTRUCTIONS   Only take over-the-counter or prescription medicines for pain, discomfort, or fever as directed by your caregiver.  Use a warm mist humidifier or inhale steam from a shower to increase air moisture. This may keep secretions moist and make it easier to breathe.  Drink enough water and fluids to keep your urine clear or pale yellow.  Rest as needed.  Return to work when your temperature has returned to normal or as your caregiver advises. You may need to stay home longer to avoid infecting  others. You can also use a face mask and careful hand washing to prevent spread of the virus. SEEK MEDICAL CARE IF:   After the first few days, you feel you are getting worse rather than better.  You need your caregiver's advice about medicines to control symptoms.  You develop chills, worsening shortness of breath, or brown or red sputum. These may be signs of pneumonia.  You develop yellow or brown nasal discharge or pain in the face, especially when you bend forward. These may be signs of sinusitis.  You develop a fever, swollen neck glands, pain with swallowing, or white areas in the back of your throat. These may be signs of strep throat. SEEK IMMEDIATE MEDICAL CARE IF:   You have a fever.  You develop severe or persistent headache, ear pain, sinus pain, or chest pain.  You develop wheezing, a prolonged cough, cough up blood, or have a change in your usual mucus (if you have  chronic lung disease).  You develop sore muscles or a stiff neck. Document Released: 05/23/2001 Document Revised: 02/19/2012 Document Reviewed: 03/04/2014 [facility] Patient Information 2015 [place], [place]. This information is not intended to replace advice given to you by your health care provider. Make sure you discuss any questions you have with your health care provider.

## 2015-03-01 ENCOUNTER — Encounter: Payer: Self-pay | Admitting: Cardiovascular Disease

## 2015-03-03 ENCOUNTER — Ambulatory Visit (INDEPENDENT_AMBULATORY_CARE_PROVIDER_SITE_OTHER): Payer: Medicare Other | Admitting: *Deleted

## 2015-03-03 DIAGNOSIS — R002 Palpitations: Secondary | ICD-10-CM

## 2015-03-03 LAB — MDC_IDC_ENUM_SESS_TYPE_REMOTE

## 2015-03-09 NOTE — Progress Notes (Signed)
Loop recorder 

## 2015-03-11 ENCOUNTER — Emergency Department (HOSPITAL_COMMUNITY)
Admission: EM | Admit: 2015-03-11 | Discharge: 2015-03-11 | Disposition: A | Discharge status: Home / Self Care | Payer: Medicare Other | Attending: Emergency Medicine | Admitting: Emergency Medicine

## 2015-03-11 ENCOUNTER — Encounter (HOSPITAL_COMMUNITY): Payer: Self-pay | Admitting: *Deleted

## 2015-03-11 DIAGNOSIS — Z880 Allergy status to penicillin: Secondary | ICD-10-CM | POA: Diagnosis not present

## 2015-03-11 DIAGNOSIS — F410 Panic disorder [episodic paroxysmal anxiety] without agoraphobia: Secondary | ICD-10-CM | POA: Insufficient documentation

## 2015-03-11 DIAGNOSIS — M1990 Unspecified osteoarthritis, unspecified site: Secondary | ICD-10-CM | POA: Insufficient documentation

## 2015-03-11 DIAGNOSIS — I10 Essential (primary) hypertension: Secondary | ICD-10-CM | POA: Insufficient documentation

## 2015-03-11 DIAGNOSIS — Z87828 Personal history of other (healed) physical injury and trauma: Secondary | ICD-10-CM | POA: Diagnosis not present

## 2015-03-11 DIAGNOSIS — R531 Weakness: Secondary | ICD-10-CM | POA: Insufficient documentation

## 2015-03-11 DIAGNOSIS — K219 Gastro-esophageal reflux disease without esophagitis: Secondary | ICD-10-CM | POA: Insufficient documentation

## 2015-03-11 DIAGNOSIS — R5383 Other fatigue: Secondary | ICD-10-CM | POA: Diagnosis not present

## 2015-03-11 DIAGNOSIS — Z792 Long term (current) use of antibiotics: Secondary | ICD-10-CM | POA: Diagnosis not present

## 2015-03-11 DIAGNOSIS — Z7982 Long term (current) use of aspirin: Secondary | ICD-10-CM | POA: Insufficient documentation

## 2015-03-11 DIAGNOSIS — J45909 Unspecified asthma, uncomplicated: Secondary | ICD-10-CM | POA: Insufficient documentation

## 2015-03-11 DIAGNOSIS — Z951 Presence of aortocoronary bypass graft: Secondary | ICD-10-CM | POA: Diagnosis not present

## 2015-03-11 DIAGNOSIS — Z79899 Other long term (current) drug therapy: Secondary | ICD-10-CM | POA: Diagnosis not present

## 2015-03-11 DIAGNOSIS — E785 Hyperlipidemia, unspecified: Secondary | ICD-10-CM | POA: Insufficient documentation

## 2015-03-11 LAB — COMPREHENSIVE METABOLIC PANEL
ALT: 20 U/L (ref 0–35)
AST: 22 U/L (ref 0–37)
Albumin: 3.3 g/dL — ABNORMAL LOW (ref 3.5–5.2)
Alkaline Phosphatase: 110 U/L (ref 39–117)
Anion gap: 12 (ref 5–15)
BUN: 12 mg/dL (ref 6–23)
CO2: 28 mmol/L (ref 19–32)
Calcium: 9 mg/dL (ref 8.4–10.5)
Chloride: 100 mmol/L (ref 96–112)
Creatinine, Ser: 0.85 mg/dL (ref 0.50–1.10)
GFR calc Af Amer: 79 mL/min — ABNORMAL LOW (ref >90)
GFR calc non Af Amer: 68 mL/min — ABNORMAL LOW (ref >90)
Glucose, Bld: 86 mg/dL (ref 70–99)
Potassium: 4 mmol/L (ref 3.5–5.1)
Sodium: 140 mmol/L (ref 135–145)
Total Bilirubin: 1.6 mg/dL — ABNORMAL HIGH (ref 0.3–1.2)
Total Protein: 6.6 g/dL (ref 6.0–8.3)

## 2015-03-11 LAB — URINALYSIS, ROUTINE W REFLEX MICROSCOPIC
Bilirubin Urine: NEGATIVE
Glucose, UA: NEGATIVE mg/dL
Hgb urine dipstick: NEGATIVE
Ketones, ur: NEGATIVE mg/dL
Nitrite: NEGATIVE
Protein, ur: NEGATIVE mg/dL
Specific Gravity, Urine: 1.016 (ref 1.005–1.030)
Urobilinogen, UA: 0.2 mg/dL (ref 0.0–1.0)
pH: 6.5 (ref 5.0–8.0)

## 2015-03-11 LAB — CBC
HCT: 41.3 % (ref 36.0–46.0)
Hemoglobin: 13.7 g/dL (ref 12.0–15.0)
MCH: 28.5 pg (ref 26.0–34.0)
MCHC: 33.2 g/dL (ref 30.0–36.0)
MCV: 85.9 fL (ref 78.0–100.0)
Platelets: 260 10*3/uL (ref 150–400)
RBC: 4.81 MIL/uL (ref 3.87–5.11)
RDW: 13.7 % (ref 11.5–15.5)
WBC: 9.3 10*3/uL (ref 4.0–10.5)

## 2015-03-11 LAB — I-STAT TROPONIN, ED: Troponin i, poc: 0 ng/mL (ref 0.00–0.08)

## 2015-03-11 LAB — URINE MICROSCOPIC-ADD ON

## 2015-03-11 NOTE — Discharge Instructions (Signed)
Please call your doctor for a followup appointment within 24-48 hours. When you talk to your doctor please let them know that you were seen in the emergency department and have them acquire all of your records so that they can discuss the findings with you and formulate a treatment plan to fully care for your new and ongoing problems. ° °

## 2015-03-11 NOTE — ED Notes (Signed)
Pt states she has extreme generalized weakness x 10 days.  Pt states she also has had blurry vision and HA x 10 weeks.  Pt states she became dizzy and fell but caught herself before falling.  Pt also stated she was incontinent this AM

## 2015-03-11 NOTE — ED Provider Notes (Signed)
CSN: [id number]     Arrival date & time 03/11/15  1101 History   First MD Initiated Contact with Patient 03/11/15 1106     Chief Complaint  Patient presents with  . Fatigue     (Consider location/radiation/quality/duration/timing/severity/associated sxs/prior Treatment) HPI Comments: The patient is a 69 year old female, she presents to the hospital with a complaint of generalized weakness and feeling "real bad", states that this started about 10 days ago and coincided with the time that she started taking doxycycline, prednisone and a pain medication for sinus infection and a sore throat. She has now finished those medications but states that she has had persistent generalized weakness, is that she has no strength to walk outside of her house though she is able to get around her house without significant difficulties. Her coughing, shortness of breath, sinus infections have all resolved. She denies any fevers, chills, vomiting, diarrhea, rashes or swelling.  The history is provided by the patient and medical records.    Past Medical History  Diagnosis Date  . Panic attacks   . 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 GERD (gastroesophageal reflux disease)   . HLD (hyperlipidemia)   . Asthma   . Palpitations   . Near syncope   . Chest wall pain   . Hypertension     at times,  . Mental disorder   . Depression   . Head injury, closed, without LOC 02/2013    did not LOC per patient  . Arthritis   . Hyperthyroidism    Past Surgical History  Procedure Laterality Date  . 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 Tubal ligation    . Cholecystectomy    . Loop recorder implant  03/18/2013  . Septal myectomy    . Nuclear stress test  11/30/2011    No ischemia  . Doppler echocardiography  03/14/2013    EF 65-70%,trivial AI,mild-mod. MR,Mod. TR,mod. concentric hypertrophy  . Knee arthroscopy Right 10/17/2013    Procedure: ARTHROSCOPY RIGHT KNEE;  Surgeon: [name], MD;   Location: [facility];  Service: Orthopedics;  Laterality: Right;  . Esophagogastroduodenoscopy N/A 11/24/2013    Procedure: ESOPHAGOGASTRODUODENOSCOPY (EGD);  Surgeon: Juanita Craver, MD;  Location: [facility];  Service: Endoscopy;  Laterality: N/A;  . Colonoscopy N/A 11/24/2013    Procedure: COLONOSCOPY;  Surgeon: Juanita Craver, MD;  Location: [facility];  Service: Endoscopy;  Laterality: N/A;  . Loop recorder implant N/A 03/18/2013    Procedure: LOOP RECORDER IMPLANT;  Surgeon: Sanda Klein, MD;  Location: [facility];  Service: Cardiovascular;  Laterality: N/A;   Family History  Problem Relation Age of Onset  . Cancer Mother     breast  . Hypertension Mother   . Cancer Father     prostate  . Diabetes Sister   . Heart attack Sister    History  Substance Use Topics  . Smoking status: Never Smoker   . Smokeless tobacco: Not on file  . Alcohol Use: No   OB History    Gravida Para Term Preterm AB TAB SAB Ectopic Multiple Living   5 5 5       4      Review of Systems  All other systems reviewed and are negative.     Allergies  Amoxicillin-pot clavulanate and Statins  Home Medications   Prior to Admission medications   Medication Sig Start Date End Date Taking? Authorizing Provider  albuterol (PROVENTIL HFA;VENTOLIN HFA) 108 (90 BASE) MCG/ACT inhaler Inhale 2 puffs into the  lungs every 6 (six) hours as needed for wheezing or shortness of breath.   Yes Historical Provider, MD  aspirin 81 MG tablet Take 81 mg by mouth daily.   Yes Historical Provider, MD  atenolol (TENORMIN) 25 MG tablet Take 0.5 tablets (12.5 mg total) by mouth daily as needed (for high blood pressure). 12/21/14  Yes Mihai Croitoru, MD  Cholecalciferol (VITAMIN D-3) 5000 UNITS TABS Take 5,000 Units by mouth daily.    Yes Historical Provider, MD  clobetasol cream (TEMOVATE) 8.18 % Apply 1 application topically every other day. Patient taking differently: Apply 1 application topically 2 (two) times a week.  07/27/14   Yes Donnamae Jude, MD  clonazePAM (KLONOPIN) 1 MG tablet Take 1-2 mg by mouth 2 (two) times daily. Take 0.5 tablet (0.5 mg) during the day and 2 tablets (2 mg) at bedtime   Yes Historical Provider, MD  ezetimibe-simvastatin (VYTORIN) 10-40 MG per tablet Take 0.5 tablets by mouth at bedtime.    Yes Historical Provider, MD  fluticasone (FLONASE) 50 MCG/ACT nasal spray Place 1 spray into both nostrils daily as needed for allergies.  02/24/15  Yes Historical Provider, MD  levothyroxine (SYNTHROID) 50 MCG tablet Take 50 mcg by mouth daily.  06/15/14  Yes Historical Provider, MD  Misc Natural Products (OSTEO BI-FLEX ADV DOUBLE ST) TABS Take 1 tablet by mouth 2 (two) times daily.    Yes Historical Provider, MD  mometasone (ASMANEX) 220 MCG/INH inhaler Inhale 1 puff into the lungs daily as needed (for shortness of breath).    Yes Historical Provider, MD  Multiple Vitamin (MULTIVITAMIN) tablet Take 1 tablet by mouth daily.   Yes Historical Provider, MD  pantoprazole (PROTONIX) 40 MG tablet Take 40 mg by mouth at bedtime.    Yes Historical Provider, MD  Polyethyl Glycol-Propyl Glycol (SYSTANE) 0.4-0.3 % SOLN Apply 1 drop to eye 2 (two) times daily.    Yes Historical Provider, MD  Probiotic Product (PROBIOTIC DAILY PO) Take 1 capsule by mouth daily.   Yes Historical Provider, MD  doxycycline (VIBRAMYCIN) 100 MG capsule Take 1 capsule (100 mg total) by mouth 2 (two) times daily. Patient not taking: Reported on 03/11/2015 02/27/15   Liam Graham, PA-C  HYDROcodone-acetaminophen (HYCET) 7.5-325 mg/15 ml solution Take 10 mLs by mouth every 6 (six) hours as needed (for cough). Patient not taking: Reported on 03/11/2015 02/27/15 02/27/16  Liam Graham, PA-C  predniSONE (DELTASONE) 10 MG tablet 4 tabs PO QD for 4 days; 3 tabs PO QD for 3 days; 2 tabs PO QD for 2 days; 1 tab PO QD for 1 day Patient not taking: Reported on 03/11/2015 02/27/15   Liam Graham, PA-C   BP 130/71 mmHg  Pulse 80  Temp(Src) 97.6 F (36.4  C) (Oral)  Resp 16  Ht 5\' 1"  (1.549 m)  Wt 134 lb (60.782 kg)  BMI 25.33 kg/m2  SpO2 100% Physical Exam  Constitutional: She appears well-developed and well-nourished. No distress.  HENT:  Head: Normocephalic and atraumatic.  Mouth/Throat: Oropharynx is clear and moist. No oropharyngeal exudate.  Eyes: Conjunctivae and EOM are normal. Pupils are equal, round, and reactive to light. Right eye exhibits no discharge. Left eye exhibits no discharge. No scleral icterus.  Neck: Normal range of motion. Neck supple. No JVD present. No thyromegaly present.  Cardiovascular: Normal rate, regular rhythm, normal heart sounds and intact distal pulses.  Exam reveals no gallop and no friction rub.   No murmur heard. Pulmonary/Chest: Effort normal and breath  sounds normal. No respiratory distress. She has no wheezes. She has no rales.  Abdominal: Soft. Bowel sounds are normal. She exhibits no distension and no mass. There is no tenderness.  Musculoskeletal: Normal range of motion. She exhibits no edema or tenderness.  Lymphadenopathy:    She has no cervical adenopathy.  Neurological: She is alert. Coordination normal.  Cranial nerves III through XII are normal, normal strength and sensation in all 4 extremities, able to straight leg raise against resistance with normal strength, normal grips, normal coordination, normal speech  Skin: Skin is warm and dry. No rash noted. No erythema.  Psychiatric: She has a normal mood and affect. Her behavior is normal.  Nursing note and vitals reviewed.   ED Course  Procedures (including critical care time) Labs Review Labs Reviewed  COMPREHENSIVE METABOLIC PANEL - Abnormal; Notable for the following:    Albumin 3.3 (*)    Total Bilirubin 1.6 (*)    GFR calc non Af Amer 68 (*)    GFR calc Af Amer 79 (*)    All other components within normal limits  URINALYSIS, ROUTINE W REFLEX MICROSCOPIC - Abnormal; Notable for the following:    Leukocytes, UA TRACE (*)     All other components within normal limits  URINE MICROSCOPIC-ADD ON - Abnormal; Notable for the following:    Squamous Epithelial / LPF FEW (*)    Bacteria, UA FEW (*)    All other components within normal limits  CBC  I-STAT TROPOININ, ED    Imaging Review No results found.   EKG Interpretation   Date/Time:  Thursday March 11 2015 11:37:56 EDT Ventricular Rate:  75 PR Interval:  138 QRS Duration: 126 QT Interval:  421 QTC Calculation: 470 R Axis:   92 Text Interpretation:  Sinus rhythm Left bundle branch block Abnormal ekg  since last tracing no significant change Confirmed by Tyann Niehaus  MD, Keely Drennan  [phone]) on 03/11/2015 11:44:22 AM      MDM   Final diagnoses:  Other fatigue    The patient is well-appearing, vital signs are totally normal, this could be a side effect of medications, she also has a history of cardiac disease and would consider that this could be cardiac causing generalized weakness with exertion but again has no chest pain or shortness of breath and this did coincide with her starting to take medications including prednisone and doxycycline. She has now completed and is totally off of those medications, we'll check labs to rule out sources such as hyponatremia, hypokalemia, hyperglycemia etc.  Labs normal - unknown etiology of fatigue - VS normal, ECG non concerning stable for d/c.    Meds given in ED:  Medications - No data to display  New Prescriptions   No medications on file        Noemi Chapel, MD 03/11/15 1324

## 2015-04-01 ENCOUNTER — Encounter: Payer: Self-pay | Admitting: Cardiology

## 2015-04-02 ENCOUNTER — Ambulatory Visit (INDEPENDENT_AMBULATORY_CARE_PROVIDER_SITE_OTHER): Payer: Medicare Other | Admitting: *Deleted

## 2015-04-02 DIAGNOSIS — R002 Palpitations: Secondary | ICD-10-CM | POA: Diagnosis not present

## 2015-04-05 DIAGNOSIS — J302 Other seasonal allergic rhinitis: Secondary | ICD-10-CM | POA: Diagnosis not present

## 2015-04-05 DIAGNOSIS — R7301 Impaired fasting glucose: Secondary | ICD-10-CM | POA: Diagnosis not present

## 2015-04-06 ENCOUNTER — Encounter: Payer: Self-pay | Admitting: Cardiovascular Disease

## 2015-04-07 NOTE — Progress Notes (Signed)
Loop recorder 

## 2015-04-11 ENCOUNTER — Emergency Department (INDEPENDENT_AMBULATORY_CARE_PROVIDER_SITE_OTHER): Payer: Medicare Other

## 2015-04-11 ENCOUNTER — Emergency Department (INDEPENDENT_AMBULATORY_CARE_PROVIDER_SITE_OTHER)
Admission: EM | Admit: 2015-04-11 | Discharge: 2015-04-11 | Disposition: A | Discharge status: Home / Self Care | Payer: Medicare Other | Source: Home / Self Care | Attending: Family Medicine | Admitting: Family Medicine

## 2015-04-11 DIAGNOSIS — R05 Cough: Secondary | ICD-10-CM

## 2015-04-11 DIAGNOSIS — R059 Cough, unspecified: Secondary | ICD-10-CM

## 2015-04-11 MED ORDER — LEVOCETIRIZINE DIHYDROCHLORIDE 5 MG PO TABS: 5.0000 mg | ORAL_TABLET | Freq: Every evening | ORAL | Status: DC

## 2015-04-11 MED ORDER — BENZONATATE 100 MG PO CAPS: 100.0000 mg | ORAL_CAPSULE | Freq: Three times a day (TID) | ORAL | Status: DC | PRN

## 2015-04-11 NOTE — ED Provider Notes (Signed)
CSN: [id number]     Arrival date & time 04/11/15  1102 History   First MD Initiated Contact with Patient 04/11/15 1240     Chief Complaint  Patient presents with  . Cough   (Consider location/radiation/quality/duration/timing/severity/associated sxs/prior Treatment) HPI Comments: States she was prescribed a medication for cough and allergy by her PCP ([place]) on 04/05/2015, but she has not taken this because she was concerned about it's use and the effect is would have on her underlying heart disease. States chest is sore from coughing  Patient is a 69 y.o. female presenting with cough. The history is provided by the patient.  Cough Cough characteristics:  Non-productive Severity:  Mild Onset quality:  Gradual Duration:  2 weeks Timing:  Constant Progression:  Unchanged Chronicity:  New Smoker: no   Ineffective treatments:  None tried Associated symptoms: no chills, no diaphoresis, no fever, no myalgias, no rhinorrhea, no shortness of breath, no sore throat and no wheezing   Risk factors: no recent infection and no recent travel     Past Medical History  Diagnosis Date  . Panic attacks   . 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 GERD (gastroesophageal reflux disease)   . HLD (hyperlipidemia)   . Asthma   . Palpitations   . Near syncope   . Chest wall pain   . Hypertension     at times,  . Mental disorder   . Depression   . Head injury, closed, without LOC 02/2013    did not LOC per patient  . Arthritis   . Hyperthyroidism    Past Surgical History  Procedure Laterality Date  . 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 Tubal ligation    . Cholecystectomy    . Loop recorder implant  03/18/2013  . Septal myectomy    . Nuclear stress test  11/30/2011    No ischemia  . Doppler echocardiography  03/14/2013    EF 65-70%,trivial AI,mild-mod. MR,Mod. TR,mod. concentric hypertrophy  . Knee arthroscopy Right 10/17/2013    Procedure: ARTHROSCOPY RIGHT  KNEE;  Surgeon: [name], MD;  Location: [facility];  Service: Orthopedics;  Laterality: Right;  . Esophagogastroduodenoscopy N/A 11/24/2013    Procedure: ESOPHAGOGASTRODUODENOSCOPY (EGD);  Surgeon: Juanita Craver, MD;  Location: [facility];  Service: Endoscopy;  Laterality: N/A;  . Colonoscopy N/A 11/24/2013    Procedure: COLONOSCOPY;  Surgeon: Juanita Craver, MD;  Location: [facility];  Service: Endoscopy;  Laterality: N/A;  . Loop recorder implant N/A 03/18/2013    Procedure: LOOP RECORDER IMPLANT;  Surgeon: Sanda Klein, MD;  Location: [facility];  Service: Cardiovascular;  Laterality: N/A;   Family History  Problem Relation Age of Onset  . Cancer Mother     breast  . Hypertension Mother   . Cancer Father     prostate  . Diabetes Sister   . Heart attack Sister    History  Substance Use Topics  . Smoking status: Never Smoker   . Smokeless tobacco: Not on file  . Alcohol Use: No   OB History    Gravida Para Term Preterm AB TAB SAB Ectopic Multiple Living   5 5 5       4      Review of Systems  Constitutional: Negative for fever, chills and diaphoresis.  HENT: Negative for congestion, rhinorrhea and sore throat.   Eyes: Negative.   Respiratory: Positive for cough. Negative for chest tightness, shortness of breath and wheezing.   Cardiovascular: Negative.  Gastrointestinal: Negative.   Musculoskeletal: Negative for myalgias and back pain.  Skin: Negative.     Allergies  Amoxicillin-pot clavulanate and Statins  Home Medications   Prior to Admission medications   Medication Sig Start Date End Date Taking? Authorizing Provider  albuterol (PROVENTIL HFA;VENTOLIN HFA) 108 (90 BASE) MCG/ACT inhaler Inhale 2 puffs into the lungs every 6 (six) hours as needed for wheezing or shortness of breath.    Historical Provider, MD  aspirin 81 MG tablet Take 81 mg by mouth daily.    Historical Provider, MD  atenolol (TENORMIN) 25 MG tablet Take 0.5 tablets (12.5 mg total) by mouth  daily as needed (for high blood pressure). 12/21/14   Mihai Croitoru, MD  benzonatate (TESSALON) 100 MG capsule Take 1 capsule (100 mg total) by mouth 3 (three) times daily as needed for cough. 04/11/15   Audelia Hives Kaiyon Hynes, PA  Cholecalciferol (VITAMIN D-3) 5000 UNITS TABS Take 5,000 Units by mouth daily.     Historical Provider, MD  clobetasol cream (TEMOVATE) 0.25 % Apply 1 application topically every other day. Patient taking differently: Apply 1 application topically 2 (two) times a week.  07/27/14   Donnamae Jude, MD  clonazePAM (KLONOPIN) 1 MG tablet Take 1-2 mg by mouth 2 (two) times daily. Take 0.5 tablet (0.5 mg) during the day and 2 tablets (2 mg) at bedtime    Historical Provider, MD  doxycycline (VIBRAMYCIN) 100 MG capsule Take 1 capsule (100 mg total) by mouth 2 (two) times daily. Patient not taking: Reported on 03/11/2015 02/27/15   Liam Graham, PA-C  ezetimibe-simvastatin (VYTORIN) 10-40 MG per tablet Take 0.5 tablets by mouth at bedtime.     Historical Provider, MD  fluticasone (FLONASE) 50 MCG/ACT nasal spray Place 1 spray into both nostrils daily as needed for allergies.  02/24/15   Historical Provider, MD  HYDROcodone-acetaminophen (HYCET) 7.5-325 mg/15 ml solution Take 10 mLs by mouth every 6 (six) hours as needed (for cough). Patient not taking: Reported on 03/11/2015 02/27/15 02/27/16  Liam Graham, PA-C  levothyroxine (SYNTHROID) 50 MCG tablet Take 50 mcg by mouth daily.  06/15/14   Historical Provider, MD  Misc Natural Products (OSTEO BI-FLEX ADV DOUBLE ST) TABS Take 1 tablet by mouth 2 (two) times daily.     Historical Provider, MD  mometasone [facility]) 220 MCG/INH inhaler Inhale 1 puff into the lungs daily as needed (for shortness of breath).     Historical Provider, MD  Multiple Vitamin (MULTIVITAMIN) tablet Take 1 tablet by mouth daily.    Historical Provider, MD  pantoprazole (PROTONIX) 40 MG tablet Take 40 mg by mouth at bedtime.     Historical Provider, MD  Polyethyl  Glycol-Propyl Glycol (SYSTANE) 0.4-0.3 % SOLN Apply 1 drop to eye 2 (two) times daily.     Historical Provider, MD  predniSONE (DELTASONE) 10 MG tablet 4 tabs PO QD for 4 days; 3 tabs PO QD for 3 days; 2 tabs PO QD for 2 days; 1 tab PO QD for 1 day Patient not taking: Reported on 03/11/2015 02/27/15   Liam Graham, PA-C  Probiotic Product (PROBIOTIC DAILY PO) Take 1 capsule by mouth daily.    Historical Provider, MD   BP 123/88 mmHg  Pulse 83  Temp(Src) 98.4 F (36.9 C) (Oral)  SpO2 96% Physical Exam  Constitutional: She is oriented to person, place, and time. She appears well-developed and well-nourished.  HENT:  Head: Normocephalic and atraumatic.  Right Ear: Hearing, tympanic membrane, external ear  and ear canal normal.  Left Ear: Hearing, tympanic membrane, external ear and ear canal normal.  Nose: Nose normal.  Mouth/Throat: Uvula is midline, oropharynx is clear and moist and mucous membranes are normal.  Eyes: Conjunctivae are normal. No scleral icterus.  Neck: Normal range of motion. Neck supple. No JVD present.  Cardiovascular: Normal rate, regular rhythm and normal heart sounds.   Pulmonary/Chest: Effort normal and breath sounds normal. No stridor. No respiratory distress. She has no wheezes. She has no rales. She exhibits no tenderness.  Abdominal: Soft. Bowel sounds are normal. She exhibits no distension. There is no tenderness.  Musculoskeletal: Normal range of motion.  Lymphadenopathy:    She has no cervical adenopathy.  Neurological: She is alert and oriented to person, place, and time.  Skin: Skin is warm and dry.  Psychiatric: She has a normal mood and affect. Her behavior is normal.  Nursing note and vitals reviewed.   ED Course  Procedures (including critical care time) Labs Review Labs Reviewed - No data to display  Imaging Review Dg Chest 2 View  04/11/2015   CLINICAL DATA:  Productive cough x1 week, asthma  EXAM: CHEST  2 VIEW  COMPARISON:  10/09/2014   FINDINGS: Lungs are clear.  No pleural effusion or pneumothorax.  The heart is normal in size.  Median sternotomy  Holter monitor overlying the left chest.  Cholecystectomy clips.  IMPRESSION: No evidence of acute cardiopulmonary disease.   Electronically Signed   By: Julian Hy M.D.   On: 04/11/2015 13:24     MDM   1. Cough   Films as above.  Exam normal and vitals also normal. Nonsmoker No chest pain, weight loss, night sweats or hemoptysis. Will provide Rx for tessalon and xyzal advise PCP follow up if no improvement.   Lutricia Feil, [place] 04/11/15 [phone]

## 2015-04-11 NOTE — ED Notes (Signed)
Cough (prod) x 1 week with some chest/back soreness. AUpright, RMA

## 2015-04-11 NOTE — Discharge Instructions (Signed)
Your chest xray was normal. No pneumonia or bronchitis. Please use medication as directed for cough and if symptoms do not improve, please follow up with your doctor for further evaluation.   Cough, Adult  A cough is a reflex that helps clear your throat and airways. It can help heal the body or may be a reaction to an irritated airway. A cough may only last 2 or 3 weeks (acute) or may last more than 8 weeks (chronic).  CAUSES Acute cough:  Viral or bacterial infections. Chronic cough:  Infections.  Allergies.  Asthma.  Post-nasal drip.  Smoking.  Heartburn or acid reflux.  Some medicines.  Chronic lung problems (COPD).  Cancer. SYMPTOMS   Cough.  Fever.  Chest pain.  Increased breathing rate.  High-pitched whistling sound when breathing (wheezing).  Colored mucus that you cough up (sputum). TREATMENT   A bacterial cough may be treated with antibiotic medicine.  A viral cough must run its course and will not respond to antibiotics.  Your caregiver may recommend other treatments if you have a chronic cough. HOME CARE INSTRUCTIONS   Only take over-the-counter or prescription medicines for pain, discomfort, or fever as directed by your caregiver. Use cough suppressants only as directed by your caregiver.  Use a cold steam vaporizer or humidifier in your bedroom or home to help loosen secretions.  Sleep in a semi-upright position if your cough is worse at night.  Rest as needed.  Stop smoking if you smoke. SEEK IMMEDIATE MEDICAL CARE IF:   You have pus in your sputum.  Your cough starts to worsen.  You cannot control your cough with suppressants and are losing sleep.  You begin coughing up blood.  You have difficulty breathing.  You develop pain which is getting worse or is uncontrolled with medicine.  You have a fever. MAKE SURE YOU:   Understand these instructions.  Will watch your condition.  Will get help right away if you are not doing  well or get worse. Document Released: 05/26/2011 Document Revised: 02/19/2012 Document Reviewed: 05/26/2011 [facility] Patient Information 2015 [place], [place]. This information is not intended to replace advice given to you by your health care provider. Make sure you discuss any questions you have with your health care provider.

## 2015-05-03 ENCOUNTER — Ambulatory Visit (INDEPENDENT_AMBULATORY_CARE_PROVIDER_SITE_OTHER): Payer: Medicare Other | Admitting: Family Medicine

## 2015-05-03 ENCOUNTER — Other Ambulatory Visit (HOSPITAL_COMMUNITY)
Admission: RE | Admit: 2015-05-03 | Discharge: 2015-05-03 | Disposition: A | Discharge status: Home / Self Care | Payer: Medicare Other | Source: Ambulatory Visit | Attending: Family Medicine | Admitting: Family Medicine

## 2015-05-03 ENCOUNTER — Ambulatory Visit (INDEPENDENT_AMBULATORY_CARE_PROVIDER_SITE_OTHER): Payer: Medicare Other | Admitting: *Deleted

## 2015-05-03 ENCOUNTER — Encounter: Payer: Self-pay | Admitting: Family Medicine

## 2015-05-03 VITALS — BP 115/93 | HR 86 | Temp 97.8°F | Ht 60.0 in | Wt 131.3 lb

## 2015-05-03 DIAGNOSIS — Z1151 Encounter for screening for human papillomavirus (HPV): Secondary | ICD-10-CM | POA: Insufficient documentation

## 2015-05-03 DIAGNOSIS — R002 Palpitations: Secondary | ICD-10-CM

## 2015-05-03 DIAGNOSIS — Z124 Encounter for screening for malignant neoplasm of cervix: Secondary | ICD-10-CM

## 2015-05-03 DIAGNOSIS — L900 Lichen sclerosus et atrophicus: Secondary | ICD-10-CM

## 2015-05-03 MED ORDER — CLOBETASOL PROPIONATE 0.05 % EX CREA: 1.0000 "application " | TOPICAL_CREAM | CUTANEOUS | Status: DC

## 2015-05-03 NOTE — Assessment & Plan Note (Signed)
Continue temovate 2x/week--explained where to put the medication for optimal effect.

## 2015-05-03 NOTE — Progress Notes (Signed)
    Subjective:    Patient ID: Autumn Fitzgerald is a 69 y.o. female presenting with Follow-up  on 05/03/2015  HPI: Here for f/u of her lichen sclerosis. Wants pap smear. Discussed that she does not need since age is > 65---but she would like it anyway.  Review of Systems  Constitutional: Negative for fever and chills.  Respiratory: Negative for shortness of breath.   Cardiovascular: Negative for chest pain.  Gastrointestinal: Negative for nausea, vomiting and abdominal pain.  Genitourinary: Negative for dysuria.  Skin: Negative for rash.      Objective:    BP 115/93 mmHg  Pulse 86  Temp(Src) 97.8 F (36.6 C)  Ht 5' (1.524 m)  Wt 131 lb 4.8 oz (59.557 kg)  BMI 25.64 kg/m2 Physical Exam  Constitutional: She is oriented to person, place, and time. She appears well-developed and well-nourished. No distress.  HENT:  Head: Normocephalic and atraumatic.  Eyes: No scleral icterus.  Neck: Neck supple.  Cardiovascular: Normal rate.   Pulmonary/Chest: Effort normal.  Abdominal: Soft.  Genitourinary:  BUS normal, vagina has white changes at urethra and posterior fourchette, cervix is flat and against vaginal wall, uterus is small and anteverted, no adnexal mass or tenderness.   Neurological: She is alert and oriented to person, place, and time.  Skin: Skin is warm and dry.  Psychiatric: She has a normal mood and affect.        Assessment & Plan:   Problem List Items Addressed This Visit      Unprioritized   Lichen sclerosus et atrophicus - Primary    Continue temovate 2x/week--explained where to put the medication for optimal effect.      Relevant Medications   clobetasol cream (TEMOVATE) 0.05 %    Other Visit Diagnoses    Screening for cervical cancer        Relevant Orders    Cytology - PAP        Return in about 6 months (around 11/03/2015).  Darene Nappi S 05/03/2015 1:53 PM

## 2015-05-03 NOTE — Patient Instructions (Signed)
Lichen Sclerosus Lichen sclerosus is a skin problem. It can happen on any part of the body, but it commonly involves the anal or genital areas. Lichen sclerosus is not an infection or a fungus. Girls and women are more commonly affected than boys and men. CAUSES The cause is not known. It could be the result of an overactive immune system or a lack of certain hormones. Lichen sclerosus is not passed from one person to another (not contagious). SYMPTOMS Your skin may have:  Thin, wrinkled, white areas.  Thickened white areas.  Red and swollen patches.  Tears or cracks.  Bruising.  Blood blisters.  Severe itching. You may also have pain, itching, or burning with urination. Constipation is also common in people with lichen sclerosus. DIAGNOSIS Your caregiver will do a physical exam. Sometimes, a tissue sample (biopsy) may be sent for testing. TREATMENT Treatment may involve putting a thin layer of medicated cream (topical steroid) over the areas with lichen sclerosus. Use the cream only as directed by your caregiver.  HOME CARE INSTRUCTIONS  Only take over-the-counter or prescription medicines as directed by your caregiver.  Keep the vaginal area as clean and dry as possible. SEEK MEDICAL CARE IF: You develop increasing pain, swelling, or redness. Document Released: 04/19/2011 Document Revised: 02/19/2012 Document Reviewed: 04/19/2011 ExitCare Patient Information 2015 [employer]. This information is not intended to replace advice given to you by your health care provider. Make sure you discuss any questions you have with your health care provider.  

## 2015-05-04 LAB — CUP PACEART REMOTE DEVICE CHECK
Date Time Interrogation Session: 20160428002208
Zone Setting Detection Interval: 2000 ms
Zone Setting Detection Interval: 3000 ms
Zone Setting Detection Interval: 370 ms

## 2015-05-04 LAB — CYTOLOGY - PAP

## 2015-05-04 NOTE — Progress Notes (Signed)
Loop recorder 

## 2015-05-07 ENCOUNTER — Other Ambulatory Visit (HOSPITAL_COMMUNITY): Payer: Self-pay | Admitting: Internal Medicine

## 2015-05-07 DIAGNOSIS — R609 Edema, unspecified: Secondary | ICD-10-CM

## 2015-05-12 ENCOUNTER — Ambulatory Visit (HOSPITAL_COMMUNITY)
Admission: RE | Admit: 2015-05-12 | Discharge: 2015-05-12 | Disposition: A | Discharge status: Home / Self Care | Payer: Medicare Other | Source: Ambulatory Visit | Attending: Internal Medicine | Admitting: Internal Medicine

## 2015-05-12 DIAGNOSIS — M7989 Other specified soft tissue disorders: Secondary | ICD-10-CM | POA: Diagnosis not present

## 2015-05-12 DIAGNOSIS — R609 Edema, unspecified: Secondary | ICD-10-CM | POA: Diagnosis not present

## 2015-05-12 NOTE — Progress Notes (Signed)
VASCULAR LAB PRELIMINARY  PRELIMINARY  PRELIMINARY  PRELIMINARY  Left lower extremity venous duplex completed.    Preliminary report:  Left:  No evidence of DVT, superficial thrombosis, or Baker's cyst.  [name], RVS 05/12/2015, 2:28 PM

## 2015-05-14 LAB — CUP PACEART REMOTE DEVICE CHECK: Date Time Interrogation Session: 20160603122328

## 2015-05-17 ENCOUNTER — Other Ambulatory Visit: Payer: Self-pay

## 2015-05-17 DIAGNOSIS — Z1231 Encounter for screening mammogram for malignant neoplasm of breast: Secondary | ICD-10-CM

## 2015-05-24 ENCOUNTER — Encounter: Payer: Self-pay | Admitting: Cardiovascular Disease

## 2015-06-01 ENCOUNTER — Ambulatory Visit (INDEPENDENT_AMBULATORY_CARE_PROVIDER_SITE_OTHER): Payer: Medicare Other | Admitting: *Deleted

## 2015-06-01 DIAGNOSIS — R002 Palpitations: Secondary | ICD-10-CM | POA: Diagnosis not present

## 2015-06-02 NOTE — Progress Notes (Signed)
Loop recorder 

## 2015-06-04 ENCOUNTER — Encounter: Payer: Self-pay | Admitting: Cardiovascular Disease

## 2015-06-07 DIAGNOSIS — J302 Other seasonal allergic rhinitis: Secondary | ICD-10-CM | POA: Diagnosis not present

## 2015-06-07 DIAGNOSIS — R7301 Impaired fasting glucose: Secondary | ICD-10-CM | POA: Diagnosis not present

## 2015-06-08 LAB — CUP PACEART REMOTE DEVICE CHECK: Date Time Interrogation Session: 20160628102548

## 2015-06-28 ENCOUNTER — Ambulatory Visit
Admission: RE | Admit: 2015-06-28 | Discharge: 2015-06-28 | Disposition: A | Discharge status: Home / Self Care | Payer: Medicare Other | Source: Ambulatory Visit

## 2015-06-28 DIAGNOSIS — Z1231 Encounter for screening mammogram for malignant neoplasm of breast: Secondary | ICD-10-CM

## 2015-06-29 ENCOUNTER — Ambulatory Visit: Payer: Medicare Other | Attending: Internal Medicine

## 2015-06-29 DIAGNOSIS — M436 Torticollis: Secondary | ICD-10-CM | POA: Insufficient documentation

## 2015-06-29 DIAGNOSIS — M542 Cervicalgia: Secondary | ICD-10-CM | POA: Insufficient documentation

## 2015-06-29 DIAGNOSIS — M545 Low back pain: Secondary | ICD-10-CM | POA: Diagnosis not present

## 2015-06-29 DIAGNOSIS — R293 Abnormal posture: Secondary | ICD-10-CM | POA: Diagnosis not present

## 2015-06-29 DIAGNOSIS — M2560 Stiffness of unspecified joint, not elsewhere classified: Secondary | ICD-10-CM | POA: Insufficient documentation

## 2015-06-29 DIAGNOSIS — R6889 Other general symptoms and signs: Secondary | ICD-10-CM | POA: Diagnosis not present

## 2015-06-29 NOTE — Patient Instructions (Signed)
From cabinet LTR, knee to chest, bridge, cross postural exercises 2-3x/day , 30 sec stretch 2-3 reps RT and LT and 10-15 bridges

## 2015-06-29 NOTE — Therapy (Signed)
[place], [place], [address] Phone: [phone]   Fax:  [phone]  Physical Therapy Evaluation  Patient Details  Name: Autumn Fitzgerald MRN: [id number] Date of Birth: Jun 22, 1946 Referring Provider:  Nolene Ebbs, MD  Encounter Date: 06/29/2015      PT End of Session - 06/29/15 1503    Visit Number 1   Number of Visits 12   Date for PT Re-Evaluation 08/10/15   PT Start Time 0300   PT Stop Time 0400   PT Time Calculation (min) 60 min      Past Medical History  Diagnosis Date  . Panic attacks   . 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 GERD (gastroesophageal reflux disease)   . HLD (hyperlipidemia)   . Asthma   . Palpitations   . Near syncope   . Chest wall pain   . Hypertension     at times,  . Mental disorder   . Depression   . Head injury, closed, without LOC 02/2013    did not LOC per patient  . Arthritis   . Hyperthyroidism     Past Surgical History  Procedure Laterality Date  . 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 Tubal ligation    . Cholecystectomy    . Loop recorder implant  03/18/2013  . Septal myectomy    . Nuclear stress test  11/30/2011    No ischemia  . Doppler echocardiography  03/14/2013    EF 65-70%,trivial AI,mild-mod. MR,Mod. TR,mod. concentric hypertrophy  . Knee arthroscopy Right 10/17/2013    Procedure: ARTHROSCOPY RIGHT KNEE;  Surgeon: [name], MD;  Location: [facility];  Service: Orthopedics;  Laterality: Right;  . Esophagogastroduodenoscopy N/A 11/24/2013    Procedure: ESOPHAGOGASTRODUODENOSCOPY (EGD);  Surgeon: Juanita Craver, MD;  Location: [facility];  Service: Endoscopy;  Laterality: N/A;  . Colonoscopy N/A 11/24/2013    Procedure: COLONOSCOPY;  Surgeon: Juanita Craver, MD;  Location: [facility];  Service: Endoscopy;  Laterality: N/A;  . Loop recorder implant N/A 03/18/2013    Procedure: LOOP RECORDER IMPLANT;  Surgeon: Sanda Klein, MD;   Location: [facility];  Service: Cardiovascular;  Laterality: N/A;    There were no vitals filed for this visit.  Visit Diagnosis:  Bilateral low back pain, with sciatica presence unspecified - Plan: PT plan of care cert/re-cert  Neck pain, bilateral posterior - Plan: PT plan of care cert/re-cert  Stiffness of neck - Plan: PT plan of care cert/re-cert  Joint stiffness of spine - Plan: PT plan of care cert/re-cert  Abnormal posture - Plan: PT plan of care cert/re-cert  Activity intolerance - Plan: PT plan of care cert/re-cert      Subjective Assessment - 06/29/15 1503    Subjective Lower back and bilateral leg pain and pain in neck. She reports pain started with pushing heavy person in chair. Pain worse with moving in May 2016. She had pain prior to moving but not in legs. . she had pain in legs before.    Limitations --  Hard to walk distances., Home tasks, bending  getting off floor   How long can you sit comfortably? 120 min   How long can you stand comfortably? 30 min   How long can you walk comfortably? She is only walking in home   Diagnostic tests None   Patient Stated Goals Decrease pain   Currently in Pain? Yes   Pain Score 8    Pain Location Back  Pain Orientation Right;Left   Pain Descriptors / Indicators --  " just a pain   Pain Radiating Towards both LE   Pain Onset More than a month ago   Pain Frequency Constant   Aggravating Factors  Activity   Pain Relieving Factors lying, tylenol   Multiple Pain Sites Yes   Pain Score 8   Pain Location Neck   Pain Orientation Right;Left;Posterior   Pain Radiating Towards shoulders   Pain Onset More than a month ago   Pain Frequency Constant   Aggravating Factors  bending   Effect of Pain on Daily Activities limits ADLs            [facility] PT Assessment - 06/29/15 1514    Assessment   Medical Diagnosis lumbago   Onset Date/Surgical Date --  04/2015   Prior Therapy PT for back and neck   Precautions    Precautions None;Other (comment)   Precaution Comments She reported in chest a device that monitors her heart : no electrical current   Restrictions   Weight Bearing Restrictions No   Balance Screen   Has the patient fallen in the past 6 months No   Prior Function   Level of Independence Independent   Cognition   Overall Cognitive Status Within Functional Limits for tasks assessed   Observation/Other Assessments   Focus on Therapeutic Outcomes (FOTO)  69%   Posture/Postural Control   Posture Comments rounded shoulders mild forward head.    ROM / Strength   AROM / PROM / Strength AROM;Strength   AROM   Overall AROM Comments LE range WNL   AROM Assessment Site Cervical;Lumbar   Cervical Flexion 40   Cervical Extension 52   Cervical - Right Side Bend 35   Cervical - Left Side Bend 35   Cervical - Right Rotation 70   Cervical - Left Rotation 67   Lumbar Flexion She can touch her toes   Lumbar Extension 20   Lumbar - Right Side Bend 15   Lumbar - Left Side Bend 15   Lumbar - Right Rotation decr 25% supine   Lumbar - Left Rotation decr 25% supine   Strength   Overall Strength Comments Both UE and LE WNL,  abdominals poor.    Flexibility   Soft Tissue Assessment /Muscle Length yes   Hamstrings 70 degrees bilaterally   Palpation   Palpation comment tender in paraspinals.    Transfers   Comments Independent   Ambulation/Gait   Gait Comments Normal                   [facility] Adult PT Treatment/Exercise - 06/29/15 1514    Exercises   Exercises Lumbar;Neck   Modalities   Modalities Moist Heat   Moist Heat Therapy   Number Minutes Moist Heat 20 Minutes   Moist Heat Location Cervical;Lumbar Spine                PT Education - 06/29/15 1608    Education provided Yes   Education Details POC and HEP   Person(s) Educated Patient   Methods Demonstration;Explanation;Tactile cues;Verbal cues;Handout   Comprehension Returned demonstration;Verbalized understanding           PT Short Term Goals - 06/29/15 1612    PT SHORT TERM GOAL #1   Title She will be independent with inital HEP   Time 4   Period Weeks   Status New   PT SHORT TERM GOAL #2   Title She will  report pain improved 30% or more   Time 4   Period Weeks   Status New   PT SHORT TERM GOAL #3   Title Seh will demo understanding og good posture and mechanics   Time 4   Period Weeks   Status New   PT SHORT TERM GOAL #4   Title She will report beiing able to walk to store with no significant incr pain.    Time 4   Period Weeks   Status New           PT Long Term Goals - Jul 13, 2015 1613    PT LONG TERM GOAL #1   Title she will be independent with all hEP issued as of last visit   Time 8   Period Weeks   Status New   PT LONG TERM GOAL #2   Title She will improve FOTO score to < 50% to indicate improved function.   Time 8   Period Weeks   Status New   PT LONG TERM GOAL #3   Title She will report she is able to do home tasks with 50% less pain   Time 8   Period Weeks   Status New   PT LONG TERM GOAL #4   Title She will report general decrease in pain to pre flare up level of pain.    Time 8   Period Weeks   Status New   PT LONG TERM GOAL #5   Title She will report leg pain improved 75%  with walking and home tasks   Time 8   Period Weeks   Status New               Plan - 2015/07/13 1608    Clinical Impression Statement Ms Milbrath reports high pain levels but does not appear to be in any significant by affect and movement. She does have end range pain and poor abdominal strength., forward head posture, , stiffness of neck and back. She should improve with PT but she had pain prior to onset of incr pain with moving.    Rehab Potential Good   PT Frequency 2x / week   PT Duration 8 weeks   PT Treatment/Interventions Moist Heat;Therapeutic exercise;Patient/family education;Manual techniques;Taping;Dry needling;Passive range of motion  No electric stim as she  described a device that monitored her heart in chest   PT Next Visit Plan Review HEP and add. HMP, manual to back and neck   PT Home Exercise Plan Stabilization and stretching   Consulted and Agree with Plan of Care Patient          G-Codes - 13-Jul-2015 1604    Functional Assessment Tool Used FOTO   Functional Limitation Other PT primary   Other PT Primary Current Status (O3500) At least 60 percent but less than 80 percent impaired, limited or restricted   Other PT Primary Goal Status (X3818) At least 40 percent but less than 60 percent impaired, limited or restricted       Problem List Patient Active Problem List   Diagnosis Date Noted  . Lichen sclerosus et atrophicus 06/18/2014  . Benign gastric polyp 02/20/2014  . Degenerative joint disease involving multiple joints 09/10/2013  . 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 LBBB (left bundle branch block) 07/23/2013  . Subclinical hypothyroidism 05/14/2012  . Lower abdominal pain 01/10/2012  . Panic attacks   . GERD (gastroesophageal reflux disease)   . HLD (hyperlipidemia)   . Asthma   . HTN (  hypertension)   . Palpitations   . Allergic rhinitis 02/24/2011  . Anxiety, generalized 02/24/2011  . Chronic recurrent major depressive disorder 02/24/2011    Darrel Hoover PT 06/29/2015, 4:20 PM  [facility] [address] [place], [place], [address] Phone: [phone]   Fax:  [phone]

## 2015-07-01 ENCOUNTER — Ambulatory Visit (INDEPENDENT_AMBULATORY_CARE_PROVIDER_SITE_OTHER): Payer: Medicare Other

## 2015-07-01 DIAGNOSIS — R002 Palpitations: Secondary | ICD-10-CM | POA: Diagnosis not present

## 2015-07-06 ENCOUNTER — Ambulatory Visit: Payer: Medicare Other | Admitting: Physical Therapy

## 2015-07-06 DIAGNOSIS — M545 Low back pain: Secondary | ICD-10-CM | POA: Diagnosis not present

## 2015-07-06 DIAGNOSIS — M436 Torticollis: Secondary | ICD-10-CM

## 2015-07-06 DIAGNOSIS — M2560 Stiffness of unspecified joint, not elsewhere classified: Secondary | ICD-10-CM

## 2015-07-06 DIAGNOSIS — R293 Abnormal posture: Secondary | ICD-10-CM

## 2015-07-06 DIAGNOSIS — M542 Cervicalgia: Secondary | ICD-10-CM

## 2015-07-06 NOTE — Patient Instructions (Signed)
Over Head Pull: Narrow Grip       On back, knees bent, feet flat, band across thighs, elbows straight but relaxed. Pull hands apart (start). Keeping elbows straight, bring arms up and over head, hands toward floor. Keep pull steady on band. Hold momentarily. Return slowly, keeping pull steady, back to start. Repeat _10__ times. Band color yellow_____   Side Pull: Double Arm   On back, knees bent, feet flat. Arms perpendicular to body, shoulder level, elbows straight but relaxed. Pull arms out to sides, elbows straight. Resistance band comes across collarbones, hands toward floor. Hold momentarily. Slowly return to starting position. Repeat _10 to 30__ times. Band color _yellow____   Sash   On back, knees bent, feet flat, left hand on left hip, right hand above left. Pull right arm DIAGONALLY (hip to shoulder) across chest. Bring right arm along head toward floor. Hold momentarily. Slowly return to starting position. Repeat10 to 30 ___ times. Do with left arm. Band color __yellow____   Shoulder Rotation: Double Arm   On back, knees bent, feet flat, elbows tucked at sides, bent 90, hands palms up. Pull hands apart and down toward floor, keeping elbows near sides. Hold momentarily. Slowly return to starting position. Repeat __10- 30_ times. Band color ______  yelloe

## 2015-07-06 NOTE — Therapy (Signed)
[place], [place], [address] Phone: [phone]   Fax:  [phone]  Physical Therapy Treatment  Patient Details  Name: Autumn Fitzgerald MRN: [id number] Date of Birth: 06/17/1946 Referring Provider:  Nolene Ebbs, MD  Encounter Date: 07/06/2015      PT End of Session - 07/06/15 1503    Visit Number 2   Number of Visits 12   Date for PT Re-Evaluation 08/10/15   PT Start Time 9371   PT Stop Time 1500   PT Time Calculation (min) 45 min   Activity Tolerance Patient tolerated treatment well      Past Medical History  Diagnosis Date  . Panic attacks   . 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 GERD (gastroesophageal reflux disease)   . HLD (hyperlipidemia)   . Asthma   . Palpitations   . Near syncope   . Chest wall pain   . Hypertension     at times,  . Mental disorder   . Depression   . Head injury, closed, without LOC 02/2013    did not LOC per patient  . Arthritis   . Hyperthyroidism     Past Surgical History  Procedure Laterality Date  . 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 Tubal ligation    . Cholecystectomy    . Loop recorder implant  03/18/2013  . Septal myectomy    . Nuclear stress test  11/30/2011    No ischemia  . Doppler echocardiography  03/14/2013    EF 65-70%,trivial AI,mild-mod. MR,Mod. TR,mod. concentric hypertrophy  . Knee arthroscopy Right 10/17/2013    Procedure: ARTHROSCOPY RIGHT KNEE;  Surgeon: [name], MD;  Location: [place];  Service: Orthopedics;  Laterality: Right;  . Esophagogastroduodenoscopy N/A 11/24/2013    Procedure: ESOPHAGOGASTRODUODENOSCOPY (EGD);  Surgeon: Juanita Craver, MD;  Location: [facility];  Service: Endoscopy;  Laterality: N/A;  . Colonoscopy N/A 11/24/2013    Procedure: COLONOSCOPY;  Surgeon: Juanita Craver, MD;  Location: [facility];  Service: Endoscopy;  Laterality: N/A;  . Loop recorder implant N/A 03/18/2013    Procedure:  LOOP RECORDER IMPLANT;  Surgeon: Sanda Klein, MD;  Location: [place] [facility];  Service: Cardiovascular;  Laterality: N/A;    There were no vitals filed for this visit.  Visit Diagnosis:  Neck pain, bilateral posterior  Bilateral low back pain, with sciatica presence unspecified  Stiffness of neck  Joint stiffness of spine  Abnormal posture      Subjective Assessment - 07/06/15 1420    Subjective 7/10 shoulders .     Currently in Pain? Yes   Pain Score 7    Pain Location Back   Pain Orientation Right;Left   Pain Descriptors / Indicators Aching   Pain Frequency Constant   Aggravating Factors  activity   Pain Relieving Factors lying down, warm shower, tylenol   Multiple Pain Sites Yes  Neck                         [facility] Adult PT Treatment/Exercise - 07/06/15 1428    Neck Exercises: Seated   Cervical Rotation 10 reps   Lateral Flexion 5 reps  with a little rotation, using lumbar support.   Other Seated Exercise Deep neck flexor 5second hold 5 reps.   Neck Exercises: Supine   Other Supine Exercise Scapular scapular stabilization 10 reps, yellow band.    flexion narrow grip, ER, Horizontal pull, sash 10 reps each  Lumbar Exercises: Stretches   Single Knee to Chest Stretch --  10 reps 5 seconds    Lower Trunk Rotation 5 reps  10 seconds   Lumbar Exercises: Supine   Bridge 10 reps   Manual Therapy   Manual therapy comments soft tissue work neck , levator scapula softened.                  PT Education - 07/06/15 1503    Education provided Yes   Education Details lumbar scapular stabilization   Person(s) Educated Patient   Methods Explanation;Demonstration;Tactile cues;Verbal cues;Handout   Comprehension Verbalized understanding          PT Short Term Goals - 06/29/15 1612    PT SHORT TERM GOAL #1   Title She will be independent with inital HEP   Time 4   Period Weeks   Status New   PT SHORT TERM GOAL #2   Title She will report  pain improved 30% or more   Time 4   Period Weeks   Status New   PT SHORT TERM GOAL #3   Title Seh will demo understanding og good posture and mechanics   Time 4   Period Weeks   Status New   PT SHORT TERM GOAL #4   Title She will report beiing able to walk to store with no significant incr pain.    Time 4   Period Weeks   Status New           PT Long Term Goals - 06/29/15 1613    PT LONG TERM GOAL #1   Title she will be independent with all hEP issued as of last visit   Time 8   Period Weeks   Status New   PT LONG TERM GOAL #2   Title She will improve FOTO score to < 50% to indicate improved function.   Time 8   Period Weeks   Status New   PT LONG TERM GOAL #3   Title She will report she is able to do home tasks with 50% less pain   Time 8   Period Weeks   Status New   PT LONG TERM GOAL #4   Title She will report general decrease in pain to pre flare up level of pain.    Time 8   Period Weeks   Status New   PT LONG TERM GOAL #5   Title She will report leg pain improved 75%  with walking and home tasks   Time 8   Period Weeks   Status New               Plan - 07/06/15 1504    Clinical Impression Statement patient could remer most of her exercises by heart.  Progress toward home ex goal.     PT Next Visit Plan review scapular stabilization.  Add lumbar stabilization   Consulted and Agree with Plan of Care Patient        Problem List Patient Active Problem List   Diagnosis Date Noted  . Lichen sclerosus et atrophicus 06/18/2014  . Benign gastric polyp 02/20/2014  . Degenerative joint disease involving multiple joints 09/10/2013  . 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 LBBB (left bundle branch block) 07/23/2013  . Subclinical hypothyroidism 05/14/2012  . Lower abdominal pain 01/10/2012  . Panic attacks   . GERD (gastroesophageal reflux disease)   . HLD (hyperlipidemia)   . Asthma   . HTN (hypertension)   .  Palpitations   . Allergic rhinitis 02/24/2011  . Anxiety, generalized 02/24/2011  . Chronic recurrent major depressive disorder 02/24/2011    [facility] 07/06/2015, 3:06 PM  [facility] [address] [place], [place], [address] Phone: [phone]   Fax:  [phone]     Melvenia Needles, PTA 07/06/2015 3:06 PM Phone: [phone] Fax: [phone]

## 2015-07-14 NOTE — Progress Notes (Signed)
Loop recorder 

## 2015-07-20 ENCOUNTER — Ambulatory Visit: Payer: Medicare Other | Attending: Internal Medicine | Admitting: Physical Therapy

## 2015-07-20 DIAGNOSIS — M545 Low back pain: Secondary | ICD-10-CM | POA: Insufficient documentation

## 2015-07-20 DIAGNOSIS — M542 Cervicalgia: Secondary | ICD-10-CM | POA: Diagnosis not present

## 2015-07-20 DIAGNOSIS — M436 Torticollis: Secondary | ICD-10-CM | POA: Diagnosis not present

## 2015-07-20 DIAGNOSIS — R293 Abnormal posture: Secondary | ICD-10-CM | POA: Diagnosis not present

## 2015-07-20 DIAGNOSIS — M2560 Stiffness of unspecified joint, not elsewhere classified: Secondary | ICD-10-CM

## 2015-07-20 DIAGNOSIS — R6889 Other general symptoms and signs: Secondary | ICD-10-CM

## 2015-07-20 NOTE — Therapy (Signed)
[place], [place], [address] Phone: [phone]   Fax:  [phone]  Physical Therapy Treatment  Patient Details  Name: Autumn Fitzgerald MRN: [id number] Date of Birth: October 08, 1946 Referring Provider:  Nolene Ebbs, MD  Encounter Date: 07/20/2015      PT End of Session - 07/20/15 1457    Visit Number 3   Number of Visits 12   Date for PT Re-Evaluation 08/10/15   PT Start Time 2951   PT Stop Time 1458   PT Time Calculation (min) 40 min   Activity Tolerance Patient tolerated treatment well;No increased pain   Behavior During Therapy [facility] for tasks assessed/performed      Past Medical History  Diagnosis Date  . Panic attacks   . 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 GERD (gastroesophageal reflux disease)   . HLD (hyperlipidemia)   . Asthma   . Palpitations   . Near syncope   . Chest wall pain   . Hypertension     at times,  . Mental disorder   . Depression   . Head injury, closed, without LOC 02/2013    did not LOC per patient  . Arthritis   . Hyperthyroidism     Past Surgical History  Procedure Laterality Date  . 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 Tubal ligation    . Cholecystectomy    . Loop recorder implant  03/18/2013  . Septal myectomy    . Nuclear stress test  11/30/2011    No ischemia  . Doppler echocardiography  03/14/2013    EF 65-70%,trivial AI,mild-mod. MR,Mod. TR,mod. concentric hypertrophy  . Knee arthroscopy Right 10/17/2013    Procedure: ARTHROSCOPY RIGHT KNEE;  Surgeon: [name], MD;  Location: [place] [facility];  Service: Orthopedics;  Laterality: Right;  . Esophagogastroduodenoscopy N/A 11/24/2013    Procedure: ESOPHAGOGASTRODUODENOSCOPY (EGD);  Surgeon: Juanita Craver, MD;  [facility];  Service: Endoscopy;  Laterality: N/A;  . Colonoscopy N/A 11/24/2013    Procedure: COLONOSCOPY;  Surgeon: Juanita Craver, MD;  Location[facility];  Service:  Endoscopy;  Laterality: N/A;  . Loop recorder implant N/A 03/18/2013    Procedure: LOOP RECORDER IMPLANT;  Surgeon: Sanda Klein, MD;  Location: [place] [facility];  Service: Cardiovascular;  Laterality: N/A;    There were no vitals filed for this visit.  Visit Diagnosis:  Neck pain, bilateral posterior  Bilateral low back pain, with sciatica presence unspecified  Stiffness of neck  Joint stiffness of spine  Abnormal posture  Activity intolerance      Subjective Assessment - 07/20/15 1424    Subjective Hurt her back squat to pick up a hair.., Severe pain.  She uses Ice and Exercises and it went away.  She brought her home exercises.     Currently in Pain? Yes   Pain Score 5    Pain Location Back   Pain Orientation Mid;Right;Left   Pain Descriptors / Indicators --  mild   Pain Frequency Constant   Pain Relieving Factors lying down, ice,                         [facility] Adult PT Treatment/Exercise - 07/20/15 1429    Neck Exercises: Seated   Other Seated Exercise chin tucs, scapular squeeze 10 Xcould not coordinate to do together.   Other Seated Exercise upper trap, levator stretches 3 reps, 30 seconds each, cues   Neck Exercises: Supine   Other  Supine Exercise Scapular scapular stabilization 10 reps, yellow band.    flexion narrow grip, ER, Horizontal pull, sash 10 reps each,   Lumbar Exercises: Stretches   Single Knee to Chest Stretch 3 reps;30 seconds  cues for holding   Lower Trunk Rotation 3 reps;30 seconds   Lumbar Exercises: Seated   Hip Flexion on Ball Limitations sitting with good posture for X to V, opposite knee touch fron shoulders 90 degrees, marching 10 X    Lumbar Exercises: Supine   Bridge 10 reps                  PT Short Term Goals - 07/20/15 1459    PT SHORT TERM GOAL #1   Title She will be independent with inital HEP   Baseline cued needed   Time 4   Period Weeks   Status On-going   PT SHORT TERM GOAL #2   Title She will  report pain improved 30% or more   Time 4   Period Weeks   Status On-going   PT SHORT TERM GOAL #3   Title Seh will demo understanding og good posture and mechanics   Baseline demonstrates good sitting posture without cues   Time 4   Period Weeks   Status Partially Met   PT SHORT TERM GOAL #4   Title She will report beiing able to walk to store with no significant incr pain.    Time 4   Period Weeks   Status Unable to assess           PT Long Term Goals - 06/29/15 1613    PT LONG TERM GOAL #1   Title she will be independent with all hEP issued as of last visit   Time 8   Period Weeks   Status New   PT LONG TERM GOAL #2   Title She will improve FOTO score to < 50% to indicate improved function.   Time 8   Period Weeks   Status New   PT LONG TERM GOAL #3   Title She will report she is able to do home tasks with 50% less pain   Time 8   Period Weeks   Status New   PT LONG TERM GOAL #4   Title She will report general decrease in pain to pre flare up level of pain.    Time 8   Period Weeks   Status New   PT LONG TERM GOAL #5   Title She will report leg pain improved 75%  with walking and home tasks   Time 8   Period Weeks   Status New               Plan - 07/20/15 1458    Clinical Impression Statement rewiewed home ex from last 2 visits patient continues to need cues on some.  Progress toward home xercise goals.   PT Next Visit Plan Add lumbar stabilization. sit or supine   Consulted and Agree with Plan of Care Patient        Problem List Patient Active Problem List   Diagnosis Date Noted  . Lichen sclerosus et atrophicus 06/18/2014  . Benign gastric polyp 02/20/2014  . Degenerative joint disease involving multiple joints 09/10/2013  . 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 LBBB (left bundle branch block) 07/23/2013  . Subclinical hypothyroidism 05/14/2012  . Lower abdominal pain 01/10/2012  . Panic attacks   . GERD  (gastroesophageal reflux disease)   .  HLD (hyperlipidemia)   . Asthma   . HTN (hypertension)   . Palpitations   . Allergic rhinitis 02/24/2011  . Anxiety, generalized 02/24/2011  . Chronic recurrent major depressive disorder 02/24/2011    [facility] 07/20/2015, 3:01 PM  [facility] [address] [place], [place], [address] Phone: [phone]   Fax:  [phone]     Melvenia Needles, PTA 07/20/2015 3:01 PM Phone: [phone] Fax: [phone]

## 2015-07-22 ENCOUNTER — Ambulatory Visit: Payer: Medicare Other | Admitting: Physical Therapy

## 2015-07-22 DIAGNOSIS — M542 Cervicalgia: Secondary | ICD-10-CM

## 2015-07-22 DIAGNOSIS — M2560 Stiffness of unspecified joint, not elsewhere classified: Secondary | ICD-10-CM

## 2015-07-22 DIAGNOSIS — R293 Abnormal posture: Secondary | ICD-10-CM

## 2015-07-22 DIAGNOSIS — M436 Torticollis: Secondary | ICD-10-CM

## 2015-07-22 DIAGNOSIS — R6889 Other general symptoms and signs: Secondary | ICD-10-CM

## 2015-07-22 DIAGNOSIS — M545 Low back pain: Secondary | ICD-10-CM

## 2015-07-22 NOTE — Therapy (Signed)
[place], [place], [address] Phone: [phone]   Fax:  [phone]  Physical Therapy Treatment  Patient Details  Name: Autumn Fitzgerald MRN: [id number] Date of Birth: September 20, 1946 Referring Provider:  Nolene Ebbs, MD  Encounter Date: 07/22/2015      PT End of Session - 07/22/15 1506    Visit Number 4   Number of Visits 12   Date for PT Re-Evaluation 08/10/15   PT Start Time 1749   PT Stop Time 1500   PT Time Calculation (min) 45 min   Activity Tolerance Patient tolerated treatment well   Behavior During Therapy [facility] for tasks assessed/performed      Past Medical History  Diagnosis Date  . Panic attacks   . 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 GERD (gastroesophageal reflux disease)   . HLD (hyperlipidemia)   . Asthma   . Palpitations   . Near syncope   . Chest wall pain   . Hypertension     at times,  . Mental disorder   . Depression   . Head injury, closed, without LOC 02/2013    did not LOC per patient  . Arthritis   . Hyperthyroidism     Past Surgical History  Procedure Laterality Date  . 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 Tubal ligation    . Cholecystectomy    . Loop recorder implant  03/18/2013  . Septal myectomy    . Nuclear stress test  11/30/2011    No ischemia  . Doppler echocardiography  03/14/2013    EF 65-70%,trivial AI,mild-mod. MR,Mod. TR,mod. concentric hypertrophy  . Knee arthroscopy Right 10/17/2013    Procedure: ARTHROSCOPY RIGHT KNEE;  Surgeon: [name], MD;  Location: [facility];  Service: Orthopedics;  Laterality: Right;  . Esophagogastroduodenoscopy N/A 11/24/2013    Procedure: ESOPHAGOGASTRODUODENOSCOPY (EGD);  Surgeon: Juanita Craver, MD;  Location: [facility];  Service: Endoscopy;  Laterality: N/A;  . Colonoscopy N/A 11/24/2013    Procedure: COLONOSCOPY;  Surgeon: Juanita Craver, MD;  Location: [facility];  Service: Endoscopy;  Laterality:  N/A;  . Loop recorder implant N/A 03/18/2013    Procedure: LOOP RECORDER IMPLANT;  Surgeon: Sanda Klein, MD;  Location: [facility];  Service: Cardiovascular;  Laterality: N/A;    There were no vitals filed for this visit.  Visit Diagnosis:  Neck pain, bilateral posterior  Bilateral low back pain, with sciatica presence unspecified  Stiffness of neck  Joint stiffness of spine  Abnormal posture  Activity intolerance      Subjective Assessment - 07/22/15 1448    Subjective Shoulder 6/10 LT, Knee LT knee 7/10.  Has been doing her exercises at home.  She has LT leg numbness it has been going on a long time.  Worse donning shoes.  Better in bed                         [facility] Adult PT Treatment/Exercise - 07/22/15 1432    Neck Exercises: Standing   Wall Push Ups 10 reps   Wall Push Ups Limitations 7/10 pain   Other Standing Exercises Lt shoulder 1 arm doorway stretch 3 reps 15-20 second holds.   Neck Exercises: Supine   Other Supine Exercise deep neck flexion 5 second holds, 10 times.   Other Supine Exercise Scapular stabilization series as on 07/20/2015 10 reps each fewer cued needed.  has hand arthritis pain with tight pulls  Lumbar Exercises: Stretches   Single Knee to Chest Stretch 5 reps   Lower Trunk Rotation 5 reps   Lumbar Exercises: Sidelying   Clam 10 reps   Hip Abduction 10 reps                  PT Short Term Goals - 07/20/15 1459    PT SHORT TERM GOAL #1   Title She will be independent with inital HEP   Baseline cued needed   Time 4   Period Weeks   Status On-going   PT SHORT TERM GOAL #2   Title She will report pain improved 30% or more   Time 4   Period Weeks   Status On-going   PT SHORT TERM GOAL #3   Title Seh will demo understanding og good posture and mechanics   Baseline demonstrates good sitting posture without cues   Time 4   Period Weeks   Status Partially Met   PT SHORT TERM GOAL #4   Title She will report  beiing able to walk to store with no significant incr pain.    Time 4   Period Weeks   Status Unable to assess           PT Long Term Goals - 06/29/15 1613    PT LONG TERM GOAL #1   Title she will be independent with all hEP issued as of last visit   Time 8   Period Weeks   Status New   PT LONG TERM GOAL #2   Title She will improve FOTO score to < 50% to indicate improved function.   Time 8   Period Weeks   Status New   PT LONG TERM GOAL #3   Title She will report she is able to do home tasks with 50% less pain   Time 8   Period Weeks   Status New   PT LONG TERM GOAL #4   Title She will report general decrease in pain to pre flare up level of pain.    Time 8   Period Weeks   Status New   PT LONG TERM GOAL #5   Title She will report leg pain improved 75%  with walking and home tasks   Time 8   Period Weeks   Status New               Plan - 07/22/15 1507    Clinical Impression Statement 4/10 shoulder pain post wall push up.  Fewer cues needed with scapular posture stabilization exercises.   PT Next Visit Plan Add lumbar stabilization. sit or supine  progress home exercise.   Consulted and Agree with Plan of Care Patient        Problem List Patient Active Problem List   Diagnosis Date Noted  . Lichen sclerosus et atrophicus 06/18/2014  . Benign gastric polyp 02/20/2014  . Degenerative joint disease involving multiple joints 09/10/2013  . 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 LBBB (left bundle branch block) 07/23/2013  . Subclinical hypothyroidism 05/14/2012  . Lower abdominal pain 01/10/2012  . Panic attacks   . GERD (gastroesophageal reflux disease)   . HLD (hyperlipidemia)   . Asthma   . HTN (hypertension)   . Palpitations   . Allergic rhinitis 02/24/2011  . Anxiety, generalized 02/24/2011  . Chronic recurrent major depressive disorder 02/24/2011    [facility] 07/22/2015, 3:10 PM  [facility] [address] [place], [place], [address]  Phone: [phone]   Fax:  [phone]     Melvenia Needles, PTA 07/22/2015 3:10 PM Phone: [phone] Fax: [phone]

## 2015-07-23 LAB — CUP PACEART REMOTE DEVICE CHECK: Date Time Interrogation Session: 20160812153226

## 2015-07-26 ENCOUNTER — Encounter: Payer: Self-pay | Admitting: Cardiovascular Disease

## 2015-07-26 ENCOUNTER — Ambulatory Visit (INDEPENDENT_AMBULATORY_CARE_PROVIDER_SITE_OTHER): Payer: Medicare Other | Admitting: Cardiovascular Disease

## 2015-07-26 VITALS — BP 110/68 | HR 85 | Ht 60.0 in | Wt 130.0 lb

## 2015-07-26 DIAGNOSIS — I421 Obstructive hypertrophic cardiomyopathy: Secondary | ICD-10-CM

## 2015-07-26 DIAGNOSIS — I447 Left bundle-branch block, unspecified: Secondary | ICD-10-CM

## 2015-07-26 NOTE — Progress Notes (Signed)
Patient ID: Autumn Fitzgerald, female   DOB: 1946/03/31, 69 y.o.   MRN: [id number]     Cardiology Office Note   Date:  07/27/2015   ID:  Autumn Fitzgerald, DOB [birth year], MRN [id number]  PCP:  Philis Fendt, MD  Cardiologist:   Sanda Klein, MD   Chief Complaint  Patient presents with  . 6 MONTHS    patinet has swelling in her left leg.      History of Present Illness: Autumn Fitzgerald is a 69 y.o. female who presents for follow-up for history of hypertrophic obstructive cardiomyopathy status post successful septal myectomy, chronic left bundle branch block and a remote history of near syncope. Loop recorder was implanted a couple of years ago. It has never recorded significant arrhythmia and she has not had recurrent near syncope since.  As always, most of her clinical complaint centers around symptoms of depression. She denies exertional dyspnea, exertional angina, near-syncope or syncope and is rarely troubled by palpitations. She describes occasional asymmetrical swelling of her left ankle, none of it is apparent today.    Past Medical History  Diagnosis Date  . Panic attacks   . 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 GERD (gastroesophageal reflux disease)   . HLD (hyperlipidemia)   . Asthma   . Palpitations   . Near syncope   . Chest wall pain   . Hypertension     at times,  . Mental disorder   . Depression   . Head injury, closed, without LOC 02/2013    did not LOC per patient  . Arthritis   . Hyperthyroidism     Past Surgical History  Procedure Laterality Date  . 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 Tubal ligation    . Cholecystectomy    . Loop recorder implant  03/18/2013  . Septal myectomy    . Nuclear stress test  11/30/2011    No ischemia  . Doppler echocardiography  03/14/2013    EF 65-70%,trivial AI,mild-mod. MR,Mod. TR,mod. concentric hypertrophy  . Knee arthroscopy Right 10/17/2013    Procedure: ARTHROSCOPY RIGHT KNEE;  Surgeon: [name], MD;  Location: [facility];  Service: Orthopedics;  Laterality: Right;  . Esophagogastroduodenoscopy N/A 11/24/2013    Procedure: ESOPHAGOGASTRODUODENOSCOPY (EGD);  Surgeon: Juanita Craver, MD;  Location: [facility];  Service: Endoscopy;  Laterality: N/A;  . Colonoscopy N/A 11/24/2013    Procedure: COLONOSCOPY;  Surgeon: Juanita Craver, MD;  Location: [facility];  Service: Endoscopy;  Laterality: N/A;  . Loop recorder implant N/A 03/18/2013    Procedure: LOOP RECORDER IMPLANT;  Surgeon: Sanda Klein, MD;  Location: [facility];  Service: Cardiovascular;  Laterality: N/A;     Current Outpatient Prescriptions  Medication Sig Dispense Refill  . albuterol (PROVENTIL HFA;VENTOLIN HFA) 108 (90 BASE) MCG/ACT inhaler Inhale 2 puffs into the lungs every 6 (six) hours as needed for wheezing or shortness of breath.    Marland Kitchen aspirin 81 MG tablet Take 81 mg by mouth daily.    Marland Kitchen atenolol (TENORMIN) 25 MG tablet Take 0.5 tablets (12.5 mg total) by mouth daily as needed (for high blood pressure). 45 tablet 3  . Cholecalciferol (VITAMIN D-3) 5000 UNITS TABS Take 5,000 Units by mouth daily.     . clobetasol cream (TEMOVATE) 9.83 % Apply 1 application topically 2 (two) times a week. 30 g 2  . clonazePAM (KLONOPIN) 1 MG tablet Take 1-2 mg by mouth 2 (two) times daily. Take 0.5 tablet (0.5 mg) during the day  and 2 tablets (2 mg) at bedtime    . ezetimibe-simvastatin (VYTORIN) 10-40 MG per tablet Take 0.5 tablets by mouth at bedtime.     Marland Kitchen levothyroxine (SYNTHROID) 50 MCG tablet Take 50 mcg by mouth daily.     . Misc Natural Products (OSTEO BI-FLEX ADV DOUBLE ST) TABS Take 1 tablet by mouth 2 (two) times daily.     . mometasone (ASMANEX) 220 MCG/INH inhaler Inhale 1 puff into the lungs daily as needed (for shortness of breath).     . Multiple Vitamin (MULTIVITAMIN) tablet Take 1 tablet by mouth daily.    . Probiotic Product (PROBIOTIC DAILY PO) Take 1 capsule by mouth daily.     No current facility-administered  medications for this visit.    Allergies:   Amoxicillin-pot clavulanate and Statins    Social History:  The patient  reports that she has never smoked. She does not have any smokeless tobacco history on file. She reports that she does not drink alcohol or use illicit drugs.   Family History:  The patient's family history includes Cancer in her father and mother; Diabetes in her sister; Heart attack in her sister; Hypertension in her mother.    ROS:  Please see the history of present illness.    Otherwise, review of systems positive for none.   All other systems are reviewed and negative.    PHYSICAL EXAM: VS:  BP 110/68 mmHg  Pulse 85  Ht 5' (1.524 m)  Wt 130 lb (58.968 kg)  BMI 25.39 kg/m2 , BMI Body mass index is 25.39 kg/(m^2).  General: Alert, oriented x3, no distress Head: no evidence of trauma, PERRL, EOMI, no exophtalmos or lid lag, no myxedema, no xanthelasma; normal ears, nose and oropharynx Neck: normal jugular venous pulsations and no hepatojugular reflux; brisk carotid pulses without delay and no carotid bruits Chest: clear to auscultation, no signs of consolidation by percussion or palpation, normal fremitus, symmetrical and full respiratory excursions, healthy loop recorder site Cardiovascular: normal position and quality of the apical impulse, regular rhythm, normal first and paradoxically split second heart sounds, no murmurs, rubs or gallops Abdomen: no tenderness or distention, no masses by palpation, no abnormal pulsatility or arterial bruits, normal bowel sounds, no hepatosplenomegaly Extremities: no clubbing, cyanosis or edema; 2+ radial, ulnar and brachial pulses bilaterally; 2+ right femoral, posterior tibial and dorsalis pedis pulses; 2+ left femoral, posterior tibial and dorsalis pedis pulses; no subclavian or femoral bruits Neurological: grossly nonfocal Psych: euthymic mood, full affect   EKG:  EKG is ordered today. The ekg ordered today demonstrates  sinus rhythm, left bundle branch block   Recent Labs: 10/09/2014: Pro B Natriuretic peptide (BNP) 78.0 03/11/2015: ALT 20; BUN 12; Creatinine, Ser 0.85; Hemoglobin 13.7; Platelets 260; Potassium 4.0; Sodium 140    Lipid Panel    Component Value Date/Time   CHOL 145 09/23/2008 2026   TRIG 94 09/23/2008 2026   HDL 53 09/23/2008 2026   CHOLHDL 2.7 Ratio 09/23/2008 2026   VLDL 19 09/23/2008 2026   LDLCALC 73 09/23/2008 2026      Wt Readings from Last 3 Encounters:  07/26/15 130 lb (58.968 kg)  05/03/15 131 lb 4.8 oz (59.557 kg)  03/11/15 134 lb (60.782 kg)     ASSESSMENT AND PLAN:  1. Asymptomatic hypertrophic cardiomyopathy, status post successful septal myectomy with complete resolution of outflow tract gradient. No overt signs or symptoms of diastolic heart failure  2. History of near syncope with concern for both possible bradycardia arrhythmia (  left bundle branch block) and tachyarrhythmia (hypertrophic cardiopathy and history of Mycamine), neither demonstrated by prolonged monitoring with implantable loop recorder  3. Hypertension, well controlled    Current medicines are reviewed at length with the patient today.  The patient does not have concerns regarding medicines.  The following changes have been made:  no change  Labs/ tests ordered today include:  No orders of the defined types were placed in this encounter.      Patient Instructions  Dr. Sallyanne Kuster recommends that you schedule a follow-up appointment in: [place].  Advance Directive Advance directives are the legal documents that allow you to make choices about your health care and medical treatment if you cannot speak for yourself. Advance directives are a way for you to communicate your wishes to family, friends, and health care providers. The specified people can then convey your decisions about end-of-life care to avoid confusion if you should become unable to communicate. Ideally, the process of  discussing and writing advance directives should happen over time rather than making decisions all at once. Advance directives can be modified as your situation changes, and you can change your mind at any time, even after you have signed the advance directives. Each state has its own laws regarding advance directives. You may want to check with your health care provider, attorney, or state representative about the law in your state. Below are some examples of advance directives. LIVING WILL A living will is a set of instructions documenting your wishes about medical care when you cannot care for yourself. It is used if you become:  Terminally ill.  Incapacitated.  Unable to communicate.  Unable to make decisions. Items to consider in your living will include:  The use or non-use of life-sustaining equipment, such as dialysis machines and breathing machines (ventilators).  A do not resuscitate (DNR) order, which is the instruction not to use cardiopulmonary resuscitation (CPR) if breathing or heartbeat stops.  Tube feeding.  Withholding of food and fluids.  Comfort (palliative) care when the goal becomes comfort rather than a cure.  Organ and tissue donation. A living will does not give instructions about distribution of your money and property if you should pass away. It is advisable to seek the expert advice of a lawyer in drawing up a will regarding your possessions. Decisions about taxes, beneficiaries, and asset distribution will be legally binding. This process can relieve your family and friends of any burdens surrounding disputes or questions that may come up about the allocation of your assets. DO NOT RESUSCITATE (DNR) A do not resuscitate (DNR) order is a request to not have CPR in the event that your heart stops beating or you stop breathing. Unless given other instructions, a health care provider will try to help any patient whose heart has stopped or who has stopped breathing.   HEALTH CARE PROXY AND DURABLE POWER OF ATTORNEY FOR HEALTH CARE A health care proxy is a person (agent) appointed to make medical decisions for you if you cannot. Generally, people choose someone they know well and trust to represent their preferences when they can no longer do so. You should be sure to ask this person for agreement to act as your agent. An agent may have to exercise judgment in the event of a medical decision for which your wishes are not known. The durable power of attorney for health care is the legal document that names your health care proxy. Once written, it should be:  Signed.  Notarized.  Dated.  Copied.  Witnessed.  Incorporated into your medical record. You may also want to appoint someone to manage your financial affairs if you cannot. This is called a durable power of attorney for finances. It is a separate legal document from the durable power of attorney for health care. You may choose the same person or someone different from your health care proxy to act as your agent in financial matters. Document Released: 03/05/2008 Document Revised: 12/02/2013 Document Reviewed: 04/16/2013 [facility] Patient Information 2015 [place], [place]. This information is not intended to replace advice given to you by your health care provider. Make sure you discuss any questions you have with your health care provider.   Mikael Spray, MD  07/27/2015 3:26 PM    Sanda Klein, MD, [facility] [phone] office [phone] pager

## 2015-07-26 NOTE — Patient Instructions (Addendum)
Dr. Sallyanne Kuster recommends that you schedule a follow-up appointment in: ONE YEAR.  Advance Directive Advance directives are the legal documents that allow you to make choices about your health care and medical treatment if you cannot speak for yourself. Advance directives are a way for you to communicate your wishes to family, friends, and health care providers. The specified people can then convey your decisions about end-of-life care to avoid confusion if you should become unable to communicate. Ideally, the process of discussing and writing advance directives should happen over time rather than making decisions all at once. Advance directives can be modified as your situation changes, and you can change your mind at any time, even after you have signed the advance directives. Each state has its own laws regarding advance directives. You may want to check with your health care provider, attorney, or state representative about the law in your state. Below are some examples of advance directives. LIVING WILL A living will is a set of instructions documenting your wishes about medical care when you cannot care for yourself. It is used if you become:  Terminally ill.  Incapacitated.  Unable to communicate.  Unable to make decisions. Items to consider in your living will include:  The use or non-use of life-sustaining equipment, such as dialysis machines and breathing machines (ventilators).  A do not resuscitate (DNR) order, which is the instruction not to use cardiopulmonary resuscitation (CPR) if breathing or heartbeat stops.  Tube feeding.  Withholding of food and fluids.  Comfort (palliative) care when the goal becomes comfort rather than a cure.  Organ and tissue donation. A living will does not give instructions about distribution of your money and property if you should pass away. It is advisable to seek the expert advice of a lawyer in drawing up a will regarding your possessions.  Decisions about taxes, beneficiaries, and asset distribution will be legally binding. This process can relieve your family and friends of any burdens surrounding disputes or questions that may come up about the allocation of your assets. DO NOT RESUSCITATE (DNR) A do not resuscitate (DNR) order is a request to not have CPR in the event that your heart stops beating or you stop breathing. Unless given other instructions, a health care provider will try to help any patient whose heart has stopped or who has stopped breathing.  HEALTH CARE PROXY AND DURABLE POWER OF ATTORNEY FOR HEALTH CARE A health care proxy is a person (agent) appointed to make medical decisions for you if you cannot. Generally, people choose someone they know well and trust to represent their preferences when they can no longer do so. You should be sure to ask this person for agreement to act as your agent. An agent may have to exercise judgment in the event of a medical decision for which your wishes are not known. The durable power of attorney for health care is the legal document that names your health care proxy. Once written, it should be:  Signed.  Notarized.  Dated.  Copied.  Witnessed.  Incorporated into your medical record. You may also want to appoint someone to manage your financial affairs if you cannot. This is called a durable power of attorney for finances. It is a separate legal document from the durable power of attorney for health care. You may choose the same person or someone different from your health care proxy to act as your agent in financial matters. Document Released: 03/05/2008 Document Revised: 12/02/2013 Document Reviewed: 04/16/2013 [employer]  Patient Information 2015 [employer]. This information is not intended to replace advice given to you by your health care provider. Make sure you discuss any questions you have with your health care provider.  

## 2015-07-27 ENCOUNTER — Ambulatory Visit: Payer: Medicare Other | Admitting: Physical Therapy

## 2015-07-27 DIAGNOSIS — R293 Abnormal posture: Secondary | ICD-10-CM

## 2015-07-27 DIAGNOSIS — M436 Torticollis: Secondary | ICD-10-CM

## 2015-07-27 DIAGNOSIS — M2560 Stiffness of unspecified joint, not elsewhere classified: Secondary | ICD-10-CM

## 2015-07-27 DIAGNOSIS — M545 Low back pain: Secondary | ICD-10-CM

## 2015-07-27 DIAGNOSIS — M542 Cervicalgia: Secondary | ICD-10-CM | POA: Diagnosis not present

## 2015-07-27 DIAGNOSIS — R6889 Other general symptoms and signs: Secondary | ICD-10-CM

## 2015-07-27 NOTE — Therapy (Signed)
[place], [place], [address] Phone: [phone]   Fax:  [phone]  Physical Therapy Treatment  Patient Details  Name: Autumn Fitzgerald MRN: [id number] Date of Birth: 23-Mar-1946 Referring Provider:  Nolene Ebbs, MD  Encounter Date: 07/27/2015      PT End of Session - 07/27/15 1710    Visit Number 5   Number of Visits 12   Date for PT Re-Evaluation 08/10/15   PT Start Time 4034   PT Stop Time 1500   PT Time Calculation (min) 40 min   Activity Tolerance Patient tolerated treatment well   Behavior During Therapy [facility] for tasks assessed/performed      Past Medical History  Diagnosis Date  . Panic attacks   . 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 GERD (gastroesophageal reflux disease)   . HLD (hyperlipidemia)   . Asthma   . Palpitations   . Near syncope   . Chest wall pain   . Hypertension     at times,  . Mental disorder   . Depression   . Head injury, closed, without LOC 02/2013    did not LOC per patient  . Arthritis   . Hyperthyroidism     Past Surgical History  Procedure Laterality Date  . 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 Tubal ligation    . Cholecystectomy    . Loop recorder implant  03/18/2013  . Septal myectomy    . Nuclear stress test  11/30/2011    No ischemia  . Doppler echocardiography  03/14/2013    EF 65-70%,trivial AI,mild-mod. MR,Mod. TR,mod. concentric hypertrophy  . Knee arthroscopy Right 10/17/2013    Procedure: ARTHROSCOPY RIGHT KNEE;  Surgeon: [name], MD;  Location: [place];  Service: Orthopedics;  Laterality: Right;  . Esophagogastroduodenoscopy N/A 11/24/2013    Procedure: ESOPHAGOGASTRODUODENOSCOPY (EGD);  Surgeon: Juanita Craver, MD;  Location: [facility];  Service: Endoscopy;  Laterality: N/A;  . Colonoscopy N/A 11/24/2013    Procedure: COLONOSCOPY;  Surgeon: Juanita Craver, MD;  Location: [facility];  Service: Endoscopy;  Laterality:  N/A;  . Loop recorder implant N/A 03/18/2013    Procedure: LOOP RECORDER IMPLANT;  Surgeon: Sanda Klein, MD;  Location: [place] [facility];  Service: Cardiovascular;  Laterality: N/A;    There were no vitals filed for this visit.  Visit Diagnosis:  Bilateral low back pain, with sciatica presence unspecified  Stiffness of neck  Joint stiffness of spine  Abnormal posture  Activity intolerance      Subjective Assessment - 07/27/15 1439    Subjective PT is helping my back pain I still have arthritis and shoulder pain.  6/10.  Doing her exercises.  Cannot do exercises on her stomach.                         [place] Adult PT Treatment/Exercise - 07/27/15 1425    Neck Exercises: Standing   Other Standing Exercises ER red band 10 reps, cues.   Lumbar Exercises: Stretches   Passive Hamstring Stretch 3 reps;30 seconds   Single Knee to Chest Stretch 5 reps  10 second holds.   Lower Trunk Rotation 5 reps   Lumbar Exercises: Aerobic   Stationary Bike Nustep L4, 8 minutes   Lumbar Exercises: Machines for Strengthening   Other Lumbar Machine Exercise Hip abduction 1 plate 10 reps , cues   Lumbar Exercises: Standing   Heel Raises --  10, both and single  Lifting Other (comment)   Lifting Limitations good technique noted.  practiced lifting small ball from mat.   Wall Slides 10 reps  small motions, patient fearful, contact guard.   Scapular Retraction Limitations 10   Theraband Level (Row) Level 2 (Red)   Lumbar Exercises: Supine   Clam 10 reps  using blue band, cues breathing   Bridge 10 reps   Bridge Limitations with clams, multiple cues,  Bridge with ball squeeze                  PT Short Term Goals - 07/27/15 1441    PT SHORT TERM GOAL #1   Title She will be independent with inital HEP   Baseline independent   Time 4   Period Weeks   Status Achieved   PT SHORT TERM GOAL #2   Title She will report pain improved 30% or more   Time 4   Period Weeks    Status On-going   PT SHORT TERM GOAL #3   Title She will demo understanding of good posture and mechanics   Baseline demo correct lifting, verbalized techniques at home foe ADL's   Time 4   Period Weeks   Status Achieved   PT SHORT TERM GOAL #4   Title She will report beiing able to walk to store with no significant incr pain.    Baseline 8/10 pain.  40 minutes 1 X aweek   Time 4   Period Weeks   Status On-going           PT Long Term Goals - 06/29/15 1613    PT LONG TERM GOAL #1   Title she will be independent with all hEP issued as of last visit   Time 8   Period Weeks   Status New   PT LONG TERM GOAL #2   Title She will improve FOTO score to < 50% to indicate improved function.   Time 8   Period Weeks   Status New   PT LONG TERM GOAL #3   Title She will report she is able to do home tasks with 50% less pain   Time 8   Period Weeks   Status New   PT LONG TERM GOAL #4   Title She will report general decrease in pain to pre flare up level of pain.    Time 8   Period Weeks   Status New   PT LONG TERM GOAL #5   Title She will report leg pain improved 75%  with walking and home tasks   Time 8   Period Weeks   Status New               Plan - 07/27/15 1712    Clinical Impression Statement Able to progress to standing and closed chain lumbar and posture exercises.  Lt shoulder had some increased pain 7/10 post exercise.  Back 4/10 post session.    STG # 1 and #3 met.    Problem List Patient Active Problem List   Diagnosis Date Noted  . Lichen sclerosus et atrophicus 06/18/2014  . Benign gastric polyp 02/20/2014  . Degenerative joint disease involving multiple joints 09/10/2013  . Hypertrophic obstructive cardiomyopathy 07/23/2013  . LBBB (left bundle branch block) 07/23/2013  . Subclinical hypothyroidism 05/14/2012  . Lower abdominal pain 01/10/2012  . Panic attacks   . GERD (gastroesophageal reflux disease)   . HLD (hyperlipidemia)   . Asthma   .  HTN (hypertension)   .  Palpitations   . Allergic rhinitis 02/24/2011  . Anxiety, generalized 02/24/2011  . Chronic recurrent major depressive disorder 02/24/2011    [facility] 07/27/2015, 5:15 PM  [facility] [address] [place], [place], [address] Phone: [phone]   Fax:  [phone]     Melvenia Needles, PTA 07/27/2015 5:15 PM Phone: [phone] Fax: [phone]

## 2015-07-29 ENCOUNTER — Ambulatory Visit: Payer: Medicare Other | Admitting: Physical Therapy

## 2015-07-29 ENCOUNTER — Encounter: Payer: Self-pay | Admitting: Cardiovascular Disease

## 2015-07-29 DIAGNOSIS — M542 Cervicalgia: Secondary | ICD-10-CM | POA: Diagnosis not present

## 2015-07-29 DIAGNOSIS — R6889 Other general symptoms and signs: Secondary | ICD-10-CM

## 2015-07-29 DIAGNOSIS — M545 Low back pain: Secondary | ICD-10-CM

## 2015-07-29 DIAGNOSIS — R293 Abnormal posture: Secondary | ICD-10-CM

## 2015-07-29 DIAGNOSIS — M2560 Stiffness of unspecified joint, not elsewhere classified: Secondary | ICD-10-CM

## 2015-07-29 NOTE — Therapy (Signed)
[place], [place], [address] Phone: [phone]   Fax:  [phone]  Physical Therapy Treatment  Patient Details  Name: Autumn Fitzgerald MRN: [id number] Date of Birth: 1946/10/31 Referring Provider:  Nolene Ebbs, MD  Encounter Date: 07/29/2015      PT End of Session - 07/29/15 1545    Visit Number 6   Number of Visits 12   Date for PT Re-Evaluation 08/10/15   PT Start Time 1503   PT Stop Time 1545   PT Time Calculation (min) 42 min   Activity Tolerance Patient tolerated treatment well      Past Medical History  Diagnosis Date  . Panic attacks   . 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 GERD (gastroesophageal reflux disease)   . HLD (hyperlipidemia)   . Asthma   . Palpitations   . Near syncope   . Chest wall pain   . Hypertension     at times,  . Mental disorder   . Depression   . Head injury, closed, without LOC 02/2013    did not LOC per patient  . Arthritis   . Hyperthyroidism     Past Surgical History  Procedure Laterality Date  . 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 Tubal ligation    . Cholecystectomy    . Loop recorder implant  03/18/2013  . Septal myectomy    . Nuclear stress test  11/30/2011    No ischemia  . Doppler echocardiography  03/14/2013    EF 65-70%,trivial AI,mild-mod. MR,Mod. TR,mod. concentric hypertrophy  . Knee arthroscopy Right 10/17/2013    Procedure: ARTHROSCOPY RIGHT KNEE;  Surgeon: [name], MD;  Location: [place];  Service: Orthopedics;  Laterality: Right;  . Esophagogastroduodenoscopy N/A 11/24/2013    Procedure: ESOPHAGOGASTRODUODENOSCOPY (EGD);  Surgeon: Juanita Craver, MD;  Location: [facility];  Service: Endoscopy;  Laterality: N/A;  . Colonoscopy N/A 11/24/2013    Procedure: COLONOSCOPY;  Surgeon: Juanita Craver, MD;  Location: [facility];  Service: Endoscopy;  Laterality: N/A;  . Loop recorder implant N/A 03/18/2013    Procedure:  LOOP RECORDER IMPLANT;  Surgeon: Sanda Klein, MD;  Location: [place] [facility];  Service: Cardiovascular;  Laterality: N/A;    There were no vitals filed for this visit.  Visit Diagnosis:  Bilateral low back pain, with sciatica presence unspecified  Joint stiffness of spine  Abnormal posture  Activity intolerance      Subjective Assessment - 07/29/15 1503    Subjective I am doing OK.    Currently in Pain? No/denies  knee 6/10 LT, constant                         [facility] Adult PT Treatment/Exercise - 07/29/15 1510    Neck Exercises: Machines for Strengthening   Other Machines for Strengthening cable cross 3 Lbs row cues   Lumbar Exercises: Stretches   Passive Hamstring Stretch 3 reps;30 seconds   Single Knee to Chest Stretch --  10 reps 5 second hpolds   Lower Trunk Rotation --  10 reps each side   Lumbar Exercises: Aerobic   Stationary Bike L6 minutes    Lumbar Exercises: Machines for Strengthening   Other Lumbar Machine Exercise Hip abduction 1 plate 10 reps , cues   Lumbar Exercises: Standing   Heel Raises --  10, both and single   Wall Slides 10 reps  small motions, patient fearful, contact guard.   Scapular  Retraction 10 reps   Scapular Retraction Limitations 10   Row --   3 lbs   Shoulder Extension --  4 reps difficult so stopped cable cross   Other Standing Lumbar Exercises hip extension 10 reps each   Lumbar Exercises: Supine   Clam 10 reps  using blue band, cues breathing   Bridge 10 reps   Bridge Limitations with clams, multiple cues,  Bridge with ball squeeze   Lumbar Exercises: Sidelying   Clam 10 reps                  PT Short Term Goals - 07/27/15 1441    PT SHORT TERM GOAL #1   Title She will be independent with inital HEP   Baseline independent   Time 4   Period Weeks   Status Achieved   PT SHORT TERM GOAL #2   Title She will report pain improved 30% or more   Time 4   Period Weeks   Status On-going   PT SHORT  TERM GOAL #3   Title She will demo understanding of good posture and mechanics   Baseline demo correct lifting, verbalized techniques at home foe ADL's   Time 4   Period Weeks   Status Achieved   PT SHORT TERM GOAL #4   Title She will report beiing able to walk to store with no significant incr pain.    Baseline 8/10 pain.  40 minutes 1 X aweek   Time 4   Period Weeks   Status On-going           PT Long Term Goals - 06/29/15 1613    PT LONG TERM GOAL #1   Title she will be independent with all hEP issued as of last visit   Time 8   Period Weeks   Status New   PT LONG TERM GOAL #2   Title She will improve FOTO score to < 50% to indicate improved function.   Time 8   Period Weeks   Status New   PT LONG TERM GOAL #3   Title She will report she is able to do home tasks with 50% less pain   Time 8   Period Weeks   Status New   PT LONG TERM GOAL #4   Title She will report general decrease in pain to pre flare up level of pain.    Time 8   Period Weeks   Status New   PT LONG TERM GOAL #5   Title She will report leg pain improved 75%  with walking and home tasks   Time 8   Period Weeks   Status New               Plan - 07/29/15 1548    Clinical Impression Statement Standing stabilization flared shoulders and scapular retractions helpful.  Standing stabilizations otherwise tolerated.   PT Next Visit Plan Check specific goals. (Walking)  Consider home walking program   Consulted and Agree with Plan of Care Patient        Problem List Patient Active Problem List   Diagnosis Date Noted  . Lichen sclerosus et atrophicus 06/18/2014  . Benign gastric polyp 02/20/2014  . Degenerative joint disease involving multiple joints 09/10/2013  . Hypertrophic obstructive cardiomyopathy 07/23/2013  . LBBB (left bundle branch block) 07/23/2013  . Subclinical hypothyroidism 05/14/2012  . Lower abdominal pain 01/10/2012  . Panic attacks   . GERD (gastroesophageal reflux  disease)   .  HLD (hyperlipidemia)   . Asthma   . HTN (hypertension)   . Palpitations   . Allergic rhinitis 02/24/2011  . Anxiety, generalized 02/24/2011  . Chronic recurrent major depressive disorder 02/24/2011    [facility] 07/29/2015, 3:55 PM  [facility] [address] [place], [place], [address] Phone: [phone]   Fax:  [phone]     Melvenia Needles, PTA 07/29/2015 3:55 PM Phone: [phone] Fax: [phone]

## 2015-07-30 ENCOUNTER — Ambulatory Visit (INDEPENDENT_AMBULATORY_CARE_PROVIDER_SITE_OTHER): Payer: Medicare Other | Admitting: *Deleted

## 2015-07-30 DIAGNOSIS — R002 Palpitations: Secondary | ICD-10-CM | POA: Diagnosis not present

## 2015-08-03 ENCOUNTER — Ambulatory Visit: Payer: Medicare Other | Admitting: Physical Therapy

## 2015-08-03 DIAGNOSIS — M545 Low back pain: Secondary | ICD-10-CM

## 2015-08-03 DIAGNOSIS — M542 Cervicalgia: Secondary | ICD-10-CM | POA: Diagnosis not present

## 2015-08-03 DIAGNOSIS — M2560 Stiffness of unspecified joint, not elsewhere classified: Secondary | ICD-10-CM

## 2015-08-03 DIAGNOSIS — R6889 Other general symptoms and signs: Secondary | ICD-10-CM

## 2015-08-03 DIAGNOSIS — R293 Abnormal posture: Secondary | ICD-10-CM

## 2015-08-03 NOTE — Therapy (Signed)
[place], [place], [address] Phone: [phone]   Fax:  [phone]  Physical Therapy Treatment  Patient Details  Name: Autumn Fitzgerald MRN: [id number] Date of Birth: 06-30-1946 Referring Provider:  Nolene Ebbs, MD  Encounter Date: 08/03/2015      PT End of Session - 08/03/15 1459    Visit Number 7   Date for PT Re-Evaluation 08/10/15   PT Start Time 0223   PT Stop Time 1500   PT Time Calculation (min) 43 min   Activity Tolerance Patient tolerated treatment well;Patient limited by pain  Lt shoulder   Behavior During Therapy [facility] for tasks assessed/performed      Past Medical History  Diagnosis Date  . Panic attacks   . 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 GERD (gastroesophageal reflux disease)   . HLD (hyperlipidemia)   . Asthma   . Palpitations   . Near syncope   . Chest wall pain   . Hypertension     at times,  . Mental disorder   . Depression   . Head injury, closed, without LOC 02/2013    did not LOC per patient  . Arthritis   . Hyperthyroidism     Past Surgical History  Procedure Laterality Date  . 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 Tubal ligation    . Cholecystectomy    . Loop recorder implant  03/18/2013  . Septal myectomy    . Nuclear stress test  11/30/2011    No ischemia  . Doppler echocardiography  03/14/2013    EF 65-70%,trivial AI,mild-mod. MR,Mod. TR,mod. concentric hypertrophy  . Knee arthroscopy Right 10/17/2013    Procedure: ARTHROSCOPY RIGHT KNEE;  Surgeon: [name], MD;  Location: [place];  Service: Orthopedics;  Laterality: Right;  . Esophagogastroduodenoscopy N/A 11/24/2013    Procedure: ESOPHAGOGASTRODUODENOSCOPY (EGD);  Surgeon: Juanita Craver, MD;  Location: [facility];  Service: Endoscopy;  Laterality: N/A;  . Colonoscopy N/A 11/24/2013    Procedure: COLONOSCOPY;  Surgeon: Juanita Craver, MD;  Location: [facility];  Service:  Endoscopy;  Laterality: N/A;  . Loop recorder implant N/A 03/18/2013    Procedure: LOOP RECORDER IMPLANT;  Surgeon: Sanda Klein, MD;  Location: [place] C[facility];  Service: Cardiovascular;  Laterality: N/A;    There were no vitals filed for this visit.  Visit Diagnosis:  Bilateral low back pain, with sciatica presence unspecified  Joint stiffness of spine  Abnormal posture  Activity intolerance      Subjective Assessment - 08/03/15 1424    Subjective knee 6/10. Sides were sore but now she feels better.  Shoulders always hurt.5/10 Lt shoulder  No pain nubber give.  Nustep helps shoulders .  Pain with walking pain 7/10 in knee abd low back.  She plans to walk some this Friday for exercise.   Currently in Pain? No/denies   Pain Location Back   Multiple Pain Sites Yes   Pain Score 6  knees hurt with going up stairs   Pain Location Knee   Pain Orientation Left;Anterior   Pain Descriptors / Indicators Aching   Pain Onset More than a month ago   Pain Frequency Constant   Aggravating Factors  walking up stairs   Pain Relieving Factors rest                         [facility] Adult PT Treatment/Exercise - 08/03/15 0001    Neck Exercises: Supine  Other Supine Exercise dead bug, 7 reps stopped dur to LT shoulder pain   Lumbar Exercises: Stretches   Single Knee to Chest Stretch --  10 reps 5 second holds   Lower Trunk Rotation --  10 reps 5 seconds holding , on moist heat for all supine exe   Lumbar Exercises: Standing   Heel Raises --  10, both and single   Wall Slides 10 reps  small motions, patient less fearful   Row --  cable cross 10 X 3 LBS, cues   Theraband Level (Row) --  cable cross 3 LBS 10 cues   Other Standing Lumbar Exercises hip extension 10 reps each   Lumbar Exercises: Supine   Clam 10 reps  using blue band, cues breathing   Bridge 10 reps   Moist Heat Therapy   Number Minutes Moist Heat 15 Minutes   Moist Heat Location Lumbar Spine  for all supine  exercises                  PT Short Term Goals - 08/03/15 1515    PT SHORT TERM GOAL #1   Title She will be independent with inital HEP   Baseline independent   Time 4   Period Weeks   Status Achieved   PT SHORT TERM GOAL #2   Title She will report pain improved 30% or more   Baseline varies.  No back pain today.  Knee pain shoulder pain are present.  Not yet consistant.   Time 4   Period Weeks   Status Partially Met   PT SHORT TERM GOAL #3   Title She will demo understanding of good posture and mechanics   Status Achieved   PT SHORT TERM GOAL #4   Title She will report beiing able to walk to store with no significant incr pain.    Baseline 7/10 pain   Time 4   Period Weeks   Status On-going           PT Long Term Goals - 08/03/15 1517    PT LONG TERM GOAL #1   Title she will be independent with all hEP issued as of last visit   Time 8   Period Weeks   Status On-going   PT LONG TERM GOAL #2   Title She will improve FOTO score to < 50% to indicate improved function.   Time 8   Period Weeks   Status Unable to assess   PT LONG TERM GOAL #3   Time 8   Period Weeks   PT LONG TERM GOAL #4   Title She will report general decrease in pain to pre flare up level of pain.    Baseline not yet consistant but trending.   Time 8   Period Weeks   Status On-going   PT LONG TERM GOAL #5   Title She will report leg pain improved 75%  with walking and home tasks   Time 8   Period Weeks   Status On-going               Plan - 08/03/15 1500    Clinical Impression Statement 5/10 shoulder pain post session.  Stabilization focus.  Progress with pain with walking goal , was 8/10 , now 7/10 low back and knee.  She does not have a real safe place to walk near her home.           Problem List Patient Active Problem List  Diagnosis Date Noted  . Lichen sclerosus et atrophicus 06/18/2014  . Benign gastric polyp 02/20/2014  . Degenerative joint disease  involving multiple joints 09/10/2013  . Hypertrophic obstructive cardiomyopathy 07/23/2013  . LBBB (left bundle branch block) 07/23/2013  . Subclinical hypothyroidism 05/14/2012  . Lower abdominal pain 01/10/2012  . Panic attacks   . GERD (gastroesophageal reflux disease)   . HLD (hyperlipidemia)   . Asthma   . HTN (hypertension)   . Palpitations   . Allergic rhinitis 02/24/2011  . Anxiety, generalized 02/24/2011  . Chronic recurrent major depressive disorder 02/24/2011    [facility] 08/03/2015, 3:19 PM  [facility] [address] [place], [place], [address] Phone: [phone]   Fax:  [phone]     Melvenia Needles, PTA 08/03/2015 3:19 PM Phone: [phone] Fax: [phone]

## 2015-08-04 NOTE — Progress Notes (Signed)
Loop recorder 

## 2015-08-05 ENCOUNTER — Ambulatory Visit: Payer: Medicare Other | Admitting: Physical Therapy

## 2015-08-05 DIAGNOSIS — M2560 Stiffness of unspecified joint, not elsewhere classified: Secondary | ICD-10-CM

## 2015-08-05 DIAGNOSIS — R6889 Other general symptoms and signs: Secondary | ICD-10-CM

## 2015-08-05 DIAGNOSIS — M542 Cervicalgia: Secondary | ICD-10-CM | POA: Diagnosis not present

## 2015-08-05 DIAGNOSIS — R293 Abnormal posture: Secondary | ICD-10-CM

## 2015-08-05 NOTE — Therapy (Addendum)
[place], [place], [address] Phone: [phone]   Fax:  [phone]  Physical Therapy Treatment  Patient Details  Name: Autumn Fitzgerald MRN: [id number] Date of Birth: July 20, 1946 Referring Provider:  Nolene Ebbs, MD  Encounter Date: 08/05/2015      PT End of Session - 08/05/15 1457    Visit Number 8   Date for PT Re-Evaluation 08/10/15   PT Start Time 2637   PT Stop Time 8588   PT Time Calculation (min) 43 min   Activity Tolerance Patient tolerated treatment well;No increased pain;Treatment limited secondary to agitation   Behavior During Therapy [facility] for tasks assessed/performed      Past Medical History  Diagnosis Date  . Panic attacks   . 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 GERD (gastroesophageal reflux disease)   . HLD (hyperlipidemia)   . Asthma   . Palpitations   . Near syncope   . Chest wall pain   . Hypertension     at times,  . Mental disorder   . Depression   . Head injury, closed, without LOC 02/2013    did not LOC per patient  . Arthritis   . Hyperthyroidism     Past Surgical History  Procedure Laterality Date  . 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 Tubal ligation    . Cholecystectomy    . Loop recorder implant  03/18/2013  . Septal myectomy    . Nuclear stress test  11/30/2011    No ischemia  . Doppler echocardiography  03/14/2013    EF 65-70%,trivial AI,mild-mod. MR,Mod. TR,mod. concentric hypertrophy  . Knee arthroscopy Right 10/17/2013    Procedure: ARTHROSCOPY RIGHT KNEE;  Surgeon: [name], MD;  Location: [place];  Service: Orthopedics;  Laterality: Right;  . Esophagogastroduodenoscopy N/A 11/24/2013    Procedure: ESOPHAGOGASTRODUODENOSCOPY (EGD);  Surgeon: Juanita Craver, MD;  Location: [facility];  Service: Endoscopy;  Laterality: N/A;  . Colonoscopy N/A 11/24/2013    Procedure: COLONOSCOPY;  Surgeon: Juanita Craver, MD;  Location: [facility];  Service: Endoscopy;  Laterality: N/A;  . Loop recorder implant N/A 03/18/2013    Procedure: LOOP RECORDER IMPLANT;  Surgeon: Sanda Klein, MD;  Location: [place] [facility];  Service: Cardiovascular;  Laterality: N/A;    There were no vitals filed for this visit.  Visit Diagnosis:  Abnormal posture  Activity intolerance  Joint stiffness of spine      Subjective Assessment - 08/05/15 1416    Subjective Has a headach.  Pt helps the pain.  It makes you back go back in place.  The exercises help you move easier.   Currently in Pain? No/denies   Pain Location Back   Pain Orientation Right;Left;Mid   Multiple Pain Sites --  has shoulder pain Lt.  5/10                         [facility] Adult PT Treatment/Exercise - 08/05/15 1421    Lumbar Exercises: Stretches   Single Knee to Chest Stretch --  10 reps 5 second holds   Lower Trunk Rotation --  10 reps 5 seconds holding , on moist heat for all supine exe   Pelvic Tilt --  10 reps 5 secont holds   Lumbar Exercises: Aerobic   Stationary Bike Nustep L6 8 minutes   Lumbar Exercises: Standing   Heel Raises --  10, both and single   Other Standing Lumbar Exercises  hip extension 10 reps each   Lumbar Exercises: Supine   Ab Set 5 reps;5 seconds   Clam 10 reps  using blue band, cues breathing   Bent Knee Raise 10 reps   Bridge 10 reps   Lumbar Exercises: Sidelying   Clam --  10 X each   Hip Abduction 10 reps                  PT Short Term Goals - 08/05/15 1500    PT SHORT TERM GOAL #1   Title She will be independent with inital HEP   Status Achieved   PT SHORT TERM GOAL #2   Title She will report pain improved 30% or more   Baseline no back or neck pain   Time 4   Period Weeks   Status Achieved   PT SHORT TERM GOAL #3   Title She will demo understanding of good posture and mechanics   Status Achieved   PT SHORT TERM GOAL #4   Title She will report beiing able to walk to store with no  significant incr pain.    Time 4   Period Weeks   Status Unable to assess           PT Long Term Goals - 08/05/15 1501    PT LONG TERM GOAL #1   Title she will be independent with all hEP issued as of last visit   Time 8   Period Weeks   Status On-going   PT LONG TERM GOAL #2   Title She will improve FOTO score to < 50% to indicate improved function.   Time 8   Period Weeks   Status Unable to assess   PT LONG TERM GOAL #3   Title She will report she is able to do home tasks with 50% less pain   Baseline No back or neck pain   Time 8   Period Weeks   Status Achieved   PT LONG TERM GOAL #4   Title She will report general decrease in pain to pre flare up level of pain.    Time 8   Period Weeks   Status On-going   PT LONG TERM GOAL #5   Title She will report leg pain improved 75%  with walking and home tasks   Baseline met for back and neck.               Plan - 08/05/15 1458    Clinical Impression Statement After discussion with patient about goals.  She has no more back or neck pain.  Pain golas met.  The other pain she has are hands, knees and LT shoulder arthritis pain.     PT Next Visit Plan Check how walking went [facility]        Problem List Patient Active Problem List   Diagnosis Date Noted  . Lichen sclerosus et atrophicus 06/18/2014  . Benign gastric polyp 02/20/2014  . Degenerative joint disease involving multiple joints 09/10/2013  . Hypertrophic obstructive cardiomyopathy 07/23/2013  . LBBB (left bundle branch block) 07/23/2013  . Subclinical hypothyroidism 05/14/2012  . Lower abdominal pain 01/10/2012  . Panic attacks   . GERD (gastroesophageal reflux disease)   . HLD (hyperlipidemia)   . Asthma   . HTN (hypertension)   . Palpitations   . Allergic rhinitis 02/24/2011  . Anxiety, generalized 02/24/2011  . Chronic recurrent major depressive disorder 02/24/2011    [facility] 08/05/2015, 5:40 PM  [facility] [address] [place], [place], [address] Phone: [phone]   Fax:  [phone]     Melvenia Needles, PTA 08/05/2015 5:40 PM Phone: [phone] Fax: [phone] PHYSICAL THERAPY DISCHARGE SUMMARY  Visits from Start of Care: 8  Current functional level related to goals / functional outcomes: Unknown see above for level at last visit   Remaining deficits: Unknown   Education / Equipment: HEP Plan:                                                    Patient goals were partially met. Patient is being discharged due to not returning since the last visit.  ?????   Darrel Hoover, PT     10/27/15             7:37 AM

## 2015-08-06 DIAGNOSIS — R7301 Impaired fasting glucose: Secondary | ICD-10-CM | POA: Diagnosis not present

## 2015-08-06 DIAGNOSIS — J4520 Mild intermittent asthma, uncomplicated: Secondary | ICD-10-CM | POA: Diagnosis not present

## 2015-08-06 DIAGNOSIS — J302 Other seasonal allergic rhinitis: Secondary | ICD-10-CM | POA: Diagnosis not present

## 2015-08-09 LAB — CUP PACEART REMOTE DEVICE CHECK
Date Time Interrogation Session: 20160819195344
Zone Setting Detection Interval: 2000 ms
Zone Setting Detection Interval: 3000 ms
Zone Setting Detection Interval: 370 ms

## 2015-08-09 NOTE — Progress Notes (Signed)
Carelink summary report received.  Battery status OK.  Normal device function.  No new symptom episodes, tachy episodes, brady, or pause episodes.  No new AF episodes.  Monthly summary reports and ROV with [place] in 07/2016.

## 2015-08-10 ENCOUNTER — Ambulatory Visit: Payer: Medicare Other | Admitting: Physical Therapy

## 2015-08-11 ENCOUNTER — Encounter: Payer: Self-pay | Admitting: Obstetrics & Gynecology

## 2015-08-11 ENCOUNTER — Ambulatory Visit (INDEPENDENT_AMBULATORY_CARE_PROVIDER_SITE_OTHER): Payer: Medicare Other | Admitting: Obstetrics & Gynecology

## 2015-08-11 VITALS — BP 117/84 | HR 72 | Temp 97.9°F | Wt 129.3 lb

## 2015-08-11 DIAGNOSIS — N898 Other specified noninflammatory disorders of vagina: Secondary | ICD-10-CM

## 2015-08-11 NOTE — Patient Instructions (Signed)

## 2015-08-11 NOTE — Progress Notes (Signed)
Patient ID: Autumn Fitzgerald, female   DOB: 07/06/1946, 69 y.o.   MRN: [id number] History:  69 y.o. [id number] here today for complaints of greenish vaginal discharge with itching.  Her last sx were noted 2 days prev.  She was recently started on Prednisone and reports that her sx developed AFTER starting the steroids   The following portions of the patient's history were reviewed and updated as appropriate: allergies, current medications, past family history, past medical history, past social history, past surgical history and problem list.   Review of Systems:  Pertinent items are noted in HPI.  Objective:  Physical Exam Blood pressure 117/84, pulse 72, temperature 97.9 F (36.6 C), weight 129 lb 4.8 oz (58.65 kg). Gen: NAD Abd: Soft, nontender and nondistended Pelvic: Normal appearing external genitalia; normal appearing vaginal mucosa- but sl atrophic.  Cervix with stenotic os.  Normal discharge.    Assessment & Plan:  Abnormal discharge. Normal exam  Will send wet mount and KOH and determin need for treatment based on sx. F/u prn  Danton Palmateer L. Harraway-Smith, M.D., Cherlynn June

## 2015-08-12 ENCOUNTER — Encounter: Payer: Self-pay | Admitting: Physical Therapy

## 2015-08-12 LAB — WET PREP, GENITAL
Clue Cells Wet Prep HPF POC: NONE SEEN
Trich, Wet Prep: NONE SEEN
Yeast Wet Prep HPF POC: NONE SEEN

## 2015-08-23 ENCOUNTER — Encounter: Payer: Self-pay | Admitting: Cardiovascular Disease

## 2015-08-30 ENCOUNTER — Ambulatory Visit (INDEPENDENT_AMBULATORY_CARE_PROVIDER_SITE_OTHER): Payer: Medicare Other | Admitting: *Deleted

## 2015-08-30 DIAGNOSIS — R002 Palpitations: Secondary | ICD-10-CM

## 2015-09-01 NOTE — Progress Notes (Signed)
Loop recorder 

## 2015-09-03 ENCOUNTER — Encounter: Payer: Self-pay | Admitting: Cardiovascular Disease

## 2015-09-04 LAB — CUP PACEART REMOTE DEVICE CHECK: Date Time Interrogation Session: 20160919223712

## 2015-09-04 NOTE — Progress Notes (Signed)
Carelink summary report received.  Battery status OK.  Normal device function.  No new symptom episodes, tachy episodes, brady, or pause episodes.  No new AF episodes.  Monthly summary reports and ROV with [facility] in 07/2016. 

## 2015-09-06 DIAGNOSIS — H9209 Otalgia, unspecified ear: Secondary | ICD-10-CM | POA: Diagnosis not present

## 2015-09-06 DIAGNOSIS — I10 Essential (primary) hypertension: Secondary | ICD-10-CM | POA: Diagnosis not present

## 2015-09-06 DIAGNOSIS — J4520 Mild intermittent asthma, uncomplicated: Secondary | ICD-10-CM | POA: Diagnosis not present

## 2015-09-06 DIAGNOSIS — R7301 Impaired fasting glucose: Secondary | ICD-10-CM | POA: Diagnosis not present

## 2015-09-13 ENCOUNTER — Encounter: Payer: Self-pay | Admitting: Family Medicine

## 2015-09-13 ENCOUNTER — Ambulatory Visit (INDEPENDENT_AMBULATORY_CARE_PROVIDER_SITE_OTHER): Payer: Medicare Other | Admitting: Family Medicine

## 2015-09-13 VITALS — BP 114/79 | HR 84 | Ht 60.0 in | Wt 131.4 lb

## 2015-09-13 DIAGNOSIS — L900 Lichen sclerosus et atrophicus: Secondary | ICD-10-CM | POA: Diagnosis not present

## 2015-09-13 DIAGNOSIS — R300 Dysuria: Secondary | ICD-10-CM | POA: Diagnosis not present

## 2015-09-13 LAB — POCT URINALYSIS DIP (DEVICE)
Bilirubin Urine: NEGATIVE
Glucose, UA: NEGATIVE mg/dL
Ketones, ur: NEGATIVE mg/dL
Nitrite: NEGATIVE
Protein, ur: NEGATIVE mg/dL
Specific Gravity, Urine: 1.01 (ref 1.005–1.030)
Urobilinogen, UA: 0.2 mg/dL (ref 0.0–1.0)
pH: 5.5 (ref 5.0–8.0)

## 2015-09-13 MED ORDER — CLOBETASOL PROPIONATE 0.05 % EX CREA: 1.0000 "application " | TOPICAL_CREAM | CUTANEOUS | Status: DC

## 2015-09-13 NOTE — Progress Notes (Signed)
    Subjective:    Patient ID: Autumn Fitzgerald is a 69 y.o. female presenting with Follow-up and Dysuria  on 11/13/5808  HPI: F/u for lichen sclerosis. Using temovate. Feels well. Has occasional itching. Also c/o dysuria, and abdominal pain x 2 days. Denies fever. Report chills. Has some baseline pain in abdomen.  Review of Systems  Constitutional: Negative for fever and chills.  Respiratory: Negative for shortness of breath.   Cardiovascular: Negative for chest pain.  Gastrointestinal: Positive for abdominal pain. Negative for nausea and vomiting.  Genitourinary: Positive for dysuria.  Skin: Negative for rash.      Objective:    BP 114/79 mmHg  Pulse 84  Ht 5' (1.524 m)  Wt 131 lb 6.4 oz (59.603 kg)  BMI 25.66 kg/m2 Physical Exam  Constitutional: She is oriented to person, place, and time. She appears well-developed and well-nourished. No distress.  HENT:  Head: Normocephalic and atraumatic.  Eyes: No scleral icterus.  Neck: Neck supple.  Cardiovascular: Normal rate.   Pulmonary/Chest: Effort normal.  Abdominal: Soft.  Genitourinary:  White plaque noted peri-urethrally and at posterior fourchette only  Neurological: She is alert and oriented to person, place, and time.  Skin: Skin is warm and dry.  Psychiatric: She has a normal mood and affect.        Urinalysis    Component Value Date/Time   COLORURINE YELLOW 03/11/2015 [place] 03/11/2015 1214   LABSPEC 1.010 09/13/2015 1331   PHURINE 5.5 09/13/2015 1331   GLUCOSEU NEGATIVE 09/13/2015 1331   HGBUR TRACE* 09/13/2015 1331   BILIRUBINUR NEGATIVE 09/13/2015 1331   KETONESUR NEGATIVE 09/13/2015 1331   PROTEINUR NEGATIVE 09/13/2015 1331   PROTEINUR trace 07/09/2013 1729   UROBILINOGEN 0.2 09/13/2015 1331   NITRITE NEGATIVE 09/13/2015 1331   NITRITE neg 07/09/2013 1729   LEUKOCYTESUR TRACE* 09/13/2015 1331      Assessment & Plan:   Problem List Items Addressed This Visit      Unprioritized   Lichen sclerosus et atrophicus    Continue temovate. Increase it to daily if symptoms increase, then decrease down to 2x/wk.      Relevant Medications   clobetasol cream (TEMOVATE) 0.05 %    Other Visit Diagnoses    Dysuria    -  Primary    borderline urine--check culture and treat accordingly.    Relevant Orders    Urine culture       Total face-to-face time with patient: 15 minutes. Over 50% of encounter was spent on counseling and coordination of care. Return in about 6 months (around 03/13/2016).  Shaniah Baltes S 09/13/2015 1:35 PM

## 2015-09-13 NOTE — Patient Instructions (Signed)
Lichen Sclerosus Lichen sclerosus is a skin problem. It can happen on any part of the body, but it commonly involves the anal or genital areas. Lichen sclerosus is not an infection or a fungus. Girls and women are more commonly affected than boys and men. CAUSES The cause is not known. It could be the result of an overactive immune system or a lack of certain hormones. Lichen sclerosus is not passed from one person to another (not contagious). SYMPTOMS Your skin may have:  Thin, wrinkled, white areas.  Thickened white areas.  Red and swollen patches.  Tears or cracks.  Bruising.  Blood blisters.  Severe itching. You may also have pain, itching, or burning with urination. Constipation is also common in people with lichen sclerosus. DIAGNOSIS Your caregiver will do a physical exam. Sometimes, a tissue sample (biopsy) may be sent for testing. TREATMENT Treatment may involve putting a thin layer of medicated cream (topical steroid) over the areas with lichen sclerosus. Use the cream only as directed by your caregiver.  HOME CARE INSTRUCTIONS  Only take over-the-counter or prescription medicines as directed by your caregiver.  Keep the vaginal area as clean and dry as possible. SEEK MEDICAL CARE IF: You develop increasing pain, swelling, or redness. Document Released: 04/19/2011 Document Revised: 02/19/2012 Document Reviewed: 04/19/2011 [facility] Patient Information 2015 [place], [place]. This information is not intended to replace advice given to you by your health care provider. Make sure you discuss any questions you have with your health care provider. Urinary Tract Infection Urinary tract infections (UTIs) can develop anywhere along your urinary tract. Your urinary tract is your body's drainage system for removing wastes and extra water. Your urinary tract includes two kidneys, two ureters, a bladder, and a urethra. Your kidneys are a pair of bean-shaped organs. Each kidney is about  the size of your fist. They are located below your ribs, one on each side of your spine. CAUSES Infections are caused by microbes, which are microscopic organisms, including fungi, viruses, and bacteria. These organisms are so small that they can only be seen through a microscope. Bacteria are the microbes that most commonly cause UTIs. SYMPTOMS  Symptoms of UTIs may vary by age and gender of the patient and by the location of the infection. Symptoms in young women typically include a frequent and intense urge to urinate and a painful, burning feeling in the bladder or urethra during urination. Older women and men are more likely to be tired, shaky, and weak and have muscle aches and abdominal pain. A fever may mean the infection is in your kidneys. Other symptoms of a kidney infection include pain in your back or sides below the ribs, nausea, and vomiting. DIAGNOSIS To diagnose a UTI, your caregiver will ask you about your symptoms. Your caregiver also will ask to provide a urine sample. The urine sample will be tested for bacteria and white blood cells. White blood cells are made by your body to help fight infection. TREATMENT  Typically, UTIs can be treated with medication. Because most UTIs are caused by a bacterial infection, they usually can be treated with the use of antibiotics. The choice of antibiotic and length of treatment depend on your symptoms and the type of bacteria causing your infection. HOME CARE INSTRUCTIONS  If you were prescribed antibiotics, take them exactly as your caregiver instructs you. Finish the medication even if you feel better after you have only taken some of the medication.  Drink enough water and fluids to keep  your urine clear or pale yellow.  Avoid caffeine, tea, and carbonated beverages. They tend to irritate your bladder.  Empty your bladder often. Avoid holding urine for long periods of time.  Empty your bladder before and after sexual  intercourse.  After a bowel movement, women should cleanse from front to back. Use each tissue only once. SEEK MEDICAL CARE IF:   You have back pain.  You develop a fever.  Your symptoms do not begin to resolve within 3 days. SEEK IMMEDIATE MEDICAL CARE IF:   You have severe back pain or lower abdominal pain.  You develop chills.  You have nausea or vomiting.  You have continued burning or discomfort with urination. MAKE SURE YOU:   Understand these instructions.  Will watch your condition.  Will get help right away if you are not doing well or get worse. Document Released: 09/06/2005 Document Revised: 05/28/2012 Document Reviewed: 01/05/2012 [facility] Patient Information 2015 [place], [place]. This information is not intended to replace advice given to you by your health care provider. Make sure you discuss any questions you have with your health care provider.

## 2015-09-13 NOTE — Assessment & Plan Note (Signed)
Continue temovate. Increase it to daily if symptoms increase, then decrease down to 2x/wk.

## 2015-09-14 LAB — URINE CULTURE: Colony Count: 5000

## 2015-09-15 ENCOUNTER — Telehealth: Payer: Self-pay | Admitting: General Practice

## 2015-09-15 NOTE — Telephone Encounter (Signed)
Per Dr Kennon Rounds, patient's urine culture is negative. Called patient, no answer- left message stating we are trying to reach you to inform you that your most recent urine culture has come back negative. If you have any questions, you may call us back at the clinics.

## 2015-09-21 ENCOUNTER — Ambulatory Visit (INDEPENDENT_AMBULATORY_CARE_PROVIDER_SITE_OTHER): Payer: Medicare Other | Admitting: Allergy and Immunology

## 2015-09-21 ENCOUNTER — Encounter: Payer: Self-pay | Admitting: Allergy and Immunology

## 2015-09-21 VITALS — BP 112/88 | HR 84 | Resp 20

## 2015-09-21 DIAGNOSIS — G4489 Other headache syndrome: Secondary | ICD-10-CM

## 2015-09-21 DIAGNOSIS — J3089 Other allergic rhinitis: Secondary | ICD-10-CM

## 2015-09-21 DIAGNOSIS — J4540 Moderate persistent asthma, uncomplicated: Secondary | ICD-10-CM

## 2015-09-21 NOTE — Patient Instructions (Signed)
  1. Arrange for neurology consult for headache  2. Continue Asmanex 220 one inhalation one time per day  3. Continue ventolin HFA 2 puffs every 4-6 hours if needed.  4. Continue nasal saline and systane eye drops if needed.  5. Continue 'action plan' for asthma flare with Asmanex 220 two inhalations two times per day.  6. Get a flu vaccine  7. Return in four months or earlier if problem

## 2015-09-21 NOTE — Progress Notes (Signed)
Autumn Fitzgerald  Follow-up Note    Subjective:   Autumn Fitzgerald is a 69 y.o. female who returns to the [facility] in re-evaluation of the following:  HPI Comments:  Autumn Fitzgerald recently had a sinus infection in September requiring an antibiotic which has since resolved. Her asthma has been stable and she does not need to use her SABA. She has not had an exacerbation of her asthma in six months. She has not had any problems with her reflux and she has stopped all reflux treatment. The big issue for Autumn Fitzgerald at this point is her several month history of headache that appears to involve the left frontal region with extension to her left ear and behind her left ear. It is a stabbing pain and is with her most days for a large part of the day. This headache may last several hours and makes her lay down for relief. She can't use the phone with her left ear as this will precipitate a headache. No visual scotoma or nausea. She visited with Dr. Redmond Baseman, [facility], who could not identify any problem.     Current outpatient prescriptions:  .  albuterol (PROVENTIL HFA;VENTOLIN HFA) 108 (90 BASE) MCG/ACT inhaler, Inhale 2 puffs into the lungs every 6 (six) hours as needed for wheezing or shortness of breath., Disp: , Rfl:  .  atenolol (TENORMIN) 25 MG tablet, Take 0.5 tablets (12.5 mg total) by mouth daily as needed (for high blood pressure)., Disp: 45 tablet, Rfl: 3 .  Cholecalciferol (VITAMIN D-3) 5000 UNITS TABS, Take 5,000 Units by mouth daily. , Disp: , Rfl:  .  clobetasol cream (TEMOVATE) 3.47 %, Apply 1 application topically 2 (two) times a week., Disp: 30 g, Rfl: 2 .  clonazePAM (KLONOPIN) 1 MG tablet, Take 1-2 mg by mouth 2 (two) times daily. Take 0.5 tablet (0.5 mg) during the day and 2 tablets (2 mg) at bedtime, Disp: , Rfl:  .  ezetimibe-simvastatin (VYTORIN) 10-40 MG per tablet, Take 0.5 tablets by mouth at bedtime. , Disp: , Rfl:  .   levothyroxine (SYNTHROID) 50 MCG tablet, Take 50 mcg by mouth daily. , Disp: , Rfl:  .  Misc Natural Products (OSTEO BI-FLEX ADV DOUBLE ST) TABS, Take 1 tablet by mouth 2 (two) times daily. , Disp: , Rfl:  .  mometasone (ASMANEX) 220 MCG/INH inhaler, Inhale 1 puff into the lungs daily as needed (for shortness of breath). , Disp: , Rfl:  .  Multiple Vitamin (MULTIVITAMIN) tablet, Take 1 tablet by mouth daily., Disp: , Rfl:  .  Probiotic Product (PROBIOTIC DAILY PO), Take 1 capsule by mouth daily., Disp: , Rfl:  .  Propylene Glycol (SYSTANE BALANCE OP), Apply 1 drop to eye 2 times daily at 12 noon and 4 pm., Disp: , Rfl:  .  UNABLE TO FIND, Med Name: IBgard (for IBS), Disp: , Rfl:  .  aspirin 81 MG tablet, Take 81 mg by mouth daily., Disp: , Rfl:   No orders of the defined types were placed in this encounter.    Past Medical History  Diagnosis Date  . Panic attacks   . 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 GERD (gastroesophageal reflux disease)   . HLD (hyperlipidemia)   . Asthma   . Palpitations   . Near syncope   . Chest wall pain   . Hypertension     at times,  . Mental disorder   . Depression   .  Head injury, closed, without LOC 02/2013    did not LOC per patient  . Arthritis   . Hyperthyroidism     Past Surgical History  Procedure Laterality Date  . 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 Tubal ligation    . Cholecystectomy    . Loop recorder implant  03/18/2013  . Septal myectomy    . Nuclear stress test  11/30/2011    No ischemia  . Doppler echocardiography  03/14/2013    EF 65-70%,trivial AI,mild-mod. MR,Mod. TR,mod. concentric hypertrophy  . Knee arthroscopy Right 10/17/2013    Procedure: ARTHROSCOPY RIGHT KNEE;  Surgeon: [name], MD;  Location: [facility];  Service: Orthopedics;  Laterality: Right;  . Esophagogastroduodenoscopy N/A 11/24/2013    Procedure: ESOPHAGOGASTRODUODENOSCOPY (EGD);  Surgeon: Juanita Craver, MD;  Location: [facility];   Service: Endoscopy;  Laterality: N/A;  . Colonoscopy N/A 11/24/2013    Procedure: COLONOSCOPY;  Surgeon: Juanita Craver, MD;  Location: [facility];  Service: Endoscopy;  Laterality: N/A;  . Loop recorder implant N/A 03/18/2013    Procedure: LOOP RECORDER IMPLANT;  Surgeon: Sanda Klein, MD;  Location: [facility];  Service: Cardiovascular;  Laterality: N/A;    Allergies  Allergen Reactions  . Amoxicillin-Pot Clavulanate   . Statins Other (See Comments)    Myalgias with lipitor; tolerates vytorin    Review of Systems  Constitutional: Negative for fever, chills and weight loss.  HENT: Positive for ear pain. Negative for congestion, ear discharge, hearing loss, nosebleeds, sore throat and tinnitus.   Eyes: Negative for blurred vision, discharge and redness.  Respiratory: Negative for cough, sputum production, shortness of breath and wheezing.   Cardiovascular: Negative for chest pain.  Gastrointestinal: Negative for heartburn, nausea and vomiting.  Skin: Negative for itching and rash.  Neurological: Positive for headaches.     Objective:   Filed Vitals:   09/21/15 1438  BP: 112/88  Pulse: 84  Resp: 20    Physical Exam  Constitutional: She is well-developed, well-nourished, and in no distress. No distress.  HENT:  Head: Normocephalic and atraumatic. Head is without right periorbital erythema and without left periorbital erythema.  Right Ear: Tympanic membrane, external ear and ear canal normal. No drainage. No foreign bodies. Tympanic membrane is not injected, not scarred, not perforated, not erythematous, not retracted and not bulging. No middle ear effusion.  Left Ear: Tympanic membrane, external ear and ear canal normal. No drainage. No foreign bodies. Tympanic membrane is not injected, not scarred, not perforated, not erythematous, not retracted and not bulging.  No middle ear effusion.  Nose: Nose normal. No mucosal edema, rhinorrhea, nose lacerations, sinus tenderness, nasal  deformity, septal deviation or nasal septal hematoma. No epistaxis.  Mouth/Throat: Oropharynx is clear and moist and mucous membranes are normal. No oropharyngeal exudate, posterior oropharyngeal edema, posterior oropharyngeal erythema or tonsillar abscesses.  Eyes: Conjunctivae and lids are normal. Pupils are equal, round, and reactive to light. Right eye exhibits no discharge and no exudate. No foreign body present in the right eye. Left eye exhibits no discharge and no exudate. No foreign body present in the left eye. Right conjunctiva is not injected. Right conjunctiva has no hemorrhage. Left conjunctiva is not injected. Left conjunctiva has no hemorrhage. No scleral icterus.  Neck: No tracheal tenderness present. No tracheal deviation present. No thyromegaly present.  Cardiovascular: Normal rate, regular rhythm, S1 normal, S2 normal and normal heart sounds.  Exam reveals no gallop and no friction rub.   No murmur heard. Pulmonary/Chest:  Effort normal. No stridor. No respiratory distress. She has no wheezes. She has no rhonchi. She has no rales. She exhibits no tenderness.  Musculoskeletal: She exhibits no edema or tenderness.  Lymphadenopathy:    She has no cervical adenopathy.  Skin: No purpura and no rash noted. Rash is not macular, not maculopapular, not nodular, not pustular, not vesicular and not urticarial. She is not diaphoretic. No cyanosis or erythema. No pallor. Nails show no clubbing.  Psychiatric: Mood, affect and judgment normal.    Diagnostics:    Spirometry was performed and demonstrated an FEV1 of 1.45 at 77 % of predicted.  The patient had an Asthma Control Test with the following results:   Not completed.    Assessment and Plan:   1. Moderate persistent asthma, uncomplicated   2. Other allergic rhinitis   3. Other headache syndrome     1. Arrange for neurology consult for headache  2. Continue Asmanex 220 one inhalation one time per day  3. Continue ventolin HFA  2 puffs every 4-6 hours if needed.  4. Continue nasal saline and systane eye drops if needed.  5. Continue 'action plan' for asthma flare with Asmanex 220 two inhalations two times per day.  6. Get a flu vaccine  7. Return in four months or earlier if problem   We will have Ka[place] evaluated by a neurologist for her headache which may be trigeminal in nature or maybe TMG in nature.      Allena Katz, MD [facility]

## 2015-09-27 ENCOUNTER — Encounter: Payer: Self-pay | Admitting: Cardiovascular Disease

## 2015-09-29 ENCOUNTER — Ambulatory Visit (INDEPENDENT_AMBULATORY_CARE_PROVIDER_SITE_OTHER): Payer: Medicare Other | Admitting: *Deleted

## 2015-09-29 DIAGNOSIS — R002 Palpitations: Secondary | ICD-10-CM

## 2015-09-29 LAB — CUP PACEART REMOTE DEVICE CHECK: Date Time Interrogation Session: 20161019223819

## 2015-09-30 ENCOUNTER — Ambulatory Visit (INDEPENDENT_AMBULATORY_CARE_PROVIDER_SITE_OTHER): Payer: Medicare Other | Admitting: Neurology

## 2015-09-30 ENCOUNTER — Encounter: Payer: Self-pay | Admitting: Neurology

## 2015-09-30 VITALS — BP 117/73 | HR 85 | Ht 60.0 in | Wt 132.0 lb

## 2015-09-30 DIAGNOSIS — R51 Headache: Secondary | ICD-10-CM

## 2015-09-30 DIAGNOSIS — R42 Dizziness and giddiness: Secondary | ICD-10-CM

## 2015-09-30 DIAGNOSIS — H543 Unqualified visual loss, both eyes: Secondary | ICD-10-CM

## 2015-09-30 DIAGNOSIS — R519 Headache, unspecified: Secondary | ICD-10-CM

## 2015-09-30 DIAGNOSIS — D329 Benign neoplasm of meninges, unspecified: Secondary | ICD-10-CM

## 2015-09-30 DIAGNOSIS — H542X22 Low vision right eye category 2, low vision left eye category 2: Secondary | ICD-10-CM | POA: Insufficient documentation

## 2015-09-30 MED ORDER — TOPIRAMATE 25 MG PO CPSP: 25.0000 mg | ORAL_CAPSULE | Freq: Two times a day (BID) | ORAL | Status: DC

## 2015-09-30 NOTE — Patient Instructions (Addendum)
Remember to drink plenty of fluid, eat healthy meals and do not skip any meals. Try to eat protein with a every meal and eat a healthy snack such as fruit or nuts in between meals. Try to keep a regular sleep-wake schedule and try to exercise daily, particularly in the form of walking, 20-30 minutes a day, if you can.   As far as your medications are concerned, I would like to suggest: Start Topamax as prescribed  As far as diagnostic testing: MRI of the brain  I would like to see you back after imaging, sooner if we need to. Please call us with any interim questions, concerns, problems, updates or refill requests.   Our phone number is [phone]. We also have an after hours call service for urgent matters and there is a physician on-call for urgent questions. For any emergencies you know to call 911 or go to the nearest emergency room

## 2015-09-30 NOTE — Progress Notes (Signed)
LOOP RECORDER  

## 2015-09-30 NOTE — Progress Notes (Addendum)
[id number]    Provider:  Dr Jaynee Eagles Referring Provider: Nolene Ebbs, MD Primary Care Physician:  Philis Fendt, MD  CC:  Headaches  Addendum 10/18/2015: MRi showed a small incidental meningioma. I do not believe this is causing any problems at all, it has been stable since 2014. Patient insists this is the cause of her symptoms and insists on a neurosurgical evaluation.  MRi of the brain: This MRI of the brain with and without contrast shows the following: 1. 7 mm round extra-axial dural based enhancing mass outside the right frontal lobe adjacent to the falx cerebri consistent with a small meningioma. The adjacent brain appears normal and this would likely be asymptomatic. When compared to CT scan dated 09/23/2013, this mass appears calcified and stable in size during the interim. 2. The brain appears normal for age. 3. There are no acute findings   HPI:  Autumn Fitzgerald is a 69 y.o. female here as a referral from Dr. Jeanie Cooks for headaches. She has a PMHx of  Panic attacks, depression,anxiety,CHF,HLD,asthma,HTN,hyperthyroidism.  She started having headache in August, pain in the left ear with inflammation in the ear. She previously saw Dr. Rexene Alberts in 2014 for chronic headaches but she says this is different. She has pain in the left temporal area. It "jumps" nd moves around like something is moving in her head. She has significant pain. She has pain behind the eyes, she can't see well, she gets dizzy, it has jumped to the other. She does not have recent fevers. She can't even touch the phone to her ear because it hurts. The headaches are continuous, every day and every night. Her vision is getting worse. Lots of fatigue. Headaches are worse at night, the headache wakes her up. The light bother her. She has to stay home in the dark. She gets nausea. No aura but she sometimes sees black spots with vision loss. She has had vision changes. These started in August with  pain in the ear. She takes tylenol. She can fall asleep bc she takes medicatuon to sleep. She has dizziness. She has tremors.   Discussed depakote but her bilirubin is elevated, her qtc is 471 and given her age would not like to start a TCA, can try topamax at night.   09/11/2013:  Initial H&P by Dr. Rexene Alberts   I saw your patient, Autumn Fitzgerald, upon your kind request in my neurologic clinic today for initial consultation of her headaches after a fall and memory loss as well as dizziness. The patient is accompanied by her husband today. As you know, Ms. Moorehead is a very friendly 69 year old right-handed woman with an underlying medical history of chronic back pain and cervical as well as lumbar degenerative disc disease, hyperlipidemia, reflux disease, recurrent headaches, asthma, anxiety and depression as well as congestive heart failure, who fell in the bathtub in 3/14 and hit her head in the front. She did not have LOC. Some 2 years ago, she fell and hit the front of her head in the front. She did not have LOC. She has not had a CTH or MRI. She has had forgetfulness since the fall in March. Her mother had AD. She endorses mostly short term memory issues, no confusion, delusions, hallucinations. She takes medicine for anxiety and depression and panic attack and sees Dr. Casimiro Needle. She has a implanted loop recorder, which was placed several months ago. She is divorced and lives alone. She has 4 grown children. All of them in [place]. She  does not drive.   She has a HA every day, generalized, more in the vertex and bifrontal, and aching, not pounding, no N/V and with photophobia. She takes Klonopin 2 mg qHS, which helps her sleep for about 4 hours, she goes to bed late, between 12 MN and 1 AM. She snores some, but denies choking and has never been told, that she has apneic pauses.    Review of Systems: Patient complains of symptoms per HPI as well as the following symptoms: chills, weight gain, fatigue,  blurred vision, loss of vision, eye pain, CP, palpitations, swelling in legs, SOB, cough, feeling hot, feeling cold, increased thirst, flushing, hearing loss, ringing in ears, spinning sensation, joint pain, aching muscles, rash, itching, allergies, runny nose, memory loss, confusion, headache, weakness, sleepiness, dizziness, tremor, depression, anxiety, not enough sleep, decreased energy, change in appetite, disinterest in activities, racing thoughts. Pertinent negatives per HPI. All others negative.   Social History   Social History  . Marital Status: Divorced    Spouse Name: N/A  . Number of Children: 5  . Years of Education: 10   Occupational History  . Not on file.   Social History Main Topics  . Smoking status: Never Smoker   . Smokeless tobacco: Never Used  . Alcohol Use: No  . Drug Use: No  . Sexual Activity: No     Comment: divorced   Other Topics Concern  . Not on file   Social History Narrative   Lives at home by herself.    Caffeine use: none     Family History  Problem Relation Age of Onset  . Cancer Mother     breast  . Hypertension Mother   . Cancer Father     prostate  . Diabetes Sister   . Heart attack Sister   . Migraines Neg Hx     Past Medical History  Diagnosis Date  . Panic attacks   . 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 GERD (gastroesophageal reflux disease)   . HLD (hyperlipidemia)   . Asthma   . Palpitations   . Near syncope   . Chest wall pain   . Hypertension     at times,  . Mental disorder   . Depression   . Head injury, closed, without LOC 02/2013    did not LOC per patient  . Arthritis   . Hyperthyroidism   . Headache     Past Surgical History  Procedure Laterality Date  . 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 Tubal ligation    . Cholecystectomy    . Loop recorder implant  03/18/2013  . Septal myectomy    . Nuclear stress test  11/30/2011    No ischemia  . Doppler echocardiography  03/14/2013     EF 65-70%,trivial AI,mild-mod. MR,Mod. TR,mod. concentric hypertrophy  . Knee arthroscopy Right 10/17/2013    Procedure: ARTHROSCOPY RIGHT KNEE;  Surgeon: [name], MD;  Location: [facility];  Service: Orthopedics;  Laterality: Right;  . Esophagogastroduodenoscopy N/A 11/24/2013    Procedure: ESOPHAGOGASTRODUODENOSCOPY (EGD);  Surgeon: Juanita Craver, MD;  Location: [facility];  Service: Endoscopy;  Laterality: N/A;  . Colonoscopy N/A 11/24/2013    Procedure: COLONOSCOPY;  Surgeon: Juanita Craver, MD;  Location: [facility];  Service: Endoscopy;  Laterality: N/A;  . Loop recorder implant N/A 03/18/2013    Procedure: LOOP RECORDER IMPLANT;  Surgeon: Sanda Klein, MD;  Location: [facility];  Service: Cardiovascular;  Laterality: N/A;    Current  Outpatient Prescriptions  Medication Sig Dispense Refill  . albuterol (PROVENTIL HFA;VENTOLIN HFA) 108 (90 BASE) MCG/ACT inhaler Inhale 2 puffs into the lungs every 6 (six) hours as needed for wheezing or shortness of breath.    Marland Kitchen aspirin 81 MG tablet Take 81 mg by mouth daily.    Marland Kitchen atenolol (TENORMIN) 25 MG tablet Take 0.5 tablets (12.5 mg total) by mouth daily as needed (for high blood pressure). 45 tablet 3  . Cholecalciferol (VITAMIN D-3) 5000 UNITS TABS Take 5,000 Units by mouth daily.     . clobetasol cream (TEMOVATE) 8.11 % Apply 1 application topically 2 (two) times a week. 30 g 2  . clonazePAM (KLONOPIN) 1 MG tablet Take 1-2 mg by mouth 2 (two) times daily. Take 0.5 tablet (0.5 mg) during the day and 2 tablets (2 mg) at bedtime    . ezetimibe-simvastatin (VYTORIN) 10-40 MG per tablet Take 0.5 tablets by mouth at bedtime.     Marland Kitchen levothyroxine (SYNTHROID) 50 MCG tablet Take 50 mcg by mouth daily.     . Misc Natural Products (OSTEO BI-FLEX ADV DOUBLE ST) TABS Take 1 tablet by mouth 2 (two) times daily.     . mometasone (ASMANEX) 220 MCG/INH inhaler Inhale 1 puff into the lungs daily as needed (for shortness of breath).     . Multiple Vitamin  (MULTIVITAMIN) tablet Take 1 tablet by mouth daily.    . Probiotic Product (PROBIOTIC DAILY PO) Take 1 capsule by mouth daily.    Marland Kitchen Propylene Glycol (SYSTANE BALANCE OP) Apply 1 drop to eye 2 times daily at 12 noon and 4 pm.    . UNABLE TO FIND Med Name: IBgard (for IBS)    . topiramate (TOPAMAX) 25 MG capsule Take 1 capsule (25 mg total) by mouth 2 (two) times daily. Take one tablet morning for 7 days then increase to twice a day 60 capsule 3   No current facility-administered medications for this visit.    Allergies as of 09/30/2015 - Review Complete 09/30/2015  Allergen Reaction Noted  . Amoxicillin-pot clavulanate  06/10/2014  . Statins Other (See Comments) 06/18/2014    Vitals: BP 117/73 mmHg  Pulse 85  Ht 5' (1.524 m)  Wt 132 lb (59.875 kg)  BMI 25.78 kg/m2 Last Weight:  Wt Readings from Last 1 Encounters:  09/30/15 132 lb (59.875 kg)   Last Height:   Ht Readings from Last 1 Encounters:  09/30/15 5' (1.524 m)     Physical exam: Exam: Gen: NAD, conversant, well nourised, obese, well groomed                     Eyes: Conjunctivae clear without exudates or hemorrhage  Neuro: Detailed Neurologic Exam  Speech:    Speech is normal; fluent and spontaneous with normal comprehension.  Cognition:    The patient is oriented to person, place, and time;  Cranial Nerves:    The pupils are equal, round, and reactive to light. The fundi are flat Visual fields are full to finger confrontation. Extraocular movements are intact. Trigeminal sensation is intact and the muscles of mastication are normal. The face is symmetric. The palate elevates in the midline. Hearing intact. Voice is normal. Shoulder shrug is normal. The tongue has normal motion without fasciculations.   Motor Observation:    No asymmetry, no atrophy, and no involuntary movements noted. Tone:    Normal muscle tone.    Posture:    Posture is normal. normal erect  Strength:    Strength is V/V in the upper  and lower limbs.      Sensation: intact to LT     Toes:    The toes are downgoing bilaterally.   Clonus:    Clonus is absent.  Assessment and plan:   69 y.o. female here as a referral from Dr. Jeanie Cooks for continuous, daily headaches. She has a PMHx of  Panic attacks, depression,anxiety,CHF,HLD,asthma,HTN,hyperthyroidism.  She previously saw Dr. Rexene Alberts in 2014 for chronic headaches but she says this is different. She has pain in the left temporal area, pain behind the eyes, dizziness, scalp sensitivity, vision changes,  Fatigue, light sensitivity, nausea. She was diagnosed in the past with chronic headaches and post-traumatic headache by Dr. Rexene Alberts who ordered a CT of the head and a trial of Elavil but does not appear she followed up after that.   Will order an MRi of the brain. Will check crp and esr to eval for temporal arteritis. Discussed depakote but her bilirubin is elevated, her qtc is 471 and given her age would not like to start a TCA, can try topamax at night.   Addendum 10/18/2015: MRi showed a small incidental meningioma. I do not believe this is causing any problems at all, it has been stable since 2014. Patient insists this is the cause of her symptoms and insists on a neurosurgical evaluation. Patient declines trying any new medication for her symptoms.  MRi of the brain: This MRI of the brain with and without contrast shows the following: 1. 7 mm round extra-axial dural based enhancing mass outside the right frontal lobe adjacent to the falx cerebri consistent with a small meningioma. The adjacent brain appears normal and this would likely be asymptomatic. When compared to CT scan dated 09/23/2013, this mass appears calcified and stable in size during the interim. 2. The brain appears normal for age. 3. There are no acute findings    Sarina Ill, MD  [facility] [address] [place], [place] [address]  Phone [phone] Fax  [phone]  A total of 30 minutes was spent face-to-face with this patient. Over half this time was spent on counseling patient on the headache diagnosis and different diagnostic and therapeutic options available.

## 2015-10-01 LAB — COMPREHENSIVE METABOLIC PANEL
ALT: 26 IU/L (ref 0–32)
AST: 31 IU/L (ref 0–40)
Albumin/Globulin Ratio: 1.8 (ref 1.1–2.5)
Albumin: 4.4 g/dL (ref 3.5–4.8)
Alkaline Phosphatase: 112 IU/L (ref 39–117)
BUN/Creatinine Ratio: 13 (ref 11–26)
BUN: 9 mg/dL (ref 8–27)
Bilirubin Total: 1.3 mg/dL — ABNORMAL HIGH (ref 0.0–1.2)
CO2: 24 mmol/L (ref 18–29)
Calcium: 9.1 mg/dL (ref 8.7–10.3)
Chloride: 102 mmol/L (ref 97–106)
Creatinine, Ser: 0.69 mg/dL (ref 0.57–1.00)
GFR calc Af Amer: 102 mL/min/{1.73_m2} (ref >59)
GFR calc non Af Amer: 88 mL/min/{1.73_m2} (ref >59)
Globulin, Total: 2.4 g/dL (ref 1.5–4.5)
Glucose: 87 mg/dL (ref 65–99)
Potassium: 4.4 mmol/L (ref 3.5–5.2)
Sodium: 143 mmol/L (ref 136–144)
Total Protein: 6.8 g/dL (ref 6.0–8.5)

## 2015-10-01 LAB — C-REACTIVE PROTEIN: CRP: 0.8 mg/L (ref 0.0–4.9)

## 2015-10-01 LAB — SEDIMENTATION RATE: Sed Rate: 2 mm/hr (ref 0–40)

## 2015-10-02 ENCOUNTER — Encounter: Payer: Self-pay | Admitting: Neurology

## 2015-10-04 ENCOUNTER — Telehealth: Payer: Self-pay | Admitting: *Deleted

## 2015-10-04 NOTE — Telephone Encounter (Signed)
-----   Message from Melvenia Beam, MD sent at 10/04/2015  7:40 AM EDT ----- Labs normal except her bilirubin is mildly elevated which is a liver enzyme however it is much better than a year ago (now 1.3, a year ago 2.1). She should continue to follow with her pcp thanks

## 2015-10-04 NOTE — Telephone Encounter (Signed)
Left VM for pt to call about lab results. Gave [facility] phone number.

## 2015-10-04 NOTE — Telephone Encounter (Signed)
Called pt to let her know bilirubin is midly elevated at 1.3, which is a liver enzyme. It has improved from last year. It was 2.1. She needs to f/u with her PCP. She verbalized understanding. Advised she can sign a release form to get results in our office or in Dr. Jeanie Cooks (PCP per pt) office to get results. She verbalized understanding.

## 2015-10-15 ENCOUNTER — Ambulatory Visit
Admission: RE | Admit: 2015-10-15 | Discharge: 2015-10-15 | Disposition: A | Discharge status: Home / Self Care | Payer: Medicare Other | Source: Ambulatory Visit | Attending: Neurology | Admitting: Neurology

## 2015-10-15 ENCOUNTER — Other Ambulatory Visit: Payer: Self-pay | Admitting: *Deleted

## 2015-10-15 DIAGNOSIS — R42 Dizziness and giddiness: Secondary | ICD-10-CM

## 2015-10-15 DIAGNOSIS — R51 Headache: Principal | ICD-10-CM

## 2015-10-15 DIAGNOSIS — R519 Headache, unspecified: Secondary | ICD-10-CM

## 2015-10-15 DIAGNOSIS — H543 Unqualified visual loss, both eyes: Secondary | ICD-10-CM | POA: Diagnosis not present

## 2015-10-15 MED ORDER — GADOBENATE DIMEGLUMINE 529 MG/ML IV SOLN
10.0000 mL | Freq: Once | INTRAVENOUS | Status: AC | PRN
  Administered 2015-10-15: 10 mL via INTRAVENOUS

## 2015-10-15 MED ORDER — ALBUTEROL SULFATE HFA 108 (90 BASE) MCG/ACT IN AERS: INHALATION_SPRAY | RESPIRATORY_TRACT | Status: DC

## 2015-10-18 ENCOUNTER — Telehealth: Payer: Self-pay | Admitting: *Deleted

## 2015-10-18 DIAGNOSIS — R7301 Impaired fasting glucose: Secondary | ICD-10-CM | POA: Diagnosis not present

## 2015-10-18 DIAGNOSIS — Z13220 Encounter for screening for lipoid disorders: Secondary | ICD-10-CM | POA: Diagnosis not present

## 2015-10-18 DIAGNOSIS — D320 Benign neoplasm of cerebral meninges: Secondary | ICD-10-CM | POA: Diagnosis not present

## 2015-10-18 DIAGNOSIS — J4520 Mild intermittent asthma, uncomplicated: Secondary | ICD-10-CM | POA: Diagnosis not present

## 2015-10-18 NOTE — Telephone Encounter (Signed)
-----   Message from Melvenia Beam, MD sent at 10/17/2015  1:20 PM EST ----- Patient has a meningioma that is stable since 2014 and not causing any problems. Otherwise normal MRI. Please let patient know thanks

## 2015-10-18 NOTE — Addendum Note (Signed)
Addended by: Sarina Ill B on: 10/18/2015 12:40 PM   Modules accepted: Orders

## 2015-10-18 NOTE — Telephone Encounter (Signed)
Spoke to patient. MRi showed a small incidental meningioma. I do not believe this is causing any problems at all, it has been stable since 2014. Patient insists this is the cause of her symptoms and insists on a neurosurgical evaluation. Tried to reassure patient.  She declined trying any new medication for her symptoms.  MRi of the brain: This MRI of the brain with and without contrast shows the following: 1. 7 mm round extra-axial dural based enhancing mass outside the right frontal lobe adjacent to the falx cerebri consistent with a small meningioma. The adjacent brain appears normal and this would likely be asymptomatic. When compared to CT scan dated 09/23/2013, this mass appears calcified and stable in size during the interim. 2. The brain appears normal for age. 3. There are no acute findings

## 2015-10-18 NOTE — Telephone Encounter (Signed)
Called and spoke to pt. Relayed Dr Jaynee Eagles message that meningioma stable back from 2014 and not causing problems. Pt was not aware of this when I called. Dr Jaynee Eagles spoke with pt about finding. Otherwise normal MRI. Pt would like referral to neurosurgery and has a PCP appt at 2pm today. Told her she should hear from neurosurgery to schedule appt within the next week or two. She verbalized understanding.

## 2015-10-19 NOTE — Progress Notes (Signed)
Carelink summary report received.  Battery status OK.  Normal device function.  No new symptom episodes, tachy episodes, brady, or pause episodes.  No new AF episodes.  Monthly summary reports and ROV with [facility] in 07/2016. 

## 2015-10-29 ENCOUNTER — Ambulatory Visit (INDEPENDENT_AMBULATORY_CARE_PROVIDER_SITE_OTHER): Payer: Medicare Other | Admitting: *Deleted

## 2015-10-29 DIAGNOSIS — R002 Palpitations: Secondary | ICD-10-CM | POA: Diagnosis not present

## 2015-10-29 LAB — CUP PACEART REMOTE DEVICE CHECK: Date Time Interrogation Session: 20161118230854

## 2015-11-01 NOTE — Progress Notes (Signed)
LOOP RECORDER  

## 2015-11-02 ENCOUNTER — Telehealth: Payer: Self-pay | Admitting: Neurology

## 2015-11-02 NOTE — Telephone Encounter (Signed)
Called and made f/u appt for 11/28 at 230pm for 30 minutes. Pt knows to check in by 215pm.

## 2015-11-02 NOTE — Telephone Encounter (Signed)
Autumn Fitzgerald, call and schedule an appointment please thanks

## 2015-11-02 NOTE — Telephone Encounter (Signed)
Dr. Redmond Baseman has called and is requesting a call back. He will be out of the office soon and the pt is calling him for information. He would like a call back as soon as possible. He will be out of the office the rest of the week. May call [phone].

## 2015-11-02 NOTE — Telephone Encounter (Signed)
Dr. Redmond Baseman called and would like to touch base about the pt. He says pt has pain around ear. He states that pts ear is fine though. Wanted to see what she thought. Sedimentation rate was normal. May call cell # [phone].

## 2015-11-08 ENCOUNTER — Ambulatory Visit (INDEPENDENT_AMBULATORY_CARE_PROVIDER_SITE_OTHER): Payer: Medicare Other | Admitting: Neurology

## 2015-11-08 ENCOUNTER — Encounter: Payer: Self-pay | Admitting: Neurology

## 2015-11-08 VITALS — BP 134/81 | HR 79 | Ht 60.0 in | Wt 135.6 lb

## 2015-11-08 DIAGNOSIS — G43011 Migraine without aura, intractable, with status migrainosus: Secondary | ICD-10-CM | POA: Diagnosis not present

## 2015-11-08 NOTE — Patient Instructions (Signed)
Remember to drink plenty of fluid, eat healthy meals and do not skip any meals. Try to eat protein with a every meal and eat a healthy snack such as fruit or nuts in between meals. Try to keep a regular sleep-wake schedule and try to exercise daily, particularly in the form of walking, 20-30 minutes a day, if you can.   As far as your medications are concerned, I would like to suggest: will refer to [facility]  Our phone number is [phone]. We also have an after hours call service for urgent matters and there is a physician on-call for urgent questions. For any emergencies you know to call 911 or go to the nearest emergency room

## 2015-11-08 NOTE — Progress Notes (Signed)
[facility]    Provider: Dr Jaynee Eagles Referring Provider: Nolene Ebbs, MD Primary Care Physician: Philis Fendt, MD  WE:[id number] Headaches  Interval history 11/08/2015: She would like to have botox for her headaches. The headaches are daily, they last continuously every day, intractable. She cannot tolerate any medications and she declines anymore trials of oral medications. She has failed Topiramate, Nortriptyline, and her primary care gave her butalbital and that made her sick and dizzy. The headaches did not improve. They are migrainous, light bothers her, she has nausea, no vomiting, she has pain on the left side of the head and face and neck and is throbbing, she can't talk on the phone, being in s dark room helps. She needs quiet and to lay still. Since she can't tolerate any medications and wants botox. She would like to go to the [facility]. The headaches are daily and going on daily for 4 months. No medication overuse headaches. crp and esr was normal. She also failed prozac.   Addendum 10/18/2015: MRi showed a small incidental meningioma. I do not believe this is causing any problems at all, it has been stable since 2014. Patient insists this is the cause of her symptoms and insists on a neurosurgical evaluation.  MRi of the brain: This MRI of the brain with and without contrast shows the following: 1. 7 mm round extra-axial dural based enhancing mass outside the right frontal lobe adjacent to the falx cerebri consistent with a small meningioma. The adjacent brain appears normal and this would likely be asymptomatic. When compared to CT scan dated 09/23/2013, this mass appears calcified and stable in size during the interim. 2. The brain appears normal for age. 3. There are no acute findings   HPI: Autumn Fitzgerald is a 69 y.o. female here as a referral from Dr. Jeanie Cooks for headaches. She has a PMHx of Panic attacks,  depression,anxiety,CHF,HLD,asthma,HTN,hyperthyroidism. She started having headache in August, pain in the left ear with inflammation in the ear. She previously saw Dr. Rexene Alberts in 2014 for chronic headaches but she says this is different. She has pain in the left temporal area. It "jumps" nd moves around like something is moving in her head. She has significant pain. She has pain behind the eyes, she can't see well, she gets dizzy, it has jumped to the other. She does not have recent fevers. She can't even touch the phone to her ear because it hurts. The headaches are continuous, every day and every night. Her vision is getting worse. Lots of fatigue. Headaches are worse at night, the headache wakes her up. The light bother her. She has to stay home in the dark. She gets nausea. No aura but she sometimes sees black spots with vision loss. She has had vision changes. These started in August with pain in the ear. She takes tylenol. She can fall asleep bc she takes medicatuon to sleep. She has dizziness. She has tremors.   Discussed depakote but her bilirubin is elevated, her qtc is 471 and given her age would not like to start a TCA, can try topamax at night.   09/11/2013: Initial H&P by Dr. Rexene Alberts   I saw your patient, Autumn Fitzgerald, upon your kind request in my neurologic clinic today for initial consultation of her headaches after a fall and memory loss as well as dizziness. The patient is accompanied by her husband today. As you know, [name] is a very friendly 69 year old right-handed woman with an underlying medical  history of chronic back pain and cervical as well as lumbar degenerative disc disease, hyperlipidemia, reflux disease, recurrent headaches, asthma, anxiety and depression as well as congestive heart failure, who fell in the bathtub in 3/14 and hit her head in the front. She did not have LOC. Some 2 years ago, she fell and hit the front of her head in the front. She did not have LOC. She has  not had a CTH or MRI. She has had forgetfulness since the fall in March. Her mother had AD. She endorses mostly short term memory issues, no confusion, delusions, hallucinations. She takes medicine for anxiety and depression and panic attack and sees Dr. Casimiro Needle. She has a implanted loop recorder, which was placed several months ago. She is divorced and lives alone. She has 4 grown children. All of them in [place]. She does not drive.   She has a HA every day, generalized, more in the vertex and bifrontal, and aching, not pounding, no N/V and with photophobia. She takes Klonopin 2 mg qHS, which helps her sleep for about 4 hours, she goes to bed late, between 12 MN and 1 AM. She snores some, but denies choking and has never been told, that she has apneic pauses.    Review of Systems: Patient complains of symptoms per HPI as well as the following symptoms: chills, weight gain, fatigue, blurred vision, loss of vision, eye pain, CP, palpitations, swelling in legs, SOB, cough, feeling hot, feeling cold, increased thirst, flushing, hearing loss, ringing in ears, spinning sensation, joint pain, aching muscles, rash, itching, allergies, runny nose, memory loss, confusion, headache, weakness, sleepiness, dizziness, tremor, depression, anxiety, not enough sleep, decreased energy, change in appetite, disinterest in activities, racing thoughts. Pertinent negatives per HPI. All others negative.   Social History   Social History  . Marital Status: Divorced    Spouse Name: N/A  . Number of Children: 5  . Years of Education: 10   Occupational History  . Not on file.   Social History Main Topics  . Smoking status: Never Smoker   . Smokeless tobacco: Never Used  . Alcohol Use: No  . Drug Use: No  . Sexual Activity: No     Comment: divorced   Other Topics Concern  . Not on file   Social History Narrative   Lives at home by herself.    Caffeine use: none     Family History  Problem Relation Age  of Onset  . Cancer Mother     breast  . Hypertension Mother   . Cancer Father     prostate  . Diabetes Sister   . Heart attack Sister   . Migraines Neg Hx     Past Medical History  Diagnosis Date  . Panic attacks   . 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 GERD (gastroesophageal reflux disease)   . HLD (hyperlipidemia)   . Asthma   . Palpitations   . Near syncope   . Chest wall pain   . Hypertension     at times,  . Mental disorder   . Depression   . Head injury, closed, without LOC 02/2013    did not LOC per patient  . Arthritis   . Hyperthyroidism   . Headache     Past Surgical History  Procedure Laterality Date  . 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 Tubal ligation    . Cholecystectomy    . Loop recorder implant  03/18/2013  . Septal myectomy    .  Nuclear stress test  11/30/2011    No ischemia  . Doppler echocardiography  03/14/2013    EF 65-70%,trivial AI,mild-mod. MR,Mod. TR,mod. concentric hypertrophy  . Knee arthroscopy Right 10/17/2013    Procedure: ARTHROSCOPY RIGHT KNEE;  Surgeon: [name], MD;  Location: [facility];  Service: Orthopedics;  Laterality: Right;  . Esophagogastroduodenoscopy N/A 11/24/2013    Procedure: ESOPHAGOGASTRODUODENOSCOPY (EGD);  Surgeon: Juanita Craver, MD;  Location: [facility];  Service: Endoscopy;  Laterality: N/A;  . Colonoscopy N/A 11/24/2013    Procedure: COLONOSCOPY;  Surgeon: Juanita Craver, MD;  Location: [facility];  Service: Endoscopy;  Laterality: N/A;  . Loop recorder implant N/A 03/18/2013    Procedure: LOOP RECORDER IMPLANT;  Surgeon: Sanda Klein, MD;  Location: [facility];  Service: Cardiovascular;  Laterality: N/A;    Current Outpatient Prescriptions  Medication Sig Dispense Refill  . albuterol (PROVENTIL HFA;VENTOLIN HFA) 108 (90 BASE) MCG/ACT inhaler INHALE TWO PUFFS EVERY 4-6 HOURS AS NEEDED FOR COUGH OR WHEEZE. 1 Inhaler 1  . aspirin 81 MG tablet Take 81 mg by mouth daily.    Marland Kitchen atenolol  (TENORMIN) 25 MG tablet Take 0.5 tablets (12.5 mg total) by mouth daily as needed (for high blood pressure). 45 tablet 3  . Cholecalciferol (VITAMIN D-3) 5000 UNITS TABS Take 5,000 Units by mouth daily.     . clobetasol cream (TEMOVATE) 6.94 % Apply 1 application topically 2 (two) times a week. 30 g 2  . clonazePAM (KLONOPIN) 1 MG tablet Take 1-2 mg by mouth 2 (two) times daily. Take 0.5 tablet (0.5 mg) during the day and 2 tablets (2 mg) at bedtime    . ezetimibe-simvastatin (VYTORIN) 10-40 MG per tablet Take 0.5 tablets by mouth at bedtime.     Marland Kitchen levothyroxine (SYNTHROID) 50 MCG tablet Take 50 mcg by mouth daily.     . Misc Natural Products (OSTEO BI-FLEX ADV DOUBLE ST) TABS Take 1 tablet by mouth 2 (two) times daily.     . mometasone (ASMANEX) 220 MCG/INH inhaler Inhale 1 puff into the lungs daily as needed (for shortness of breath).     . Multiple Vitamin (MULTIVITAMIN) tablet Take 1 tablet by mouth daily.    . Probiotic Product (PROBIOTIC DAILY PO) Take 1 capsule by mouth daily.    Marland Kitchen Propylene Glycol (SYSTANE BALANCE OP) Apply 1 drop to eye 2 times daily at 12 noon and 4 pm.    . UNABLE TO FIND Med Name: IBgard (for IBS)     No current facility-administered medications for this visit.    Allergies as of 11/08/2015 - Review Complete 11/08/2015  Allergen Reaction Noted  . Amoxicillin-pot clavulanate  06/10/2014  . Statins Other (See Comments) 06/18/2014    Vitals: BP 134/81 mmHg  Pulse 79  Ht 5' (1.524 m)  Wt 135 lb 9.6 oz (61.508 kg)  BMI 26.48 kg/m2 Last Weight:  Wt Readings from Last 1 Encounters:  11/08/15 135 lb 9.6 oz (61.508 kg)   Last Height:   Ht Readings from Last 1 Encounters:  11/08/15 5' (1.524 m)    Cranial Nerves:  The pupils are equal, round, and reactive to light. The fundi are flat Visual fields are full to finger confrontation. Extraocular movements are intact. Trigeminal sensation is intact and the muscles of mastication are normal. The face is  symmetric. The palate elevates in the midline. Hearing intact. Voice is normal. Shoulder shrug is normal. The tongue has normal motion without fasciculations.   Motor Observation:  No asymmetry, no  atrophy, and no involuntary movements noted. Tone:  Normal muscle tone.   Posture:  Posture is normal. normal erect   Strength:  Strength is V/V in the upper and lower limbs.    Sensation: intact to LT   Toes:  The toes are downgoing bilaterally.  Clonus:  Clonus is absent.  Assessment and plan: 69 y.o. female here as a referral from Dr. Jeanie Cooks for continuous, daily headaches. She has a PMHx of Panic attacks, depression,anxiety,CHF,HLD,asthma,HTN, hyperthyroidism. The headaches are daily, they last continuously every day, intractable. She cannot tolerate any medications and she declines any further oral medications trial. She has failed Topiramate, Nortriptyline, prozac. Headaches are migrainous, light bothers her, she has nausea, no vomiting, she has pain on the left side of the head and face and neck that is throbbing, she can't talk on the phone, being in a dark room helps with quiet. Since she can't tolerate any medications and wants botox.  The headaches are daily and going on daily for 4 months. No overuse headaches. crp and esr was normal.   She would like to go to the [facility] center.   Cc: Dr. Melida Quitter, [facility].   Sarina Ill, MD  [facility] [address] [place], [place] [address]  Phone [phone] Fax [phone]  A total of 30 minutes was spent face-to-face with this patient. Over half this time was spent on counseling patient on the migraine diagnosis and different diagnostic and therapeutic options available.

## 2015-11-09 ENCOUNTER — Telehealth: Payer: Self-pay | Admitting: Neurology

## 2015-11-09 DIAGNOSIS — G43011 Migraine without aura, intractable, with status migrainosus: Secondary | ICD-10-CM

## 2015-11-09 NOTE — Telephone Encounter (Signed)
Called Autumn Fitzgerald back. Autumn Fitzgerald advised that [facility] does not do botox for her type of insurance. Advised I will send another referral to Dr Letta Pate office located at:  Address: [address], [place], [place] [address] Phone: [phone] (gave Autumn Fitzgerald this number to call) Autumn Fitzgerald verbalized understanding and appreciation.

## 2015-11-09 NOTE — Telephone Encounter (Signed)
Referral faxed to Dr Letta Pate office. Received fax confirmation.

## 2015-11-09 NOTE — Telephone Encounter (Signed)
Patient is calling and states that the facility @[id number]  [facility] will not do the Botox injections for her with Medicaid coverage.  Please call.

## 2015-11-10 NOTE — Telephone Encounter (Signed)
Rn call patient back and stated the covering md did not recommended any meds for her. Also the covering md would like for Dr.Ahern to evaluate her headaches. Rn stated a message will be sent to Dr.Ahern.

## 2015-11-10 NOTE — Telephone Encounter (Signed)
Pt called sts Dr Letta Pate office will take 6 weeks for her to be seen and he not sure if he will see her. She is requesting pain medication until she can be seen in that office.

## 2015-11-10 NOTE — Telephone Encounter (Signed)
Rn call patient back about her pain in her hand. Pt stated topamax does not work for her and Dr. Jaynee Eagles prescribed that in the past. Pt stated Dr. Justice Deeds office cant see until the end or mid December 2016. Pt stated she would like some pain medicine but nothing strong. Rn stated that Dr.Ahern does no do chronic pain. Rn stated to patient Dr.Ahern will be back in the office on Thursday. Rn consulted Dr. Leta Baptist who is the pm covering md and he stated to forward the message to him and he will review the patients chart.

## 2015-11-10 NOTE — Telephone Encounter (Signed)
i do not recommend any further meds at this time. Will defer to Dr. Jaynee Eagles to review when she returns to office. -VRP

## 2015-11-11 ENCOUNTER — Other Ambulatory Visit: Payer: Self-pay | Admitting: Neurology

## 2015-11-11 MED ORDER — DIVALPROEX SODIUM ER 250 MG PO TB24: 250.0000 mg | ORAL_TABLET | Freq: Every day | ORAL | Status: DC

## 2015-11-11 NOTE — Telephone Encounter (Signed)
Spoke to patient. She is not planning on having any children, she is menopausal. Discussed teratogenicity of Depakote, do not get pregnant, use BC. Discussed side effects and provided an UpToDate patient drug information handout. Side effects of Depakote include drowsiness, weakness, nausea, vomiting, stomach upset, diarrhea, constipation, mood swings, changes in menstrual periods, enlarged breasts, weight changes, agitation, tremor (shaking), vision changes, unusual or unpleasant taste in your mouth, and hair loss. Can cause liver damage.

## 2015-11-11 NOTE — Telephone Encounter (Signed)
Autumn Fitzgerald i can offer her a migraine cocktail today in the office if Autumn Fitzgerald can do it. I can also start her on Meloxicam daily temporarily. You can discuss the side effects with her, the most concerning is GI upset and she should take it with food and stop for any GI problems or dark stools.

## 2015-11-11 NOTE — Telephone Encounter (Signed)
Pt came to office for migraine cocktail Dr Jaynee Eagles aware.

## 2015-11-11 NOTE — Telephone Encounter (Addendum)
Spoke w/ pt and relayed Dr Jaynee Eagles message below. She has to call son to see if he can bring her to the office for migraine cocktail. She would like to start Meloxicam daily per Dr Jaynee Eagles recommendation. Discussed side effects. Pt will call office back to let me know if she can come for migraine cocktail. She cannot drive/does not have a car.

## 2015-11-30 ENCOUNTER — Ambulatory Visit (INDEPENDENT_AMBULATORY_CARE_PROVIDER_SITE_OTHER): Payer: Medicare Other | Admitting: *Deleted

## 2015-11-30 DIAGNOSIS — R002 Palpitations: Secondary | ICD-10-CM | POA: Diagnosis not present

## 2015-11-30 NOTE — Progress Notes (Signed)
Carelink Summary Report / Loop Recorder 

## 2015-12-15 ENCOUNTER — Other Ambulatory Visit: Payer: Self-pay | Admitting: Allergy and Immunology

## 2015-12-16 ENCOUNTER — Encounter: Payer: Self-pay | Admitting: Cardiology

## 2015-12-22 ENCOUNTER — Encounter: Payer: Self-pay | Admitting: Cardiology

## 2015-12-28 ENCOUNTER — Ambulatory Visit (INDEPENDENT_AMBULATORY_CARE_PROVIDER_SITE_OTHER): Payer: Medicare Other | Admitting: *Deleted

## 2015-12-28 DIAGNOSIS — R002 Palpitations: Secondary | ICD-10-CM | POA: Diagnosis not present

## 2015-12-29 NOTE — Progress Notes (Signed)
Carelink Summary Report / Loop Recorder 

## 2015-12-31 ENCOUNTER — Encounter: Payer: Self-pay | Admitting: Cardiology

## 2016-01-03 DIAGNOSIS — E039 Hypothyroidism, unspecified: Secondary | ICD-10-CM | POA: Diagnosis not present

## 2016-01-07 ENCOUNTER — Encounter: Payer: Self-pay | Admitting: Cardiology

## 2016-01-12 ENCOUNTER — Encounter: Payer: Self-pay | Admitting: Cardiology

## 2016-01-15 LAB — CUP PACEART REMOTE DEVICE CHECK: Date Time Interrogation Session: 20161218230819

## 2016-01-25 ENCOUNTER — Ambulatory Visit (INDEPENDENT_AMBULATORY_CARE_PROVIDER_SITE_OTHER): Payer: Medicare Other | Admitting: Allergy and Immunology

## 2016-01-25 ENCOUNTER — Encounter: Payer: Self-pay | Admitting: Allergy and Immunology

## 2016-01-25 VITALS — BP 132/74 | HR 88 | Resp 18

## 2016-01-25 DIAGNOSIS — G4489 Other headache syndrome: Secondary | ICD-10-CM | POA: Diagnosis not present

## 2016-01-25 DIAGNOSIS — J3089 Other allergic rhinitis: Secondary | ICD-10-CM | POA: Diagnosis not present

## 2016-01-25 DIAGNOSIS — K219 Gastro-esophageal reflux disease without esophagitis: Secondary | ICD-10-CM | POA: Diagnosis not present

## 2016-01-25 DIAGNOSIS — J4540 Moderate persistent asthma, uncomplicated: Secondary | ICD-10-CM

## 2016-01-25 MED ORDER — SUCRALFATE 1 G PO TABS: 1.0000 g | ORAL_TABLET | Freq: Four times a day (QID) | ORAL | Status: DC

## 2016-01-25 NOTE — Patient Instructions (Signed)
  1.  Continue treatment with Neurologist  2. Continue Asmanex 220 one inhalation one time per day  3. Continue ventolin HFA 2 puffs every 4-6 hours if needed.  4. Continue nasal saline and systane eye drops if needed.  5. Continue 'action plan' for asthma flare with Asmanex 220 two inhalations two times per day.  6. Can try carafate 1gm tablet four times a day with meals and at bedtime  7. Return in four months or earlier if problem

## 2016-01-25 NOTE — Progress Notes (Signed)
Follow-up Note  Referring Provider: Nolene Ebbs, MD Primary Provider: Philis Fendt, MD Date of Office Visit: 01/25/2016  Subjective:   Autumn Fitzgerald (DOB: May 07, 1946) is a 70 y.o. female who returns to the [facility] on 01/25/2016 in re-evaluation of the following:  HPI Comments: Autumn Fitzgerald returns to this clinic in reevaluation of her asthma, allergic rhinitis, headaches, and stomach problems. Her last visit in this clinic was 09/21/2015.  She is done very well regarding her asthma and has very little problems with shortness of breath or wheezing or coughing and does not need to use a short acting bronchodilator at this point in time. She has not required any systemic steroids to treat an exacerbation of her asthma. She is not very consistent about using Asmanex and as stated above rarely uses her albuterol. Her improvement really appeared to occur once she left her apartment and went to a new apartment as there may of been an issue with mold contamination in her old apartment.  Her nose is been doing quite well without any specific therapy. She is not had any sinus infections and has not required an antibiotic.  Her headaches are now under the treatment of Dr. Orie Rout and she is receiving steroid injections in her scalp. She is interested in Botox injections and she may seek out that form of therapy if the steroid injections do not help her regarding her headache.  Her stomach has been bothering her. In the past she has had an upper endoscopy performed by Dr. Collene Mares. She's been treated for reflux in the past without much improvement. She's been given peppermint oil and probiotics which have not really helped her. She complains about having pain after eating.      Current Outpatient Prescriptions on File Prior to Visit  Medication Sig Dispense Refill  . atenolol (TENORMIN) 25 MG tablet Take 0.5 tablets (12.5 mg total) by mouth daily as needed (for high blood  pressure). 45 tablet 3  . Cholecalciferol (VITAMIN D-3) 5000 UNITS TABS Take 5,000 Units by mouth daily.     . clonazePAM (KLONOPIN) 1 MG tablet Take 1-2 mg by mouth 2 (two) times daily. Take 0.5 tablet (0.5 mg) during the day and 2 tablets (2 mg) at bedtime    . ezetimibe-simvastatin (VYTORIN) 10-40 MG per tablet Take 0.5 tablets by mouth at bedtime.     Marland Kitchen levothyroxine (SYNTHROID) 50 MCG tablet Take 50 mcg by mouth daily.     . mometasone (ASMANEX) 220 MCG/INH inhaler Inhale 1 puff into the lungs daily as needed (for shortness of breath).     . Multiple Vitamin (MULTIVITAMIN) tablet Take 1 tablet by mouth daily.    . Probiotic Product (PROBIOTIC DAILY PO) Take 1 capsule by mouth daily.    Marland Kitchen Propylene Glycol (SYSTANE BALANCE OP) Apply 1 drop to eye 2 times daily at 12 noon and 4 pm.    . VENTOLIN HFA 108 (90 Base) MCG/ACT inhaler INHALE 2 PUFFS BY MOUTH EVERY 4 TO 6 HOURS AS NEEDED FOR COUGH OR WHEEZING 18 g 1  . aspirin 81 MG tablet Take 81 mg by mouth daily. Reported on 01/25/2016    . divalproex (DEPAKOTE ER) 250 MG 24 hr tablet Take 1 tablet (250 mg total) by mouth at bedtime. (Patient not taking: Reported on 01/25/2016) 30 tablet 6   No current facility-administered medications on file prior to visit.     Meds ordered this encounter  Medications  . sucralfate (  CARAFATE) 1 g tablet    Sig: Take 1 tablet (1 g total) by mouth 4 (four) times daily.    Dispense:  120 tablet    Refill:  5    Past Medical History  Diagnosis Date  . Panic attacks   . 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 GERD (gastroesophageal reflux disease)   . HLD (hyperlipidemia)   . Asthma   . Palpitations   . Near syncope   . Chest wall pain   . Hypertension     at times,  . Mental disorder   . Depression   . Head injury, closed, without LOC 02/2013    did not LOC per patient  . Arthritis   . Hyperthyroidism   . Headache     Past Surgical History  Procedure Laterality Date  . Coronary  artery bypass graft  02/05/2007  . Tubal ligation    . Cholecystectomy    . Loop recorder implant  03/18/2013  . Septal myectomy    . Nuclear stress test  11/30/2011    No ischemia  . Doppler echocardiography  03/14/2013    EF 65-70%,trivial AI,mild-mod. MR,Mod. TR,mod. concentric hypertrophy  . Knee arthroscopy Right 10/17/2013    Procedure: ARTHROSCOPY RIGHT KNEE;  Surgeon: [name], MD;  Location: [facility];  Service: Orthopedics;  Laterality: Right;  . Esophagogastroduodenoscopy N/A 11/24/2013    Procedure: ESOPHAGOGASTRODUODENOSCOPY (EGD);  Surgeon: Juanita Craver, MD;  Location: [facility];  Service: Endoscopy;  Laterality: N/A;  . Colonoscopy N/A 11/24/2013    Procedure: COLONOSCOPY;  Surgeon: Juanita Craver, MD;  Location: [facility];  Service: Endoscopy;  Laterality: N/A;  . Loop recorder implant N/A 03/18/2013    Procedure: LOOP RECORDER IMPLANT;  Surgeon: Sanda Klein, MD;  Location: [facility];  Service: Cardiovascular;  Laterality: N/A;    Allergies  Allergen Reactions  . Amoxicillin-Pot Clavulanate   . Statins Other (See Comments)    Myalgias with lipitor; tolerates vytorin    Review of systems negative except as noted in HPI / PMHx or noted below:  Review of Systems  Constitutional: Negative.   HENT: Negative.   Eyes: Negative.   Respiratory: Negative.   Cardiovascular: Negative.   Gastrointestinal: Negative.   Genitourinary: Negative.   Musculoskeletal: Negative.   Skin: Negative.   Neurological: Negative.   Endo/Heme/Allergies: Negative.   Psychiatric/Behavioral: Negative.      Objective:   Filed Vitals:   01/25/16 1440  BP: 132/74  Pulse: 88  Resp: 18          Physical Exam  Constitutional: She is well-developed, well-nourished, and in no distress.  HENT:  Head: Normocephalic.  Right Ear: Tympanic membrane, external ear and ear canal normal.  Left Ear: Tympanic membrane, external ear and ear canal normal.  Nose: Nose normal. No mucosal edema  or rhinorrhea.  Mouth/Throat: Uvula is midline, oropharynx is clear and moist and mucous membranes are normal. No oropharyngeal exudate.  Eyes: Conjunctivae are normal.  Neck: Trachea normal. No tracheal tenderness present. No tracheal deviation present. No thyromegaly present.  Cardiovascular: Normal rate, regular rhythm, S1 normal, S2 normal and normal heart sounds.   No murmur heard. Pulmonary/Chest: Breath sounds normal. No stridor. No respiratory distress. She has no wheezes. She has no rales.  Musculoskeletal: She exhibits no edema.  Lymphadenopathy:       Head (right side): No tonsillar adenopathy present.       Head (left side): No tonsillar adenopathy present.    She has no  cervical adenopathy.    She has no axillary adenopathy.  Neurological: She is alert. Gait normal.  Skin: No rash noted. She is not diaphoretic. No erythema. Nails show no clubbing.  Psychiatric: Mood and affect normal.    Diagnostics:    Spirometry was performed and demonstrated an FEV1 of 1.34 at 71 % of predicted.  The patient had an Asthma Control Test with the following results:  .    Assessment and Plan:   1. Moderate persistent asthma, uncomplicated   2. Other allergic rhinitis   3. Gastroesophageal reflux disease, esophagitis presence not specified   4. Other headache syndrome     1.  Continue treatment with Neurologist  2. Continue Asmanex 220 one inhalation one time per day  3. Continue ventolin HFA 2 puffs every 4-6 hours if needed.  4. Continue nasal saline and systane eye drops if needed.  5. Continue 'action plan' for asthma flare with Asmanex 220 two inhalations two times per day.  6. Can try carafate 1gm tablet four times a day with meals and at bedtime  7. Return in four months or earlier if problem   Overall, Hagar is doing quite well on her current plan and we'll keep her on Asmanex and Ventolin and nasal saline and eye drops if needed. I did give her prescription for  Carafate that she can try to see if this helps her postprandial abdominal pain. She should know within a week or so whether or not this is going to help regarding this issue. She will follow-up with her neurologist concerning treatment of her headaches. I will see her back in this clinic in 4 months or earlier if there is a problem. Allena Katz, MD [place]

## 2016-01-26 ENCOUNTER — Telehealth: Payer: Self-pay | Admitting: Cardiovascular Disease

## 2016-01-26 NOTE — Telephone Encounter (Signed)
New message  Device  Pt called states that she is not aware on how to send the signal. She states that it doesn't work. She states that the batteries are dead. Please call back to discuss.

## 2016-01-26 NOTE — Telephone Encounter (Signed)
Spoke with patient, advised that her MDT ILR battery is still working and that the low battery alert was false and due to her device needing a software update.  Made her aware that she does not need an appointment in office because her device software has since been updated via a manual transmission from home.  Patient verbalizes understanding of instructions and denies questions or concerns at this time.

## 2016-01-27 ENCOUNTER — Ambulatory Visit (INDEPENDENT_AMBULATORY_CARE_PROVIDER_SITE_OTHER): Payer: Medicare Other | Admitting: *Deleted

## 2016-01-27 DIAGNOSIS — R002 Palpitations: Secondary | ICD-10-CM

## 2016-01-28 ENCOUNTER — Emergency Department (HOSPITAL_COMMUNITY): Payer: Medicare Other

## 2016-01-28 ENCOUNTER — Encounter (HOSPITAL_COMMUNITY): Payer: Self-pay | Admitting: Family Medicine

## 2016-01-28 ENCOUNTER — Emergency Department (HOSPITAL_COMMUNITY)
Admission: EM | Admit: 2016-01-28 | Discharge: 2016-01-28 | Disposition: A | Discharge status: Home / Self Care | Payer: Medicare Other | Attending: Emergency Medicine | Admitting: Emergency Medicine

## 2016-01-28 DIAGNOSIS — Z8679 Personal history of other diseases of the circulatory system: Secondary | ICD-10-CM | POA: Diagnosis not present

## 2016-01-28 DIAGNOSIS — J45909 Unspecified asthma, uncomplicated: Secondary | ICD-10-CM | POA: Insufficient documentation

## 2016-01-28 DIAGNOSIS — I10 Essential (primary) hypertension: Secondary | ICD-10-CM | POA: Insufficient documentation

## 2016-01-28 DIAGNOSIS — Z951 Presence of aortocoronary bypass graft: Secondary | ICD-10-CM | POA: Insufficient documentation

## 2016-01-28 DIAGNOSIS — J40 Bronchitis, not specified as acute or chronic: Secondary | ICD-10-CM | POA: Diagnosis not present

## 2016-01-28 DIAGNOSIS — E0590 Thyrotoxicosis, unspecified without thyrotoxic crisis or storm: Secondary | ICD-10-CM | POA: Insufficient documentation

## 2016-01-28 DIAGNOSIS — F410 Panic disorder [episodic paroxysmal anxiety] without agoraphobia: Secondary | ICD-10-CM | POA: Insufficient documentation

## 2016-01-28 DIAGNOSIS — E785 Hyperlipidemia, unspecified: Secondary | ICD-10-CM | POA: Diagnosis not present

## 2016-01-28 DIAGNOSIS — Z79899 Other long term (current) drug therapy: Secondary | ICD-10-CM | POA: Insufficient documentation

## 2016-01-28 DIAGNOSIS — M546 Pain in thoracic spine: Secondary | ICD-10-CM | POA: Insufficient documentation

## 2016-01-28 DIAGNOSIS — Z880 Allergy status to penicillin: Secondary | ICD-10-CM | POA: Diagnosis not present

## 2016-01-28 DIAGNOSIS — K219 Gastro-esophageal reflux disease without esophagitis: Secondary | ICD-10-CM | POA: Diagnosis not present

## 2016-01-28 DIAGNOSIS — Z87828 Personal history of other (healed) physical injury and trauma: Secondary | ICD-10-CM | POA: Insufficient documentation

## 2016-01-28 DIAGNOSIS — R079 Chest pain, unspecified: Secondary | ICD-10-CM | POA: Diagnosis not present

## 2016-01-28 DIAGNOSIS — R0789 Other chest pain: Secondary | ICD-10-CM | POA: Insufficient documentation

## 2016-01-28 LAB — CBC
HCT: 40.8 % (ref 36.0–46.0)
Hemoglobin: 13.1 g/dL (ref 12.0–15.0)
MCH: 28.1 pg (ref 26.0–34.0)
MCHC: 32.1 g/dL (ref 30.0–36.0)
MCV: 87.6 fL (ref 78.0–100.0)
Platelets: 247 10*3/uL (ref 150–400)
RBC: 4.66 MIL/uL (ref 3.87–5.11)
RDW: 13.5 % (ref 11.5–15.5)
WBC: 5.4 10*3/uL (ref 4.0–10.5)

## 2016-01-28 LAB — BASIC METABOLIC PANEL
Anion gap: 10 (ref 5–15)
BUN: 10 mg/dL (ref 6–20)
CO2: 25 mmol/L (ref 22–32)
Calcium: 9 mg/dL (ref 8.9–10.3)
Chloride: 104 mmol/L (ref 101–111)
Creatinine, Ser: 0.77 mg/dL (ref 0.44–1.00)
GFR calc Af Amer: >60 mL/min (ref >60)
GFR calc non Af Amer: >60 mL/min (ref >60)
Glucose, Bld: 124 mg/dL — ABNORMAL HIGH (ref 65–99)
Potassium: 3.8 mmol/L (ref 3.5–5.1)
Sodium: 139 mmol/L (ref 135–145)

## 2016-01-28 LAB — I-STAT TROPONIN, ED: Troponin i, poc: 0 ng/mL (ref 0.00–0.08)

## 2016-01-28 MED ORDER — KETOROLAC TROMETHAMINE 15 MG/ML IJ SOLN
15.0000 mg | Freq: Once | INTRAMUSCULAR | Status: AC
  Administered 2016-01-28: 15 mg via INTRAMUSCULAR
  Filled 2016-01-28: qty 1

## 2016-01-28 MED ORDER — TRAMADOL HCL 50 MG PO TABS: 50.0000 mg | ORAL_TABLET | Freq: Four times a day (QID) | ORAL | Status: DC | PRN

## 2016-01-28 NOTE — Progress Notes (Signed)
Carelink Summary Report / Loop Recorder 

## 2016-01-28 NOTE — ED Notes (Signed)
Pt here for right side pain and chest pain that started yesterday. sts she tripped in January and hurting since. sts last night central chest pain and palpatations.

## 2016-01-28 NOTE — ED Provider Notes (Signed)
CSN: [id number]     Arrival date & time 01/28/16  1159 History   First MD Initiated Contact with Patient 01/28/16 1652     Chief Complaint  Patient presents with  . Chest Pain  . Abdominal Pain     (Consider location/radiation/quality/duration/timing/severity/associated sxs/prior Treatment) HPI   70 year old female chest pain. Right sided. Patient reports approximately one month ago she tripped over a curb and stepped awkwardly. Since that time she's been having soreness in her right lateral to right mid back. Yesterday she was carrying some heavy bags when she noticed increased soreness in the area which has since worsened. Pain is constant. Worse with movement. Denies any other associated symptoms such as dyspnea, nausea, cough, dizziness or lightheadedness. No unusual leg pain or swelling. No known history CAD. She does have a past history of hypertrophic obstructive cardiomyopathy previously managed in [place]. Denies significant ongoing issues since then.  Past Medical History  Diagnosis Date  . Panic attacks   . 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 GERD (gastroesophageal reflux disease)   . HLD (hyperlipidemia)   . Asthma   . Palpitations   . Near syncope   . Chest wall pain   . Hypertension     at times,  . Mental disorder   . Depression   . Head injury, closed, without LOC 02/2013    did not LOC per patient  . Arthritis   . Hyperthyroidism   . Headache    Past Surgical History  Procedure Laterality Date  . 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 Tubal ligation    . Cholecystectomy    . Loop recorder implant  03/18/2013  . Septal myectomy    . Nuclear stress test  11/30/2011    No ischemia  . Doppler echocardiography  03/14/2013    EF 65-70%,trivial AI,mild-mod. MR,Mod. TR,mod. concentric hypertrophy  . Knee arthroscopy Right 10/17/2013    Procedure: ARTHROSCOPY RIGHT KNEE;  Surgeon: [name], MD;  Location: [facility];  Service: Orthopedics;   Laterality: Right;  . Esophagogastroduodenoscopy N/A 11/24/2013    Procedure: ESOPHAGOGASTRODUODENOSCOPY (EGD);  Surgeon: Juanita Craver, MD;  Location: [facility];  Service: Endoscopy;  Laterality: N/A;  . Colonoscopy N/A 11/24/2013    Procedure: COLONOSCOPY;  Surgeon: Juanita Craver, MD;  Location: [facility];  Service: Endoscopy;  Laterality: N/A;  . Loop recorder implant N/A 03/18/2013    Procedure: LOOP RECORDER IMPLANT;  Surgeon: Sanda Klein, MD;  Location: [facility];  Service: Cardiovascular;  Laterality: N/A;   Family History  Problem Relation Age of Onset  . Cancer Mother     breast  . Hypertension Mother   . Cancer Father     prostate  . Diabetes Sister   . Heart attack Sister   . Migraines Neg Hx    Social History  Substance Use Topics  . Smoking status: Never Smoker   . Smokeless tobacco: Never Used  . Alcohol Use: No   OB History    Gravida Para Term Preterm AB TAB SAB Ectopic Multiple Living   5 5 5       4      Review of Systems  All systems reviewed and negative, other than as noted in HPI.   Allergies  Amoxicillin-pot clavulanate; Statins; and Temazepam  Home Medications   Prior to Admission medications   Medication Sig Start Date End Date Taking? Authorizing Provider  atenolol (TENORMIN) 25 MG tablet Take 0.5 tablets (12.5 mg total) by mouth daily  as needed (for high blood pressure). 12/21/14  Yes Mihai Croitoru, MD  Cholecalciferol (VITAMIN D-3) 5000 UNITS TABS Take 5,000 Units by mouth daily.    Yes Historical Provider, MD  clonazePAM (KLONOPIN) 1 MG tablet Take 1 mg by mouth at bedtime.    Yes Historical Provider, MD  ezetimibe-simvastatin (VYTORIN) 10-40 MG per tablet Take 0.5 tablets by mouth at bedtime.    Yes Historical Provider, MD  levothyroxine (SYNTHROID) 50 MCG tablet Take 50 mcg by mouth daily.  06/15/14  Yes Historical Provider, MD  Multiple Vitamin (MULTIVITAMIN) tablet Take 1 tablet by mouth daily.   Yes Historical Provider, MD  Probiotic  Product (PROBIOTIC DAILY PO) Take 1 capsule by mouth daily.   Yes Historical Provider, MD  Propylene Glycol (SYSTANE BALANCE OP) Apply 1 drop to eye 2 times daily at 12 noon and 4 pm.   Yes Historical Provider, MD  VENTOLIN HFA 108 (90 Base) MCG/ACT inhaler INHALE 2 PUFFS BY MOUTH EVERY 4 TO 6 HOURS AS NEEDED FOR COUGH OR WHEEZING 12/16/15  Yes Jiles Prows, MD  divalproex (DEPAKOTE ER) 250 MG 24 hr tablet Take 1 tablet (250 mg total) by mouth at bedtime. Patient not taking: Reported on 01/25/2016 11/11/15   Melvenia Beam, MD  sucralfate (CARAFATE) 1 g tablet Take 1 tablet (1 g total) by mouth 4 (four) times daily. 01/25/16   Jiles Prows, MD   BP 114/73 mmHg  Pulse 83  Temp(Src) 98.2 F (36.8 C) (Oral)  Resp 17  SpO2 100% Physical Exam  Constitutional: She appears well-developed and well-nourished. No distress.  HENT:  Head: Normocephalic and atraumatic.  Eyes: Conjunctivae are normal. Right eye exhibits no discharge. Left eye exhibits no discharge.  Neck: Neck supple.  Cardiovascular: Normal rate, regular rhythm and normal heart sounds.  Exam reveals no gallop and no friction rub.   No murmur heard. Pulmonary/Chest: Effort normal and breath sounds normal. No respiratory distress.  Abdominal: Soft. She exhibits no distension. There is no tenderness.  Musculoskeletal: She exhibits tenderness. She exhibits no edema.  Tenderness along the right lateral chest wall and right lateral thoracic back. No concerning skin changes. No crepitus. Breath sounds were symmetric bilaterally with good air movement. No midline spinal tenderness.  Neurological: She is alert.  Skin: Skin is warm and dry.  Psychiatric: She has a normal mood and affect. Her behavior is normal. Thought content normal.  Nursing note and vitals reviewed.   ED Course  Procedures (including critical care time) Labs Review Labs Reviewed  BASIC METABOLIC PANEL - Abnormal; Notable for the following:    Glucose, Bld 124 (*)     All other components within normal limits  CBC  I-STAT TROPOININ, ED    Imaging Review Dg Chest 2 View  01/28/2016  CLINICAL DATA:  70 year old female with progressive right-sided flank and chest pain EXAM: CHEST  2 VIEW COMPARISON:  Prior chest x-ray 04/11/2015 FINDINGS: Cardiac and mediastinal contours remain within normal limits. Patient is status post median sternotomy. Implantable loop recorder projects over the left chest. Surgical clips in the right upper quadrant suggest prior cholecystectomy. Mild bronchitic change and basilar interstitial prominence without interval change. No focal airspace consolidation, pulmonary edema, pleural effusion or pneumothorax. No acute osseous abnormality. IMPRESSION: Stable chest x-ray without evidence of acute cardiopulmonary process. Electronically Signed   By: Jacqulynn Cadet M.D.   On: 01/28/2016 13:08   I have personally reviewed and evaluated these images and lab results as part of my  medical decision-making.   EKG Interpretation   Date/Time:  Friday January 28 2016 12:41:05 EST Ventricular Rate:  90 PR Interval:  130 QRS Duration: 116 QT Interval:  370 QTC Calculation: 452 R Axis:   104 Text Interpretation:  Normal sinus rhythm LBBB Confirmed by Oluwatosin Higginson  MD,  Tannis Burstein ([id number]) on 01/28/2016 5:21:09 PM      MDM   Final diagnoses:  Right-sided chest wall pain    70 year old female with right-sided chest pain. Symptoms consistent with right-sided chest wall pain. Doubt ACS , PE, dissection or other emergent pathology. Plan symptomatic treatment. Return precautions were discussed. Outpatient follow-up otherwise.    Virgel Manifold, MD 02/03/16 [phone]

## 2016-01-28 NOTE — Discharge Instructions (Signed)

## 2016-02-14 LAB — CUP PACEART REMOTE DEVICE CHECK: Date Time Interrogation Session: 20170117231031

## 2016-02-14 NOTE — Progress Notes (Signed)
Carelink summary report received. Battery status OK. Normal device function. No new symptom episodes, tachy episodes, brady, or pause episodes. No new AF episodes. Monthly summary reports and ROV/PRN 

## 2016-02-15 ENCOUNTER — Other Ambulatory Visit: Payer: Self-pay | Admitting: Allergy and Immunology

## 2016-02-16 LAB — CUP PACEART REMOTE DEVICE CHECK: Date Time Interrogation Session: 20170216234013

## 2016-02-16 NOTE — Progress Notes (Signed)
Carelink summary report received. Battery status OK. Normal device function. No new symptom episodes, tachy episodes, brady, or pause episodes. No new AF episodes. Monthly summary reports and ROV/PRN 

## 2016-02-28 ENCOUNTER — Ambulatory Visit (INDEPENDENT_AMBULATORY_CARE_PROVIDER_SITE_OTHER): Payer: Medicare Other | Admitting: *Deleted

## 2016-02-28 DIAGNOSIS — R002 Palpitations: Secondary | ICD-10-CM | POA: Diagnosis not present

## 2016-02-29 NOTE — Progress Notes (Signed)
Carelink Summary Report / Loop Recorder 

## 2016-03-20 ENCOUNTER — Other Ambulatory Visit: Payer: Self-pay | Admitting: Allergy and Immunology

## 2016-03-27 ENCOUNTER — Ambulatory Visit (INDEPENDENT_AMBULATORY_CARE_PROVIDER_SITE_OTHER): Payer: Medicare Other | Admitting: *Deleted

## 2016-03-27 DIAGNOSIS — R002 Palpitations: Secondary | ICD-10-CM | POA: Diagnosis not present

## 2016-03-28 NOTE — Progress Notes (Signed)
Carelink Summary Report / Loop Recorder 

## 2016-04-14 ENCOUNTER — Telehealth: Payer: Self-pay | Admitting: Cardiology

## 2016-04-14 NOTE — Telephone Encounter (Signed)
LMOVM requesting that pt send manual transmission b/c home monitor has not updated in at least 14 days.    

## 2016-04-19 ENCOUNTER — Telehealth: Payer: Self-pay | Admitting: Cardiovascular Disease

## 2016-04-19 NOTE — Telephone Encounter (Signed)
F/u  Pt returning phone call from last week about home monitor. Please call back and discuss.

## 2016-04-19 NOTE — Telephone Encounter (Signed)
Pt stated that she unplugged her home monitor b/c her battery is low on her loop recorder. According to Apr 15, 2016 even report in carelink this is accurate information and the pt loop recorder reached RRT on 03-26-2016.

## 2016-04-24 ENCOUNTER — Encounter: Payer: Self-pay | Admitting: Obstetrics and Gynecology

## 2016-04-24 ENCOUNTER — Ambulatory Visit (INDEPENDENT_AMBULATORY_CARE_PROVIDER_SITE_OTHER): Payer: Medicare Other | Admitting: Obstetrics and Gynecology

## 2016-04-24 VITALS — BP 144/76 | HR 82 | Ht 60.0 in | Wt 138.0 lb

## 2016-04-24 DIAGNOSIS — L900 Lichen sclerosus et atrophicus: Secondary | ICD-10-CM | POA: Diagnosis not present

## 2016-04-24 DIAGNOSIS — N760 Acute vaginitis: Secondary | ICD-10-CM | POA: Diagnosis not present

## 2016-04-24 MED ORDER — CLOBETASOL PROPIONATE 0.05 % EX CREA: 1.0000 "application " | TOPICAL_CREAM | CUTANEOUS | Status: DC

## 2016-04-24 NOTE — Progress Notes (Signed)
Patient ID: Autumn Fitzgerald, female   DOB: Sep 15, 1946, 70 y.o.   MRN: [id number] 70 yo presenting today for 6 months follow up on her lichen sclerosis. Patient reports doing well with the cream although she doesn't use it twice weekly. She denies any pruritis or vaginal discomfort. She is not sexually active. She also reports the presence of a foul vaginal odor for the past 3 months. She denies abnormal discharge. She also denies any vaginal bleeding. She does report some heavy rectal bleeding following a bowel movement. It only occurs following a bowel movement. She denies any constipation. She does have hemorrhoids.  Past Medical History  Diagnosis Date  . Panic attacks   . 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 GERD (gastroesophageal reflux disease)   . HLD (hyperlipidemia)   . Asthma   . Palpitations   . Near syncope   . Chest wall pain   . Hypertension     at times,  . Mental disorder   . Depression   . Head injury, closed, without LOC 02/2013    did not LOC per patient  . Arthritis   . Hyperthyroidism   . Headache    Past Surgical History  Procedure Laterality Date  . 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 Tubal ligation    . Cholecystectomy    . Loop recorder implant  03/18/2013  . Septal myectomy    . Nuclear stress test  11/30/2011    No ischemia  . Doppler echocardiography  03/14/2013    EF 65-70%,trivial AI,mild-mod. MR,Mod. TR,mod. concentric hypertrophy  . Knee arthroscopy Right 10/17/2013    Procedure: ARTHROSCOPY RIGHT KNEE;  Surgeon: [name], MD;  Location: [facility];  Service: Orthopedics;  Laterality: Right;  . Esophagogastroduodenoscopy N/A 11/24/2013    Procedure: ESOPHAGOGASTRODUODENOSCOPY (EGD);  Surgeon: Juanita Craver, MD;  [facility];  Service: Endoscopy;  Laterality: N/A;  . Colonoscopy N/A 11/24/2013    Procedure: COLONOSCOPY;  Surgeon: Juanita Craver, MD;  Location[facility];  Service: Endoscopy;  Laterality: N/A;  . Loop  recorder implant N/A 03/18/2013    Procedure: LOOP RECORDER IMPLANT;  Surgeon: Sanda Klein, MD;  Location: [facility];  Service: Cardiovascular;  Laterality: N/A;   Family History  Problem Relation Age of Onset  . Cancer Mother     breast  . Hypertension Mother   . Cancer Father     prostate  . Diabetes Sister   . Heart attack Sister   . Migraines Neg Hx    Social History  Substance Use Topics  . Smoking status: Never Smoker   . Smokeless tobacco: Never Used  . Alcohol Use: No   ROS See pertinent in HPI  Blood pressure 144/76, pulse 82, height 5' (1.524 m), weight 138 lb (62.596 kg). GENERAL: Well-developed, well-nourished female in no acute distress.   ABDOMEN: Soft, nontender, nondistended. No organomegaly. PELVIC: Normal external female genitalia. Vagina is atrophic with two white areas: one located inferior to the urethra approximately 1 cm wide and the other on the inferior aspect of the introitus also approximately 1 cm wide..  Normal discharge. Normal atrophic appearing cervix, flush with vaginal wall. No evidence of vaginal bleeding. Uterus is normal in size. No adnexal mass or tenderness. EXTREMITIES: No cyanosis, clubbing, or edema, 2+ distal pulses.  [name] 70 yo with lichen sclerosis - Rx clobetasol cream provided - wet prep collected - patient advised to follow up with her GI for further evaluation of rectal bleeding. In the  meantime, she should continue over the counter treatment for rectal hemorrhoids - Patient will be contacted with any abnormal results - RTC in 6 months or prn

## 2016-04-25 LAB — WET PREP, GENITAL
Clue Cells Wet Prep HPF POC: NONE SEEN
Trich, Wet Prep: NONE SEEN
WBC, Wet Prep HPF POC: NONE SEEN
Yeast Wet Prep HPF POC: NONE SEEN

## 2016-04-26 ENCOUNTER — Ambulatory Visit (INDEPENDENT_AMBULATORY_CARE_PROVIDER_SITE_OTHER): Payer: Medicare Other | Admitting: *Deleted

## 2016-04-26 DIAGNOSIS — R002 Palpitations: Secondary | ICD-10-CM

## 2016-04-28 NOTE — Progress Notes (Signed)
Carelink Summary Report / Loop Recorder 

## 2016-05-02 ENCOUNTER — Telehealth: Payer: Self-pay | Admitting: *Deleted

## 2016-05-02 NOTE — Telephone Encounter (Signed)
Calling for results.

## 2016-05-02 NOTE — Telephone Encounter (Addendum)
Per  Message from Dr.  Elly Modena please inform the patient of no evidence of vaginal infection based on wet prep. Foul odor may be coming from rectum. She should follow up with GI as discussed  Called Vali and left message we are calling with some information- please call clinic. If you are comfortable may leave information on your voicemail with your permission

## 2016-05-03 NOTE — Telephone Encounter (Signed)
Called patient and informed her of results & recommendations. Patient verbalized understanding & had no questions  

## 2016-05-03 NOTE — Telephone Encounter (Signed)
Patient called and left message stating she is calling for her results.

## 2016-05-05 LAB — CUP PACEART REMOTE DEVICE CHECK: Date Time Interrogation Session: 20170318234039

## 2016-05-08 LAB — CUP PACEART REMOTE DEVICE CHECK: Date Time Interrogation Session: 20170418000826

## 2016-05-08 NOTE — Progress Notes (Signed)
Carelink summary report received. Battery status OK. Normal device function. No new symptom episodes, tachy episodes, brady, or pause episodes. No new AF episodes. Monthly summary reports and ROV/PRN 

## 2016-05-11 DIAGNOSIS — K5900 Constipation, unspecified: Secondary | ICD-10-CM | POA: Diagnosis not present

## 2016-05-15 ENCOUNTER — Other Ambulatory Visit: Payer: Self-pay | Admitting: Allergy and Immunology

## 2016-05-22 ENCOUNTER — Encounter (HOSPITAL_COMMUNITY): Payer: Self-pay | Admitting: *Deleted

## 2016-05-22 ENCOUNTER — Ambulatory Visit (HOSPITAL_COMMUNITY)
Admission: EM | Admit: 2016-05-22 | Discharge: 2016-05-22 | Disposition: A | Discharge status: Home / Self Care | Payer: Medicare Other | Attending: Family Medicine | Admitting: Family Medicine

## 2016-05-22 DIAGNOSIS — J340 Abscess, furuncle and carbuncle of nose: Secondary | ICD-10-CM | POA: Diagnosis not present

## 2016-05-22 MED ORDER — CLINDAMYCIN HCL 300 MG PO CAPS: 300.0000 mg | ORAL_CAPSULE | Freq: Three times a day (TID) | ORAL | Status: DC

## 2016-05-22 MED ORDER — TRAMADOL-ACETAMINOPHEN 37.5-325 MG PO TABS: 1.0000 | ORAL_TABLET | Freq: Four times a day (QID) | ORAL | Status: DC | PRN

## 2016-05-22 NOTE — ED Notes (Signed)
Pt  Reports   sev  Days  Ago   Pt  Developed  A  Sensation of  Something     gpoing  Into her nose      she  Reports swelling  And  Redness     To  Her nose   With  Pain  Radiating  To  r  Side  Face/   r  Eye

## 2016-05-22 NOTE — ED Provider Notes (Signed)
CSN: [id number]     Arrival date & time 05/22/16  1502 History   First MD Initiated Contact with Patient 05/22/16 1724     Chief Complaint  Patient presents with  . Nose Problem   (Consider location/radiation/quality/duration/timing/severity/associated sxs/prior Treatment) HPI Comments: 70 year old female states that 3 days ago she felt a bug fly upper right nostril. The following morning she experienced pain at the tip of her nose associated with minor erythema. She is now complaining of pain to the tip of her nose and just inside of the right nostril. Nostril feels somewhat stuffy and hard to breathe through. Pain in the nose radiates superiorly along the right paranasal soft tissue towards the eye. Denies pain in the eye.   Past Medical History  Diagnosis Date  . Panic attacks   . 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 GERD (gastroesophageal reflux disease)   . HLD (hyperlipidemia)   . Asthma   . Palpitations   . Near syncope   . Chest wall pain   . Hypertension     at times,  . Mental disorder   . Depression   . Head injury, closed, without LOC 02/2013    did not LOC per patient  . Arthritis   . Hyperthyroidism   . Headache    Past Surgical History  Procedure Laterality Date  . 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 Tubal ligation    . Cholecystectomy    . Loop recorder implant  03/18/2013  . Septal myectomy    . Nuclear stress test  11/30/2011    No ischemia  . Doppler echocardiography  03/14/2013    EF 65-70%,trivial AI,mild-mod. MR,Mod. TR,mod. concentric hypertrophy  . Knee arthroscopy Right 10/17/2013    Procedure: ARTHROSCOPY RIGHT KNEE;  Surgeon: [name], MD;  Location: [facility];  Service: Orthopedics;  Laterality: Right;  . Esophagogastroduodenoscopy N/A 11/24/2013    Procedure: ESOPHAGOGASTRODUODENOSCOPY (EGD);  Surgeon: Juanita Craver, MD;  Location: [facility];  Service: Endoscopy;  Laterality: N/A;  . Colonoscopy N/A 11/24/2013   Procedure: COLONOSCOPY;  Surgeon: Juanita Craver, MD;  Location: [facility];  Service: Endoscopy;  Laterality: N/A;  . Loop recorder implant N/A 03/18/2013    Procedure: LOOP RECORDER IMPLANT;  Surgeon: Sanda Klein, MD;  Location: [facility];  Service: Cardiovascular;  Laterality: N/A;   Family History  Problem Relation Age of Onset  . Cancer Mother     breast  . Hypertension Mother   . Cancer Father     prostate  . Diabetes Sister   . Heart attack Sister   . Migraines Neg Hx    Social History  Substance Use Topics  . Smoking status: Never Smoker   . Smokeless tobacco: Never Used  . Alcohol Use: No   OB History    Gravida Para Term Preterm AB TAB SAB Ectopic Multiple Living   5 5 5       4      Review of Systems  Constitutional: Negative.  Negative for activity change.  HENT: Positive for congestion. Negative for dental problem, drooling, nosebleeds, postnasal drip, rhinorrhea and sneezing.   Eyes: Negative.   Respiratory: Negative.   All other systems reviewed and are negative.   Allergies  Amoxicillin-pot clavulanate; Statins; and Temazepam  Home Medications   Prior to Admission medications   Medication Sig Start Date End Date Taking? Authorizing Provider  atenolol (TENORMIN) 25 MG tablet Take 0.5 tablets (12.5 mg total) by mouth daily as needed (for high  blood pressure). 12/21/14   Mihai Croitoru, MD  Cholecalciferol (VITAMIN D-3) 5000 UNITS TABS Take 5,000 Units by mouth daily.     Historical Provider, MD  clindamycin (CLEOCIN) 300 MG capsule Take 1 capsule (300 mg total) by mouth 3 (three) times daily. 05/22/16   Janne Napoleon, NP  clobetasol cream (TEMOVATE) [id number] % Apply 1 application topically 2 (two) times a week. 04/24/16   Peggy Constant, MD  clonazePAM (KLONOPIN) 1 MG tablet Take 1 mg by mouth at bedtime.     Historical Provider, MD  ezetimibe-simvastatin (VYTORIN) 10-40 MG per tablet Take 0.5 tablets by mouth at bedtime.     Historical Provider, MD  levothyroxine  (SYNTHROID) 50 MCG tablet Take 50 mcg by mouth daily.  06/15/14   Historical Provider, MD  Multiple Vitamin (MULTIVITAMIN) tablet Take 1 tablet by mouth daily.    Historical Provider, MD  pantoprazole (PROTONIX) 20 MG tablet Take 20 mg by mouth daily.    Historical Provider, MD  Probiotic Product (PROBIOTIC DAILY PO) Take 1 capsule by mouth daily.    Historical Provider, MD  Propylene Glycol (SYSTANE BALANCE OP) Apply 1 drop to eye 2 times daily at 12 noon and 4 pm.    Historical Provider, MD  traMADol-acetaminophen (ULTRACET) 37.5-325 MG tablet Take 1 tablet by mouth every 6 (six) hours as needed for moderate pain. 05/22/16   Janne Napoleon, NP  VENTOLIN HFA 108 (90 Base) MCG/ACT inhaler INHALE 2 PUFFS BY MOUTH EVERY 4 TO 6 HOURS AS NEEDED FOR COUGH OR WHEEZING 05/15/16   Jiles Prows, MD   Meds Ordered and Administered this Visit  Medications - No data to display  BP 146/73 mmHg  Pulse 90  Temp(Src) 98.5 F (36.9 C) (Oral)  Resp 20  SpO2 99% No data found.   Physical Exam  Constitutional: She appears well-developed and well-nourished. No distress.  HENT:  Head: Normocephalic and atraumatic.  Mouth/Throat: Oropharynx is clear and moist.  There is minor erythema and minimal swelling to the tip of the nose more so to the right of midline. Inspection to the right nostril reveals no foreign bodies, swelling, erythema, sites for bleeding or other abnormalities.  Eyes: Conjunctivae and EOM are normal. Pupils are equal, round, and reactive to light.  Neck: Normal range of motion. Neck supple.  Cardiovascular: Normal rate.   Neurological: She is alert. She exhibits normal muscle tone.  Skin: Skin is warm and dry.  Psychiatric: She has a normal mood and affect.  Nursing note and vitals reviewed.   ED Course  Procedures (including critical care time)  Labs Review Labs Reviewed - No data to display  Imaging Review No results found.   Visual Acuity Review  Right Eye Distance:   Left Eye  Distance:   Bilateral Distance:    Right Eye Near:   Left Eye Near:    Bilateral Near:         MDM   1. Cellulitis of nose, external    Continue cold compresses for pain since this helps the best. For worsening, pus draining from nostril, fever, spreading redness or other problems seek medical attention promptly Meds ordered this encounter  Medications  . clindamycin (CLEOCIN) 300 MG capsule    Sig: Take 1 capsule (300 mg total) by mouth 3 (three) times daily.    Dispense:  24 capsule    Refill:  0    Order Specific Question:  Supervising Provider    Answer:  Billy Fischer [phone]  .  traMADol-acetaminophen (ULTRACET) 37.5-325 MG tablet    Sig: Take 1 tablet by mouth every 6 (six) hours as needed for moderate pain.    Dispense:  15 tablet    Refill:  0    Order Specific Question:  Supervising Provider    Answer:  Billy Fischer [[id number]]       Janne Napoleon, NP 05/22/16 1801

## 2016-05-22 NOTE — Discharge Instructions (Signed)
Cellulitis Continue cold compresses for pain since this helps the best. For worsening, pus draining from nostril, fever, spreading redness or other problems seek medical attention promptly Cellulitis is an infection of the skin and the tissue under the skin. The infected area is usually red and tender. This happens most often in the arms and lower legs. HOME CARE   Take your antibiotic medicine as told. Finish the medicine even if you start to feel better.  Keep the infected arm or leg raised (elevated).  Put a warm cloth on the area up to 4 times per day.  Only take medicines as told by your doctor.  Keep all doctor visits as told. GET HELP IF:  You see red streaks on the skin coming from the infected area.  Your red area gets bigger or turns a dark color.  Your bone or joint under the infected area is painful after the skin heals.  Your infection comes back in the same area or different area.  You have a puffy (swollen) bump in the infected area.  You have new symptoms.  You have a fever. GET HELP RIGHT AWAY IF:   You feel very sleepy.  You throw up (vomit) or have watery poop (diarrhea).  You feel sick and have muscle aches and pains.   This information is not intended to replace advice given to you by your health care provider. Make sure you discuss any questions you have with your health care provider.   Document Released: 05/15/2008 Document Revised: 08/18/2015 Document Reviewed: 02/12/2012 Elsevier Interactive Patient Education [employer].

## 2016-05-26 ENCOUNTER — Encounter: Payer: Medicare Other | Admitting: *Deleted

## 2016-06-01 ENCOUNTER — Ambulatory Visit: Payer: Self-pay | Admitting: Obstetrics and Gynecology

## 2016-06-05 LAB — CUP PACEART REMOTE DEVICE CHECK: Date Time Interrogation Session: 20170518003750

## 2016-06-12 ENCOUNTER — Other Ambulatory Visit: Payer: Self-pay | Admitting: Internal Medicine

## 2016-06-12 DIAGNOSIS — Z1231 Encounter for screening mammogram for malignant neoplasm of breast: Secondary | ICD-10-CM

## 2016-06-29 ENCOUNTER — Ambulatory Visit
Admission: RE | Admit: 2016-06-29 | Discharge: 2016-06-29 | Disposition: A | Discharge status: Home / Self Care | Payer: Medicare Other | Source: Ambulatory Visit | Attending: Internal Medicine | Admitting: Internal Medicine

## 2016-06-29 DIAGNOSIS — Z1231 Encounter for screening mammogram for malignant neoplasm of breast: Secondary | ICD-10-CM

## 2016-07-14 ENCOUNTER — Other Ambulatory Visit: Payer: Self-pay | Admitting: Allergy and Immunology

## 2016-07-25 ENCOUNTER — Ambulatory Visit: Payer: Medicare Other | Admitting: Allergy and Immunology

## 2016-08-02 ENCOUNTER — Ambulatory Visit (INDEPENDENT_AMBULATORY_CARE_PROVIDER_SITE_OTHER): Payer: Medicare Other | Admitting: Cardiovascular Disease

## 2016-08-02 ENCOUNTER — Encounter: Payer: Self-pay | Admitting: Cardiovascular Disease

## 2016-08-02 VITALS — BP 116/68 | HR 81 | Ht 60.0 in | Wt 137.0 lb

## 2016-08-02 DIAGNOSIS — I422 Other hypertrophic cardiomyopathy: Secondary | ICD-10-CM | POA: Diagnosis not present

## 2016-08-02 DIAGNOSIS — Z4509 Encounter for adjustment and management of other cardiac device: Secondary | ICD-10-CM

## 2016-08-02 DIAGNOSIS — E785 Hyperlipidemia, unspecified: Secondary | ICD-10-CM | POA: Diagnosis not present

## 2016-08-02 NOTE — Patient Instructions (Signed)
Dr Sallyanne Kuster recommends that you have your loop recorder taken out on September 12th.

## 2016-08-02 NOTE — Progress Notes (Signed)
Cardiology Office Note    Date:  08/02/2016   ID:  Autumn Fitzgerald, Autumn Fitzgerald Nov 23, 1946, MRN [id number]  PCP:  Autumn Fendt, MD  Cardiologist:   Autumn Klein, MD   Chief Complaint  Patient presents with  . Follow-up    pt c/o SOB and palpitations    History of Present Illness:  Autumn Fitzgerald is a 71 y.o. 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 After an episode of near syncope many years ago, al oop recorder was implanted but has not shown any evidence of any meaningful arrhythmia (either on automatic recordings or patient activated symptom driven recordings). The device has reached replacement indicator and she wants it removed. She has a long-standing left bundle branch block  Has not had recurrent syncope or near syncope. She is troubled by hemorrhoids and is due to see Dr. Tharon Fitzgerald to discuss treatment. She complains of shortness of breath which she reports as worsening. Reviewing her activity level and symptoms and a little more detail it sounds that she has a same functional capacity that she has had for the last 3 or 4 years.    Past Medical History:  Diagnosis Date  . Arthritis   . Asthma   . Chest wall pain   . Depression   . GERD (gastroesophageal reflux disease)   . Head injury, closed, without LOC 02/2013   did not LOC per patient  . Headache   . HLD (hyperlipidemia)   . Hypertension    at times,  . Hyperthyroidism   . 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 Mental disorder   . Near syncope   . Palpitations   . Panic attacks     Past Surgical History:  Procedure Laterality Date  . CHOLECYSTECTOMY    . COLONOSCOPY N/A 11/24/2013   Procedure: COLONOSCOPY;  Surgeon: Autumn Craver, MD;  Location: [facility];  Service: Endoscopy;  Laterality: N/A;  . CORONARY ARTERY BYPASS GRAFT  02/05/2007  . DOPPLER ECHOCARDIOGRAPHY  03/14/2013   EF  65-70%,trivial AI,mild-mod. MR,Mod. TR,mod. concentric hypertrophy  . ESOPHAGOGASTRODUODENOSCOPY N/A 11/24/2013   Procedure: ESOPHAGOGASTRODUODENOSCOPY (EGD);  Surgeon: Autumn Craver, MD;  [facility];  Service: Endoscopy;  Laterality: N/A;  . KNEE ARTHROSCOPY Right 10/17/2013   Procedure: ARTHROSCOPY RIGHT KNEE;  Surgeon: Autumn Schooling, MD;  Location: [facility];  Service: Orthopedics;  Laterality: Right;  . LOOP RECORDER IMPLANT  03/18/2013  . LOOP RECORDER IMPLANT N/A 03/18/2013   Procedure: LOOP RECORDER IMPLANT;  Surgeon: Autumn Klein, MD;  Location: [facility];  Service: Cardiovascular;  Laterality: N/A;  . Nuclear Stress Test  11/30/2011   No ischemia  . Septal Myectomy    . TUBAL LIGATION      Current Medications: Outpatient Medications Prior to Visit  Medication Sig Dispense Refill  . atenolol (TENORMIN) 25 MG tablet Take 0.5 tablets (12.5 mg total) by mouth daily as needed (for high blood pressure). 45 tablet 3  . Cholecalciferol (VITAMIN D-3) 5000 UNITS TABS Take 5,000 Units by mouth daily.     . clobetasol cream (TEMOVATE) [id number] % Apply 1 application topically 2 (two) times a week. 30 g 2  . clonazePAM (KLONOPIN) 1 MG tablet Take 1 mg by mouth at bedtime.     Marland Kitchen ezetimibe-simvastatin (VYTORIN) 10-40 MG per tablet Take 0.5 tablets by mouth at bedtime.     Marland Kitchen levothyroxine (SYNTHROID) 50 MCG tablet Take 50 mcg  by mouth daily.     . Multiple Vitamin (MULTIVITAMIN) tablet Take 1 tablet by mouth daily.    . pantoprazole (PROTONIX) 20 MG tablet Take 20 mg by mouth daily.    . Probiotic Product (PROBIOTIC DAILY PO) Take 1 capsule by mouth daily.    Marland Kitchen Propylene Glycol (SYSTANE BALANCE OP) Apply 1 drop to eye 2 times daily at 12 noon and 4 pm.    . VENTOLIN HFA 108 (90 Base) MCG/ACT inhaler INHALE 2 PUFFS BY MOUTH EVERY 4 TO 6 HOURS AS NEEDED FOR COUGH OR WHEEZING 18 g 0  . clindamycin (CLEOCIN) 300 MG capsule Take 1 capsule (300 mg total) by mouth 3 (three) times daily. 24 capsule 0    . traMADol-acetaminophen (ULTRACET) 37.5-325 MG tablet Take 1 tablet by mouth every 6 (six) hours as needed for moderate pain. (Patient not taking: Reported on 08/02/2016) 15 tablet 0   No facility-administered medications prior to visit.      Allergies:   Amoxicillin-pot clavulanate; Statins; and Temazepam   Social History   Social History  . Marital status: Divorced    Spouse name: N/A  . Number of children: 5  . Years of education: 10   Social History Main Topics  . Smoking status: Never Smoker  . Smokeless tobacco: Never Used  . Alcohol use No  . Drug use: No  . Sexual activity: No     Comment: divorced   Other Topics Concern  . None   Social History Narrative   Lives at home by herself.    Caffeine use: none      Family History:  The patient's family history includes Cancer in her father and mother; Diabetes in her sister; Heart attack in her sister; Hypertension in her mother.   ROS:   Please see the history of present illness.    ROS All other systems reviewed and are negative.   PHYSICAL EXAM:   VS:  BP 116/68 (BP Location: Right Arm, Patient Position: Sitting, Cuff Size: Normal)   Pulse 81   Ht 5' (1.524 m)   Wt 137 lb (62.1 kg)   SpO2 97%   BMI 26.76 kg/m    GEN: Well nourished, well developed, in no acute distress  HEENT: normal  Neck: no JVD, carotid bruits, or masses Cardiac: Paradoxically split S2 RRR; no murmurs, rubs, or gallops,no edema . The loop recorder is relatively superficial and the site looks healthy Respiratory:  clear to auscultation bilaterally, normal work of breathing GI: soft, nontender, nondistended, + BS MS: no deformity or atrophy  Skin: warm and dry, no rash Neuro:  Alert and Oriented x 3, Strength and sensation are intact Psych: euthymic mood, full affect  Wt Readings from Last 3 Encounters:  08/02/16 137 lb (62.1 kg)  04/24/16 138 lb (62.6 kg)  11/08/15 135 lb 9.6 oz (61.5 kg)      Studies/Labs Reviewed:   EKG:   EKG is ordered today.  The ekg ordered today demonstrates Normal sinus rhythm, left bundle branch block with right axis deviation, QTC 464 ms  Recent Labs: 09/30/2015: ALT 26 01/28/2016: BUN 10; Creatinine, Ser 0.77; Hemoglobin 13.1; Platelets 247; Potassium 3.8; Sodium 139    ASSESSMENT:    1. Hypertrophic cardiomyopathy ([place])   2. Encounter for loop recorder at end of battery life   3. HLD (hyperlipidemia)      PLAN:  In order of problems listed above:  1. HCM: She has not had any evidence of outflow tract  obstruction following her myectomy. Her exertional dyspnea might be a symptom of diastolic dysfunction related to her cardiomyopathy. By exam she does not have evidence of overt heart failure. I suggested a trial of low-dose diuretics but she does not want to take any medications. She only takes her atenolol intermittently since she is afraid it might make her blood pressure too low. Overall, her symptom complex appears quite mild. 2. ILR: She has not had evidence of any nonsustained or sustained ventricular tachycardia or evidence of significant atrioventricular block in the 3 years that she has had the device in place. She wants it removed. I do not think he needs to be replaced. This procedure has been fully reviewed with the patient and informed consent has been obtained. 3. HLP: Labs monitored by her PCP     Medication Adjustments/Labs and Tests Ordered: Current medicines are reviewed at length with the patient today.  Concerns regarding medicines are outlined above.  Medication changes, Labs and Tests ordered today are listed in the Patient Instructions below. Patient Instructions  Dr Sallyanne Kuster recommends that you have your loop recorder taken out on September 12th.    Signed, Autumn Klein, MD  08/02/2016 5:56 PM    [address], [place], [place]  [address] Phone: [phone]; Fax: [phone]

## 2016-08-08 ENCOUNTER — Encounter: Payer: Self-pay | Admitting: Allergy and Immunology

## 2016-08-08 ENCOUNTER — Ambulatory Visit (INDEPENDENT_AMBULATORY_CARE_PROVIDER_SITE_OTHER): Payer: Medicare Other | Admitting: Allergy and Immunology

## 2016-08-08 VITALS — BP 108/72 | HR 76 | Resp 22

## 2016-08-08 DIAGNOSIS — J4540 Moderate persistent asthma, uncomplicated: Secondary | ICD-10-CM

## 2016-08-08 DIAGNOSIS — G4489 Other headache syndrome: Secondary | ICD-10-CM

## 2016-08-08 DIAGNOSIS — K219 Gastro-esophageal reflux disease without esophagitis: Secondary | ICD-10-CM

## 2016-08-08 DIAGNOSIS — J3089 Other allergic rhinitis: Secondary | ICD-10-CM | POA: Diagnosis not present

## 2016-08-08 NOTE — Progress Notes (Signed)
Follow-up Note  Referring Provider: Nolene Ebbs, MD Primary Provider: Philis Fendt, MD Date of Office Visit: 08/08/2016  Subjective:   Autumn Fitzgerald (DOB: 1946-07-25) is a 70 y.o. female who returns to the [facility] on 08/08/2016 in re-evaluation of the following:  HPI: [name] presents to this clinic in reevaluation of her respiratory tract issues with component of asthma and allergic rhinitis as well as issues related to her headaches and stomach problems. I have not seen her in his clinic since February 2017.  During the interval she has not required a systemic steroids for an exacerbation of asthma. She does have problems with dyspnea on exertion. She recently visited with her cardiologist who told her that she may have some congestive heart failure. In review of her echocardiogram obtained earlier last year it does appear as though she had diastolic dysfunction. She rarely uses a short acting bronchodilator. She is not the best about consistently using her Asmanex but she does receive this medication a few times per week.  She has had very little issues with her nose at this point in time and has not required an antibiotic to treat an episode of sinusitis.  Her headaches are now much better after having treatment administered by Dr. Orie Rout. She did receive steroid injections in her scalp.  Her stomach issue is still an issue and it is now directed by Dr. Collene Mares. She takes peppermint oil and probiotics and Protonix which she's not really sure as helped her very much. She did have a history of having gastric polyps removed in the past but it does not sound as though there is an indication for performing another upper endoscopy in evaluation of her GI complaints.  Since I have last seen her in his clinic she has had surgery on both eyes for what sounds like increased intraocular pressure and she is now on eyedrops for dry eye syndrome. She has a return visit  to see her ophthalmologist on September 5.    Medication List      atenolol 25 MG tablet Commonly known as:  TENORMIN Take 0.5 tablets (12.5 mg total) by mouth daily as needed (for high blood pressure).   clobetasol cream 0.05 % Commonly known as:  TEMOVATE Apply 1 application topically 2 (two) times a week.   clonazePAM 1 MG tablet Commonly known as:  KLONOPIN Take 1 mg by mouth at bedtime.   ezetimibe-simvastatin 10-40 MG tablet Commonly known as:  VYTORIN Take 0.5 tablets by mouth at bedtime.   multivitamin tablet Take 1 tablet by mouth daily.   OSTEO BI-FLEX/5-LOXIN ADVANCED PO Take by mouth.   pantoprazole 20 MG tablet Commonly known as:  PROTONIX Take 20 mg by mouth daily.   PROBIOTIC DAILY PO Take 1 capsule by mouth daily.   SYNTHROID 50 MCG tablet Generic drug:  levothyroxine Take 50 mcg by mouth daily.   SYSTANE BALANCE OP Apply 1 drop to eye 2 times daily at 12 noon and 4 pm.   VENTOLIN HFA 108 (90 Base) MCG/ACT inhaler Generic drug:  albuterol INHALE 2 PUFFS BY MOUTH EVERY 4 TO 6 HOURS AS NEEDED FOR COUGH OR WHEEZING   Vitamin D-3 5000 UNITS Tabs Take 5,000 Units by mouth daily.   XIIDRA 5 % Soln Generic drug:  Lifitegrast Apply 1 drop to eye daily.       Past Medical History:  Diagnosis Date  . Arthritis   . Asthma   . Chest  wall pain   . Depression   . GERD (gastroesophageal reflux disease)   . Head injury, closed, without LOC 02/2013   did not LOC per patient  . Headache   . HLD (hyperlipidemia)   . Hypertension    at times,  . Hyperthyroidism   . 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 Mental disorder   . Near syncope   . Palpitations   . Panic attacks     Past Surgical History:  Procedure Laterality Date  . CHOLECYSTECTOMY    . COLONOSCOPY N/A 11/24/2013   Procedure: COLONOSCOPY;  Surgeon: Juanita Craver, MD;  Location: [facility];  Service: Endoscopy;  Laterality: N/A;  . CORONARY ARTERY BYPASS GRAFT   02/05/2007  . DOPPLER ECHOCARDIOGRAPHY  03/14/2013   EF 65-70%,trivial AI,mild-mod. MR,Mod. TR,mod. concentric hypertrophy  . ESOPHAGOGASTRODUODENOSCOPY N/A 11/24/2013   Procedure: ESOPHAGOGASTRODUODENOSCOPY (EGD);  Surgeon: Juanita Craver, MD;  Location: [facility];  Service: Endoscopy;  Laterality: N/A;  . KNEE ARTHROSCOPY Right 10/17/2013   Procedure: ARTHROSCOPY RIGHT KNEE;  Surgeon: [name], MD;  Location: [facility];  Service: Orthopedics;  Laterality: Right;  . LOOP RECORDER IMPLANT  03/18/2013  . LOOP RECORDER IMPLANT N/A 03/18/2013   Procedure: LOOP RECORDER IMPLANT;  Surgeon: Sanda Klein, MD;  Location: [facility];  Service: Cardiovascular;  Laterality: N/A;  . Nuclear Stress Test  11/30/2011   No ischemia  . Septal Myectomy    . TUBAL LIGATION      Allergies  Allergen Reactions  . Amoxicillin-Pot Clavulanate Other (See Comments)    Severe pains and GI symptoms  . Statins Other (See Comments)    Myalgias with lipitor; tolerates vytorin  . Temazepam Other (See Comments)    Dizziness and doesn't work for patient    Review of systems negative except as noted in HPI / PMHx or noted below:  Review of Systems  Constitutional: Negative.   HENT: Negative.   Eyes: Negative.   Respiratory: Negative.   Cardiovascular: Negative.   Gastrointestinal: Negative.   Genitourinary: Negative.   Musculoskeletal: Negative.   Skin: Negative.   Neurological: Negative.   Endo/Heme/Allergies: Negative.   Psychiatric/Behavioral: Negative.      Objective:   Vitals:   08/08/16 1520  BP: 108/72  Pulse: 76  Resp: (!) 22          Physical Exam  Constitutional: She is well-developed, well-nourished, and in no distress.  HENT:  Head: Normocephalic.  Right Ear: Tympanic membrane, external ear and ear canal normal.  Left Ear: Tympanic membrane, external ear and ear canal normal.  Nose: Nose normal. No mucosal edema or rhinorrhea.  Mouth/Throat: Uvula is midline, oropharynx is clear  and moist and mucous membranes are normal. No oropharyngeal exudate.  Eyes: Conjunctivae are normal.  Neck: Trachea normal. No tracheal tenderness present. No tracheal deviation present. No thyromegaly present.  Cardiovascular: Normal rate, regular rhythm, S1 normal, S2 normal and normal heart sounds.   No murmur heard. Pulmonary/Chest: Breath sounds normal. No stridor. No respiratory distress. She has no wheezes. She has no rales.  Musculoskeletal: She exhibits no edema.  Lymphadenopathy:       Head (right side): No tonsillar adenopathy present.       Head (left side): No tonsillar adenopathy present.    She has no cervical adenopathy.  Neurological: She is alert. Gait normal.  Skin: No rash noted. She is not diaphoretic. No erythema. Nails show no clubbing.  Psychiatric: Mood and affect normal.    Diagnostics:  Spirometry was performed and demonstrated an FEV1 of 1.30 at 70 % of predicted.  The patient had an Asthma Control Test with the following results:  .    Assessment and Plan:   1. Moderate persistent asthma, uncomplicated   2. Other allergic rhinitis   3. Other headache syndrome   4. Gastroesophageal reflux disease, esophagitis presence not specified     1. Continue treatment with Dr. Collene Mares for GI issue  2. Continue Asmanex 220 one inhalation one time per day.   3. Continue ventolin HFA 2 puffs every 4-6 hours if needed.  4. Continue nasal saline and systane eye drops if needed.  5. Continue 'action plan' for asthma flare with Asmanex 220 two inhalations two times per day.  6. Obtain fall flu vaccine  7. Return in Jan 2018 or earlier if problem   Overall Daliya appears to be doing okay on her current plan regarding her respiratory tract. We'll continue to have her use Asmanex and Ventolin as needed. She will need to follow-up with Dr. Collene Mares concerning further management of her GI issue. I have encouraged her to get the flu vaccine sometime in the next month or so.  I will regroup with her in January or earlier if there is a problem.  Allena Katz, MD [facility]

## 2016-08-08 NOTE — Patient Instructions (Addendum)
  1. Continue treatment with Dr. Collene Mares for GI issue  2. Continue Asmanex 220 one inhalation one time per day.   3. Continue ventolin HFA 2 puffs every 4-6 hours if needed.  4. Continue nasal saline and systane eye drops if needed.  5. Continue 'action plan' for asthma flare with Asmanex 220 two inhalations two times per day.  6. Obtain fall flu vaccine  7. Return in Jan 2018 or earlier if problem

## 2016-08-15 ENCOUNTER — Other Ambulatory Visit: Payer: Self-pay | Admitting: Allergy and Immunology

## 2016-08-15 ENCOUNTER — Encounter: Payer: Self-pay | Admitting: *Deleted

## 2016-08-22 ENCOUNTER — Encounter (HOSPITAL_COMMUNITY)
Admission: RE | Disposition: A | Discharge status: Home / Self Care | Payer: Self-pay | Source: Ambulatory Visit | Attending: Cardiovascular Disease

## 2016-08-22 ENCOUNTER — Ambulatory Visit (HOSPITAL_COMMUNITY)
Admission: RE | Admit: 2016-08-22 | Discharge: 2016-08-22 | Disposition: A | Discharge status: Home / Self Care | Payer: Medicare Other | Source: Ambulatory Visit | Attending: Cardiovascular Disease | Admitting: Cardiovascular Disease

## 2016-08-22 ENCOUNTER — Other Ambulatory Visit: Payer: Self-pay | Admitting: Cardiovascular Disease

## 2016-08-22 DIAGNOSIS — I421 Obstructive hypertrophic cardiomyopathy: Secondary | ICD-10-CM | POA: Insufficient documentation

## 2016-08-22 DIAGNOSIS — F410 Panic disorder [episodic paroxysmal anxiety] without agoraphobia: Secondary | ICD-10-CM | POA: Insufficient documentation

## 2016-08-22 DIAGNOSIS — K219 Gastro-esophageal reflux disease without esophagitis: Secondary | ICD-10-CM | POA: Insufficient documentation

## 2016-08-22 DIAGNOSIS — F329 Major depressive disorder, single episode, unspecified: Secondary | ICD-10-CM | POA: Diagnosis not present

## 2016-08-22 DIAGNOSIS — I447 Left bundle-branch block, unspecified: Secondary | ICD-10-CM | POA: Diagnosis not present

## 2016-08-22 DIAGNOSIS — M1990 Unspecified osteoarthritis, unspecified site: Secondary | ICD-10-CM | POA: Insufficient documentation

## 2016-08-22 DIAGNOSIS — E785 Hyperlipidemia, unspecified: Secondary | ICD-10-CM | POA: Diagnosis not present

## 2016-08-22 DIAGNOSIS — E0590 Thyrotoxicosis, unspecified without thyrotoxic crisis or storm: Secondary | ICD-10-CM | POA: Insufficient documentation

## 2016-08-22 DIAGNOSIS — Z833 Family history of diabetes mellitus: Secondary | ICD-10-CM | POA: Diagnosis not present

## 2016-08-22 DIAGNOSIS — Z8249 Family history of ischemic heart disease and other diseases of the circulatory system: Secondary | ICD-10-CM | POA: Insufficient documentation

## 2016-08-22 DIAGNOSIS — J45909 Unspecified asthma, uncomplicated: Secondary | ICD-10-CM | POA: Diagnosis not present

## 2016-08-22 DIAGNOSIS — Z4509 Encounter for adjustment and management of other cardiac device: Secondary | ICD-10-CM | POA: Diagnosis not present

## 2016-08-22 DIAGNOSIS — I10 Essential (primary) hypertension: Secondary | ICD-10-CM | POA: Insufficient documentation

## 2016-08-22 HISTORY — PX: EP IMPLANTABLE DEVICE: SHX172B

## 2016-08-22 SURGERY — LOOP RECORDER REMOVAL

## 2016-08-22 MED ORDER — LIDOCAINE-EPINEPHRINE 1 %-1:100000 IJ SOLN
INTRAMUSCULAR | Status: DC | PRN
  Administered 2016-08-22: 30 mL

## 2016-08-22 MED ORDER — LIDOCAINE-EPINEPHRINE 1 %-1:100000 IJ SOLN
INTRAMUSCULAR | Status: AC
  Filled 2016-08-22: qty 1

## 2016-08-22 SURGICAL SUPPLY — 1 items: PACK LOOP INSERTION (CUSTOM PROCEDURE TRAY) ×3 IMPLANT

## 2016-08-22 NOTE — H&P (View-Only) (Signed)
Cardiology Office Note    Date:  08/02/2016   ID:  Emariah, Fayer 16-May-1946, MRN [id number]  PCP:  Philis Fendt, MD  Cardiologist:   Sanda Klein, MD   Chief Complaint  Patient presents with  . Follow-up    pt c/o SOB and palpitations    History of Present Illness:  Autumn Fitzgerald is a 70 y.o. 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 After an episode of near syncope many years ago, al oop recorder was implanted but has not shown any evidence of any meaningful arrhythmia (either on automatic recordings or patient activated symptom driven recordings). The device has reached replacement indicator and she wants it removed. She has a long-standing left bundle branch block  Has not had recurrent syncope or near syncope. She is troubled by hemorrhoids and is due to see Dr. Tharon Aquas to discuss treatment. She complains of shortness of breath which she reports as worsening. Reviewing her activity level and symptoms and a little more detail it sounds that she has a same functional capacity that she has had for the last 3 or 4 years.    Past Medical History:  Diagnosis Date  . Arthritis   . Asthma   . Chest wall pain   . Depression   . GERD (gastroesophageal reflux disease)   . Head injury, closed, without LOC 02/2013   did not LOC per patient  . Headache   . HLD (hyperlipidemia)   . Hypertension    at times,  . Hyperthyroidism   . 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 Mental disorder   . Near syncope   . Palpitations   . Panic attacks     Past Surgical History:  Procedure Laterality Date  . CHOLECYSTECTOMY    . COLONOSCOPY N/A 11/24/2013   Procedure: COLONOSCOPY;  Surgeon: Juanita Craver, MD;  Location: [facility];  Service: Endoscopy;  Laterality: N/A;  . CORONARY ARTERY BYPASS GRAFT  02/05/2007  . DOPPLER ECHOCARDIOGRAPHY  03/14/2013   EF  65-70%,trivial AI,mild-mod. MR,Mod. TR,mod. concentric hypertrophy  . ESOPHAGOGASTRODUODENOSCOPY N/A 11/24/2013   Procedure: ESOPHAGOGASTRODUODENOSCOPY (EGD);  Surgeon: Juanita Craver, MD;  Location: [facility];  Service: Endoscopy;  Laterality: N/A;  . KNEE ARTHROSCOPY Right 10/17/2013   Procedure: ARTHROSCOPY RIGHT KNEE;  Surgeon: [name], MD;  Location: [facility];  Service: Orthopedics;  Laterality: Right;  . LOOP RECORDER IMPLANT  03/18/2013  . LOOP RECORDER IMPLANT N/A 03/18/2013   Procedure: LOOP RECORDER IMPLANT;  Surgeon: Sanda Klein, MD;  Location: [facility];  Service: Cardiovascular;  Laterality: N/A;  . Nuclear Stress Test  11/30/2011   No ischemia  . Septal Myectomy    . TUBAL LIGATION      Current Medications: Outpatient Medications Prior to Visit  Medication Sig Dispense Refill  . atenolol (TENORMIN) 25 MG tablet Take 0.5 tablets (12.5 mg total) by mouth daily as needed (for high blood pressure). 45 tablet 3  . Cholecalciferol (VITAMIN D-3) 5000 UNITS TABS Take 5,000 Units by mouth daily.     . clobetasol cream (TEMOVATE) AB-123456789 % Apply 1 application topically 2 (two) times a week. 30 g 2  . clonazePAM (KLONOPIN) 1 MG tablet Take 1 mg by mouth at bedtime.     Marland Kitchen ezetimibe-simvastatin (VYTORIN) 10-40 MG per tablet Take 0.5 tablets by mouth at bedtime.     Marland Kitchen levothyroxine (SYNTHROID) 50 MCG tablet Take 50 mcg  by mouth daily.     . Multiple Vitamin (MULTIVITAMIN) tablet Take 1 tablet by mouth daily.    . pantoprazole (PROTONIX) 20 MG tablet Take 20 mg by mouth daily.    . Probiotic Product (PROBIOTIC DAILY PO) Take 1 capsule by mouth daily.    Marland Kitchen Propylene Glycol (SYSTANE BALANCE OP) Apply 1 drop to eye 2 times daily at 12 noon and 4 pm.    . VENTOLIN HFA 108 (90 Base) MCG/ACT inhaler INHALE 2 PUFFS BY MOUTH EVERY 4 TO 6 HOURS AS NEEDED FOR COUGH OR WHEEZING 18 g 0  . clindamycin (CLEOCIN) 300 MG capsule Take 1 capsule (300 mg total) by mouth 3 (three) times daily. 24 capsule 0    . traMADol-acetaminophen (ULTRACET) 37.5-325 MG tablet Take 1 tablet by mouth every 6 (six) hours as needed for moderate pain. (Patient not taking: Reported on 08/02/2016) 15 tablet 0   No facility-administered medications prior to visit.      Allergies:   Amoxicillin-pot clavulanate; Statins; and Temazepam   Social History   Social History  . Marital status: Divorced    Spouse name: N/A  . Number of children: 5  . Years of education: 10   Social History Main Topics  . Smoking status: Never Smoker  . Smokeless tobacco: Never Used  . Alcohol use No  . Drug use: No  . Sexual activity: No     Comment: divorced   Other Topics Concern  . None   Social History Narrative   Lives at home by herself.    Caffeine use: none      Family History:  The patient's family history includes Cancer in her father and mother; Diabetes in her sister; Heart attack in her sister; Hypertension in her mother.   ROS:   Please see the history of present illness.    ROS All other systems reviewed and are negative.   PHYSICAL EXAM:   VS:  BP 116/68 (BP Location: Right Arm, Patient Position: Sitting, Cuff Size: Normal)   Pulse 81   Ht 5' (1.524 m)   Wt 137 lb (62.1 kg)   SpO2 97%   BMI 26.76 kg/m    GEN: Well nourished, well developed, in no acute distress  HEENT: normal  Neck: no JVD, carotid bruits, or masses Cardiac: Paradoxically split S2 RRR; no murmurs, rubs, or gallops,no edema . The loop recorder is relatively superficial and the site looks healthy Respiratory:  clear to auscultation bilaterally, normal work of breathing GI: soft, nontender, nondistended, + BS MS: no deformity or atrophy  Skin: warm and dry, no rash Neuro:  Alert and Oriented x 3, Strength and sensation are intact Psych: euthymic mood, full affect  Wt Readings from Last 3 Encounters:  08/02/16 137 lb (62.1 kg)  04/24/16 138 lb (62.6 kg)  11/08/15 135 lb 9.6 oz (61.5 kg)      Studies/Labs Reviewed:   EKG:   EKG is ordered today.  The ekg ordered today demonstrates Normal sinus rhythm, left bundle branch block with right axis deviation, QTC 464 ms  Recent Labs: 09/30/2015: ALT 26 01/28/2016: BUN 10; Creatinine, Ser 0.77; Hemoglobin 13.1; Platelets 247; Potassium 3.8; Sodium 139    ASSESSMENT:    1. Hypertrophic cardiomyopathy ([place])   2. Encounter for loop recorder at end of battery life   3. HLD (hyperlipidemia)      PLAN:  In order of problems listed above:  1. HCM: She has not had any evidence of outflow tract  obstruction following her myectomy. Her exertional dyspnea might be a symptom of diastolic dysfunction related to her cardiomyopathy. By exam she does not have evidence of overt heart failure. I suggested a trial of low-dose diuretics but she does not want to take any medications. She only takes her atenolol intermittently since she is afraid it might make her blood pressure too low. Overall, her symptom complex appears quite mild. 2. ILR: She has not had evidence of any nonsustained or sustained ventricular tachycardia or evidence of significant atrioventricular block in the 3 years that she has had the device in place. She wants it removed. I do not think he needs to be replaced. This procedure has been fully reviewed with the patient and informed consent has been obtained. 3. HLP: Labs monitored by her PCP     Medication Adjustments/Labs and Tests Ordered: Current medicines are reviewed at length with the patient today.  Concerns regarding medicines are outlined above.  Medication changes, Labs and Tests ordered today are listed in the Patient Instructions below. Patient Instructions  Dr Sallyanne Kuster recommends that you have your loop recorder taken out on September 12th.    Signed, Sanda Klein, MD  08/02/2016 5:56 PM    [address], [place], [place]  [address] Phone: [phone]; Fax: [phone]

## 2016-08-22 NOTE — Interval H&P Note (Signed)
History and Physical Interval Note:  08/22/2016 1:14 PM  Autumn Fitzgerald  has presented today for surgery, with the diagnosis of eol  The various methods of treatment have been discussed with the patient and family. After consideration of risks, benefits and other options for treatment, the patient has consented to  Procedure(s): Loop Recorder Removal (N/A) as a surgical intervention .  The patient's history has been reviewed, patient examined, no change in status, stable for surgery.  I have reviewed the patient's chart and labs.  Questions were answered to the patient's satisfaction.     Renny Gunnarson

## 2016-08-22 NOTE — Interval H&P Note (Signed)
History and Physical Interval Note:  08/22/2016 1:14 PM  Autumn Fitzgerald  has presented today for surgery, with the diagnosis of loop recorder at end of service.  The various methods of treatment have been discussed with the patient and family. After consideration of risks, benefits and other options for treatment, the patient has consented to  Procedure(s): Loop Recorder Removal (N/A) as a surgical intervention .  The patient's history has been reviewed, patient examined, no change in status, stable for surgery.  I have reviewed the patient's chart and labs.  Questions were answered to the patient's satisfaction.     Andreea Arca

## 2016-08-22 NOTE — Discharge Instructions (Signed)
   Call for any signs and/or symptoms of infection: Fever, redness, swelling, or warmth at the site. Leave steri strips in place- they will come off naturally.

## 2016-08-23 ENCOUNTER — Encounter (HOSPITAL_COMMUNITY): Payer: Self-pay | Admitting: Cardiovascular Disease

## 2016-08-29 ENCOUNTER — Telehealth: Payer: Self-pay | Admitting: Cardiovascular Disease

## 2016-08-31 ENCOUNTER — Ambulatory Visit (INDEPENDENT_AMBULATORY_CARE_PROVIDER_SITE_OTHER): Payer: Medicare Other | Admitting: *Deleted

## 2016-08-31 DIAGNOSIS — Z9889 Other specified postprocedural states: Secondary | ICD-10-CM

## 2016-08-31 NOTE — Progress Notes (Signed)
Wound check appointment s/p ILR removal. Steri-strips removed by patient at home. Wound without redness or edema. Incision edges approximated, wound well healed. Patient instructed to call DC with any s/s of infection. ROV with [facility] in 72mo.

## 2016-09-13 ENCOUNTER — Other Ambulatory Visit: Payer: Self-pay | Admitting: Allergy and Immunology

## 2016-09-30 ENCOUNTER — Other Ambulatory Visit: Payer: Self-pay | Admitting: Cardiology

## 2016-09-30 ENCOUNTER — Emergency Department (HOSPITAL_COMMUNITY)
Admission: EM | Admit: 2016-09-30 | Discharge: 2016-09-30 | Disposition: A | Discharge status: Home / Self Care | Payer: Medicare Other | Attending: Emergency Medicine | Admitting: Emergency Medicine

## 2016-09-30 ENCOUNTER — Emergency Department (HOSPITAL_COMMUNITY): Payer: Medicare Other

## 2016-09-30 ENCOUNTER — Encounter (HOSPITAL_COMMUNITY): Payer: Self-pay | Admitting: Emergency Medicine

## 2016-09-30 DIAGNOSIS — R0789 Other chest pain: Secondary | ICD-10-CM

## 2016-09-30 DIAGNOSIS — I10 Essential (primary) hypertension: Secondary | ICD-10-CM | POA: Diagnosis not present

## 2016-09-30 DIAGNOSIS — Z79899 Other long term (current) drug therapy: Secondary | ICD-10-CM | POA: Diagnosis not present

## 2016-09-30 DIAGNOSIS — Z951 Presence of aortocoronary bypass graft: Secondary | ICD-10-CM | POA: Insufficient documentation

## 2016-09-30 DIAGNOSIS — I421 Obstructive hypertrophic cardiomyopathy: Secondary | ICD-10-CM | POA: Insufficient documentation

## 2016-09-30 DIAGNOSIS — J45909 Unspecified asthma, uncomplicated: Secondary | ICD-10-CM | POA: Diagnosis not present

## 2016-09-30 DIAGNOSIS — R079 Chest pain, unspecified: Secondary | ICD-10-CM

## 2016-09-30 DIAGNOSIS — R110 Nausea: Secondary | ICD-10-CM | POA: Diagnosis not present

## 2016-09-30 LAB — BASIC METABOLIC PANEL
Anion gap: 10 (ref 5–15)
BUN: 14 mg/dL (ref 6–20)
CO2: 23 mmol/L (ref 22–32)
Calcium: 9.1 mg/dL (ref 8.9–10.3)
Chloride: 104 mmol/L (ref 101–111)
Creatinine, Ser: 0.76 mg/dL (ref 0.44–1.00)
GFR calc Af Amer: >60 mL/min (ref >60)
GFR calc non Af Amer: >60 mL/min (ref >60)
Glucose, Bld: 108 mg/dL — ABNORMAL HIGH (ref 65–99)
Potassium: 3.8 mmol/L (ref 3.5–5.1)
Sodium: 137 mmol/L (ref 135–145)

## 2016-09-30 LAB — TROPONIN I: Troponin I: <0.03 ng/mL (ref <0.03)

## 2016-09-30 LAB — CBC
HCT: 44.3 % (ref 36.0–46.0)
Hemoglobin: 14.7 g/dL (ref 12.0–15.0)
MCH: 28.5 pg (ref 26.0–34.0)
MCHC: 33.2 g/dL (ref 30.0–36.0)
MCV: 85.9 fL (ref 78.0–100.0)
Platelets: 255 10*3/uL (ref 150–400)
RBC: 5.16 MIL/uL — ABNORMAL HIGH (ref 3.87–5.11)
RDW: 13.6 % (ref 11.5–15.5)
WBC: 7.1 10*3/uL (ref 4.0–10.5)

## 2016-09-30 LAB — I-STAT TROPONIN, ED: Troponin i, poc: 0 ng/mL (ref 0.00–0.08)

## 2016-09-30 LAB — BRAIN NATRIURETIC PEPTIDE: B Natriuretic Peptide: 66.6 pg/mL (ref 0.0–100.0)

## 2016-09-30 MED ORDER — ASPIRIN 81 MG PO CHEW
324.0000 mg | CHEWABLE_TABLET | Freq: Once | ORAL | Status: DC
  Filled 2016-09-30: qty 4

## 2016-09-30 NOTE — ED Notes (Signed)
Dr. Reather Converse notified of troponin result.

## 2016-09-30 NOTE — Progress Notes (Signed)
Outpatient NST order has been placed.   Autumn Fitzgerald Rosita Fire 09/30/2016

## 2016-09-30 NOTE — ED Provider Notes (Signed)
[facility] DEPT Provider Note   CSN: [id number] Arrival date & time: 09/30/16  1347     History   Chief Complaint Chief Complaint  Patient presents with  . Hypertension  . Chest Pain    HPI CAYLEI HAWKIN is a 70 y.o. female.  70 year old female with past medical history including hypertrophic obstructive cardiomyopathy, LBBB, hypertension who presents with chest pain. 5 days ago, the patient began noticing problems with hypertension. Around the same time, she began having central to right-sided chest pain which she states occasionally cuts through to her "lung." The pain is intermittent, lasts a few minutes in duration, and is associated with shortness of breath. She endorses nausea, no vomiting. She does not regularly take atenolol. She denies any recent travel, leg swelling, history of blood clots, or history of cancer. She reports not feeling well this week with generalized fatigue. She has had a long-standing cough related to allergies. She denies any fevers.   The history is provided by the patient.  Hypertension  Associated symptoms include chest pain.  Chest Pain    Her past medical history is significant for hypertension.    Past Medical History:  Diagnosis Date  . Arthritis   . Asthma   . Chest wall pain   . Depression   . GERD (gastroesophageal reflux disease)   . Head injury, closed, without LOC 02/2013   did not LOC per patient  . Headache   . HLD (hyperlipidemia)   . Hypertension    at times,  . Hyperthyroidism   . 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 Mental disorder   . Near syncope   . Palpitations   . Panic attacks     Patient Active Problem List   Diagnosis Date Noted  . Encounter for loop recorder at end of battery life 08/02/2016  . New onset of headaches after age 89 09/30/2015  . Dizziness 09/30/2015  . Vision loss, bilateral 09/30/2015  . Lichen sclerosus et atrophicus 06/18/2014  . Benign gastric polyp 02/20/2014    . Degenerative joint disease involving multiple joints 09/10/2013  . Hypertrophic obstructive cardiomyopathy ([place]) 07/23/2013  . LBBB (left bundle branch block) 07/23/2013  . Subclinical hypothyroidism 05/14/2012  . Lower abdominal pain 01/10/2012  . Panic attacks   . GERD (gastroesophageal reflux disease)   . HLD (hyperlipidemia)   . Asthma   . HTN (hypertension)   . Palpitations   . Allergic rhinitis 02/24/2011  . Anxiety, generalized 02/24/2011  . 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 CHOLECYSTECTOMY    . COLONOSCOPY N/A 11/24/2013   Procedure: COLONOSCOPY;  Surgeon: Juanita Craver, MD;  Location: [facility];  Service: Endoscopy;  Laterality: N/A;  . CORONARY ARTERY BYPASS GRAFT  02/05/2007  . DOPPLER ECHOCARDIOGRAPHY  03/14/2013   EF 65-70%,trivial AI,mild-mod. MR,Mod. TR,mod. concentric hypertrophy  . EP IMPLANTABLE DEVICE N/A 08/22/2016   Procedure: Loop Recorder Removal;  Surgeon: Sanda Klein, MD;  Location: [facility];  Service: Cardiovascular;  Laterality: N/A;  . ESOPHAGOGASTRODUODENOSCOPY N/A 11/24/2013   Procedure: ESOPHAGOGASTRODUODENOSCOPY (EGD);  Surgeon: Juanita Craver, MD;  Location: [facility];  Service: Endoscopy;  Laterality: N/A;  . KNEE ARTHROSCOPY Right 10/17/2013   Procedure: ARTHROSCOPY RIGHT KNEE;  Surgeon: [name], MD;  Location: [facility];  Service: Orthopedics;  Laterality: Right;  . LOOP RECORDER IMPLANT  03/18/2013  . LOOP RECORDER IMPLANT N/A 03/18/2013   Procedure: LOOP RECORDER IMPLANT;  Surgeon:  Sanda Klein, MD;  Location: [facility];  Service: Cardiovascular;  Laterality: N/A;  . Nuclear Stress Test  11/30/2011   No ischemia  . Septal Myectomy    . TUBAL LIGATION      OB History    Gravida Para Term Preterm AB Living   5 5 5     4    SAB TAB Ectopic Multiple Live Births                   Home Medications    Prior to Admission medications    Medication Sig Start Date End Date Taking? Authorizing Provider  atenolol (TENORMIN) 25 MG tablet Take 0.5 tablets (12.5 mg total) by mouth daily as needed (for high blood pressure). 12/21/14   Mihai Croitoru, MD  B Complex Vitamins (B COMPLEX-B12 PO) Take 1 tablet by mouth daily.     Historical Provider, MD  Cholecalciferol (VITAMIN D-3) 5000 UNITS TABS Take 5,000 Units by mouth daily.     Historical Provider, MD  clobetasol cream (TEMOVATE) AB-123456789 % Apply 1 application topically 2 (two) times a week. 04/24/16   Peggy Constant, MD  clonazePAM (KLONOPIN) 1 MG tablet Take 1 mg by mouth at bedtime.     Historical Provider, MD  ezetimibe-simvastatin (VYTORIN) 10-40 MG per tablet Take 0.5 tablets by mouth at bedtime.     Historical Provider, MD  levothyroxine (SYNTHROID) 50 MCG tablet Take 50 mcg by mouth daily.  06/15/14   Historical Provider, MD  Lifitegrast Shirley Friar) 5 % SOLN Place 1 drop into both eyes 2 (two) times daily.     Historical Provider, MD  Misc Natural Products (OSTEO BI-FLEX/5-LOXIN ADVANCED PO) Take 2 tablets by mouth daily.     Historical Provider, MD  Multiple Vitamin (MULTIVITAMIN) tablet Take 1 tablet by mouth daily.    Historical Provider, MD  OVER THE COUNTER MEDICATION Take 1 tablet by mouth daily. IBgard     Historical Provider, MD  pantoprazole (PROTONIX) 40 MG tablet Take 40 mg by mouth daily.  05/16/16   Historical Provider, MD  Probiotic Product ([place] PO) Take 1 tablet by mouth daily.     Historical Provider, MD  Propylene Glycol (SYSTANE BALANCE OP) Apply 1 drop to eye 2 times daily at 12 noon and 4 pm.    Historical Provider, MD  VENTOLIN HFA 108 (90 Base) MCG/ACT inhaler INHALE 2 PUFFS BY MOUTH EVERY 4 TO 6 HOURS AS NEEDED FOR COUGH OR WHEEZING 07/14/16   Jiles Prows, MD  VENTOLIN HFA 108 (90 Base) MCG/ACT inhaler INHALE 2 PUFFS BY MOUTH EVERY 4 TO 6 HOURS AS NEEDED FOR COUGH OR WHEEZING 09/13/16   Jiles Prows, MD    Family History Family History   Problem Relation Age of Onset  . Cancer Mother     breast  . Hypertension Mother   . Cancer Father     prostate  . Diabetes Sister   . Heart attack Sister   . Migraines Neg Hx     Social History Social History  Substance Use Topics  . Smoking status: Never Smoker  . Smokeless tobacco: Never Used  . Alcohol use No     Allergies   Amoxicillin-pot clavulanate; Statins; and Temazepam   Review of Systems Review of Systems  Cardiovascular: Positive for chest pain.   10 Systems reviewed and are negative for acute change except as noted in the HPI.   Physical Exam Updated Vital Signs BP 104/83   Pulse  76   Temp 97.8 F (36.6 C) (Oral)   Resp 20   Ht 5' (1.524 m)   Wt 137 lb (62.1 kg)   SpO2 95%   BMI 26.76 kg/m   Physical Exam  Constitutional: She is oriented to person, place, and time. She appears well-developed and well-nourished. No distress.  HENT:  Head: Normocephalic and atraumatic.  Moist mucous membranes  Eyes: Conjunctivae are normal. Pupils are equal, round, and reactive to light.  Neck: Neck supple.  Cardiovascular: Normal rate, regular rhythm and normal heart sounds.   No murmur heard. Sternotomy scar  Pulmonary/Chest: Effort normal and breath sounds normal.  Abdominal: Soft. Bowel sounds are normal. She exhibits no distension. There is no tenderness.  Musculoskeletal: She exhibits no edema.  Neurological: She is alert and oriented to person, place, and time.  Fluent speech  Skin: Skin is warm and dry.  Psychiatric: She has a normal mood and affect. Judgment normal.  Nursing note and vitals reviewed.    ED Treatments / Results  Labs (all labs ordered are listed, but only abnormal results are displayed) Labs Reviewed  BASIC METABOLIC PANEL - Abnormal; Notable for the following:       Result Value   Glucose, Bld 108 (*)    All other components within normal limits  CBC - Abnormal; Notable for the following:    RBC 5.16 (*)    All other  components within normal limits  BRAIN NATRIURETIC PEPTIDE  I-STAT TROPOININ, ED    EKG  EKG Interpretation  Date/Time:  Saturday September 30 2016 13:53:14 EDT Ventricular Rate:  78 PR Interval:  140 QRS Duration: 118 QT Interval:  400 QTC Calculation: 456 R Axis:   102 Text Interpretation:  Sinus rhythm with Premature atrial complexes Possible Left atrial enlargement Rightward axis Septal infarct , age undetermined ST & T wave abnormality, consider inferolateral ischemia Abnormal ECG No significant change since last tracing Confirmed by Deral Schellenberg MD, Kareen Hitsman [id number]) on 09/30/2016 1:59:15 PM Also confirmed by Nicolae Vasek MD, [place] [phone]), editor Yehuda Mao [phone])  on 09/30/2016 2:42:16 PM       Radiology Dg Chest 2 View  Result Date: 09/30/2016 CLINICAL DATA:  70 year old female with history of right-sided chest pain and pressure and nausea. EXAM: CHEST  2 VIEW COMPARISON:  Chest x-ray 01/28/2016. FINDINGS: Lung volumes are normal. No consolidative airspace disease. No pleural effusions. No pneumothorax. No pulmonary nodule or mass noted. Pulmonary vasculature and the cardiomediastinal silhouette are within normal limits. Status post median sternotomy. Surgical clips project over the right upper quadrant of the abdomen, likely from prior cholecystectomy. IMPRESSION: No radiographic evidence of acute cardiopulmonary disease. Electronically Signed   By: Vinnie Langton M.D.   On: 09/30/2016 14:29    Procedures Procedures (including critical care time)  Medications Ordered in ED Medications  aspirin chewable tablet 324 mg (324 mg Oral Not Given 09/30/16 1448)     Initial Impression / Assessment and Plan / ED Course  I have reviewed the triage vital signs and the nursing notes.  Pertinent labs & imaging results that were available during my care of the patient were reviewed by me and considered in my medical decision making (see chart for details).  Clinical Course    Pt w/ h/o  HOCM p/w several days of intermittent CP as well as HTN. She was well-appearing on exam with normal vital signs. EKG without acute ischemic changes. Initial troponin normal and lab work reassuring. Chest x-ray negative for acute process. The  patient has no risk factors for PE and with normal vital signs I feel PE is very unlikely. Given patient's cardiac history, consulted cardiology for evaluation. Discussed w/ Dr. Harl Bowie, appreciate assistance. He will evaluate pt and determine next step in care. I am signing out to the oncoming provider who will follow-up on cardiology recommendations.  Final Clinical Impressions(s) / ED Diagnoses   Final diagnoses:  None    New Prescriptions New Prescriptions   No medications on file     Sharlett Iles, MD 09/30/16 1551

## 2016-09-30 NOTE — ED Triage Notes (Signed)
Pt. Stated, I've been having high blood pressure and chest pain since Monday.

## 2016-09-30 NOTE — ED Provider Notes (Signed)
Patient care signed out to follow up cardiology recommendations which are to follow up for outpatient stress test, troponins negative, chest pain free.   Eyvette Cordon, Paula Compton, MD 09/30/16 [phone]

## 2016-09-30 NOTE — Discharge Instructions (Signed)
Follow up as directed by cardiology.  If you were given medicines take as directed.  If you are on coumadin or contraceptives realize their levels and effectiveness is altered by many different medicines.  If you have any reaction (rash, tongues swelling, other) to the medicines stop taking and see a physician.    If your blood pressure was elevated in the ER make sure you follow up for management with a primary doctor or return for chest pain, shortness of breath or stroke symptoms.  Please follow up as directed and return to the ER or see a physician for new or worsening symptoms.  Thank you. Vitals:   09/30/16 1715 09/30/16 1730 09/30/16 1745 09/30/16 1800  BP: 108/77 115/74 110/72 109/72  Pulse: 73 71 70 74  Resp: 19 21 19 22   Temp:      TempSrc:      SpO2: 98% 99% 98% 96%  Weight:      Height:

## 2016-09-30 NOTE — Consult Note (Signed)
Patient ID: Autumn Fitzgerald MRN: [id number], DOB/AGE: 1946/12/05   Admit date: 09/30/2016   Reason for Consult: Chest Pain Requesting MD: Dr. Rex Kras, ED   Primary Physician: Philis Fendt, MD Primary Cardiologist: Dr. Sallyanne Kuster   Pt. Profile:  70 y/o female with h/o HCM s/p septal myectomy and h/o HTN and LBBB presenting to the ED with atypical CP.   Problem List  Past Medical History:  Diagnosis Date  . Arthritis   . Asthma   . Chest wall pain   . Depression   . GERD (gastroesophageal reflux disease)   . Head injury, closed, without LOC 02/2013   did not LOC per patient  . Headache   . HLD (hyperlipidemia)   . Hypertension    at times,  . Hyperthyroidism   . 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 Mental disorder   . Near syncope   . Palpitations   . Panic attacks     Past Surgical History:  Procedure Laterality Date  . CHOLECYSTECTOMY    . COLONOSCOPY N/A 11/24/2013   Procedure: COLONOSCOPY;  Surgeon: Juanita Craver, MD;  Location: [facility];  Service: Endoscopy;  Laterality: N/A;  . CORONARY ARTERY BYPASS GRAFT  02/05/2007  . DOPPLER ECHOCARDIOGRAPHY  03/14/2013   EF 65-70%,trivial AI,mild-mod. MR,Mod. TR,mod. concentric hypertrophy  . EP IMPLANTABLE DEVICE N/A 08/22/2016   Procedure: Loop Recorder Removal;  Surgeon: Sanda Klein, MD;  Location: [facility];  Service: Cardiovascular;  Laterality: N/A;  . ESOPHAGOGASTRODUODENOSCOPY N/A 11/24/2013   Procedure: ESOPHAGOGASTRODUODENOSCOPY (EGD);  Surgeon: Juanita Craver, MD;  Location: [facility];  Service: Endoscopy;  Laterality: N/A;  . KNEE ARTHROSCOPY Right 10/17/2013   Procedure: ARTHROSCOPY RIGHT KNEE;  Surgeon: [name], MD;  Location: [place];  Service: Orthopedics;  Laterality: Right;  . LOOP RECORDER IMPLANT  03/18/2013  . LOOP RECORDER IMPLANT N/A 03/18/2013   Procedure: LOOP RECORDER IMPLANT;  Surgeon: Sanda Klein, MD;  Location: [facility];  Service: Cardiovascular;   Laterality: N/A;  . Nuclear Stress Test  11/30/2011   No ischemia  . Septal Myectomy    . TUBAL LIGATION       Allergies  Allergies  Allergen Reactions  . Amoxicillin-Pot Clavulanate Other (See Comments)    Severe pains and GI symptoms  . Statins Other (See Comments)    Myalgias with lipitor; tolerates vytorin  . Temazepam Other (See Comments)    Dizziness and doesn't work for patient    HPI  70 y/o female, followed by Dr. Sallyanne Kuster. She has a h/o previous septal myectomy for hypertrophic obstructive cardiomyopathy many years ago. She also had a loop recorder, previously that was implanted for an episode of near syncope, many years ago. This did not show any evidence of any meaningful arrhythmia (either on automatic recordings or patient activated symptom driven recordings). The device has reached replacement indicator and it was recently removed on 08/22/16. She denied any recent h/o syncope/ near syncope.  She also has a long standing h/o LBBB and h/o HTN. Per records, she had a NST in 2012 that was low risk.   She presented to the [facility] ED today with a CC of chest pain. Symptoms first started 5 days ago. Also in the setting of increased BP. She admits that she does not take blood pressure medications routinely. She only takes them as needed. Recent systolic pressures at home have ranged from the 150s-190s. Her chest pain symptoms are atypical. Occurs at 2 locations. One location is  at the mid right sternal border and the other location is underneath her left breast near the nipple line. Pain is described as a sharp stabbing pain radiating towards her back. It last several seconds before spontaneously resolving. She denies any chest pressure, tightness or heaviness. Her pain is nonexertional. She lives in a two-story home with her bedroom on the second floor. She denies any exertional chest pain walking up and down her stairs. She does note a history of chronic exertional dyspnea walking up hills  but has very little limitations walking on flat surfaces. There is no relationship with meals. No particular alleviating factors. Not worse with deep breathing. She has some tenderness with palpation of the chest wall however she reports that this is different from the chest pain that brought her to the ED. She denies tobacco use. No history of diabetes. She reports history of hyperlipidemia but states that she takes medications at home. She is currently CP free. EKG shows chronic LBBB. POC troponin is negative. BNP is WNL at 66. CXR unremarkable.   Her BP is controlled in ED at 104/83.   Home Medications  Prior to Admission medications   Medication Sig Start Date End Date Taking? Authorizing Provider  atenolol (TENORMIN) 25 MG tablet Take 0.5 tablets (12.5 mg total) by mouth daily as needed (for high blood pressure). 12/21/14   Mihai Croitoru, MD  B Complex Vitamins (B COMPLEX-B12 PO) Take 1 tablet by mouth daily.     Historical Provider, MD  Cholecalciferol (VITAMIN D-3) 5000 UNITS TABS Take 5,000 Units by mouth daily.     Historical Provider, MD  clobetasol cream (TEMOVATE) [id number] % Apply 1 application topically 2 (two) times a week. 04/24/16   Peggy Constant, MD  clonazePAM (KLONOPIN) 1 MG tablet Take 1 mg by mouth at bedtime.     Historical Provider, MD  ezetimibe-simvastatin (VYTORIN) 10-40 MG per tablet Take 0.5 tablets by mouth at bedtime.     Historical Provider, MD  levothyroxine (SYNTHROID) 50 MCG tablet Take 50 mcg by mouth daily.  06/15/14   Historical Provider, MD  Lifitegrast Shirley Friar) 5 % SOLN Place 1 drop into both eyes 2 (two) times daily.     Historical Provider, MD  Misc Natural Products (OSTEO BI-FLEX/5-LOXIN ADVANCED PO) Take 2 tablets by mouth daily.     Historical Provider, MD  Multiple Vitamin (MULTIVITAMIN) tablet Take 1 tablet by mouth daily.    Historical Provider, MD  OVER THE COUNTER MEDICATION Take 1 tablet by mouth daily. IBgard     Historical Provider, MD  pantoprazole  (PROTONIX) 40 MG tablet Take 40 mg by mouth daily.  05/16/16   Historical Provider, MD  Probiotic Product ([place] PO) Take 1 tablet by mouth daily.     Historical Provider, MD  Propylene Glycol (SYSTANE BALANCE OP) Apply 1 drop to eye 2 times daily at 12 noon and 4 pm.    Historical Provider, MD  VENTOLIN HFA 108 (90 Base) MCG/ACT inhaler INHALE 2 PUFFS BY MOUTH EVERY 4 TO 6 HOURS AS NEEDED FOR COUGH OR WHEEZING 07/14/16   Jiles Prows, MD  VENTOLIN HFA 108 (90 Base) MCG/ACT inhaler INHALE 2 PUFFS BY MOUTH EVERY 4 TO 6 HOURS AS NEEDED FOR COUGH OR WHEEZING 09/13/16   Jiles Prows, MD    Family History  Family History  Problem Relation Age of Onset  . Cancer Mother     breast  . Hypertension Mother   . Cancer Father  prostate  . Diabetes Sister   . Heart attack Sister   . Migraines Neg Hx     Social History  Social History   Social History  . Marital status: Divorced    Spouse name: N/A  . Number of children: 5  . Years of education: 10   Occupational History  . Not on file.   Social History Main Topics  . Smoking status: Never Smoker  . Smokeless tobacco: Never Used  . Alcohol use No  . Drug use: No  . Sexual activity: No     Comment: divorced   Other Topics Concern  . Not on file   Social History Narrative   Lives at home by herself.    Caffeine use: none      Review of Systems General:  No chills, fever, night sweats or weight changes.  Cardiovascular:  No chest pain, dyspnea on exertion, edema, orthopnea, palpitations, paroxysmal nocturnal dyspnea. Dermatological: No rash, lesions/masses Respiratory: No cough, dyspnea Urologic: No hematuria, dysuria Abdominal:   No nausea, vomiting, diarrhea, bright red blood per rectum, melena, or hematemesis Neurologic:  No visual changes, wkns, changes in mental status. All other systems reviewed and are otherwise negative except as noted above.  Physical Exam  Blood pressure 104/83, pulse 76,  temperature 97.8 F (36.6 C), temperature source Oral, resp. rate 20, height 5' (1.524 m), weight 137 lb (62.1 kg), SpO2 95 %.  General: Pleasant, NAD Psych: Normal affect. Neuro: Alert and oriented X 3. Moves all extremities spontaneously. HEENT: Normal  Neck: Supple without bruits or JVD. Lungs:  Resp regular and unlabored, CTA. Heart: RRR no s3, s4, or murmurs. Abdomen: Soft, non-tender, non-distended, BS + x 4.  Extremities: No clubbing, cyanosis or edema. DP/PT/Radials 2+ and equal bilaterally.  Labs  Troponin [facility] of Care Test)  Recent Labs  09/30/16 1411  TROPIPOC 0.00   No results for input(s): CKTOTAL, CKMB, TROPONINI in the last 72 hours. Lab Results  Component Value Date   WBC 7.1 09/30/2016   HGB 14.7 09/30/2016   HCT 44.3 09/30/2016   MCV 85.9 09/30/2016   PLT 255 09/30/2016    Recent Labs Lab 09/30/16 1406  NA 137  K 3.8  CL 104  CO2 23  BUN 14  CREATININE 0.76  CALCIUM 9.1  GLUCOSE 108*   Lab Results  Component Value Date   CHOL 145 09/23/2008   HDL 53 09/23/2008   LDLCALC 73 09/23/2008   TRIG 94 09/23/2008   Lab Results  Component Value Date   DDIMER (H) 10/25/2007    0.80        AT THE INHOUSE ESTABLISHED CUTOFF VALUE OF 0.48 ug/mL FEU, THIS ASSAY HAS BEEN DOCUMENTED IN THE LITERATURE TO HAVE     Radiology/Studies  Dg Chest 2 View  Result Date: 09/30/2016 CLINICAL DATA:  70 year old female with history of right-sided chest pain and pressure and nausea. EXAM: CHEST  2 VIEW COMPARISON:  Chest x-ray 01/28/2016. FINDINGS: Lung volumes are normal. No consolidative airspace disease. No pleural effusions. No pneumothorax. No pulmonary nodule or mass noted. Pulmonary vasculature and the cardiomediastinal silhouette are within normal limits. Status post median sternotomy. Surgical clips project over the right upper quadrant of the abdomen, likely from prior cholecystectomy. IMPRESSION: No radiographic evidence of acute cardiopulmonary disease.  Electronically Signed   By: Vinnie Langton M.D.   On: 09/30/2016 14:29    ECG  Chronic LBBB. Unchanged from prior.   ASSESSMENT AND PLAN  1. Atypical Chest  Pain: Recent chest pain symptoms are atypical. She is currently chest pain-free in the ED. EKG shows chronic left bundle Ranen Doolin block, unchanged from previous EKGs. Point of care troponin is negative x1. Vital signs are stable with a pressure at 104/83. Chest x-ray unremarkable. BNP within normal limits at 66. BMP and CBC also unremarkable. Would recommend checking a second troponin. If negative consider further workup as an outpatient. Can consider outpatient nuclear stress test.  2. H/O HCM: s/p septal myectomy in 2008. No notable murmur noted on exam.   3. HTN: BP has been elevated at home recently, but currently well controlled in ED at 104/83.   Signed, Lyda Jester, PA-C 09/30/2016, 3:52 PM  Attending Note  Patient seen and discussed with PA Rosita Fire, I agree with her documentation. 70 yo female with history of HOCM s/p myectomy with resolution of obstruction, history of near syncope with several year history of loop recorder that was unremarkable, presents with chest pain. From Dr Croitoru's last note in 07/2016 she reported some dyspnea, thought possibly due to some diastolic dysfunction but patient did not want to try diuretic. She also has history of mixed compliance with her beta blocker.   Presents with atypcial chest pain. Workup this far not consistent with ACS. WIll repeat troponin, if negative can d/c from ER and we will arrange outpatient nuclear stress test.  Zandra Abts MD

## 2016-10-09 ENCOUNTER — Encounter (INDEPENDENT_AMBULATORY_CARE_PROVIDER_SITE_OTHER): Payer: Self-pay

## 2016-10-09 ENCOUNTER — Ambulatory Visit (INDEPENDENT_AMBULATORY_CARE_PROVIDER_SITE_OTHER): Payer: Medicare Other | Admitting: Allergy

## 2016-10-09 ENCOUNTER — Encounter: Payer: Self-pay | Admitting: Allergy

## 2016-10-09 VITALS — BP 120/78 | HR 90 | Temp 98.2°F | Resp 18

## 2016-10-09 DIAGNOSIS — J4540 Moderate persistent asthma, uncomplicated: Secondary | ICD-10-CM | POA: Diagnosis not present

## 2016-10-09 DIAGNOSIS — B9689 Other specified bacterial agents as the cause of diseases classified elsewhere: Secondary | ICD-10-CM

## 2016-10-09 DIAGNOSIS — J019 Acute sinusitis, unspecified: Secondary | ICD-10-CM

## 2016-10-09 MED ORDER — DOXYCYCLINE HYCLATE 100 MG PO TABS: 100.0000 mg | ORAL_TABLET | Freq: Two times a day (BID) | ORAL | 0 refills | Status: AC

## 2016-10-09 MED ORDER — MOMETASONE FUROATE 200 MCG/ACT IN AERO: 2.0000 | INHALATION_SPRAY | Freq: Every day | RESPIRATORY_TRACT | 5 refills | Status: DC

## 2016-10-09 NOTE — Patient Instructions (Signed)
Take doxycycline 100 mg twice a day 7 days  Continue 'action plan' for asthma flare with Asmanex 220 two inhalations two times per day while sick.    Then resume Asmanex 220 one inhalation one time per day.   Use Mucinex (do not use the Dm)  1200mg  daily drink with plenty of water.   Continue ventolin HFA 2 puffs every 4-6 hours if needed.  Continue nasal saline and systane eye drops if needed.    Follow-up 2-3 months

## 2016-10-09 NOTE — Progress Notes (Signed)
Follow-up Note  RE: DEMI DREESE MRN: [id number] DOB: August 04, 1946 Date of Office Visit: 10/09/2016   History of present illness: Autumn Fitzgerald is a 70 y.o. female presenting today for sick visit.  She was last seen in our office in August 2017 by Dr. Neldon Mc. She is being followed in office for asthma, allergic rhinitis and reflux.   For the past week she complains of headache and eye pain as well as cough, itchy throat and worsening nasal drainage with green mucus.  She also states she has pressure in her sinus area. Symptoms are worsening.  She has continued to use her asmanex 2 inhalation twice a day.   She has ventolin for as needed.  She does not recall last time she has used her ventolin. She also reports she is waking up from sleep due to blow out her nose.   She has saline rinses at home however she has not started to use these.    She did go to the ED on October 21 for high blood pressure and chest pain for which she had an EKG showing sinus rhythm with PACs and possible left atrial enlargement.   EKG was unchanged from prior reading.  Chest x-ray done and reviewed showing no acute cardiopulmonary disease. He was discharged on the left knee and was seen and followed by her cardiologist who plans to do a  stress test coming up on the Nov 9th.      Review of systems: Review of Systems  Constitutional: Positive for chills. Negative for fever and malaise/fatigue.  HENT: Positive for congestion and sore throat.   Eyes: Negative for redness.  Respiratory: Positive for cough. Negative for sputum production, shortness of breath and wheezing.   Cardiovascular: Negative for chest pain.  Gastrointestinal: Negative for nausea and vomiting.  Skin: Negative for itching and rash.  Neurological: Positive for headaches.    All other systems negative unless noted above in HPI  Past medical/social/surgical/family history have been reviewed and are unchanged unless specifically indicated  below.  No changes  Medication List:   Medication List       Accurate as of 10/09/16  6:15 PM. Always use your most recent med list.          ASMANEX HFA 200 MCG/ACT Aero Generic drug:  Mometasone Furoate Inhale 1 puff into the lungs daily.   Mometasone Furoate 200 MCG/ACT Aero Commonly known as:  ASMANEX HFA Inhale 2 puffs into the lungs daily.   atenolol 25 MG tablet Commonly known as:  TENORMIN Take 0.5 tablets (12.5 mg total) by mouth daily as needed (for high blood pressure).   B COMPLEX-B12 PO Take 1 tablet by mouth daily.   clobetasol cream 0.05 % Commonly known as:  TEMOVATE Apply 1 application topically 2 (two) times a week.   clonazePAM 1 MG tablet Commonly known as:  KLONOPIN Take 1 mg by mouth at bedtime.   doxycycline 100 MG tablet Commonly known as:  VIBRA-TABS Take 1 tablet (100 mg total) by mouth 2 (two) times daily.   ezetimibe-simvastatin 10-40 MG tablet Commonly known as:  VYTORIN Take 0.5 tablets by mouth at bedtime.   multivitamin tablet Take 1 tablet by mouth daily.   OSTEO BI-FLEX/5-LOXIN ADVANCED PO Take 2 tablets by mouth daily.   OVER THE COUNTER MEDICATION Take 1 tablet by mouth daily. IBgard   pantoprazole 40 MG tablet Commonly known as:  PROTONIX Take 40 mg by mouth daily.   SYNTHROID 50  MCG tablet Generic drug:  levothyroxine Take 50 mcg by mouth daily.   SYSTANE BALANCE OP Apply 1 drop to eye 2 times daily at 12 noon and 4 pm.   ULTRAFLORA IMMUNE HEALTH PO Take 1 tablet by mouth daily.   VENTOLIN HFA 108 (90 Base) MCG/ACT inhaler Generic drug:  albuterol INHALE 2 PUFFS BY MOUTH EVERY 4 TO 6 HOURS AS NEEDED FOR COUGH OR WHEEZING   Vitamin D-3 5000 UNITS Tabs Take 5,000 Units by mouth daily.   XIIDRA 5 % Soln Generic drug:  Lifitegrast Place 1 drop into both eyes 2 (two) times daily.       Known medication allergies: Allergies  Allergen Reactions  . Amoxicillin-Pot Clavulanate Other (See Comments)     Severe pains and GI symptoms  . Statins Other (See Comments)    Myalgias with lipitor; tolerates vytorin  . Temazepam Other (See Comments)    Dizziness and doesn't work for patient     Physical examination: Blood pressure 120/78, pulse 90, temperature 98.2 F (36.8 C), temperature source Oral, resp. rate 18, SpO2 97 %.  General: Alert, interactive, in no acute distress. HEENT: TMs pearly gray, turbinates moderately edematous with thick Green discharge, post-pharynx non erythematous. Neck: Supple without lymphadenopathy. Lungs: Clear to auscultation without wheezing, rhonchi or rales. {no increased work of breathing. CV: Normal S1, S2 without murmurs. Abdomen: Nondistended, nontender. Skin: Warm and dry, without lesions or rashes. Extremities:  No clubbing, cyanosis or edema. Neuro:   Grossly intact.  Diagnositics/Labs:  Spirometry: FEV1: 1.21L  65%, FVC: 1.38L  58%, ratio consistent with Moderate restrictive pattern  Assessment and plan:   Acute bacterial rhinosinusitis  - Presume due to worsening symptoms 1 week with greenish discharge  - Take doxycycline 100 mg twice a day 7 days  - Advised to resume nasal saline rinses  - Use Mucinex 1200 mg daily with plenty of water  Moderate persistent asthma  -  Continue 'action plan' for asthma flare with Asmanex 220 two inhalations two times per day while sick.   Then resume Asmanex 220 one inhalation one time per day.  - Continue ventolin HFA 2 puffs every 4-6 hours if needed. - Discussed possible need for steroids however patient says she does not tolerate prednisone and did not want to be treated for adoption.   Follow-up 2-3 months   I appreciate the opportunity to take part in Inice's care. Please do not hesitate to contact me with questions.  Sincerely,   Prudy Feeler, MD Allergy/Immunology [facility]

## 2016-10-11 NOTE — Telephone Encounter (Signed)
Closed encounter °

## 2016-10-18 ENCOUNTER — Encounter (HOSPITAL_COMMUNITY): Payer: Self-pay

## 2016-10-19 ENCOUNTER — Inpatient Hospital Stay (HOSPITAL_COMMUNITY): Admission: RE | Admit: 2016-10-19 | Payer: Self-pay | Source: Ambulatory Visit

## 2016-10-25 ENCOUNTER — Telehealth (HOSPITAL_COMMUNITY): Payer: Self-pay

## 2016-10-25 NOTE — Telephone Encounter (Signed)
Encounter complete. 

## 2016-10-27 ENCOUNTER — Ambulatory Visit (HOSPITAL_COMMUNITY)
Admission: RE | Admit: 2016-10-27 | Discharge: 2016-10-27 | Disposition: A | Discharge status: Home / Self Care | Payer: Medicare Other | Source: Ambulatory Visit | Attending: Cardiology | Admitting: Cardiology

## 2016-10-27 DIAGNOSIS — R0789 Other chest pain: Secondary | ICD-10-CM | POA: Diagnosis not present

## 2016-10-27 LAB — MYOCARDIAL PERFUSION IMAGING
LV dias vol: 42 mL (ref 46–106)
LV sys vol: 11 mL
Peak HR: 122 {beats}/min
Rest HR: 76 {beats}/min
SDS: 3
SRS: 6
SSS: 9
TID: 1.51

## 2016-10-27 MED ORDER — REGADENOSON 0.4 MG/5ML IV SOLN
0.4000 mg | Freq: Once | INTRAVENOUS | Status: AC
  Administered 2016-10-27: 0.4 mg via INTRAVENOUS

## 2016-10-27 MED ORDER — AMINOPHYLLINE 25 MG/ML IV SOLN
75.0000 mg | Freq: Once | INTRAVENOUS | Status: AC
  Administered 2016-10-27: 75 mg via INTRAVENOUS

## 2016-10-27 MED ORDER — TECHNETIUM TC 99M TETROFOSMIN IV KIT
31.8000 | PACK | Freq: Once | INTRAVENOUS | Status: AC | PRN
  Administered 2016-10-27: 31.8 via INTRAVENOUS
  Filled 2016-10-27: qty 32

## 2016-10-27 MED ORDER — TECHNETIUM TC 99M TETROFOSMIN IV KIT
10.2000 | PACK | Freq: Once | INTRAVENOUS | Status: AC | PRN
  Administered 2016-10-27: 10.2 via INTRAVENOUS
  Filled 2016-10-27: qty 11

## 2016-11-06 ENCOUNTER — Encounter: Payer: Self-pay | Admitting: *Deleted

## 2016-11-11 ENCOUNTER — Other Ambulatory Visit: Payer: Self-pay | Admitting: Allergy and Immunology

## 2016-11-13 ENCOUNTER — Telehealth: Payer: Self-pay | Admitting: Cardiovascular Disease

## 2016-11-13 NOTE — Telephone Encounter (Signed)
Per result note: Low risk stress test. This is a normal study. No signs of poor blood flow.  Pt notified of result above

## 2016-11-13 NOTE — Telephone Encounter (Signed)
Autumn Fitzgerald is calling to get the results of her stress test . Please call

## 2016-11-28 ENCOUNTER — Other Ambulatory Visit: Payer: Self-pay | Admitting: Gastroenterology

## 2016-11-28 DIAGNOSIS — R1084 Generalized abdominal pain: Secondary | ICD-10-CM

## 2016-12-06 ENCOUNTER — Ambulatory Visit
Admission: RE | Admit: 2016-12-06 | Discharge: 2016-12-06 | Disposition: A | Discharge status: Home / Self Care | Payer: Medicare Other | Source: Ambulatory Visit | Attending: Gastroenterology | Admitting: Gastroenterology

## 2016-12-06 DIAGNOSIS — R1084 Generalized abdominal pain: Secondary | ICD-10-CM

## 2016-12-06 MED ORDER — IOPAMIDOL (ISOVUE-300) INJECTION 61%
100.0000 mL | Freq: Once | INTRAVENOUS | Status: AC | PRN
  Administered 2016-12-06: 100 mL via INTRAVENOUS

## 2016-12-26 ENCOUNTER — Ambulatory Visit (INDEPENDENT_AMBULATORY_CARE_PROVIDER_SITE_OTHER): Payer: Medicare Other | Admitting: Allergy and Immunology

## 2016-12-26 ENCOUNTER — Encounter: Payer: Self-pay | Admitting: Allergy and Immunology

## 2016-12-26 VITALS — BP 118/78 | HR 72 | Resp 24

## 2016-12-26 DIAGNOSIS — J4540 Moderate persistent asthma, uncomplicated: Secondary | ICD-10-CM | POA: Diagnosis not present

## 2016-12-26 DIAGNOSIS — K219 Gastro-esophageal reflux disease without esophagitis: Secondary | ICD-10-CM

## 2016-12-26 DIAGNOSIS — J3089 Other allergic rhinitis: Secondary | ICD-10-CM | POA: Diagnosis not present

## 2016-12-26 NOTE — Progress Notes (Signed)
Follow-up Note  Referring Provider: Nolene Ebbs, MD Primary Provider: Philis Fendt, MD Date of Office Visit: 12/26/2016  Subjective:   Autumn Fitzgerald (DOB: 1946-11-07) is a 71 y.o. female who returns to the [facility] [place] on 12/26/2016 in re-evaluation of the following:  HPI: Kalaina returns to this clinic in reevaluation of her asthma and allergic rhinitis and reflux. I've not seen her in this clinic since August 2017.  During the interval she has only had 1 sinus infection that has required acute treatment. Otherwise, she does very well with her airway and has no issues with wheezing and coughing and no need to use a short acting bronchodilator and has had very little issues with her upper airways. She is not the best about consistently using her inhaled steroid but it does sound as though she receives her inhaled steroid a few times per week. Rarely does she use a short acting bronchodilator.  Even though her upper respiratory tract and lower respiratory tract is doing relatively well she still has significant problems with her stomach. She is now seeing Dr. Collene Mares who performed an imaging study with a CT scan of her abdomen and apparently she has fatty liver. She's using a collection of medical therapy directed by Dr. Collene Mares for her GI issue.  She did receive the flu vaccine this year.  Allergies as of 12/26/2016      Reactions   Amoxicillin-pot Clavulanate Other (See Comments)   Severe pains and GI symptoms   Statins Other (See Comments)   Myalgias with lipitor; tolerates vytorin   Temazepam Other (See Comments)   Dizziness and doesn't work for patient      Medication List      ASMANEX HFA 200 MCG/ACT Aero Generic drug:  Mometasone Furoate Inhale 1 puff into the lungs daily.   Mometasone Furoate 200 MCG/ACT Aero Commonly known as:  ASMANEX HFA Inhale 2 puffs into the lungs daily.   atenolol 25 MG tablet Commonly known as:  TENORMIN Take 0.5 tablets  (12.5 mg total) by mouth daily as needed (for high blood pressure).   B COMPLEX-B12 PO Take 1 tablet by mouth daily.   clobetasol cream 0.05 % Commonly known as:  TEMOVATE Apply 1 application topically 2 (two) times a week.   clonazePAM 1 MG tablet Commonly known as:  KLONOPIN Take 1 mg by mouth at bedtime.   ezetimibe-simvastatin 10-40 MG tablet Commonly known as:  VYTORIN Take 0.5 tablets by mouth at bedtime.   multivitamin tablet Take 1 tablet by mouth daily.   OSTEO BI-FLEX/5-LOXIN ADVANCED PO Take 2 tablets by mouth daily.   OVER THE COUNTER MEDICATION Take 1 tablet by mouth daily. IBgard   pantoprazole 40 MG tablet Commonly known as:  PROTONIX Take 40 mg by mouth daily.   SYNTHROID 50 MCG tablet Generic drug:  levothyroxine Take 50 mcg by mouth daily.   SYSTANE BALANCE OP Apply 1 drop to eye 2 times daily at 12 noon and 4 pm.   ULTRAFLORA IMMUNE HEALTH PO Take 1 tablet by mouth daily.   VENTOLIN HFA 108 (90 Base) MCG/ACT inhaler Generic drug:  albuterol INHALE 2 PUFFS BY MOUTH EVERY 4 TO 6 HOURS AS NEEDED FOR COUGH OR WHEEZING   Vitamin D-3 5000 units Tabs Take 5,000 Units by mouth daily.   XIIDRA 5 % Soln Generic drug:  Lifitegrast Place 1 drop into both eyes 2 (two) times daily.       Past Medical History:  Diagnosis Date  . Arthritis   . Asthma   . Chest wall pain   . Depression   . GERD (gastroesophageal reflux disease)   . Head injury, closed, without LOC 02/2013   did not LOC per patient  . Headache   . HLD (hyperlipidemia)   . Hypertension    at times,  . Hyperthyroidism   . 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 Mental disorder   . Near syncope   . Palpitations   . Panic attacks     Past Surgical History:  Procedure Laterality Date  . CHOLECYSTECTOMY    . COLONOSCOPY N/A 11/24/2013   Procedure: COLONOSCOPY;  Surgeon: Juanita Craver, MD;  Location: [facility];  Service: Endoscopy;  Laterality: N/A;  . CORONARY  ARTERY BYPASS GRAFT  02/05/2007  . DOPPLER ECHOCARDIOGRAPHY  03/14/2013   EF 65-70%,trivial AI,mild-mod. MR,Mod. TR,mod. concentric hypertrophy  . EP IMPLANTABLE DEVICE N/A 08/22/2016   Procedure: Loop Recorder Removal;  Surgeon: Sanda Klein, MD;  Location: [place] [facility];  Service: Cardiovascular;  Laterality: N/A;  . ESOPHAGOGASTRODUODENOSCOPY N/A 11/24/2013   Procedure: ESOPHAGOGASTRODUODENOSCOPY (EGD);  Surgeon: Juanita Craver, MD;  [facility];  Service: Endoscopy;  Laterality: N/A;  . KNEE ARTHROSCOPY Right 10/17/2013   Procedure: ARTHROSCOPY RIGHT KNEE;  Surgeon: [name], MD;  Location: [facility];  Service: Orthopedics;  Laterality: Right;  . LOOP RECORDER IMPLANT  03/18/2013  . LOOP RECORDER IMPLANT N/A 03/18/2013   Procedure: LOOP RECORDER IMPLANT;  Surgeon: Sanda Klein, MD;  Location: [place] [facility];  Service: Cardiovascular;  Laterality: N/A;  . Nuclear Stress Test  11/30/2011   No ischemia  . Septal Myectomy    . TUBAL LIGATION      Review of systems negative except as noted in HPI / PMHx or noted below:  Review of Systems  Constitutional: Negative.   HENT: Negative.   Eyes: Negative.   Respiratory: Negative.   Cardiovascular: Negative.   Gastrointestinal: Negative.   Genitourinary: Negative.   Musculoskeletal: Negative.   Skin: Negative.   Neurological: Negative.   Endo/Heme/Allergies: Negative.   Psychiatric/Behavioral: Negative.      Objective:   Vitals:   12/26/16 1426  BP: 118/78  Pulse: 72  Resp: (!) 24          Physical Exam  Constitutional: She is well-developed, well-nourished, and in no distress.  HENT:  Head: Normocephalic.  Right Ear: Tympanic membrane, external ear and ear canal normal.  Left Ear: Tympanic membrane, external ear and ear canal normal.  Nose: Nose normal. No mucosal edema or rhinorrhea.  Mouth/Throat: Uvula is midline, oropharynx is clear and moist and mucous membranes are normal. No oropharyngeal exudate.  Eyes:  Conjunctivae are normal.  Neck: Trachea normal. No tracheal tenderness present. No tracheal deviation present. No thyromegaly present.  Cardiovascular: Normal rate, regular rhythm, S1 normal, S2 normal and normal heart sounds.   No murmur heard. Pulmonary/Chest: Breath sounds normal. No stridor. No respiratory distress. She has no wheezes. She has no rales.  Musculoskeletal: She exhibits no edema.  Lymphadenopathy:       Head (right side): No tonsillar adenopathy present.       Head (left side): No tonsillar adenopathy present.    She has no cervical adenopathy.  Neurological: She is alert. Gait normal.  Skin: No rash noted. She is not diaphoretic. No erythema. Nails show no clubbing.  Psychiatric: Mood and affect normal.    Diagnostics: none   Assessment and Plan:   1. Moderate  persistent asthma without complication   2. Other allergic rhinitis   3. Gastroesophageal reflux disease, esophagitis presence not specified      1. Continue treatment with Dr. Collene Mares for GI issue  2. Change Asmanex to Pulmicort 180 two inhalations one time per day as per insurance coverage.   3. Continue ventolin HFA 2 puffs every 4-6 hours if needed.  4. Continue nasal saline and systane eye drops if needed.  5. Continue 'action plan' for asthma flare with Pulmicort 180 three inhalations three times per day.  6. Return in 6 months or earlier if problem   Overall Gretel appears to be doing relatively well with her respiratory tract issue and although she does use her medication somewhat unconventional I am going to have her continue to use an inhaled steroid at least a few times per week. I've also given her an action plan to initiate should she develop any significant problems with an asthma flare in the future. She will continue to receive therapy regarding her GI issue with Dr. Collene Mares. I did have a rather prolonged and animated discussion with her about her fatty liver and the fact that if she wanted to do  something proactive about her fatty liver she would attempt to lose weight using a diet plan that she can continue for a prolonged period in time. I will see her back in this clinic in 6 months or earlier if there is a problem.  Allena Katz, MD [place]

## 2016-12-26 NOTE — Patient Instructions (Addendum)
  1. Continue treatment with Dr. Collene Mares for GI issue  2. Change Asmanex to Pulmicort 180 two inhalations one time per day.   3. Continue ventolin HFA 2 puffs every 4-6 hours if needed.  4. Continue nasal saline and systane eye drops if needed.  5. Continue 'action plan' for asthma flare with Pulmicort 180 three inhalations three times per day.  6. Return in 6 months or earlier if problem

## 2016-12-28 ENCOUNTER — Ambulatory Visit: Payer: Self-pay | Admitting: Obstetrics and Gynecology

## 2016-12-28 MED ORDER — BUDESONIDE 180 MCG/ACT IN AEPB: 2.0000 | INHALATION_SPRAY | Freq: Two times a day (BID) | RESPIRATORY_TRACT | 5 refills | Status: DC

## 2017-01-09 ENCOUNTER — Ambulatory Visit (INDEPENDENT_AMBULATORY_CARE_PROVIDER_SITE_OTHER): Payer: Medicare Other | Admitting: Obstetrics and Gynecology

## 2017-01-09 DIAGNOSIS — L900 Lichen sclerosus et atrophicus: Secondary | ICD-10-CM | POA: Diagnosis present

## 2017-01-09 MED ORDER — CLOBETASOL PROPIONATE 0.05 % EX OINT: TOPICAL_OINTMENT | CUTANEOUS | 5 refills | Status: DC

## 2017-01-09 NOTE — Progress Notes (Signed)
71 yo presenting today for 6 months follow up on her lichen sclerosis. Patient reports doing well with the cream. She reports some pruritis on occasion. She is not sexually active. She also denies any vaginal bleeding. She denies any abdominal/pelvic pain. She is currently evaluated by GI for abdominal bloating  Past Medical History:  Diagnosis Date  . Arthritis   . Asthma   . Chest wall pain   . Depression   . GERD (gastroesophageal reflux disease)   . Head injury, closed, without LOC 02/2013   did not LOC per patient  . Headache   . HLD (hyperlipidemia)   . Hypertension    at times,  . Hyperthyroidism   . 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 Mental disorder   . Near syncope   . Palpitations   . Panic attacks    Past Surgical History:  Procedure Laterality Date  . CHOLECYSTECTOMY    . COLONOSCOPY N/A 11/24/2013   Procedure: COLONOSCOPY;  Surgeon: Juanita Craver, MD;  [facility];  Service: Endoscopy;  Laterality: N/A;  . CORONARY ARTERY BYPASS GRAFT  02/05/2007  . DOPPLER ECHOCARDIOGRAPHY  03/14/2013   EF 65-70%,trivial AI,mild-mod. MR,Mod. TR,mod. concentric hypertrophy  . EP IMPLANTABLE DEVICE N/A 08/22/2016   Procedure: Loop Recorder Removal;  Surgeon: Sanda Klein, MD;  Location: [place] [facility];  Service: Cardiovascular;  Laterality: N/A;  . ESOPHAGOGASTRODUODENOSCOPY N/A 11/24/2013   Procedure: ESOPHAGOGASTRODUODENOSCOPY (EGD);  Surgeon: Juanita Craver, MD;  [facility];  Service: Endoscopy;  Laterality: N/A;  . KNEE ARTHROSCOPY Right 10/17/2013   Procedure: ARTHROSCOPY RIGHT KNEE;  Surgeon: [name], MD;  Location: [facility];  Service: Orthopedics;  Laterality: Right;  . LOOP RECORDER IMPLANT  03/18/2013  . LOOP RECORDER IMPLANT N/A 03/18/2013   Procedure: LOOP RECORDER IMPLANT;  Surgeon: Sanda Klein, MD;  Location: [place] [facility];  Service: Cardiovascular;  Laterality: N/A;  . Nuclear Stress Test  11/30/2011   No ischemia  . Septal  Myectomy    . TUBAL LIGATION     Family History  Problem Relation Age of Onset  . Cancer Mother     breast  . Hypertension Mother   . Cancer Father     prostate  . Diabetes Sister   . Heart attack Sister   . Migraines Neg Hx    Social History  Substance Use Topics  . Smoking status: Never Smoker  . Smokeless tobacco: Never Used  . Alcohol use No   ROS See pertinent in HPI GENERAL: Well-developed, well-nourished female in no acute distress.  ABDOMEN: Soft, nontender, distended.  PELVIC: Normal external female genitalia. Vagina is atrophic with 1 white area located on the inferior aspect of the introitus approximately 1 cm wide..  Normal discharge. Normal atrophic appearing cervix, flush with vaginal wall. No evidence of vaginal bleeding. Uterus is normal in size. No adnexal mass or tenderness. EXTREMITIES: No cyanosis, clubbing, or edema, 2+ distal pulses.  [name] 71 yo with lichen sclerosis - Stable from previous visit - continue clobetasol - Follow up in 6 months

## 2017-01-09 NOTE — Progress Notes (Signed)
Dr. Elly Modena requested that pt receives information on GI office that we refer pt's to.  LM on pt's VM information for [facility]. I also stated that  is requesting that she has her PCP or current GI provider to make the transfer request and to have medical records from current GI office to be sent for evaluation.  If she has any questions to please contact the office.

## 2017-01-15 ENCOUNTER — Other Ambulatory Visit: Payer: Self-pay | Admitting: Allergy and Immunology

## 2017-02-12 ENCOUNTER — Other Ambulatory Visit: Payer: Self-pay | Admitting: Allergy and Immunology

## 2017-04-23 ENCOUNTER — Other Ambulatory Visit: Payer: Self-pay | Admitting: Allergy and Immunology

## 2017-04-23 NOTE — Telephone Encounter (Signed)
RF for Ventolin HFA x 1 at [facility]

## 2017-05-25 ENCOUNTER — Other Ambulatory Visit: Payer: Self-pay | Admitting: Internal Medicine

## 2017-05-25 DIAGNOSIS — Z1231 Encounter for screening mammogram for malignant neoplasm of breast: Secondary | ICD-10-CM

## 2017-05-29 ENCOUNTER — Ambulatory Visit (INDEPENDENT_AMBULATORY_CARE_PROVIDER_SITE_OTHER): Payer: Medicare Other | Admitting: Obstetrics and Gynecology

## 2017-05-29 ENCOUNTER — Encounter: Payer: Self-pay | Admitting: Obstetrics and Gynecology

## 2017-05-29 VITALS — BP 115/73 | HR 85 | Ht 60.0 in | Wt 140.5 lb

## 2017-05-29 DIAGNOSIS — L900 Lichen sclerosus et atrophicus: Secondary | ICD-10-CM | POA: Diagnosis present

## 2017-05-29 MED ORDER — CLOBETASOL PROPIONATE 0.05 % EX OINT: TOPICAL_OINTMENT | CUTANEOUS | 5 refills | Status: DC

## 2017-05-29 NOTE — Progress Notes (Signed)
[name] here for follow up on lichen sclerosis. Patient reports doing well with minimal vulva pruritis. She denies any vaginal bleeding and she is not sexually active. She denies any urinary incontinence, pelvic pain or abnormal discharge  Past Medical History:  Diagnosis Date  . Arthritis   . Asthma   . Chest wall pain   . Depression   . GERD (gastroesophageal reflux disease)   . Head injury, closed, without LOC 02/2013   did not LOC per patient  . Headache   . HLD (hyperlipidemia)   . Hypertension    at times,  . Hyperthyroidism   . 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 Mental disorder   . Near syncope   . Palpitations   . Panic attacks    Past Surgical History:  Procedure Laterality Date  . CHOLECYSTECTOMY    . COLONOSCOPY N/A 11/24/2013   Procedure: COLONOSCOPY;  Surgeon: Juanita Craver, MD;  [facility];  Service: Endoscopy;  Laterality: N/A;  . CORONARY ARTERY BYPASS GRAFT  02/05/2007  . DOPPLER ECHOCARDIOGRAPHY  03/14/2013   EF 65-70%,trivial AI,mild-mod. MR,Mod. TR,mod. concentric hypertrophy  . EP IMPLANTABLE DEVICE N/A 08/22/2016   Procedure: Loop Recorder Removal;  Surgeon: Sanda Klein, MD;  Location: [place] [facility];  Service: Cardiovascular;  Laterality: N/A;  . ESOPHAGOGASTRODUODENOSCOPY N/A 11/24/2013   Procedure: ESOPHAGOGASTRODUODENOSCOPY (EGD);  Surgeon: Juanita Craver, MD;  [facility];  Service: Endoscopy;  Laterality: N/A;  . KNEE ARTHROSCOPY Right 10/17/2013   Procedure: ARTHROSCOPY RIGHT KNEE;  Surgeon: [name], MD;  Location: [place];  Service: Orthopedics;  Laterality: Right;  . LOOP RECORDER IMPLANT  03/18/2013  . LOOP RECORDER IMPLANT N/A 03/18/2013   Procedure: LOOP RECORDER IMPLANT;  Surgeon: Sanda Klein, MD;  Location: [place] [facility];  Service: Cardiovascular;  Laterality: N/A;  . Nuclear Stress Test  11/30/2011   No ischemia  . Septal Myectomy    . TUBAL LIGATION     Family History  Problem Relation Age  of Onset  . Cancer Mother        breast  . Hypertension Mother   . Cancer Father        prostate  . Diabetes Sister   . Heart attack Sister   . Migraines Neg Hx    Social History  Substance Use Topics  . Smoking status: Never Smoker  . Smokeless tobacco: Never Used  . Alcohol use No   ROS See pertinent in HPI Blood pressure 115/73, pulse 85, height 5' (1.524 m), weight 140 lb 8 oz (63.7 kg). GENERAL: Well-developed, well-nourished female in no acute distress.  LUNGS: Clear to auscultation bilaterally.  HEART: Regular rate and rhythm. BREASTS: Symmetric in size. No palpable masses or lymphadenopathy, skin changes, or nipple drainage. ABDOMEN: Soft, nontender, nondistended.  PELVIC: Normal external female genitalia. Vagina is pink and rugated.  Normal discharge. Normal appearing cervix. Uterus is normal in size.  No adnexal mass or tenderness. 1 cm white area well demarcated area localized at 6 o'clock position at the introitus, unchanged from 6 months ago EXTREMITIES: No cyanosis, clubbing, or edema, 2+ distal pulses.  [name] 71 yo with lichen sclerosis - Continue clobetasol cream - Screening mammogram scheduled in July - RTC in 6 months or prn

## 2017-06-05 ENCOUNTER — Encounter: Payer: Self-pay | Admitting: Allergy and Immunology

## 2017-06-05 ENCOUNTER — Ambulatory Visit (INDEPENDENT_AMBULATORY_CARE_PROVIDER_SITE_OTHER): Payer: Medicare Other | Admitting: Allergy and Immunology

## 2017-06-05 VITALS — BP 122/72 | HR 82 | Resp 20 | Ht 60.0 in

## 2017-06-05 DIAGNOSIS — J3089 Other allergic rhinitis: Secondary | ICD-10-CM | POA: Diagnosis not present

## 2017-06-05 DIAGNOSIS — J4530 Mild persistent asthma, uncomplicated: Secondary | ICD-10-CM | POA: Diagnosis not present

## 2017-06-05 DIAGNOSIS — K219 Gastro-esophageal reflux disease without esophagitis: Secondary | ICD-10-CM | POA: Diagnosis not present

## 2017-06-05 NOTE — Patient Instructions (Signed)
  1. Suggest consistently using Pulmicort 180 two inhalations one time per day.   2. Continue ventolin HFA 2 puffs every 4-6 hours if needed.  3. Continue nasal saline and systane eye drops if needed.  4. Continue 'action plan' for asthma flare with Pulmicort 180 three inhalations three times per day.  5. Return in 6 months or earlier if problem   6. Obtain fall flu vaccine

## 2017-06-05 NOTE — Progress Notes (Signed)
Follow-up Note  Referring Provider: Nolene Ebbs, MD Primary Provider: Nolene Ebbs, MD Date of Office Visit: 06/05/2017  Subjective:   Autumn Fitzgerald (DOB: December 19, 1945) is a 71 y.o. female who returns to the [facility] [place] on 06/05/2017 in re-evaluation of the following:  HPI: Autumn Fitzgerald returns to this clinic in reevaluation of her asthma and allergic rhinitis and history of reflux. I have not seen her in this clinic since January 2018.  As is usually the case she is doing very well and has tapered off all her medications when she starts feeling well. She has stopped her Pulmicort. She has not been on this medication for maybe 4 months or so. She has not required an antibiotic or steroid to treat any type of respiratory tract issue. She rarely uses a short acting bronchodilator. She uses an exercise bike every morning.  She has had no problems with her nose. Headaches have not been a particularly big issue.  She is now going to see the GI department at [facility] concerning her recurrent abdominal pain. It does not sound as though she is using any therapy for reflux at this point.  Allergies as of 06/05/2017      Reactions   Amoxicillin-pot Clavulanate Other (See Comments)   Severe pains and GI symptoms   Statins Other (See Comments)   Myalgias with lipitor; tolerates vytorin   Temazepam Other (See Comments)   Dizziness and doesn't work for patient      Medication List      atenolol 25 MG tablet Commonly known as:  TENORMIN Take 0.5 tablets (12.5 mg total) by mouth daily as needed (for high blood pressure).   B COMPLEX-B12 PO Take 1 tablet by mouth daily.   budesonide 180 MCG/ACT inhaler Commonly known as:  PULMICORT FLEXHALER Inhale 2 puffs into the lungs 2 (two) times daily.   CARTIA XT 120 MG 24 hr capsule Generic drug:  diltiazem TK 1 C PO HS   clobetasol ointment 0.05 % Commonly known as:  TEMOVATE Apply to affected area every night for 4  weeks, then every other day for 4 weeks and then twice a week for 4 weeks or until resolution.   clonazePAM 1 MG tablet Commonly known as:  KLONOPIN Take 1 mg by mouth at bedtime.   ezetimibe-simvastatin 10-40 MG tablet Commonly known as:  VYTORIN Take 0.5 tablets by mouth at bedtime.   Mometasone Furoate 200 MCG/ACT Aero Commonly known as:  ASMANEX HFA Inhale 2 puffs into the lungs daily.   multivitamin tablet Take 1 tablet by mouth daily.   OSTEO BI-FLEX/5-LOXIN ADVANCED PO Take 2 tablets by mouth daily.   SHINGRIX injection Generic drug:  Zoster Vac Recomb Adjuvanted UTD   SYNTHROID 50 MCG tablet Generic drug:  levothyroxine Take 50 mcg by mouth daily.   SYSTANE BALANCE OP Apply 1 drop to eye 2 times daily at 12 noon and 4 pm.   VENTOLIN HFA 108 (90 Base) MCG/ACT inhaler Generic drug:  albuterol INHALE 2 PUFFS BY MOUTH EVERY 4 TO 6 HOURS AS NEEDED FOR COUGH OR WHEEZING   XIIDRA 5 % Soln Generic drug:  Lifitegrast Place 1 drop into both eyes 2 (two) times daily.       Past Medical History:  Diagnosis Date  . Arthritis   . Asthma   . Chest wall pain   . Depression   . GERD (gastroesophageal reflux disease)   . Head injury, closed, without LOC 02/2013  did not LOC per patient  . Headache   . HLD (hyperlipidemia)   . Hypertension    at times,  . Hyperthyroidism   . 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 Mental disorder   . Near syncope   . Palpitations   . Panic attacks     Past Surgical History:  Procedure Laterality Date  . CHOLECYSTECTOMY    . COLONOSCOPY N/A 11/24/2013   Procedure: COLONOSCOPY;  Surgeon: Juanita Craver, MD;  [facility];  Service: Endoscopy;  Laterality: N/A;  . CORONARY ARTERY BYPASS GRAFT  02/05/2007  . DOPPLER ECHOCARDIOGRAPHY  03/14/2013   EF 65-70%,trivial AI,mild-mod. MR,Mod. TR,mod. concentric hypertrophy  . EP IMPLANTABLE DEVICE N/A 08/22/2016   Procedure: Loop Recorder Removal;  Surgeon: Sanda Klein, MD;  Location: [place] [facility];  Service: Cardiovascular;  Laterality: N/A;  . ESOPHAGOGASTRODUODENOSCOPY N/A 11/24/2013   Procedure: ESOPHAGOGASTRODUODENOSCOPY (EGD);  Surgeon: Juanita Craver, MD;  [facility];  Service: Endoscopy;  Laterality: N/A;  . KNEE ARTHROSCOPY Right 10/17/2013   Procedure: ARTHROSCOPY RIGHT KNEE;  Surgeon: [name], MD;  Location: [place];  Service: Orthopedics;  Laterality: Right;  . LOOP RECORDER IMPLANT  03/18/2013  . LOOP RECORDER IMPLANT N/A 03/18/2013   Procedure: LOOP RECORDER IMPLANT;  Surgeon: Sanda Klein, MD;  Location: [place] [facility];  Service: Cardiovascular;  Laterality: N/A;  . Nuclear Stress Test  11/30/2011   No ischemia  . Septal Myectomy    . TUBAL LIGATION      Review of systems negative except as noted in HPI / PMHx or noted below:  Review of Systems  Constitutional: Negative.   HENT: Negative.   Eyes: Negative.   Respiratory: Negative.   Cardiovascular: Negative.   Gastrointestinal: Negative.   Genitourinary: Negative.   Musculoskeletal: Negative.   Skin: Negative.   Neurological: Negative.   Endo/Heme/Allergies: Negative.   Psychiatric/Behavioral: Negative.      Objective:   Vitals:   06/05/17 1432  BP: 122/72  Pulse: 82  Resp: 20   Height: 5' (152.4 cm)      Physical Exam  Constitutional: She is well-developed, well-nourished, and in no distress.  HENT:  Head: Normocephalic.  Right Ear: Tympanic membrane, external ear and ear canal normal.  Left Ear: Tympanic membrane, external ear and ear canal normal.  Nose: Nose normal. No mucosal edema or rhinorrhea.  Mouth/Throat: Uvula is midline, oropharynx is clear and moist and mucous membranes are normal. No oropharyngeal exudate.  Eyes: Conjunctivae are normal.  Neck: Trachea normal. No tracheal tenderness present. No tracheal deviation present. No thyromegaly present.  Cardiovascular: Normal rate, regular rhythm, S1 normal, S2 normal and normal  heart sounds.   No murmur heard. Pulmonary/Chest: Breath sounds normal. No stridor. No respiratory distress. She has no wheezes. She has no rales.  Musculoskeletal: She exhibits no edema.  Lymphadenopathy:       Head (right side): No tonsillar adenopathy present.       Head (left side): No tonsillar adenopathy present.    She has no cervical adenopathy.  Neurological: She is alert. Gait normal.  Skin: No rash noted. She is not diaphoretic. No erythema. Nails show no clubbing.  Psychiatric: Mood and affect normal.    Diagnostics:    Spirometry was performed and demonstrated an FEV1 of 1.30 at 70 % of predicted.  The patient had an Asthma Control Test with the following results: ACT Total Score: 23.    Assessment and Plan:   1. Asthma, well controlled, mild  persistent   2. Other allergic rhinitis   3. Gastroesophageal reflux disease, esophagitis presence not specified      1. Suggest consistently using Pulmicort 180 two inhalations one time per day.   2. Continue ventolin HFA 2 puffs every 4-6 hours if needed.  3. Continue nasal saline and systane eye drops if needed.  4. Continue 'action plan' for asthma flare with Pulmicort 180 three inhalations three times per day.  5. Return in 6 months or earlier if problem   6. Obtain fall flu vaccine  Overall Darely appears to be doing relatively well. It is almost impossible to get her to use medications in a preventative manner on a consistent basis. We will rely on the use of an action plan at this point in time if she ever does develop a significant asthma flare as she moves forward. I have encouraged her to obtain the flu vaccine this fall. I will see her back in this clinic in 6 months or earlier if there is a problem.  Allena Katz, MD Allergy / Immunology [place]

## 2017-06-24 ENCOUNTER — Other Ambulatory Visit: Payer: Self-pay | Admitting: Allergy and Immunology

## 2017-07-02 ENCOUNTER — Ambulatory Visit
Admission: RE | Admit: 2017-07-02 | Discharge: 2017-07-02 | Disposition: A | Discharge status: Home / Self Care | Payer: Medicare Other | Source: Ambulatory Visit | Attending: Internal Medicine | Admitting: Internal Medicine

## 2017-07-02 DIAGNOSIS — Z1231 Encounter for screening mammogram for malignant neoplasm of breast: Secondary | ICD-10-CM

## 2017-07-24 ENCOUNTER — Other Ambulatory Visit: Payer: Self-pay | Admitting: Allergy and Immunology

## 2017-09-18 ENCOUNTER — Other Ambulatory Visit: Payer: Self-pay | Admitting: Allergy and Immunology

## 2017-10-09 ENCOUNTER — Ambulatory Visit (INDEPENDENT_AMBULATORY_CARE_PROVIDER_SITE_OTHER): Payer: Medicare Other | Admitting: Allergy and Immunology

## 2017-10-09 ENCOUNTER — Encounter: Payer: Self-pay | Admitting: Allergy and Immunology

## 2017-10-09 VITALS — BP 120/72 | HR 93 | Temp 97.9°F | Resp 16

## 2017-10-09 DIAGNOSIS — J3089 Other allergic rhinitis: Secondary | ICD-10-CM

## 2017-10-09 DIAGNOSIS — B9789 Other viral agents as the cause of diseases classified elsewhere: Secondary | ICD-10-CM | POA: Diagnosis not present

## 2017-10-09 DIAGNOSIS — J069 Acute upper respiratory infection, unspecified: Secondary | ICD-10-CM | POA: Insufficient documentation

## 2017-10-09 DIAGNOSIS — J4530 Mild persistent asthma, uncomplicated: Secondary | ICD-10-CM | POA: Diagnosis not present

## 2017-10-09 MED ORDER — FLUTICASONE PROPIONATE 50 MCG/ACT NA SUSP: NASAL | 5 refills | Status: DC

## 2017-10-09 MED ORDER — MOMETASONE FUROATE 200 MCG/ACT IN AERO: 2.0000 | INHALATION_SPRAY | Freq: Two times a day (BID) | RESPIRATORY_TRACT | 5 refills | Status: DC

## 2017-10-09 NOTE — Assessment & Plan Note (Signed)
   Samples and a prescription has been provided for Asmanex 200 g, 2 inhalations twice daily.  If necessary, a prior authorization will be submitted.  To maximize pulmonary deposition, a spacer has been provided along with instructions for its proper administration with an HFA inhaler.  Continue albuterol HFA, 1-2 inhalations every 4-6 hours if needed.  Subjective and objective measures of pulmonary function will be followed and the treatment plan will be adjusted accordingly.

## 2017-10-09 NOTE — Assessment & Plan Note (Signed)
   Continue appropriate allergen avoidance measures.  A prescription has been provided for fluticasone nasal spray, one spray per nostril 1-2 times daily as needed. Proper nasal spray technique has been discussed and demonstrated.  Nasal saline lavage (NeilMed) has been recommended as needed and prior to medicated nasal sprays along with instructions for proper administration.  For thick post nasal drainage, add guaifenesin 600 mg (Mucinex)  twice daily as needed with adequate hydration as discussed.

## 2017-10-09 NOTE — Assessment & Plan Note (Signed)
   Supportive treatment.    Treatment plan as outlined above for asthma and allergic rhinitis.

## 2017-10-09 NOTE — Progress Notes (Signed)
Follow-up Note  RE: Autumn Fitzgerald MRN: [id number] DOB: Aug 19, 1946 Date of Office Visit: 10/09/2017  Primary care provider: Nolene Ebbs, MD Referring provider: Nolene Ebbs, MD  History of present illness: Autumn Fitzgerald is a 71 y.o. female with persistent asthma, allergic rhinitis, and gastroesophageal reflux presenting today for sick visit.  She reports that she was visiting with her daughter-in-law, who had a viral upper respiratory tract infection, approximately 1 week ago.  Three days ago, the patient woke up in the morning "feeling terrible" with sinus pressure in her forehead, sneezing, increased coughing, and rhinorrhea.  She denies fevers, chills, and discolored mucus production.  She reports she has had a persistent cough over the past 4 months. She experiences rhinorrhea year around.  She has not been taking Asmanex or any other asthma controller medication because she believes that her insurance does not cover it.    Assessment and plan: Mild persistent asthma  Samples and a prescription has been provided for Asmanex 200 g, 2 inhalations twice daily.  If necessary, a prior authorization will be submitted.  To maximize pulmonary deposition, a spacer has been provided along with instructions for its proper administration with an HFA inhaler.  Continue albuterol HFA, 1-2 inhalations every 4-6 hours if needed.  Subjective and objective measures of pulmonary function will be followed and the treatment plan will be adjusted accordingly.  Allergic rhinitis  Continue appropriate allergen avoidance measures.  A prescription has been provided for fluticasone nasal spray, one spray per nostril 1-2 times daily as needed. Proper nasal spray technique has been discussed and demonstrated.  Nasal saline lavage (NeilMed) has been recommended as needed and prior to medicated nasal sprays along with instructions for proper administration.  For thick post nasal drainage, add  guaifenesin 600 mg (Mucinex)  twice daily as needed with adequate hydration as discussed.  Viral URI with cough  Supportive treatment.    Treatment plan as outlined above for asthma and allergic rhinitis.   Meds ordered this encounter  Medications  . Mometasone Furoate (ASMANEX HFA) 200 MCG/ACT AERO    Sig: Inhale 2 puffs into the lungs 2 (two) times daily. With Spacer.    Dispense:  1 Inhaler    Refill:  5  . fluticasone (FLONASE) 50 MCG/ACT nasal spray    Sig: 1 spray in each nostril 1-2 times daily    Dispense:  16 g    Refill:  5    Diagnostics: Spirometry reveals an FVC of 1.51 L and an FEV1 of 1.36 L (76% predicted) without post bronchodilator improvement.  Please see scanned spirometry results for details.    Physical examination: Blood pressure 120/72, pulse 93, temperature 97.9 F (36.6 C), temperature source Oral, resp. rate 16, SpO2 96 %.  General: Alert, interactive, in no acute distress. HEENT: TMs pearly gray, turbinates moderately edematous with thick discharge, post-pharynx moderately erythematous. Neck: Supple without lymphadenopathy. Lungs: Mildly decreased breath sounds bilaterally without wheezing, rhonchi or rales. CV: Normal S1, S2 without murmurs. Skin: Warm and dry, without lesions or rashes.  The following portions of the patient's history were reviewed and updated as appropriate: allergies, current medications, past family history, past medical history, past social history, past surgical history and problem list.  Allergies as of 10/09/2017      Reactions   Amoxicillin-pot Clavulanate Other (See Comments)   Severe pains and GI symptoms   Statins Other (See Comments)   Myalgias with lipitor; tolerates vytorin   Temazepam Other (See Comments)  Dizziness and doesn't work for patient      Medication List       Accurate as of 10/09/17  6:12 PM. Always use your most recent med list.          acetaminophen 325 MG tablet Commonly known as:   TYLENOL Take 650 mg by mouth.   atenolol 25 MG tablet Commonly known as:  TENORMIN Take 0.5 tablets (12.5 mg total) by mouth daily as needed (for high blood pressure).   B COMPLEX-B12 PO Take 1 tablet by mouth daily.   budesonide 180 MCG/ACT inhaler Commonly known as:  PULMICORT FLEXHALER Inhale 2 puffs into the lungs 2 (two) times daily.   CARTIA XT 120 MG 24 hr capsule Generic drug:  diltiazem TK 1 C PO HS   clobetasol ointment 0.05 % Commonly known as:  TEMOVATE Apply to affected area every night for 4 weeks, then every other day for 4 weeks and then twice a week for 4 weeks or until resolution.   clonazePAM 1 MG tablet Commonly known as:  KLONOPIN Take 1 mg by mouth at bedtime.   ezetimibe-simvastatin 10-40 MG tablet Commonly known as:  VYTORIN Take 0.5 tablets by mouth at bedtime.   fluticasone 50 MCG/ACT nasal spray Commonly known as:  FLONASE 1 spray in each nostril 1-2 times daily   Mometasone Furoate 200 MCG/ACT Aero Commonly known as:  ASMANEX HFA Inhale 2 puffs into the lungs 2 (two) times daily. With Spacer.   multivitamin tablet Take 1 tablet by mouth daily.   OSTEO BI-FLEX/5-LOXIN ADVANCED PO Take 2 tablets by mouth daily.   SHINGRIX injection Generic drug:  Zoster Vaccine Adjuvanted UTD   SYNTHROID 50 MCG tablet Generic drug:  levothyroxine Take 50 mcg by mouth daily.   SYSTANE BALANCE OP Apply 1 drop to eye 2 times daily at 12 noon and 4 pm.   VENTOLIN HFA 108 (90 Base) MCG/ACT inhaler Generic drug:  albuterol INHALE 2 PUFFS BY MOUTH EVERY 4 TO 6 HOURS AS NEEDED FOR COUGH OR WHEEZING   XIIDRA 5 % Soln Generic drug:  Lifitegrast Place 1 drop into both eyes 2 (two) times daily.       Allergies  Allergen Reactions  . Amoxicillin-Pot Clavulanate Other (See Comments)    Severe pains and GI symptoms  . Statins Other (See Comments)    Myalgias with lipitor; tolerates vytorin  . Temazepam Other (See Comments)    Dizziness and doesn't  work for patient   Review of systems: Review of systems negative except as noted in HPI / PMHx or noted below: Constitutional: Negative.  HENT: Negative.   Eyes: Negative.  Respiratory: Negative.   Cardiovascular: Negative.  Gastrointestinal: Negative.  Genitourinary: Negative.  Musculoskeletal: Negative.  Neurological: Negative.  Endo/Heme/Allergies: Negative.  Cutaneous: Negative.  Past Medical History:  Diagnosis Date  . Arthritis   . Asthma   . Chest wall pain   . Depression   . GERD (gastroesophageal reflux disease)   . Head injury, closed, without LOC 02/2013   did not LOC per patient  . Headache   . HLD (hyperlipidemia)   . Hypertension    at times,  . Hyperthyroidism   . 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 Mental disorder   . Near syncope   . Palpitations   . Panic attacks     Family History  Problem Relation Age of Onset  . Cancer Mother        breast  . Hypertension Mother   .  Cancer Father        prostate  . Diabetes Sister   . Heart attack Sister   . Migraines Neg Hx     Social History   Social History  . Marital status: Divorced    Spouse name: N/A  . Number of children: 5  . Years of education: 10   Occupational History  . Not on file.   Social History Main Topics  . Smoking status: Never Smoker  . Smokeless tobacco: Never Used  . Alcohol use No  . Drug use: No  . Sexual activity: No     Comment: divorced   Other Topics Concern  . Not on file   Social History Narrative   Lives at home by herself.    Caffeine use: none     I appreciate the opportunity to take part in Uzma's care. Please do not hesitate to contact me with questions.  Sincerely,   R. Edgar Frisk, MD

## 2017-10-09 NOTE — Patient Instructions (Addendum)
Mild persistent asthma  Samples and a prescription has been provided for Asmanex 200 g, 2 inhalations twice daily.  If necessary, a prior authorization will be submitted.  To maximize pulmonary deposition, a spacer has been provided along with instructions for its proper administration with an HFA inhaler.  Continue albuterol HFA, 1-2 inhalations every 4-6 hours if needed.  Subjective and objective measures of pulmonary function will be followed and the treatment plan will be adjusted accordingly.  Allergic rhinitis  Continue appropriate allergen avoidance measures.  A prescription has been provided for fluticasone nasal spray, one spray per nostril 1-2 times daily as needed. Proper nasal spray technique has been discussed and demonstrated.  Nasal saline lavage (NeilMed) has been recommended as needed and prior to medicated nasal sprays along with instructions for proper administration.  For thick post nasal drainage, add guaifenesin 600 mg (Mucinex)  twice daily as needed with adequate hydration as discussed.  Viral URI with cough  Supportive treatment.    Treatment plan as outlined above for asthma and allergic rhinitis.   Return in about 5 months (around 03/09/2018), or if symptoms worsen or fail to improve.

## 2017-10-11 ENCOUNTER — Telehealth: Payer: Self-pay | Admitting: *Deleted

## 2017-10-11 MED ORDER — FLUTICASONE PROPIONATE HFA 220 MCG/ACT IN AERO: 2.0000 | INHALATION_SPRAY | Freq: Two times a day (BID) | RESPIRATORY_TRACT | 5 refills | Status: DC

## 2017-10-11 NOTE — Telephone Encounter (Signed)
Flovent HFA 220 g, 2 inhalations via spacer device twice daily.  Thank you for including the alternative options.

## 2017-10-11 NOTE — Telephone Encounter (Signed)
Spoke with pt and informed her of change. She will finish up her sample of asmanex that was provided in clinic and then start the flovent.

## 2017-10-11 NOTE — Telephone Encounter (Signed)
Pt insurance does not cover Asmanex HFA 200MCG. Preferred alternatives are Flovent HFA, Pulmicort, Arnuity elp, Flovent Diskus. Please advise.

## 2017-10-24 ENCOUNTER — Other Ambulatory Visit: Payer: Self-pay | Admitting: Allergy and Immunology

## 2017-11-27 ENCOUNTER — Encounter: Payer: Self-pay | Admitting: Allergy and Immunology

## 2017-11-27 ENCOUNTER — Ambulatory Visit (INDEPENDENT_AMBULATORY_CARE_PROVIDER_SITE_OTHER): Payer: Medicare Other | Admitting: Allergy and Immunology

## 2017-11-27 VITALS — BP 110/70 | HR 76 | Resp 20

## 2017-11-27 DIAGNOSIS — J3089 Other allergic rhinitis: Secondary | ICD-10-CM | POA: Diagnosis not present

## 2017-11-27 DIAGNOSIS — J4530 Mild persistent asthma, uncomplicated: Secondary | ICD-10-CM | POA: Diagnosis not present

## 2017-11-27 NOTE — Progress Notes (Signed)
Follow-up Note  Referring Provider: Nolene Ebbs, MD Primary Provider: Nolene Ebbs, MD Date of Office Visit: 11/27/2017  Subjective:   Autumn Fitzgerald (DOB: Jul 25, 1946) is a 71 y.o. female who returns to [facility] on 11/27/2017 in re-evaluation of the following:  HPI: Linnell presents to this clinic in reevaluation of her asthma and rhinitis.  I have not seen her in this clinic since June 2018 but she did visit with Dr. Verlin Fester on 09 October 2017 at which point in time she appeared to have a viral induced flare of her asthma for which she was instructed to consistently use Asmanex.  When she did better with that viral respiratory tract infection she stopped her Asmanex.  Overall she feels as though she is doing very well with her airway.  She rarely uses any short acting bronchodilator.  She has not required a systemic steroid to treat an exacerbation.  Overall her nose appears to be doing relatively well with intermittent use of nasal fluticasone.  She has not required an antibiotic to treat an episode of sinusitis.  She had an upper endoscopy performed on 05 November 2017 which apparently identified "stomach polyps".  She has been given a prescription for Dexilant but she is not use this medication to date.  She had cataract removal in August 2018 and November 2018.  He did receive the flu vaccine this year.  Allergies as of 11/27/2017      Reactions   Amoxicillin-pot Clavulanate Other (See Comments)   Severe pains and GI symptoms   Statins Other (See Comments)   Myalgias with lipitor; tolerates vytorin   Temazepam Other (See Comments)   Dizziness and doesn't work for patient      Medication List               acetaminophen 325 MG tablet Commonly known as:  TYLENOL Take 650 mg by mouth.   atenolol 25 MG tablet Commonly known as:  TENORMIN Take 0.5 tablets (12.5 mg total) by mouth daily as needed (for high blood pressure).   B COMPLEX-B12  PO Take 1 tablet by mouth daily.   CARTIA XT 120 MG 24 hr capsule Generic drug:  diltiazem TK 1 C PO HS   clobetasol ointment 0.05 % Commonly known as:  TEMOVATE Apply to affected area every night for 4 weeks, then every other day for 4 weeks and then twice a week for 4 weeks or until resolution.   clonazePAM 1 MG tablet Commonly known as:  KLONOPIN Take 1 mg by mouth at bedtime.   ezetimibe-simvastatin 10-40 MG tablet Commonly known as:  VYTORIN Take 0.5 tablets by mouth at bedtime.   fluticasone 220 MCG/ACT inhaler Commonly known as:  FLOVENT HFA Inhale 2 puffs into the lungs 2 (two) times daily.   fluticasone 50 MCG/ACT nasal spray Commonly known as:  FLONASE 1 spray in each nostril 1-2 times daily   Mometasone Furoate 200 MCG/ACT Aero Commonly known as:  ASMANEX HFA Inhale 2 puffs into the lungs 2 (two) times daily. With Spacer.   multivitamin tablet Take 1 tablet by mouth daily.   OSTEO BI-FLEX/5-LOXIN ADVANCED PO Take 2 tablets by mouth daily.   SYNTHROID 50 MCG tablet Generic drug:  levothyroxine Take 50 mcg by mouth daily.   SYSTANE BALANCE OP Apply 1 drop to eye 2 times daily at 12 noon and 4 pm.   VENTOLIN HFA 108 (90 Base) MCG/ACT inhaler Generic drug:  albuterol INHALE 2  PUFFS BY MOUTH EVERY 4 TO 6 HOURS AS NEEDED FOR COUGH OR WHEEZING   XIIDRA 5 % Soln Generic drug:  Lifitegrast Place 1 drop into both eyes 2 (two) times daily.       Past Medical History:  Diagnosis Date  . Arthritis   . Asthma   . Chest wall pain   . Depression   . GERD (gastroesophageal reflux disease)   . Head injury, closed, without LOC 02/2013   did not LOC per patient  . Headache   . HLD (hyperlipidemia)   . Hypertension    at times,  . Hyperthyroidism   . 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 Mental disorder   . Near syncope   . Palpitations   . Panic attacks     Past Surgical History:  Procedure Laterality Date  . CHOLECYSTECTOMY    .  COLONOSCOPY N/A 11/24/2013   Procedure: COLONOSCOPY;  Surgeon: Juanita Craver, MD;  Location: [facility];  Service: Endoscopy;  Laterality: N/A;  . CORONARY ARTERY BYPASS GRAFT  02/05/2007  . DOPPLER ECHOCARDIOGRAPHY  03/14/2013   EF 65-70%,trivial AI,mild-mod. MR,Mod. TR,mod. concentric hypertrophy  . EP IMPLANTABLE DEVICE N/A 08/22/2016   Procedure: Loop Recorder Removal;  Surgeon: Sanda Klein, MD;  Location: [facility];  Service: Cardiovascular;  Laterality: N/A;  . ESOPHAGOGASTRODUODENOSCOPY N/A 11/24/2013   Procedure: ESOPHAGOGASTRODUODENOSCOPY (EGD);  Surgeon: Juanita Craver, MD;  Location: [facility];  Service: Endoscopy;  Laterality: N/A;  . KNEE ARTHROSCOPY Right 10/17/2013   Procedure: ARTHROSCOPY RIGHT KNEE;  Surgeon: [name], MD;  Location: [facility];  Service: Orthopedics;  Laterality: Right;  . LOOP RECORDER IMPLANT  03/18/2013  . LOOP RECORDER IMPLANT N/A 03/18/2013   Procedure: LOOP RECORDER IMPLANT;  Surgeon: Sanda Klein, MD;  Location: [facility];  Service: Cardiovascular;  Laterality: N/A;  . Nuclear Stress Test  11/30/2011   No ischemia  . Septal Myectomy    . TUBAL LIGATION      Review of systems negative except as noted in HPI / PMHx or noted below:  Review of Systems  Constitutional: Negative.   HENT: Negative.   Eyes: Negative.   Respiratory: Negative.   Cardiovascular: Negative.   Gastrointestinal: Negative.   Genitourinary: Negative.   Musculoskeletal: Negative.   Skin: Negative.   Neurological: Negative.   Endo/Heme/Allergies: Negative.   Psychiatric/Behavioral: Negative.      Objective:   Vitals:   11/27/17 1336  BP: 110/70  Pulse: 76  Resp: 20          Physical Exam  Constitutional: She is well-developed, well-nourished, and in no distress.  HENT:  Head: Normocephalic.  Right Ear: Tympanic membrane, external ear and ear canal normal.  Left Ear: Tympanic membrane, external ear and ear canal normal.  Nose: Nose normal. No  mucosal edema or rhinorrhea.  Mouth/Throat: Uvula is midline, oropharynx is clear and moist and mucous membranes are normal. No oropharyngeal exudate.  Eyes: Conjunctivae are normal.  Neck: Trachea normal. No tracheal tenderness present. No tracheal deviation present. No thyromegaly present.  Cardiovascular: Normal rate, regular rhythm, S1 normal, S2 normal and normal heart sounds.  No murmur heard. Pulmonary/Chest: Breath sounds normal. No stridor. No respiratory distress. She has no wheezes. She has no rales.  Musculoskeletal: She exhibits no edema.  Lymphadenopathy:       Head (right side): No tonsillar adenopathy present.       Head (left side): No tonsillar adenopathy present.    She has no cervical adenopathy.  Neurological: She is alert. Gait normal.  Skin: No rash noted. She is not diaphoretic. No erythema. Nails show no clubbing.  Psychiatric: Mood and affect normal.    Diagnostics:    Spirometry was performed and demonstrated an FEV1 of 1.34 at 73 % of predicted.  The patient had an Asthma Control Test with the following results: ACT Total Score: 25.    Assessment and Plan:   1. Asthma, well controlled, mild persistent   2. Other allergic rhinitis      1. Consistently using Asmanex 200 two inhalations one time per day with spacer using clinic samples. Continue 'action plan' for asthma flare with Asmanex three inhalations three times per day.  2. Continue Flonase one spray each nostril 3-7 times per week  3. Continue nasal saline and systane eye drops if needed.  4. Continue ventolin HFA 2 puffs every 4-6 hours if needed.  5. Return in 6 months or earlier if problem   Aella would do better to consistently use a preventative dose of Asmanex even at relatively low dose and I have given her clinic samples to help her consistently use this form of therapy.  She will continue on a nasal steroid at some dose once again in a preventative mode.  I will be very happy to see her  back in this clinic should she have any issues that arise with a respiratory tract but if she does well I will see her back in this clinic in 6 months or earlier if there is a problem.  Allena Katz, MD [facility]

## 2017-11-27 NOTE — Patient Instructions (Addendum)
  1. Consistently using Asmanex 200 two inhalations one time per day with spacer using clinic samples. Continue 'action plan' for asthma flare with Asmanex three inhalations three times per day.  2. Continue Flonase one spray each nostril 3-7 times per week  3. Continue nasal saline and systane eye drops if needed.  4. Continue ventolin HFA 2 puffs every 4-6 hours if needed.  5. Return in 6 months or earlier if problem

## 2017-11-28 ENCOUNTER — Encounter: Payer: Self-pay | Admitting: Allergy and Immunology

## 2017-11-29 ENCOUNTER — Other Ambulatory Visit (HOSPITAL_COMMUNITY)
Admission: RE | Admit: 2017-11-29 | Discharge: 2017-11-29 | Disposition: A | Discharge status: Home / Self Care | Payer: Medicare Other | Source: Ambulatory Visit | Attending: Obstetrics and Gynecology | Admitting: Obstetrics and Gynecology

## 2017-11-29 ENCOUNTER — Encounter: Payer: Self-pay | Admitting: Obstetrics and Gynecology

## 2017-11-29 ENCOUNTER — Ambulatory Visit (INDEPENDENT_AMBULATORY_CARE_PROVIDER_SITE_OTHER): Payer: Medicare Other | Admitting: Obstetrics and Gynecology

## 2017-11-29 VITALS — BP 114/74 | HR 77 | Ht 60.0 in | Wt 140.6 lb

## 2017-11-29 DIAGNOSIS — N760 Acute vaginitis: Secondary | ICD-10-CM | POA: Insufficient documentation

## 2017-11-29 DIAGNOSIS — L900 Lichen sclerosus et atrophicus: Secondary | ICD-10-CM

## 2017-11-29 LAB — POCT URINALYSIS DIP (DEVICE)
Bilirubin Urine: NEGATIVE
Glucose, UA: NEGATIVE mg/dL
Ketones, ur: NEGATIVE mg/dL
Nitrite: NEGATIVE
Protein, ur: NEGATIVE mg/dL
Specific Gravity, Urine: 1.01 (ref 1.005–1.030)
Urobilinogen, UA: 0.2 mg/dL (ref 0.0–1.0)
pH: 5 (ref 5.0–8.0)

## 2017-11-29 MED ORDER — CLOBETASOL PROPIONATE 0.05 % EX OINT: TOPICAL_OINTMENT | CUTANEOUS | 5 refills | Status: DC

## 2017-11-29 NOTE — Progress Notes (Signed)
[name] here for follow-up on lichen sclerosis. Patient reports doing well but reports recent onset of a pruritic vaginal discharge a few days ago. Patient is otherwise doing well and reports using the clobetasol cream as prescribed.  Past Medical History:  Diagnosis Date  . Arthritis   . Asthma   . Chest wall pain   . Depression   . GERD (gastroesophageal reflux disease)   . Head injury, closed, without LOC 02/2013   did not LOC per patient  . Headache   . HLD (hyperlipidemia)   . Hypertension    at times,  . Hyperthyroidism   . 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 Mental disorder   . Near syncope   . Palpitations   . Panic attacks    Past Surgical History:  Procedure Laterality Date  . CHOLECYSTECTOMY    . COLONOSCOPY N/A 11/24/2013   Procedure: COLONOSCOPY;  Surgeon: Juanita Craver, MD;  Location: [facility];  Service: Endoscopy;  Laterality: N/A;  . CORONARY ARTERY BYPASS GRAFT  02/05/2007  . DOPPLER ECHOCARDIOGRAPHY  03/14/2013   EF 65-70%,trivial AI,mild-mod. MR,Mod. TR,mod. concentric hypertrophy  . EP IMPLANTABLE DEVICE N/A 08/22/2016   Procedure: Loop Recorder Removal;  Surgeon: Sanda Klein, MD;  Location: [facility];  Service: Cardiovascular;  Laterality: N/A;  . ESOPHAGOGASTRODUODENOSCOPY N/A 11/24/2013   Procedure: ESOPHAGOGASTRODUODENOSCOPY (EGD);  Surgeon: Juanita Craver, MD;  Location: [facility];  Service: Endoscopy;  Laterality: N/A;  . KNEE ARTHROSCOPY Right 10/17/2013   Procedure: ARTHROSCOPY RIGHT KNEE;  Surgeon: [name], MD;  Location: [facility];  Service: Orthopedics;  Laterality: Right;  . LOOP RECORDER IMPLANT  03/18/2013  . LOOP RECORDER IMPLANT N/A 03/18/2013   Procedure: LOOP RECORDER IMPLANT;  Surgeon: Sanda Klein, MD;  Location: [facility];  Service: Cardiovascular;  Laterality: N/A;  . Nuclear Stress Test  11/30/2011   No ischemia  . Septal Myectomy    . TUBAL LIGATION     Family History  Problem Relation Age of  Onset  . Cancer Mother        breast  . Hypertension Mother   . Cancer Father        prostate  . Diabetes Sister   . Heart attack Sister   . Migraines Neg Hx    Social History   Tobacco Use  . Smoking status: Never Smoker  . Smokeless tobacco: Never Used  Substance Use Topics  . Alcohol use: No  . Drug use: No   ROS See pertinent in HPI  Blood pressure 114/74, pulse 77, height 5' (1.524 m), weight 140 lb 9.6 oz (63.8 kg). GENERAL: Well-developed, well-nourished female in no acute distress.  ABDOMEN: Soft, nontender, nondistended. No organomegaly. PELVIC: Normal external female genitalia. Vagina is pale and atrophic with a 1 cm circular lesion at the introitus unchanged from previous. Small 0.5 cm periurethral lesion also unchanged from previous.  Normal discharge. Normal appearing cervix. Uterus is normal in size. No adnexal mass or tenderness. EXTREMITIES: No cyanosis, clubbing, or edema, 2+ distal pulses.  [name] 71 yo here for lichen sclerosis follow-up and evaluation with vaginitis - Wet prep collected - Refill on clobetasol provided - RTC in 6 months for follow up

## 2017-11-30 LAB — CERVICOVAGINAL ANCILLARY ONLY
Bacterial vaginitis: NEGATIVE
Candida vaginitis: NEGATIVE

## 2017-12-11 HISTORY — PX: GLAUCOMA SURGERY: SHX656

## 2017-12-24 ENCOUNTER — Telehealth: Payer: Self-pay | Admitting: Allergy and Immunology

## 2017-12-24 MED ORDER — ALBUTEROL SULFATE HFA 108 (90 BASE) MCG/ACT IN AERS: 2.0000 | INHALATION_SPRAY | Freq: Four times a day (QID) | RESPIRATORY_TRACT | 2 refills | Status: DC | PRN

## 2017-12-24 NOTE — Telephone Encounter (Signed)
Pt called and said that the Ventolin HFA was not called into [facility] on [address] [phone].

## 2017-12-24 NOTE — Telephone Encounter (Signed)
Sent script for Ventolin into pharmacy.

## 2018-02-21 ENCOUNTER — Telehealth: Payer: Self-pay | Admitting: Allergy and Immunology

## 2018-02-21 NOTE — Telephone Encounter (Signed)
Patient called to let Dr. Neldon Mc know that [employer] will no longer pay for Ventolin. She does not need a refill, for this month, but next month it will need to be changed to an alternative that her insurance will pay for.

## 2018-02-21 NOTE — Telephone Encounter (Signed)
Saving to patient's chart

## 2018-04-28 ENCOUNTER — Ambulatory Visit (HOSPITAL_COMMUNITY)
Admission: EM | Admit: 2018-04-28 | Discharge: 2018-04-28 | Disposition: A | Discharge status: Home / Self Care | Payer: Medicare Other | Attending: Family Medicine | Admitting: Family Medicine

## 2018-04-28 ENCOUNTER — Other Ambulatory Visit: Payer: Self-pay

## 2018-04-28 ENCOUNTER — Encounter (HOSPITAL_COMMUNITY): Payer: Self-pay | Admitting: *Deleted

## 2018-04-28 DIAGNOSIS — B9689 Other specified bacterial agents as the cause of diseases classified elsewhere: Secondary | ICD-10-CM

## 2018-04-28 DIAGNOSIS — J019 Acute sinusitis, unspecified: Secondary | ICD-10-CM

## 2018-04-28 MED ORDER — BENZONATATE 200 MG PO CAPS: 200.0000 mg | ORAL_CAPSULE | Freq: Two times a day (BID) | ORAL | 0 refills | Status: DC | PRN

## 2018-04-28 MED ORDER — AZITHROMYCIN 250 MG PO TABS: 250.0000 mg | ORAL_TABLET | Freq: Every day | ORAL | 0 refills | Status: DC

## 2018-04-28 NOTE — Discharge Instructions (Signed)
Drink plenty of water Use the flonase Take the antibiotic as prescribed Take the tessalon for cough Return as needed

## 2018-04-28 NOTE — ED Provider Notes (Signed)
[facility]    CSN: [id number] Arrival date & time: 04/28/18  1200     History   Chief Complaint Chief Complaint  Patient presents with  . Cough  . nasal drainage    HPI Autumn Fitzgerald is a 72 y.o. female.   HPI  Patient states that she is been sick for over a week.  She has sinus pressure and drainage, postnasal drip, yellow purulent.  Cough.  Some chest pressure.  Chest hurts from all the coughing.  Very tired.  Headache and face pain.  No ear pressure or pain.  Mild sore throat.  No nausea or vomiting.  She states that she just got back from a trip to [place].  She states she retired she goes to [place] she gets sick.  She also has underlying allergies.  These have not been bothering her.  No nausea or vomiting.  No fever chills.  She has not tried over-the-counter medication because she has had heart surgery and is afraid to take over-the-counter products Past Medical History:  Diagnosis Date  . Arthritis   . Asthma   . Chest wall pain   . Depression   . GERD (gastroesophageal reflux disease)   . Head injury, closed, without LOC 02/2013   did not LOC per patient  . Headache   . HLD (hyperlipidemia)   . Hypertension    at times,  . Hyperthyroidism   . 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 Mental disorder   . Near syncope   . Palpitations   . Panic attacks     Patient Active Problem List   Diagnosis Date Noted  . Viral URI with cough 10/09/2017  . Encounter for loop recorder at end of battery life 08/02/2016  . New onset of headaches after age 74 09/30/2015  . Dizziness 09/30/2015  . Vision loss, bilateral 09/30/2015  . Lichen sclerosus et atrophicus 06/18/2014  . Benign gastric polyp 02/20/2014  . Degenerative joint disease involving multiple joints 09/10/2013  . Hypertrophic obstructive cardiomyopathy ([place]) 07/23/2013  . LBBB (left bundle branch block) 07/23/2013  . Subclinical hypothyroidism 05/14/2012  . Lower abdominal pain  01/10/2012  . Panic attacks   . GERD (gastroesophageal reflux disease)   . HLD (hyperlipidemia)   . Mild persistent asthma   . HTN (hypertension)   . Palpitations   . Allergic rhinitis 02/24/2011  . Anxiety, generalized 02/24/2011  . 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 CHOLECYSTECTOMY    . COLONOSCOPY N/A 11/24/2013   Procedure: COLONOSCOPY;  Surgeon: Juanita Craver, MD;  Location: [facility];  Service: Endoscopy;  Laterality: N/A;  . CORONARY ARTERY BYPASS GRAFT  02/05/2007  . DOPPLER ECHOCARDIOGRAPHY  03/14/2013   EF 65-70%,trivial AI,mild-mod. MR,Mod. TR,mod. concentric hypertrophy  . EP IMPLANTABLE DEVICE N/A 08/22/2016   Procedure: Loop Recorder Removal;  Surgeon: Sanda Klein, MD;  Location: [facility];  Service: Cardiovascular;  Laterality: N/A;  . ESOPHAGOGASTRODUODENOSCOPY N/A 11/24/2013   Procedure: ESOPHAGOGASTRODUODENOSCOPY (EGD);  Surgeon: Juanita Craver, MD;  Location: [facility];  Service: Endoscopy;  Laterality: N/A;  . KNEE ARTHROSCOPY Right 10/17/2013   Procedure: ARTHROSCOPY RIGHT KNEE;  Surgeon: [name], MD;  Location: [facility];  Service: Orthopedics;  Laterality: Right;  . LOOP RECORDER IMPLANT  03/18/2013  . LOOP RECORDER IMPLANT N/A 03/18/2013   Procedure: LOOP RECORDER IMPLANT;  Surgeon: Sanda Klein, MD;  Location: [facility];  Service:  Cardiovascular;  Laterality: N/A;  . Nuclear Stress Test  11/30/2011   No ischemia  . Septal Myectomy    . TUBAL LIGATION      OB History    Gravida  5   Para  5   Term  5   Preterm      AB      Living  4     SAB      TAB      Ectopic      Multiple      Live Births               Home Medications    Prior to Admission medications   Medication Sig Start Date End Date Taking? Authorizing Provider  albuterol (VENTOLIN HFA) 108 (90 Base) MCG/ACT inhaler Inhale 2 puffs into the lungs every 6 (six) hours as  needed for wheezing or shortness of breath. 12/24/17  Yes Kozlow, Donnamarie Poag, MD  fluticasone [facility]) 50 MCG/ACT nasal spray 1 spray in each nostril 1-2 times daily 10/09/17  Yes Bobbitt, Sed[place] Muta, MD  mometasone [facility]) 220 MCG/INH inhaler Inhale 2 puffs into the lungs daily.   Yes [provider]  acetaminophen (TYLENOL) 325 MG tablet Take 650 mg by mouth.    [provider]  atenolol (TENORMIN) 25 MG tablet Take 0.5 tablets (12.5 mg total) by mouth daily as needed (for high blood pressure). 12/21/14   Croitoru, Mihai, MD  azithromycin (ZITHROMAX) 250 MG tablet Take 1 tablet (250 mg total) by mouth daily. Take first 2 tablets together, then 1 every day until finished. 04/28/18   Raylene Everts, MD  B Complex Vitamins (B COMPLEX-B12 PO) Take 1 tablet by mouth daily.     [provider]  benzonatate (TESSALON) 200 MG capsule Take 1 capsule (200 mg total) by mouth 2 (two) times daily as needed for cough. 04/28/18   Raylene Everts, MD  CARTIA XT 120 MG 24 hr capsule TK 1 C PO HS 03/29/17   [provider]  clobetasol ointment (TEMOVATE) 0.05 % Apply to affected area every night for 4 weeks, then every other day for 4 weeks and then twice a week for 4 weeks or until resolution. 11/29/17   Constant, Peggy, MD  clonazePAM (KLONOPIN) 1 MG tablet Take 1 mg by mouth at bedtime.     [provider]  ezetimibe-simvastatin (VYTORIN) 10-40 MG per tablet Take 0.5 tablets by mouth at bedtime.     [provider]  levothyroxine (SYNTHROID) 50 MCG tablet Take 50 mcg by mouth daily.  06/15/14   [provider]  Lifitegrast [facility]) 5 % SOLN Place 1 drop into both eyes 2 (two) times daily.     [provider]  Misc Natural Products (OSTEO BI-FLEX/5-LOXIN ADVANCED PO) Take 2 tablets by mouth daily.     [provider]  Multiple Vitamin (MULTIVITAMIN) tablet Take 1 tablet by mouth daily.    [provider]  Propylene Glycol  (SYSTANE BALANCE OP) Apply 1 drop to eye 2 times daily at 12 noon and 4 pm.    [provider]    Family History Family History  Problem Relation Age of Onset  . Cancer Mother        breast  . Hypertension Mother   . Cancer Father        prostate  . Diabetes Sister   . Heart attack Sister   . Migraines Neg Hx     Social  History Social History   Tobacco Use  . Smoking status: Never Smoker  . Smokeless tobacco: Never Used  Substance Use Topics  . Alcohol use: No  . Drug use: No     Allergies   Amoxicillin-pot clavulanate; Statins; and Temazepam   Review of Systems Review of Systems  Constitutional: Negative for chills and fever.  HENT: Positive for congestion, postnasal drip, rhinorrhea, sinus pressure, sinus pain and sore throat.   Eyes: Negative for pain and visual disturbance.  Respiratory: Positive for cough. Negative for shortness of breath.   Cardiovascular: Negative for chest pain and palpitations.  Gastrointestinal: Negative for abdominal pain and vomiting.  Genitourinary: Negative for dysuria and hematuria.  Musculoskeletal: Negative for arthralgias and back pain.  Skin: Negative for color change and rash.  Neurological: Positive for headaches. Negative for seizures and syncope.  All other systems reviewed and are negative.    Physical Exam Triage Vital Signs ED Triage Vitals  Enc Vitals Group     BP 04/28/18 1213 122/78     Pulse Rate 04/28/18 1213 91     Resp --      Temp 04/28/18 1213 98 F (36.7 C)     Temp Source 04/28/18 1213 Oral     SpO2 04/28/18 1213 94 %     Weight --      Height --      Head Circumference --      Peak Flow --      Pain Score 04/28/18 1212 8     Pain Loc --      Pain Edu? --      Excl. in [place]? --    No data found.  Updated Vital Signs BP 122/78 (BP Location: Left Arm)   Pulse 91   Temp 98 F (36.7 C) (Oral)   SpO2 94%   Visual Acuity Right Eye Distance:   Left Eye Distance:   Bilateral Distance:     Right Eye Near:   Left Eye Near:    Bilateral Near:     Physical Exam  Constitutional: She appears well-developed and well-nourished. No distress.  HENT:  Head: Normocephalic and atraumatic.  Right Ear: External ear normal.  Left Ear: External ear normal.  Nose: Nose normal.  Mouth/Throat: Oropharynx is clear and moist.  Nasal membranes are swollen and erythematous.  Maxillary tenderness bilaterally  Eyes: Pupils are equal, round, and reactive to light. Conjunctivae are normal.  Neck: Normal range of motion. Neck supple.  Cardiovascular: Normal rate and regular rhythm.  No murmur heard. Pulmonary/Chest: Effort normal and breath sounds normal. No respiratory distress. She has no wheezes.  Abdominal: Soft. There is no tenderness.  Musculoskeletal: She exhibits no edema.  Lymphadenopathy:    She has cervical adenopathy.  Neurological: She is alert.  Skin: Skin is warm and dry.  Psychiatric: She has a normal mood and affect.  Nursing note and vitals reviewed.    UC Treatments / Results  Labs (all labs ordered are listed, but only abnormal results are displayed) Labs Reviewed - No data to display  EKG None  Radiology No results found.  Procedures Procedures (including critical care time)  Medications Ordered in UC Medications - No data to display  Initial Impression / Assessment and Plan / UC Course  I have reviewed the triage vital signs and the nursing notes.  Pertinent labs & imaging results that were available during my care of the patient were reviewed by me and considered in my medical decision making (  see chart for details).     Discussed that this may have started off as a respiratory virus but after 8 days of symptoms, with her yellow sputum and fatigue I think a course of antibiotics is not unreasonable. Final Clinical Impressions(s) / UC Diagnoses   Final diagnoses:  Acute bacterial sinusitis     Discharge Instructions     Drink plenty of  water Use the flonase Take the antibiotic as prescribed Take the tessalon for cough Return as needed    ED Prescriptions    Medication Sig Dispense Auth. Provider   benzonatate (TESSALON) 200 MG capsule Take 1 capsule (200 mg total) by mouth 2 (two) times daily as needed for cough. 20 capsule Raylene Everts, MD   azithromycin (ZITHROMAX) 250 MG tablet Take 1 tablet (250 mg total) by mouth daily. Take first 2 tablets together, then 1 every day until finished. 6 tablet Raylene Everts, MD     Controlled Substance Prescriptions [place] Controlled Substance Registry consulted? Not Applicable   Raylene Everts, MD 04/28/18 2215

## 2018-04-28 NOTE — ED Triage Notes (Signed)
Pt c/o cough, drainage and pressure

## 2018-04-29 ENCOUNTER — Other Ambulatory Visit: Payer: Self-pay | Admitting: Allergy and Immunology

## 2018-05-01 ENCOUNTER — Other Ambulatory Visit: Payer: Self-pay | Admitting: Allergy and Immunology

## 2018-05-02 ENCOUNTER — Ambulatory Visit (INDEPENDENT_AMBULATORY_CARE_PROVIDER_SITE_OTHER): Payer: Medicare Other | Admitting: Allergy

## 2018-05-02 ENCOUNTER — Encounter: Payer: Self-pay | Admitting: Allergy

## 2018-05-02 VITALS — BP 130/82 | HR 68 | Temp 98.0°F | Resp 16

## 2018-05-02 DIAGNOSIS — J4530 Mild persistent asthma, uncomplicated: Secondary | ICD-10-CM | POA: Diagnosis not present

## 2018-05-02 DIAGNOSIS — J00 Acute nasopharyngitis [common cold]: Secondary | ICD-10-CM | POA: Diagnosis not present

## 2018-05-02 DIAGNOSIS — J3089 Other allergic rhinitis: Secondary | ICD-10-CM

## 2018-05-02 NOTE — Progress Notes (Signed)
Follow-up Note  RE: TANAY MASSIAH MRN: [id number] DOB: 09-14-1946 Date of Office Visit: 05/02/2018   History of present illness: Autumn Fitzgerald is a 72 y.o. female presenting today for sick visit. She was last seen in the office on 11/27/17 by Dr. Neldon Mc.  She states she traveled to [place] by car with her son on 5/11 and returned to [facility] on 5/18.  Once she got to [place] she states she started to get sick with throat pain, cough and runny nose.  She denies any fevers.  When she returned to [facility] on 5/19 she went to UC as she states the cough continued as well as the nasal drainage.  She was prescribed a Z-pack which she completed today and also prescribed tessalon perles which have been helpful in reducing cough.  She was also using asmanex 2 puff twice a day but states has not helped thus she stopped taking after a week.   Asmanex was to be added during asthma flares/respiratory illnesses.  Albuterol she is using about once a day with some relief.  Using Flonase 1 spray each nostril twice a day which she states did not help thus she stopped using after a week as well.     Review of systems: Review of Systems  Constitutional: Negative for chills, fever and malaise/fatigue.  HENT: Positive for congestion and sore throat. Negative for ear discharge, ear pain, nosebleeds and sinus pain.   Eyes: Negative for pain, discharge and redness.  Respiratory: Positive for cough. Negative for shortness of breath and wheezing.   Cardiovascular: Negative for chest pain.  Gastrointestinal: Negative for abdominal pain, constipation, diarrhea, nausea and vomiting.  Musculoskeletal: Negative for joint pain.  Skin: Negative for itching and rash.  Neurological: Negative for headaches.    All other systems negative unless noted above in HPI  Past medical/social/surgical/family history have been reviewed and are unchanged unless specifically indicated below.  No changes  Medication List: Allergies as of 05/02/2018      Reactions   Amoxicillin-pot Clavulanate Other (See Comments)   Severe pains and GI symptoms   Statins Other (See Comments)   Myalgias with lipitor; tolerates vytorin   Temazepam Other (See Comments)   Dizziness and doesn't work for patient      Medication List        Accurate as of 05/02/18  6:12 PM. Always use your most recent med list.          acetaminophen 325 MG tablet Commonly known as:  TYLENOL Take 650 mg by mouth.   albuterol 108 (90 Base) MCG/ACT inhaler Commonly known as:  PROVENTIL HFA;VENTOLIN HFA INHALE 2 PUFFS INTO LUNGS EVERY 6 HOURS AS NEEDED FOR WHEEZING OR FOR SHORTNESS OF BREATH   VENTOLIN HFA 108 (90 Base) MCG/ACT inhaler Generic drug:  albuterol INHALE 2 PUFFS BY MOUTH EVERY 4 TO 6 HOURS AS NEEDED FOR COUGH OR WHEEZING   atenolol 25 MG tablet Commonly known as:  TENORMIN Take 0.5 tablets (12.5 mg total) by mouth daily as needed (for high blood pressure).   azithromycin 250 MG tablet Commonly known as:  ZITHROMAX Take 1 tablet (250 mg total) by mouth daily. Take first 2 tablets together, then 1 every day until finished.   B COMPLEX-B12 PO Take 1 tablet by mouth daily.   CARTIA XT 120 MG 24 hr capsule Generic drug:  diltiazem TK 1 C PO HS   clobetasol ointment 0.05 % Commonly known as:  TEMOVATE Apply to affected area  every night for 4 weeks, then every other day for 4 weeks and then twice a week for 4 weeks or until resolution.   clonazePAM 1 MG tablet Commonly known as:  KLONOPIN Take 1 mg by mouth at bedtime.   cycloSPORINE 0.05 % ophthalmic emulsion Commonly known as:  RESTASIS 1 drop 2 (two) times daily.   DEXILANT 30 MG capsule Generic drug:  Dexlansoprazole   ezetimibe-simvastatin 10-40 MG tablet Commonly known as:  VYTORIN Take 0.5 tablets by mouth at bedtime.   fluticasone 50 MCG/ACT nasal spray Commonly known as:  FLONASE 1 spray in each nostril 1-2 times daily   mometasone 220 MCG/INH inhaler Commonly known as:   ASMANEX Inhale 2 puffs into the lungs daily.   multivitamin tablet Take 1 tablet by mouth daily.   OSTEO BI-FLEX/5-LOXIN ADVANCED PO Take 2 tablets by mouth daily.   SYNTHROID 50 MCG tablet Generic drug:  levothyroxine Take 50 mcg by mouth daily.   SYSTANE BALANCE OP Apply 1 drop to eye 2 times daily at 12 noon and 4 pm.       Known medication allergies: Allergies  Allergen Reactions  . Amoxicillin-Pot Clavulanate Other (See Comments)    Severe pains and GI symptoms  . Statins Other (See Comments)    Myalgias with lipitor; tolerates vytorin  . Temazepam Other (See Comments)    Dizziness and doesn't work for patient     Physical examination: Blood pressure 130/82, pulse 68, temperature 98 F (36.7 C), temperature source Tympanic, resp. rate 16.  General: Alert, interactive, in no acute distress. HEENT: PERRLA, TMs pearly gray, turbinates mildly edematous with clear discharge, post-pharynx non erythematous. Neck: Supple without lymphadenopathy. Lungs: Clear to auscultation without wheezing, rhonchi or rales. {no increased work of breathing. CV: Normal S1, S2 without murmurs. Abdomen: Nondistended, nontender. Skin: Warm and dry, without lesions or rashes. Extremities:  No clubbing, cyanosis or edema. Neuro:   Grossly intact.  Diagnositics/Labs: Spirometry: FEV1: 1.31L  73%, FVC: 1.49L 63%.  Post-BD did not any significant change in FEV1.    Assessment and plan:   Mild persistent asthma - will change asmanex to Symbicort due to continued cough Allergic rhinitis - encouraged nasal saline rinse as pt is adamant she was told while living in [place] to not use any antihistamines of any kind due to heart condition.   URI, improving - she has completed an azithromycin course.  Symptoms sound most consistent with viral illness turn bronchitis with continued cough.  Will add mucinex to regimen to help loosen mucus/drainage to easier mobilization  1. Consistently using Symbicort  166mcg  two inhalations twice a day with spacer using clinic samples.   2. Continue Flonase one spray each nostril 3-7 times per week  3. Continue nasal saline and systane eye drops if needed.  4. Continue ventolin HFA 2 puffs every 4-6 hours if needed.  5. Take Mucinex 600-1200mg  twice a day with plenty of water to help thin/loosen mucus  6. Perform nasal saline rinse daily to help clean the nose  7. Return in 4-6 months or earlier if problem   I appreciate the opportunity to take part in Eliska's care. Please do not hesitate to contact me with questions.  Sincerely,   Prudy Feeler, MD Allergy/Immunology [facility] [place]

## 2018-05-02 NOTE — Patient Instructions (Addendum)
  1. Consistently using Symbicort 176mcg  two inhalations twice a day with spacer using clinic samples.   2. Continue Flonase one spray each nostril 3-7 times per week  3. Continue nasal saline and systane eye drops if needed.  4. Continue ventolin HFA 2 puffs every 4-6 hours if needed.  5. Take Mucinex 600-1200mg  twice a day with plenty of water to help thin/loosen mucus  6. Perform nasal saline rinse daily to help clean the nose  7. Return in 4-6 months or earlier if problem

## 2018-05-09 ENCOUNTER — Emergency Department (HOSPITAL_COMMUNITY): Payer: Medicare Other

## 2018-05-09 ENCOUNTER — Emergency Department (HOSPITAL_COMMUNITY)
Admission: EM | Admit: 2018-05-09 | Discharge: 2018-05-09 | Disposition: A | Discharge status: Home / Self Care | Payer: Medicare Other | Attending: Emergency Medicine | Admitting: Emergency Medicine

## 2018-05-09 ENCOUNTER — Encounter (HOSPITAL_COMMUNITY): Payer: Self-pay

## 2018-05-09 ENCOUNTER — Other Ambulatory Visit: Payer: Self-pay

## 2018-05-09 DIAGNOSIS — E039 Hypothyroidism, unspecified: Secondary | ICD-10-CM | POA: Insufficient documentation

## 2018-05-09 DIAGNOSIS — J45909 Unspecified asthma, uncomplicated: Secondary | ICD-10-CM | POA: Diagnosis not present

## 2018-05-09 DIAGNOSIS — R05 Cough: Secondary | ICD-10-CM | POA: Insufficient documentation

## 2018-05-09 DIAGNOSIS — R059 Cough, unspecified: Secondary | ICD-10-CM

## 2018-05-09 DIAGNOSIS — I10 Essential (primary) hypertension: Secondary | ICD-10-CM | POA: Diagnosis not present

## 2018-05-09 DIAGNOSIS — Z79899 Other long term (current) drug therapy: Secondary | ICD-10-CM | POA: Insufficient documentation

## 2018-05-09 LAB — BASIC METABOLIC PANEL
Anion gap: 10 (ref 5–15)
BUN: 11 mg/dL (ref 6–20)
CO2: 27 mmol/L (ref 22–32)
Calcium: 9 mg/dL (ref 8.9–10.3)
Chloride: 104 mmol/L (ref 101–111)
Creatinine, Ser: 0.79 mg/dL (ref 0.44–1.00)
GFR calc Af Amer: >60 mL/min (ref >60)
GFR calc non Af Amer: >60 mL/min (ref >60)
Glucose, Bld: 138 mg/dL — ABNORMAL HIGH (ref 65–99)
Potassium: 4.1 mmol/L (ref 3.5–5.1)
Sodium: 141 mmol/L (ref 135–145)

## 2018-05-09 LAB — CBC
HCT: 43 % (ref 36.0–46.0)
Hemoglobin: 13.8 g/dL (ref 12.0–15.0)
MCH: 27.9 pg (ref 26.0–34.0)
MCHC: 32.1 g/dL (ref 30.0–36.0)
MCV: 86.9 fL (ref 78.0–100.0)
Platelets: 276 10*3/uL (ref 150–400)
RBC: 4.95 MIL/uL (ref 3.87–5.11)
RDW: 13.3 % (ref 11.5–15.5)
WBC: 6.2 10*3/uL (ref 4.0–10.5)

## 2018-05-09 LAB — I-STAT TROPONIN, ED: Troponin i, poc: 0 ng/mL (ref 0.00–0.08)

## 2018-05-09 NOTE — ED Triage Notes (Signed)
Pt reports cough, chest pain, generalized weakness for a week. Pt states she was treated for URI but has gotten worse. Chest pain is right side in nature. No distress noted in triage. UTA cough.

## 2018-05-09 NOTE — ED Notes (Signed)
Pt stated that as she got up to go to the restroom she started to have "heart palpitations" that went up her neck. Then she felt "weak and fatigued." RN was notified.

## 2018-05-11 NOTE — ED Provider Notes (Signed)
[facility] EMERGENCY DEPARTMENT Provider Note   CSN: [id number] Arrival date & time: 05/09/18  1345     History   Chief Complaint Chief Complaint  Patient presents with  . Cough  . Chest Pain    HPI Autumn Fitzgerald is a 72 y.o. female.  HPI   73 year old female with multiple complaints.  She has been dealing with respiratory symptoms for the past several weeks.  Symptoms including cough, facial pressure postnasal drip.  Some improvement with symptomatic treatment although still persistent.  The past few days has been having palpitations.  She describes a pulsing sensation that she can feel in her chest up into her neck.  She denies any acute pain with it.  No dizziness or lightheadedness.  No fevers or chills.  No unusual leg pain or swelling.  Past Medical History:  Diagnosis Date  . Arthritis   . Asthma   . Chest wall pain   . Depression   . GERD (gastroesophageal reflux disease)   . Head injury, closed, without LOC 02/2013   did not LOC per patient  . Headache   . HLD (hyperlipidemia)   . Hypertension    at times,  . Hyperthyroidism   . 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 Mental disorder   . Near syncope   . Palpitations   . Panic attacks     Patient Active Problem List   Diagnosis Date Noted  . Viral URI with cough 10/09/2017  . Encounter for loop recorder at end of battery life 08/02/2016  . New onset of headaches after age 41 09/30/2015  . Dizziness 09/30/2015  . Vision loss, bilateral 09/30/2015  . Lichen sclerosus et atrophicus 06/18/2014  . Benign gastric polyp 02/20/2014  . Degenerative joint disease involving multiple joints 09/10/2013  . Hypertrophic obstructive cardiomyopathy ([place]) 07/23/2013  . LBBB (left bundle branch block) 07/23/2013  . Subclinical hypothyroidism 05/14/2012  . Lower abdominal pain 01/10/2012  . Panic attacks   . GERD (gastroesophageal reflux disease)   . HLD (hyperlipidemia)   . Mild  persistent asthma   . HTN (hypertension)   . Palpitations   . Allergic rhinitis 02/24/2011  . Anxiety, generalized 02/24/2011  . 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 CHOLECYSTECTOMY    . COLONOSCOPY N/A 11/24/2013   Procedure: COLONOSCOPY;  Surgeon: Juanita Craver, MD;  Location: [facility];  Service: Endoscopy;  Laterality: N/A;  . CORONARY ARTERY BYPASS GRAFT  02/05/2007  . DOPPLER ECHOCARDIOGRAPHY  03/14/2013   EF 65-70%,trivial AI,mild-mod. MR,Mod. TR,mod. concentric hypertrophy  . EP IMPLANTABLE DEVICE N/A 08/22/2016   Procedure: Loop Recorder Removal;  Surgeon: Sanda Klein, MD;  Location: [facility];  Service: Cardiovascular;  Laterality: N/A;  . ESOPHAGOGASTRODUODENOSCOPY N/A 11/24/2013   Procedure: ESOPHAGOGASTRODUODENOSCOPY (EGD);  Surgeon: Juanita Craver, MD;  [facility];  Service: Endoscopy;  Laterality: N/A;  . KNEE ARTHROSCOPY Right 10/17/2013   Procedure: ARTHROSCOPY RIGHT KNEE;  Surgeon: [name], MD;  Location: [facility];  Service: Orthopedics;  Laterality: Right;  . LOOP RECORDER IMPLANT  03/18/2013  . LOOP RECORDER IMPLANT N/A 03/18/2013   Procedure: LOOP RECORDER IMPLANT;  Surgeon: Sanda Klein, MD;  Location: [facility] CATH LAB;  Service: Cardiovascular;  Laterality: N/A;  . Nuclear Stress Test  11/30/2011   No ischemia  . Septal Myectomy    . TUBAL LIGATION       OB History  Gravida  5   Para  5   Term  5   Preterm      AB      Living  4     SAB      TAB      Ectopic      Multiple      Live Births               Home Medications    Prior to Admission medications   Medication Sig Start Date End Date Taking? Authorizing Provider  acetaminophen (TYLENOL) 325 MG tablet Take 650 mg by mouth.    [provider]  albuterol (PROVENTIL HFA;VENTOLIN HFA) 108 (90 Base) MCG/ACT inhaler INHALE 2 PUFFS INTO LUNGS EVERY 6 HOURS AS NEEDED FOR  WHEEZING OR FOR SHORTNESS OF BREATH 04/29/18   Kozlow, Donnamarie Poag, MD  atenolol (TENORMIN) 25 MG tablet Take 0.5 tablets (12.5 mg total) by mouth daily as needed (for high blood pressure). 12/21/14   Croitoru, Mihai, MD  azithromycin (ZITHROMAX) 250 MG tablet Take 1 tablet (250 mg total) by mouth daily. Take first 2 tablets together, then 1 every day until finished. 04/28/18   Raylene Everts, MD  B Complex Vitamins (B COMPLEX-B12 PO) Take 1 tablet by mouth daily.     [provider]  CARTIA XT 120 MG 24 hr capsule TK 1 C PO HS 03/29/17   [provider]  clobetasol ointment (TEMOVATE) 0.05 % Apply to affected area every night for 4 weeks, then every other day for 4 weeks and then twice a week for 4 weeks or until resolution. 11/29/17   Constant, Peggy, MD  clonazePAM (KLONOPIN) 1 MG tablet Take 1 mg by mouth at bedtime.     [provider]  cycloSPORINE (RESTASIS) 0.05 % ophthalmic emulsion 1 drop 2 (two) times daily.    [provider]  DEXILANT 30 MG capsule  03/01/18   [provider]  ezetimibe-simvastatin (VYTORIN) 10-40 MG per tablet Take 0.5 tablets by mouth at bedtime.     [provider]  fluticasone Asencion Islam) 50 MCG/ACT nasal spray 1 spray in each nostril 1-2 times daily 10/09/17   Bobbitt, Sed[place] Muta, MD  levothyroxine (SYNTHROID) 50 MCG tablet Take 50 mcg by mouth daily.  06/15/14   [provider]  Misc Natural Products (OSTEO BI-FLEX/5-LOXIN ADVANCED PO) Take 2 tablets by mouth daily.     [provider]  mometasone [facility]) 220 MCG/INH inhaler Inhale 2 puffs into the lungs daily.    [provider]  Multiple Vitamin (MULTIVITAMIN) tablet Take 1 tablet by mouth daily.    [provider]  Propylene Glycol (SYSTANE BALANCE OP) Apply 1 drop to eye 2 times daily at 12 noon and 4 pm.    [provider]  VENTOLIN HFA 108 (90 Base) MCG/ACT inhaler INHALE 2 PUFFS BY MOUTH EVERY 4 TO 6 HOURS AS NEEDED  FOR COUGH OR WHEEZING 05/01/18   Kozlow, Donnamarie Poag, MD    Family History Family History  Problem Relation Age of Onset  . Cancer Mother        breast  . Hypertension Mother   . Cancer Father        prostate  . Diabetes Sister   . Heart attack Sister   . Migraines Neg Hx     Social History Social History   Tobacco Use  . Smoking status: Never Smoker  . Smokeless tobacco: Never Used  Substance Use Topics  .  Alcohol use: No  . Drug use: No     Allergies   Amoxicillin-pot clavulanate; Statins; and Temazepam   Review of Systems Review of Systems  All systems reviewed and negative, other than as noted in HPI.  Physical Exam Updated Vital Signs BP (!) 145/83   Pulse 80   Temp 98 F (36.7 C) (Oral)   Resp 15   SpO2 98%   Physical Exam  Constitutional: She appears well-developed and well-nourished. No distress.  HENT:  Head: Normocephalic and atraumatic.  Eyes: Conjunctivae are normal. Right eye exhibits no discharge. Left eye exhibits no discharge.  Neck: Neck supple.  Cardiovascular: Normal rate, regular rhythm and normal heart sounds. Exam reveals no gallop and no friction rub.  No murmur heard. Pulmonary/Chest: Effort normal and breath sounds normal. No respiratory distress.  Abdominal: Soft. She exhibits no distension. There is no tenderness.  Musculoskeletal: She exhibits no edema or tenderness.  Lower extremities symmetric as compared to each other. No calf tenderness. Negative Homan's. No palpable cords.   Neurological: She is alert.  Skin: Skin is warm and dry.  Psychiatric: She has a normal mood and affect. Her behavior is normal. Thought content normal.  Nursing note and vitals reviewed.    ED Treatments / Results  Labs (all labs ordered are listed, but only abnormal results are displayed) Labs Reviewed  BASIC METABOLIC PANEL - Abnormal; Notable for the following components:      Result Value   Glucose, Bld 138 (*)    All other components within  normal limits  CBC  I-STAT TROPONIN, ED    EKG EKG Interpretation  Date/Time:  Thursday May 09 2018 13:58:46 EDT Ventricular Rate:  86 PR Interval:  138 QRS Duration: 118 QT Interval:  384 QTC Calculation: 459 R Axis:   98 Text Interpretation:  Normal sinus rhythm Rightward axis Incomplete left bundle branch block ST & T wave abnormality, consider inferolateral ischemia Abnormal ECG No significant change since last tracing Confirmed by Virgel Manifold [phone]) on 05/09/2018 7:16:04 PM   Radiology No results found.  Procedures Procedures (including critical care time)  Medications Ordered in ED Medications - No data to display   Initial Impression / Assessment and Plan / ED Course  I have reviewed the triage vital signs and the nursing notes.  Pertinent labs & imaging results that were available during my care of the patient were reviewed by me and considered in my medical decision making (see chart for details).     72 year old female with cough and palpitations.  The cough is been ongoing for weeks.  Suspect bronchitis.  Continue to depend treatment of this.  She has had intermittent palpitations.  I doubt ACS, PE, dissection or other emergent process.  She has established cardiology care.  Outpatient follow-up.  Return precautions were discussed.  Final Clinical Impressions(s) / ED Diagnoses   Final diagnoses:  Cough    ED Discharge Orders    None       Virgel Manifold, MD 05/11/18 2208

## 2018-05-28 ENCOUNTER — Other Ambulatory Visit: Payer: Self-pay | Admitting: Allergy and Immunology

## 2018-05-30 ENCOUNTER — Other Ambulatory Visit: Payer: Self-pay | Admitting: Internal Medicine

## 2018-05-30 DIAGNOSIS — Z1231 Encounter for screening mammogram for malignant neoplasm of breast: Secondary | ICD-10-CM

## 2018-07-03 ENCOUNTER — Ambulatory Visit: Payer: Self-pay

## 2018-07-09 ENCOUNTER — Ambulatory Visit
Admission: RE | Admit: 2018-07-09 | Discharge: 2018-07-09 | Disposition: A | Discharge status: Home / Self Care | Payer: Medicare Other | Source: Ambulatory Visit | Attending: Internal Medicine | Admitting: Internal Medicine

## 2018-07-09 DIAGNOSIS — Z1231 Encounter for screening mammogram for malignant neoplasm of breast: Secondary | ICD-10-CM

## 2018-07-15 LAB — HM COLONOSCOPY

## 2018-08-25 ENCOUNTER — Other Ambulatory Visit: Payer: Self-pay | Admitting: Allergy and Immunology

## 2018-09-10 ENCOUNTER — Encounter: Payer: Self-pay | Admitting: Allergy and Immunology

## 2018-09-10 ENCOUNTER — Ambulatory Visit (INDEPENDENT_AMBULATORY_CARE_PROVIDER_SITE_OTHER): Payer: Medicare Other | Admitting: Allergy and Immunology

## 2018-09-10 VITALS — BP 112/70 | HR 72 | Resp 16 | Ht 60.0 in | Wt 141.0 lb

## 2018-09-10 DIAGNOSIS — J3089 Other allergic rhinitis: Secondary | ICD-10-CM | POA: Diagnosis not present

## 2018-09-10 DIAGNOSIS — J4540 Moderate persistent asthma, uncomplicated: Secondary | ICD-10-CM

## 2018-09-10 MED ORDER — FLUTICASONE PROPIONATE 50 MCG/ACT NA SUSP: NASAL | 5 refills | Status: DC

## 2018-09-10 NOTE — Progress Notes (Signed)
Follow-up Note  Referring Provider: Nolene Ebbs, MD Primary Provider: Nolene Ebbs, MD Date of Office Visit: 09/10/2018  Subjective:   Autumn Fitzgerald (DOB: 1946-09-25) is a 72 y.o. female who returns to the [facility] on 09/10/2018 in re-evaluation of the following:  HPI: Autumn Fitzgerald presents to this clinic in evaluation of asthma and allergic rhinitis.  I have not seen her in this clinic since 28 January 2017.  Apparently in May she had a very difficult time with an upper respiratory tract infection that also resulted in some inflammation of her lower airways requiring the administration of an antibiotic and systemic steroids.  After about 2 weeks of that episode she did resolve all respiratory tract symptoms and she has been doing relatively well.  However, she still always has a little bit of an intermittent cough and appears to have some intermittent nasal congestion and runny nose on occasion.  She does not use any controller agents on a regular basis.  She does use a short acting bronchodilator with a frequency of less than 1 time per week.  She has had some other medical issues develop including significant eye problems that required surgery for both cataract removal and glaucoma and she has less than adequate vision as a result of the surgeries.  In addition, she apparently has bilateral rotator cuff tears with lots of chronic pain and she is considering having surgery for that abnormality.  Surgery Fitzgerald be very difficult as she does not really have transportation or people to take care of her at home to any large extent.  Allergies as of 09/10/2018      Reactions   Amoxicillin-pot Clavulanate Other (See Comments)   Severe pains and GI symptoms   Statins Other (See Comments)   Myalgias with lipitor; tolerates vytorin   Temazepam Other (See Comments)   Dizziness and doesn't work for patient      Medication List      acetaminophen 325 MG tablet Commonly  known as:  TYLENOL Take 650 mg by mouth.   atenolol 25 MG tablet Commonly known as:  TENORMIN Take 0.5 tablets (12.5 mg total) by mouth daily as needed (for high blood pressure).   azithromycin 250 MG tablet Commonly known as:  ZITHROMAX Take 1 tablet (250 mg total) by mouth daily. Take first 2 tablets together, then 1 every day until finished.   B COMPLEX-B12 PO Take 1 tablet by mouth daily.   CARTIA XT 120 MG 24 hr capsule Generic drug:  diltiazem TK 1 C PO HS   clobetasol ointment 0.05 % Commonly known as:  TEMOVATE Apply to affected area every night for 4 weeks, then every other day for 4 weeks and then twice a week for 4 weeks or until resolution.   clonazePAM 1 MG tablet Commonly known as:  KLONOPIN Take 1 mg by mouth at bedtime.   cycloSPORINE 0.05 % ophthalmic emulsion Commonly known as:  RESTASIS 1 drop 2 (two) times daily.   DEXILANT 30 MG capsule Generic drug:  Dexlansoprazole   ezetimibe-simvastatin 10-40 MG tablet Commonly known as:  VYTORIN Take 0.5 tablets by mouth at bedtime.   fluticasone 50 MCG/ACT nasal spray Commonly known as:  FLONASE 1 spray in each nostril 1-2 times daily   mometasone 220 MCG/INH inhaler Commonly known as:  ASMANEX Inhale 2 puffs into the lungs daily.   multivitamin tablet Take 1 tablet by mouth daily.   OSTEO BI-FLEX/5-LOXIN ADVANCED PO Take 2 tablets by  mouth daily.   SYNTHROID 50 MCG tablet Generic drug:  levothyroxine Take 50 mcg by mouth daily.   SYSTANE BALANCE OP Apply 1 drop to eye 2 times daily at 12 noon and 4 pm.   VENTOLIN HFA 108 (90 Base) MCG/ACT inhaler Generic drug:  albuterol INHALE 2 PUFFS BY MOUTH EVERY 4 TO 6 HOURS AS NEEDED FOR COUGH OR WHEEZING   albuterol 108 (90 Base) MCG/ACT inhaler Commonly known as:  PROVENTIL HFA;VENTOLIN HFA INHALE TWO PUFFS INTO THE LUNGS EVERY 6 HOURS AS NEEDED FOR WHEEZING OR SHORTNESS OF BREATH       Past Medical History:  Diagnosis Date  . Arthritis   .  Asthma   . Chest wall pain   . Depression   . GERD (gastroesophageal reflux disease)   . Head injury, closed, without LOC 02/2013   did not LOC per patient  . Headache   . HLD (hyperlipidemia)   . Hypertension    at times,  . Hyperthyroidism   . 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 Mental disorder   . Near syncope   . Palpitations   . Panic attacks     Past Surgical History:  Procedure Laterality Date  . CHOLECYSTECTOMY    . COLONOSCOPY N/A 11/24/2013   Procedure: COLONOSCOPY;  Surgeon: Juanita Craver, MD;  [facility];  Service: Endoscopy;  Laterality: N/A;  . CORONARY ARTERY BYPASS GRAFT  02/05/2007  . DOPPLER ECHOCARDIOGRAPHY  03/14/2013   EF 65-70%,trivial AI,mild-mod. MR,Mod. TR,mod. concentric hypertrophy  . EP IMPLANTABLE DEVICE N/A 08/22/2016   Procedure: Loop Recorder Removal;  Surgeon: Sanda Klein, MD;  Location: [place] CV LAB;  Service: Cardiovascular;  Laterality: N/A;  . ESOPHAGOGASTRODUODENOSCOPY N/A 11/24/2013   Procedure: ESOPHAGOGASTRODUODENOSCOPY (EGD);  Surgeon: Juanita Craver, MD;  [facility];  Service: Endoscopy;  Laterality: N/A;  . KNEE ARTHROSCOPY Right 10/17/2013   Procedure: ARTHROSCOPY RIGHT KNEE;  Surgeon: [name], MD;  Location: [facility];  Service: Orthopedics;  Laterality: Right;  . LOOP RECORDER IMPLANT  03/18/2013  . LOOP RECORDER IMPLANT N/A 03/18/2013   Procedure: LOOP RECORDER IMPLANT;  Surgeon: Sanda Klein, MD;  Location: [facility] CATH LAB;  Service: Cardiovascular;  Laterality: N/A;  . Nuclear Stress Test  11/30/2011   No ischemia  . Septal Myectomy    . TUBAL LIGATION      Review of systems negative except as noted in HPI / PMHx or noted below:  Review of Systems  Constitutional: Negative.   HENT: Negative.   Eyes: Negative.   Respiratory: Negative.   Cardiovascular: Negative.   Gastrointestinal: Negative.   Genitourinary: Negative.   Musculoskeletal: Negative.   Skin: Negative.   Neurological:  Negative.   Endo/Heme/Allergies: Negative.   Psychiatric/Behavioral: Negative.      Objective:   Vitals:   09/10/18 1500  BP: 112/70  Pulse: 72  Resp: 16   Height: 5' (152.4 cm)  Weight: 141 lb (64 kg)   Physical Exam  HENT:  Head: Normocephalic. Head is without right periorbital erythema and without left periorbital erythema.  Right Ear: Tympanic membrane, external ear and ear canal normal.  Left Ear: Tympanic membrane, external ear and ear canal normal.  Nose: Nose normal. No mucosal edema or rhinorrhea.  Mouth/Throat: Oropharynx is clear and moist and mucous membranes are normal. No oropharyngeal exudate.  Eyes: Pupils are equal, round, and reactive to light. Conjunctivae and lids are normal.  Neck: Trachea normal. No tracheal deviation present. No thyromegaly present.  Cardiovascular: Normal rate,  regular rhythm, S1 normal, S2 normal and normal heart sounds.  No murmur heard. Pulmonary/Chest: Effort normal. No stridor. No respiratory distress. She has no wheezes. She has no rales. She exhibits no tenderness.  Abdominal: Soft. She exhibits no distension and no mass. There is no hepatosplenomegaly. There is no tenderness. There is no rebound and no guarding.  Musculoskeletal: She exhibits no edema or tenderness.  Lymphadenopathy:       Head (right side): No tonsillar adenopathy present.       Head (left side): No tonsillar adenopathy present.    She has no cervical adenopathy.    She has no axillary adenopathy.  Neurological: She is alert.  Skin: No rash noted. She is not diaphoretic. No erythema. No pallor. Nails show no clubbing.    Diagnostics:    Spirometry was performed and demonstrated an FEV1 of 1.34 at 78 % of predicted.  The patient had an Asthma Control Test with the following results: ACT Total Score: 24.    Assessment and Plan:   1. Not well controlled moderate persistent asthma   2. Other allergic rhinitis   3. Non-seasonal allergic rhinitis,  unspecified trigger     1. Every Day use Symbicort 160mg  two inhalations one time a day with spacer using clinic samples.   2. Every Day use Flonase one spray each nostril one time per day during periods of upper airway symptoms  3. Continue nasal saline and systane eye drops if needed.  4. Continue ventolin HFA 2 puffs every 4-6 hours if needed.  5. Return in 6 months or earlier if problem   6.  Obtain fall flu vaccine  I think Autumn Fitzgerald would do better if she uses some anti-inflammatory agents for both her upper and lower airways on a regular basis and I have asked her to do so as instructed above.  We will give her samples of Symbicort to help with the financial burden of utilizing these medications.  If she does well I will see her back in this clinic in 6 months or earlier if there is a problem.  Allena Katz, MD Allergy / Immunology [place]

## 2018-09-10 NOTE — Patient Instructions (Addendum)
  1. Every Day use Symbicort 160mg  two inhalations one time a day with spacer using clinic samples.   2. Every Day use Flonase one spray each nostril one time per day during periods of upper airway symptoms  3. Continue nasal saline and systane eye drops if needed.  4. Continue ventolin HFA 2 puffs every 4-6 hours if needed.  5. Return in 6 months or earlier if problem   6. Obtain fall flu vaccine

## 2018-09-11 ENCOUNTER — Encounter: Payer: Self-pay | Admitting: Allergy and Immunology

## 2018-09-12 ENCOUNTER — Ambulatory Visit: Payer: Self-pay | Admitting: Obstetrics and Gynecology

## 2018-09-12 ENCOUNTER — Ambulatory Visit (INDEPENDENT_AMBULATORY_CARE_PROVIDER_SITE_OTHER): Payer: Medicare Other | Admitting: Obstetrics and Gynecology

## 2018-09-12 ENCOUNTER — Encounter: Payer: Self-pay | Admitting: Obstetrics and Gynecology

## 2018-09-12 VITALS — BP 153/90 | HR 91 | Wt 142.8 lb

## 2018-09-12 DIAGNOSIS — N904 Leukoplakia of vulva: Secondary | ICD-10-CM

## 2018-09-12 MED ORDER — CLOBETASOL PROPIONATE 0.05 % EX OINT: TOPICAL_OINTMENT | CUTANEOUS | 5 refills | Status: DC

## 2018-09-12 NOTE — Progress Notes (Signed)
72 yo here for follow up on lichen sclerosus. Patient is not sexually active and denies any postmenopausal vaginal bleeding.She has been using the clobetasol cream nightly. She denies any pain or vulva irritation  Past Medical History:  Diagnosis Date  . Arthritis   . Asthma   . Chest wall pain   . Depression   . GERD (gastroesophageal reflux disease)   . Head injury, closed, without LOC 02/2013   did not LOC per patient  . Headache   . HLD (hyperlipidemia)   . Hypertension    at times,  . Hyperthyroidism   . 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 Mental disorder   . Near syncope   . Palpitations   . Panic attacks    Past Surgical History:  Procedure Laterality Date  . CHOLECYSTECTOMY    . COLONOSCOPY N/A 11/24/2013   Procedure: COLONOSCOPY;  Surgeon: Juanita Craver, MD;  [facility];  Service: Endoscopy;  Laterality: N/A;  . CORONARY ARTERY BYPASS GRAFT  02/05/2007  . DOPPLER ECHOCARDIOGRAPHY  03/14/2013   EF 65-70%,trivial AI,mild-mod. MR,Mod. TR,mod. concentric hypertrophy  . EP IMPLANTABLE DEVICE N/A 08/22/2016   Procedure: Loop Recorder Removal;  Surgeon: Sanda Klein, MD;  Location: [place] CV LAB;  Service: Cardiovascular;  Laterality: N/A;  . ESOPHAGOGASTRODUODENOSCOPY N/A 11/24/2013   Procedure: ESOPHAGOGASTRODUODENOSCOPY (EGD);  Surgeon: Juanita Craver, MD;  [facility];  Service: Endoscopy;  Laterality: N/A;  . EYE SURGERY Bilateral   . KNEE ARTHROSCOPY Right 10/17/2013   Procedure: ARTHROSCOPY RIGHT KNEE;  Surgeon: [name], MD;  Location: [place] [facility];  Service: Orthopedics;  Laterality: Right;  . LOOP RECORDER IMPLANT  03/18/2013  . LOOP RECORDER IMPLANT N/A 03/18/2013   Procedure: LOOP RECORDER IMPLANT;  Surgeon: Sanda Klein, MD;  Location: [facility] CATH LAB;  Service: Cardiovascular;  Laterality: N/A;  . Nuclear Stress Test  11/30/2011   No ischemia  . Septal Myectomy    . TUBAL LIGATION     Family History  Problem Relation Age of  Onset  . Cancer Mother        breast  . Hypertension Mother   . Cancer Father        prostate  . Diabetes Sister   . Heart attack Sister   . Migraines Neg Hx    Social History   Tobacco Use  . Smoking status: Never Smoker  . Smokeless tobacco: Never Used  Substance Use Topics  . Alcohol use: No  . Drug use: No   ROS See pertinent in HPI  Blood pressure (!) 153/90, pulse 91, weight 142 lb 12.8 oz (64.8 kg). GENERAL: Well-developed, well-nourished female in no acute distress.  PELVIC: Normal external female genitalia. Vagina is pale pink and atrophic.  White lesion on vagina fourchette measuring 1 cm and vaginal mucosa underlying urethra measuring approximately 0.5 cm. Unchanged from previous in size. Normal discharge. Normal appearing cervix. Uterus is normal in size. No adnexal mass or tenderness. EXTREMITIES: No cyanosis, clubbing, or edema, 2+ distal pulses.  [name] 72 yo here for follow up on lichen sclerosus - Continue clobetasol cream. Refill provided - Normal screening mammogram this year - Follow up with PCP for routine health maintenance - RTC in 6 months

## 2018-09-16 DIAGNOSIS — M7060 Trochanteric bursitis, unspecified hip: Secondary | ICD-10-CM | POA: Insufficient documentation

## 2018-09-16 DIAGNOSIS — M19011 Primary osteoarthritis, right shoulder: Secondary | ICD-10-CM | POA: Insufficient documentation

## 2018-09-24 ENCOUNTER — Other Ambulatory Visit: Payer: Self-pay | Admitting: Allergy and Immunology

## 2018-09-30 ENCOUNTER — Emergency Department (HOSPITAL_COMMUNITY)
Admission: EM | Admit: 2018-09-30 | Discharge: 2018-09-30 | Disposition: A | Discharge status: Home / Self Care | Payer: Medicare Other | Attending: Emergency Medicine | Admitting: Emergency Medicine

## 2018-09-30 ENCOUNTER — Emergency Department (HOSPITAL_COMMUNITY): Payer: Medicare Other

## 2018-09-30 DIAGNOSIS — R079 Chest pain, unspecified: Secondary | ICD-10-CM | POA: Diagnosis present

## 2018-09-30 DIAGNOSIS — Z5321 Procedure and treatment not carried out due to patient leaving prior to being seen by health care provider: Secondary | ICD-10-CM | POA: Insufficient documentation

## 2018-09-30 LAB — BASIC METABOLIC PANEL
Anion gap: 10 (ref 5–15)
BUN: 8 mg/dL (ref 8–23)
CO2: 26 mmol/L (ref 22–32)
Calcium: 9.1 mg/dL (ref 8.9–10.3)
Chloride: 103 mmol/L (ref 98–111)
Creatinine, Ser: 0.89 mg/dL (ref 0.44–1.00)
GFR calc Af Amer: >60 mL/min (ref >60)
GFR calc non Af Amer: >60 mL/min (ref >60)
Glucose, Bld: 112 mg/dL — ABNORMAL HIGH (ref 70–99)
Potassium: 3.5 mmol/L (ref 3.5–5.1)
Sodium: 139 mmol/L (ref 135–145)

## 2018-09-30 LAB — CBC
HCT: 45 % (ref 36.0–46.0)
Hemoglobin: 13.9 g/dL (ref 12.0–15.0)
MCH: 27.9 pg (ref 26.0–34.0)
MCHC: 30.9 g/dL (ref 30.0–36.0)
MCV: 90.2 fL (ref 80.0–100.0)
Platelets: 287 10*3/uL (ref 150–400)
RBC: 4.99 MIL/uL (ref 3.87–5.11)
RDW: 13.2 % (ref 11.5–15.5)
WBC: 7.5 10*3/uL (ref 4.0–10.5)
nRBC: 0 % (ref 0.0–0.2)

## 2018-09-30 LAB — I-STAT TROPONIN, ED: Troponin i, poc: 0 ng/mL (ref 0.00–0.08)

## 2018-09-30 NOTE — ED Notes (Signed)
No answer for vitals recheck x2 

## 2018-09-30 NOTE — ED Triage Notes (Signed)
Pt endorses chest pain 1 week with tremors, fatigue. Pain is 0/10 currently. Pain is to left chest when it comes. Pt also reports hypertension.

## 2018-09-30 NOTE — ED Notes (Signed)
No answer for vital recheck 

## 2018-09-30 NOTE — ED Notes (Signed)
No answer for vitals recheck x3 

## 2018-11-21 ENCOUNTER — Other Ambulatory Visit: Payer: Self-pay | Admitting: Allergy and Immunology

## 2018-12-17 ENCOUNTER — Ambulatory Visit (INDEPENDENT_AMBULATORY_CARE_PROVIDER_SITE_OTHER): Payer: Medicare Other | Admitting: Medical

## 2018-12-17 ENCOUNTER — Encounter: Payer: Self-pay | Admitting: Physician Assistant

## 2018-12-17 VITALS — BP 130/78 | HR 86 | Ht 60.0 in | Wt 147.4 lb

## 2018-12-17 DIAGNOSIS — Z01810 Encounter for preprocedural cardiovascular examination: Secondary | ICD-10-CM

## 2018-12-17 DIAGNOSIS — E785 Hyperlipidemia, unspecified: Secondary | ICD-10-CM | POA: Diagnosis not present

## 2018-12-17 DIAGNOSIS — I447 Left bundle-branch block, unspecified: Secondary | ICD-10-CM

## 2018-12-17 DIAGNOSIS — I421 Obstructive hypertrophic cardiomyopathy: Secondary | ICD-10-CM

## 2018-12-17 DIAGNOSIS — I10 Essential (primary) hypertension: Secondary | ICD-10-CM

## 2018-12-17 NOTE — Progress Notes (Addendum)
Cardiology Office Note   Date:  12/17/2018   ID:  AIREL MAGADAN, DOB October 03, 1946, MRN [id number]  PCP:  Nolene Ebbs, MD  Cardiologist:  Pixie Casino, MD EP: None  No chief complaint on file.     History of Present Illness: Autumn Fitzgerald is a 73 y.o. female with PMH of HOCM s/p septal myectomy, HLD, depression, anxiety, and GERD who presents for preoperative assessment.   Autumn Fitzgerald was last seen by cardiology outpatient, Dr. Sallyanne Kuster, 07/2016 for routine follow-up. It was determined that her loop recorder (placed for syncopal episode) had reached end of life and decision was made to remove the device as no significant arrhythmias had occurred during the 3 year monitoring period. Her only other cardiac complaint at that time was some DOE, felt to be 2/2 diastolic dysfunction, though Autumn Fitzgerald was hesitant to trial diuretics at that time. No medication changes occurred at that visit. Her last ischemic evaluation was a NST11/2017 which revealed no evidence of ischemia. Echo 2016 showed EF 60-65%, G1DD, mild LVH, mild MR, and moderate MR.   Autumn Fitzgerald presents today with her son for preoperative assessment. Autumn Fitzgerald follows with Dr. Onnie Graham with [facility] for right shoulder pain and was planned for surgical repair of her impingement syndrome and rotator cuff tear on 12/13/18. Autumn Fitzgerald presented to an outpatient surgical facility, however per chart review, expressed significant concerns about having surgery outside of a hospital setting. Per the patient, Autumn Fitzgerald has been told in the past that, due to her cardiomyopathy, Autumn Fitzgerald should ONLY have surgery in a hospital setting where they are equipped to manage her potential cardiac complications. The patient was cleared for surgery by Dr. Einar Gip, however given confusions as to where it is appropriate for her to undergo surgery, Autumn Fitzgerald has requested a transfer of care back to Dr. Sallyanne Kuster at this time.  Autumn Fitzgerald reports being seen by Dr. Einar Gip last on 11/18/18, presumably for  preoperative assessment. At today's visit Autumn Fitzgerald has multiple vague complaints including chest discomfort which Autumn Fitzgerald could not elaborate on. Autumn Fitzgerald reports being limited in mobility by her ongoing right shoulder pain and states Autumn Fitzgerald is unable to walk 1-2 blocks 2/2 pain. Autumn Fitzgerald has chronic SOB/DOE which is unchanged. Autumn Fitzgerald reports abdominal fullness which is worse with eating and associated with abdominal cramping, for which Autumn Fitzgerald reports having an extensive GI work-up which was negative. Autumn Fitzgerald denies orthopnea, PND, LE edema, or syncope.    Past Medical History:  Diagnosis Date  . Arthritis   . Asthma   . Chest wall pain   . Depression   . GERD (gastroesophageal reflux disease)   . Head injury, closed, without LOC 02/2013   did not LOC per patient  . Headache   . HLD (hyperlipidemia)   . Hypertension    at times,  . Hyperthyroidism   . 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 Mental disorder   . Near syncope   . Palpitations   . Panic attacks     Past Surgical History:  Procedure Laterality Date  . CHOLECYSTECTOMY    . COLONOSCOPY N/A 11/24/2013   Procedure: COLONOSCOPY;  Surgeon: Juanita Craver, MD;  Location: [facility];  Service: Endoscopy;  Laterality: N/A;  . CORONARY ARTERY BYPASS GRAFT  02/05/2007  . DOPPLER ECHOCARDIOGRAPHY  03/14/2013   EF 65-70%,trivial AI,mild-mod. MR,Mod. TR,mod. concentric hypertrophy  . EP IMPLANTABLE DEVICE N/A 08/22/2016   Procedure: Loop Recorder Removal;  Surgeon: Sanda Klein, MD;  Location: [facility];  Service: Cardiovascular;  Laterality: N/A;  . ESOPHAGOGASTRODUODENOSCOPY N/A 11/24/2013   Procedure: ESOPHAGOGASTRODUODENOSCOPY (EGD);  Surgeon: Juanita Craver, MD;  Location: [facility];  Service: Endoscopy;  Laterality: N/A;  . EYE SURGERY Bilateral   . KNEE ARTHROSCOPY Right 10/17/2013   Procedure: ARTHROSCOPY RIGHT KNEE;  Surgeon: [name], MD;  Location: [facility];  Service: Orthopedics;  Laterality: Right;  . LOOP RECORDER IMPLANT   03/18/2013  . LOOP RECORDER IMPLANT N/A 03/18/2013   Procedure: LOOP RECORDER IMPLANT;  Surgeon: Sanda Klein, MD;  Location: [facility];  Service: Cardiovascular;  Laterality: N/A;  . Nuclear Stress Test  11/30/2011   No ischemia  . Septal Myectomy    . TUBAL LIGATION       Current Outpatient Medications  Medication Sig Dispense Refill  . acetaminophen (TYLENOL) 325 MG tablet Take 650 mg by mouth.    Marland Kitchen albuterol (PROVENTIL HFA;VENTOLIN HFA) 108 (90 Base) MCG/ACT inhaler INHALE TWO PUFFS INTO THE LUNGS EVERY 4 TO 6 HOURS AS NEEDED FOR COUGH OR WHEEZE 18 each 0  . atenolol (TENORMIN) 25 MG tablet Take 0.5 tablets (12.5 mg total) by mouth daily as needed (for high blood pressure). 45 tablet 3  . clobetasol ointment (TEMOVATE) 0.05 % Apply to affected area every night for 4 weeks, then every other day for 4 weeks and then twice a week for 4 weeks or until resolution. 30 g 5  . clonazePAM (KLONOPIN) 1 MG tablet Take 1 mg by mouth at bedtime.     Marland Kitchen DEXILANT 30 MG capsule     . ezetimibe-simvastatin (VYTORIN) 10-40 MG per tablet Take 0.5 tablets by mouth at bedtime.     . fluticasone (FLONASE) 50 MCG/ACT nasal spray 1 spray in each nostril 1-2 times daily 16 g 5  . levothyroxine (SYNTHROID) 50 MCG tablet Take 50 mcg by mouth daily.     . mometasone (ASMANEX) 220 MCG/INH inhaler Inhale 2 puffs into the lungs daily.    . Multiple Vitamin (MULTIVITAMIN) tablet Take 1 tablet by mouth daily.    Marland Kitchen XIIDRA 5 % SOLN      No current facility-administered medications for this visit.     Allergies:   Amoxicillin-pot clavulanate; Statins; and Temazepam    Social History:  The patient  reports that Autumn Fitzgerald has never smoked. Autumn Fitzgerald has never used smokeless tobacco. Autumn Fitzgerald reports that Autumn Fitzgerald does not drink alcohol or use drugs.   Family History:  The patient's family history includes Cancer in her father and mother; Diabetes in her sister; Heart attack in her sister; Hypertension in her mother.    ROS:  Please see  the history of present illness.   Otherwise, review of systems are positive for none.   All other systems are reviewed and negative.    PHYSICAL EXAM: VS:  BP 130/78   Pulse 86   Ht 5' (1.524 m)   Wt 147 lb 6.4 oz (66.9 kg)   BMI 28.79 kg/m  , BMI Body mass index is 28.79 kg/m. GEN: Well nourished, well developed, in no acute distress HEENT: sclera anicteric Neck: no JVD, carotid bruits, or masses Cardiac: RRR; no murmurs, rubs, or gallops,no edema  Respiratory:  clear to auscultation bilaterally, normal work of breathing GI: soft, obese, nontender, nondistended, + BS MS: no deformity or atrophy Skin: warm and dry, no rash Neuro:  Strength and sensation are intact Psych: anxious   EKG:  EKG is ordered today. The ekg ordered today demonstrates sinus rhythm with LBBB (chronic)   Recent  Labs: 09/30/2018: BUN 8; Creatinine, Ser 0.89; Hemoglobin 13.9; Platelets 287; Potassium 3.5; Sodium 139    Lipid Panel    Component Value Date/Time   CHOL 145 09/23/2008 2026   TRIG 94 09/23/2008 2026   HDL 53 09/23/2008 2026   CHOLHDL 2.7 Ratio 09/23/2008 2026   VLDL 19 09/23/2008 2026   LDLCALC 73 09/23/2008 2026      Wt Readings from Last 3 Encounters:  12/17/18 147 lb 6.4 oz (66.9 kg)  09/12/18 142 lb 12.8 oz (64.8 kg)  09/10/18 141 lb (64 kg)      Other studies Reviewed: Additional studies/ records that were reviewed today include:   Echocardiogram 2016: Study Conclusions  - Left ventricle: The cavity size was normal. Wall thickness was increased in a pattern of mild LVH. Systolic function was normal. The estimated ejection fraction was in the range of 60% to 65%. Doppler parameters are consistent with abnormal left ventricular relaxation (grade 1 diastolic dysfunction). - Mitral valve: There was mild regurgitation. - Right atrium: The atrium was mildly dilated. - Tricuspid valve: There was moderate regurgitation.  NST 10/2016:  The left ventricular  ejection fraction is hyperdynamic (>65%).  Nuclear stress EF: 74%.  There was no ST segment deviation noted during stress.  The study is normal.  This is a low risk study.    ASSESSMENT AND PLAN:  1. Preoperative assessment: patient presents at the request of Dr. Onnie Graham in preparation for upcoming right shoulder surgery. Per note from Dr. Onnie Graham 12/13/18 in care everywhere, patient was clared for surgery, however upon arrival to the surgical center, patient insisted that Autumn Fitzgerald was told in the past Autumn Fitzgerald should only undergo surgery in a hospital (inpatient) setting. While Autumn Fitzgerald does have a history of HOCM, Autumn Fitzgerald is s/p myotomy. No prior CAD, diabetes, CKD, or stroke history. Autumn Fitzgerald does report vague chest discomfort but is unable to elaborate further. Autumn Fitzgerald is also quite limited in mobility 2/2 right shoulder pain. Call placed to Dr. Irven Shelling office and was able to confirm last echo occurred 05/2018 and revealed EF 50%, G1DD, mild MR, and moderate TR. No recent stress testing. Dr. Irven Shelling office to send records. Last ischemic evaluation was a NST in 2017 which revealed no ischemia.  - Will check a NST given vague chest pain complaints  - Await echo report from Dr. Irven Shelling office - Further preoperative clearance to follow - will route final recommendations to Dr. Susie Cassette office when appropriate.   2. History of HOCM s/p myotomy: echo from 05/2018 requested from Dr. Irven Shelling office.  - Continue atenolol  3. HTN: BP stable today - Continue atenolol  4. HLD: no recent lipids. Records requested from Dr. Irven Shelling office. - Continue ezetimibe-simvastatin   Current medicines are reviewed at length with the patient today.  The patient does not have concerns regarding medicines.  The following changes have been made:  no change  Labs/ tests ordered today include:   Orders Placed This Encounter  Procedures  . MYOCARDIAL PERFUSION IMAGING  . EKG 12-Lead     Disposition:   FU with Dr. Sallyanne Kuster in 3  months  Signed, Abigail Butts, PA-C  12/17/2018 9:56 PM       ADDENDUM:  NST results from 12/24/18 reviewed and show no evidence of ischemia.    The left ventricular ejection fraction is hyperdynamic (>65%).  Nuclear stress EF: 80%.  There was no ST segment deviation noted during stress.  The study is normal.  This is a low risk study.  Normal pharmacologic nuclear stress test with no evidence for prior infarct or ischemia. Normal LVEF.   Based on these findings, Autumn Fitzgerald is deemed to be at an acceptable risk for surgery without further cardiac work-up. Will leave the location of the surgery (surgical center vs hospital setting) up the the comfort level of the surgeon/anesthesiologist, although no contraindication to either locations from a cardiac standpoint. Will route this note to Dr. Susie Cassette office to serve as cardiac clearance.   Abigail Butts, PA-C 12/26/18

## 2018-12-17 NOTE — Patient Instructions (Signed)
Medication Instructions:  The current medical regimen is effective;  continue present plan and medications.  If you need a refill on your cardiac medications before your next appointment, please call your pharmacy.   Testing/Procedures: Your physician has requested that you have an [place]. A cardiac stress test is a cardiological test that measures the heart's ability to respond to external stress in a controlled clinical environment. The stress response is induced by chemical For further information please visit [email or web address]. If you have questions or concerns about your appointment, you can call the Nuclear Lab at [phone].   Follow-Up: At [facility], you and your health needs are our priority.  As part of our continuing mission to provide you with exceptional heart care, we have created designated Provider Care Teams.  These Care Teams include your primary Cardiologist (physician) and Advanced Practice Providers (APPs -  Physician Assistants and Nurse Practitioners) who all work together to provide you with the care you need, when you need it. You will need a follow up appointment in 3 months.  Please call our office 2 months in advance to schedule this appointment.  You may see Dr.Croitoru  or one of the following Advanced Practice Providers on your designated Care Team: Almyra Deforest, [place] . Fabian Sharp, PA-C

## 2018-12-19 ENCOUNTER — Telehealth (HOSPITAL_COMMUNITY): Payer: Self-pay

## 2018-12-19 NOTE — Telephone Encounter (Signed)
Encounter complete. 

## 2018-12-21 NOTE — Progress Notes (Signed)
The location for the surgery is up to Dr. Onnie Graham and the Anesthesiology team MCr

## 2018-12-22 ENCOUNTER — Other Ambulatory Visit: Payer: Self-pay | Admitting: Allergy and Immunology

## 2018-12-24 ENCOUNTER — Ambulatory Visit (HOSPITAL_COMMUNITY)
Admission: RE | Admit: 2018-12-24 | Discharge: 2018-12-24 | Disposition: A | Discharge status: Home / Self Care | Payer: Medicare Other | Source: Ambulatory Visit | Attending: Cardiovascular Disease | Admitting: Cardiovascular Disease

## 2018-12-24 DIAGNOSIS — Z01810 Encounter for preprocedural cardiovascular examination: Secondary | ICD-10-CM | POA: Insufficient documentation

## 2018-12-24 LAB — MYOCARDIAL PERFUSION IMAGING
LV dias vol: 39 mL (ref 46–106)
LV sys vol: 8 mL
Peak HR: 114 {beats}/min
Rest HR: 78 {beats}/min
SDS: 3
SRS: 6
SSS: 9
TID: 1.12

## 2018-12-24 MED ORDER — TECHNETIUM TC 99M TETROFOSMIN IV KIT
9.4000 | PACK | Freq: Once | INTRAVENOUS | Status: AC | PRN
  Administered 2018-12-24: 9.4 via INTRAVENOUS
  Filled 2018-12-24: qty 10

## 2018-12-24 MED ORDER — TECHNETIUM TC 99M TETROFOSMIN IV KIT
28.6000 | PACK | Freq: Once | INTRAVENOUS | Status: AC | PRN
  Administered 2018-12-24: 28.6 via INTRAVENOUS
  Filled 2018-12-24: qty 29

## 2018-12-24 MED ORDER — REGADENOSON 0.4 MG/5ML IV SOLN
0.4000 mg | Freq: Once | INTRAVENOUS | Status: AC
  Administered 2018-12-24: 0.4 mg via INTRAVENOUS

## 2018-12-24 MED ORDER — AMINOPHYLLINE 25 MG/ML IV SOLN: 75.0000 mg | Freq: Once | INTRAVENOUS | Status: DC

## 2018-12-26 ENCOUNTER — Telehealth: Payer: Self-pay

## 2018-12-26 NOTE — Telephone Encounter (Signed)
Patient states she needs a letter to send to Dr Onnie Graham (across the hall at [facility]) stating that Dr Sallyanne Kuster told her to have her procedure at the hospital. Patient states she is scared to have procedure at surgical center because they do not have enough equipment and she does not want to die. Patient states she has all her procedures at the hospital. She would like for Dr Sallyanne Kuster to have this noted in her chart. Patient states she can not wait until her next visit with Dr Sallyanne Kuster and that she needs the clearance letter as soon as possible. Patient was recently seen by Roby Lofts. Advised patient that I would send Dr Sallyanne Kuster and Daleen Snook a message and get back to her. She voiced understanding.

## 2019-01-01 NOTE — Telephone Encounter (Signed)
Patient notified of cardiology recommendations regarding preoperative assessment. She understands that the location of the surgery is up to Dr. Onnie Graham at this point and there is no contraindications from a cardiac standpoint to surgical center vs hospital setting. She plans to reach out to Dr. Susie Cassette office to follow-up on rescheduling her surgery.   Abigail Butts, PA-C 01/01/19

## 2019-01-01 NOTE — Telephone Encounter (Signed)
Low risk for planned surgery. The decision regarding the location of surgery is up to her surgery team, not me. MCr

## 2019-01-14 NOTE — Patient Instructions (Addendum)
Autumn Fitzgerald  05/15/1946     Your procedure is scheduled on:  01-16-2019   Report to [facility] Main  Entrance,  Report to admitting at  10:45 AM    Call this number if you have problems the morning of surgery [phone]       Remember: Do not eat food or drink liquids :After Midnight.                                      BRUSH YOUR TEETH MORNING OF SURGERY AND RINSE YOUR MOUTH OUT, NO CHEWING GUM CANDY OR MINTS.        Take these medicines the morning of surgery with A SIP OF WATER:    Levothyroxine, Albuterol inhaler as needed and bring with you day of surgery                                   You may not have any metal on your body including hair pins and               piercings  Do not wear jewelry, make-up, lotions, powders or perfumes, deodorant              Do not wear nail polish.  Do not shave  48 hours prior to surgery.       Do not bring valuables to the hospital. [place].  Contacts, dentures or bridgework may not be worn into surgery.  Leave suitcase in the car. After surgery it may be brought to your room.     Patients discharged the day of surgery will not be allowed to drive home. IF YOU ARE HAVING SURGERY AND GOING HOME THE SAME DAY, YOU MUST HAVE AN ADULT TO DRIVE YOU HOME AND BE WITH YOU FOR 24 HOURS. YOU MAY GO HOME BY TAXI OR UBER OR ORTHERWISE, BUT AN ADULT MUST ACCOMPANY YOU HOME AND STAY WITH YOU FOR 24 HOURS.    Name and phone number of your driver:    _____________________________________________________________________             [facility] - Preparing for Surgery Before surgery, you can play an important role.  Because skin is not sterile, your skin needs to be as free of germs as possible.  You can reduce the number of germs on your skin by washing with CHG (chlorahexidine gluconate) soap before surgery.  CHG is an antiseptic cleaner which kills  germs and bonds with the skin to continue killing germs even after washing. Please DO NOT use if you have an allergy to CHG or antibacterial soaps.  If your skin becomes reddened/irritated stop using the CHG and inform your nurse when you arrive at Short Stay. Do not shave (including legs and underarms) for at least 48 hours prior to the first CHG shower.  You may shave your face/neck. Please follow these instructions carefully:  1.  Shower with CHG Soap the night before surgery and the  morning of Surgery.  2.  If you choose to wash your hair, wash your hair first as usual with your  normal  shampoo.  3.  After you shampoo, rinse your hair and body thoroughly to remove the  shampoo.                            4.  Use CHG as you would any other liquid soap.  You can apply chg directly  to the skin and wash                       Gently with a scrungie or clean washcloth.  5.  Apply the CHG Soap to your body ONLY FROM THE NECK DOWN.   Do not use on face/ open                           Wound or open sores. Avoid contact with eyes, ears mouth and genitals (private parts).                       Wash face,  Genitals (private parts) with your normal soap.             6.  Wash thoroughly, paying special attention to the area where your surgery  will be performed.  7.  Thoroughly rinse your body with warm water from the neck down.  8.  DO NOT shower/wash with your normal soap after using and rinsing off  the CHG Soap.             9.  Pat yourself dry with a clean towel.            10.  Wear clean pajamas.            11.  Place clean sheets on your bed the night of your first shower and do not  sleep with pets. Day of Surgery : Do not apply any lotions/deodorants the morning of surgery.  Please wear clean clothes to the hospital/surgery center.  FAILURE TO FOLLOW THESE INSTRUCTIONS MAY RESULT IN THE CANCELLATION OF YOUR SURGERY PATIENT SIGNATURE_________________________________  NURSE  SIGNATURE__________________________________  ________________________________________________________________________   Adam Phenix  An incentive spirometer is a tool that can help keep your lungs clear and active. This tool measures how well you are filling your lungs with each breath. Taking long deep breaths may help reverse or decrease the chance of developing breathing (pulmonary) problems (especially infection) following:  A long period of time when you are unable to move or be active. BEFORE THE PROCEDURE   If the spirometer includes an indicator to show your best effort, your nurse or respiratory therapist will set it to a desired goal.  If possible, sit up straight or lean slightly forward. Try not to slouch.  Hold the incentive spirometer in an upright position. INSTRUCTIONS FOR USE  1. Sit on the edge of your bed if possible, or sit up as far as you can in bed or on a chair. 2. Hold the incentive spirometer in an upright position. 3. Breathe out normally. 4. Place the mouthpiece in your mouth and seal your lips tightly around it. 5. Breathe in slowly and as deeply as possible, raising the piston or the ball toward the top of the column. 6. Hold your breath for 3-5 seconds or for as long as possible. Allow the piston or ball to fall to the bottom of the column. 7. Remove the mouthpiece from your mouth and breathe out normally. 8. Rest for a few seconds  and repeat Steps 1 through 7 at least 10 times every 1-2 hours when you are awake. Take your time and take a few normal breaths between deep breaths. 9. The spirometer may include an indicator to show your best effort. Use the indicator as a goal to work toward during each repetition. 10. After each set of 10 deep breaths, practice coughing to be sure your lungs are clear. If you have an incision (the cut made at the time of surgery), support your incision when coughing by placing a pillow or rolled up towels firmly  against it. Once you are able to get out of bed, walk around indoors and cough well. You may stop using the incentive spirometer when instructed by your caregiver.  RISKS AND COMPLICATIONS  Take your time so you do not get dizzy or light-headed.  If you are in pain, you may need to take or ask for pain medication before doing incentive spirometry. It is harder to take a deep breath if you are having pain. AFTER USE  Rest and breathe slowly and easily.  It can be helpful to keep track of a log of your progress. Your caregiver can provide you with a simple table to help with this. If you are using the spirometer at home, follow these instructions: [place] IF:   You are having difficultly using the spirometer.  You have trouble using the spirometer as often as instructed.  Your pain medication is not giving enough relief while using the spirometer.  You develop fever of 100.5 F (38.1 C) or higher. SEEK IMMEDIATE MEDICAL CARE IF:   You cough up bloody sputum that had not been present before.  You develop fever of 102 F (38.9 C) or greater.  You develop worsening pain at or near the incision site. MAKE SURE YOU:   Understand these instructions.  Will watch your condition.  Will get help right away if you are not doing well or get worse. Document Released: 04/09/2007 Document Revised: 02/19/2012 Document Reviewed: 06/10/2007 [facility] Patient Information 2014 [place], [place].   ________________________________________________________________________

## 2019-01-15 ENCOUNTER — Encounter (HOSPITAL_COMMUNITY)
Admission: RE | Admit: 2019-01-15 | Discharge: 2019-01-15 | Disposition: A | Discharge status: Home / Self Care | Payer: Medicare Other | Source: Ambulatory Visit | Attending: Orthopedic Surgery | Admitting: Orthopedic Surgery

## 2019-01-15 ENCOUNTER — Encounter (HOSPITAL_COMMUNITY): Payer: Self-pay

## 2019-01-15 ENCOUNTER — Other Ambulatory Visit: Payer: Self-pay

## 2019-01-15 DIAGNOSIS — I081 Rheumatic disorders of both mitral and tricuspid valves: Secondary | ICD-10-CM

## 2019-01-15 DIAGNOSIS — M19011 Primary osteoarthritis, right shoulder: Secondary | ICD-10-CM | POA: Diagnosis not present

## 2019-01-15 DIAGNOSIS — M7501 Adhesive capsulitis of right shoulder: Secondary | ICD-10-CM | POA: Diagnosis not present

## 2019-01-15 DIAGNOSIS — F410 Panic disorder [episodic paroxysmal anxiety] without agoraphobia: Secondary | ICD-10-CM | POA: Diagnosis not present

## 2019-01-15 DIAGNOSIS — Z803 Family history of malignant neoplasm of breast: Secondary | ICD-10-CM | POA: Diagnosis not present

## 2019-01-15 DIAGNOSIS — Z9049 Acquired absence of other specified parts of digestive tract: Secondary | ICD-10-CM | POA: Diagnosis not present

## 2019-01-15 DIAGNOSIS — R7303 Prediabetes: Secondary | ICD-10-CM | POA: Insufficient documentation

## 2019-01-15 DIAGNOSIS — R002 Palpitations: Secondary | ICD-10-CM | POA: Diagnosis not present

## 2019-01-15 DIAGNOSIS — Z8042 Family history of malignant neoplasm of prostate: Secondary | ICD-10-CM | POA: Diagnosis not present

## 2019-01-15 DIAGNOSIS — I421 Obstructive hypertrophic cardiomyopathy: Secondary | ICD-10-CM | POA: Diagnosis not present

## 2019-01-15 DIAGNOSIS — Z951 Presence of aortocoronary bypass graft: Secondary | ICD-10-CM

## 2019-01-15 DIAGNOSIS — M25811 Other specified joint disorders, right shoulder: Secondary | ICD-10-CM

## 2019-01-15 DIAGNOSIS — E039 Hypothyroidism, unspecified: Secondary | ICD-10-CM

## 2019-01-15 DIAGNOSIS — R9431 Abnormal electrocardiogram [ECG] [EKG]: Secondary | ICD-10-CM

## 2019-01-15 DIAGNOSIS — F329 Major depressive disorder, single episode, unspecified: Secondary | ICD-10-CM | POA: Insufficient documentation

## 2019-01-15 DIAGNOSIS — K219 Gastro-esophageal reflux disease without esophagitis: Secondary | ICD-10-CM

## 2019-01-15 DIAGNOSIS — I10 Essential (primary) hypertension: Secondary | ICD-10-CM

## 2019-01-15 DIAGNOSIS — Z9842 Cataract extraction status, left eye: Secondary | ICD-10-CM | POA: Diagnosis not present

## 2019-01-15 DIAGNOSIS — Z79899 Other long term (current) drug therapy: Secondary | ICD-10-CM | POA: Diagnosis not present

## 2019-01-15 DIAGNOSIS — M7541 Impingement syndrome of right shoulder: Secondary | ICD-10-CM | POA: Diagnosis present

## 2019-01-15 DIAGNOSIS — Z01812 Encounter for preprocedural laboratory examination: Secondary | ICD-10-CM | POA: Insufficient documentation

## 2019-01-15 DIAGNOSIS — Z79890 Hormone replacement therapy (postmenopausal): Secondary | ICD-10-CM | POA: Insufficient documentation

## 2019-01-15 DIAGNOSIS — J4530 Mild persistent asthma, uncomplicated: Secondary | ICD-10-CM

## 2019-01-15 DIAGNOSIS — I447 Left bundle-branch block, unspecified: Secondary | ICD-10-CM | POA: Diagnosis not present

## 2019-01-15 DIAGNOSIS — M75101 Unspecified rotator cuff tear or rupture of right shoulder, not specified as traumatic: Secondary | ICD-10-CM | POA: Insufficient documentation

## 2019-01-15 DIAGNOSIS — F419 Anxiety disorder, unspecified: Secondary | ICD-10-CM | POA: Diagnosis not present

## 2019-01-15 DIAGNOSIS — X58XXXA Exposure to other specified factors, initial encounter: Secondary | ICD-10-CM | POA: Diagnosis not present

## 2019-01-15 DIAGNOSIS — Z9841 Cataract extraction status, right eye: Secondary | ICD-10-CM | POA: Diagnosis not present

## 2019-01-15 DIAGNOSIS — S43431A Superior glenoid labrum lesion of right shoulder, initial encounter: Secondary | ICD-10-CM | POA: Diagnosis not present

## 2019-01-15 DIAGNOSIS — E785 Hyperlipidemia, unspecified: Secondary | ICD-10-CM | POA: Diagnosis not present

## 2019-01-15 HISTORY — DX: Presence of spectacles and contact lenses: Z97.3

## 2019-01-15 HISTORY — DX: Unspecified glaucoma: H40.9

## 2019-01-15 HISTORY — DX: Prediabetes: R73.03

## 2019-01-15 HISTORY — DX: Left bundle-branch block, unspecified: I44.7

## 2019-01-15 HISTORY — DX: Personal history of other specified conditions: Z87.898

## 2019-01-15 HISTORY — DX: Nausea with vomiting, unspecified: R11.2

## 2019-01-15 HISTORY — DX: Other specified postprocedural states: Z98.890

## 2019-01-15 HISTORY — DX: Hypothyroidism, unspecified: E03.9

## 2019-01-15 HISTORY — DX: Mild persistent asthma, uncomplicated: J45.30

## 2019-01-15 LAB — BASIC METABOLIC PANEL
Anion gap: 7 (ref 5–15)
BUN: 16 mg/dL (ref 8–23)
CO2: 25 mmol/L (ref 22–32)
Calcium: 8.8 mg/dL — ABNORMAL LOW (ref 8.9–10.3)
Chloride: 108 mmol/L (ref 98–111)
Creatinine, Ser: 0.74 mg/dL (ref 0.44–1.00)
GFR calc Af Amer: >60 mL/min (ref >60)
GFR calc non Af Amer: >60 mL/min (ref >60)
Glucose, Bld: 128 mg/dL — ABNORMAL HIGH (ref 70–99)
Potassium: 3.5 mmol/L (ref 3.5–5.1)
Sodium: 140 mmol/L (ref 135–145)

## 2019-01-15 LAB — CBC
HCT: 42.6 % (ref 36.0–46.0)
Hemoglobin: 13.5 g/dL (ref 12.0–15.0)
MCH: 27.8 pg (ref 26.0–34.0)
MCHC: 31.7 g/dL (ref 30.0–36.0)
MCV: 87.7 fL (ref 80.0–100.0)
Platelets: 262 10*3/uL (ref 150–400)
RBC: 4.86 MIL/uL (ref 3.87–5.11)
RDW: 13.5 % (ref 11.5–15.5)
WBC: 6.1 10*3/uL (ref 4.0–10.5)
nRBC: 0 % (ref 0.0–0.2)

## 2019-01-15 LAB — HEMOGLOBIN A1C
Hgb A1c MFr Bld: 6.1 % — ABNORMAL HIGH (ref 4.8–5.6)
Mean Plasma Glucose: 128.37 mg/dL

## 2019-01-15 NOTE — Progress Notes (Signed)
Late entry:  Chart given to anesthesia for review and to be assessed by Konrad Felix PA today after PAT visit.  Autumn Fitzgerald has already written progress note in epic.  EKG dated 12-16-2018 in epic. Chest Xray dated 09-30-2018 in epic.  Nuclear stress test dated 12-24-2018 in epic.  Last cardiology office visit dated 12-17-2018, dr hilty, in epic.  Addendum to this note dated 12-26-2018 in epic , pt was given surgical clearance.

## 2019-01-15 NOTE — Progress Notes (Signed)
Anesthesia Chart Review   Case:  [id number] Date/Time:  01/16/19 1226   Procedure:  Right shoulder arthroscopy with subacromial decompression, distal clavicle resection, rotator cuff repair (Right )   Anesthesia type:  General   Pre-op diagnosis:  right shoulder impingement, acromioclavicular osteoarthritis, rotator cuff tear   Location:  [facility] ROOM 07 / [facility]   Surgeon:  Justice Britain, MD      DISCUSSION: 72 yo never smoker with h/o PONV, HLD, anxiety, depression, asthma, pre-diabetic, GERD, LBBB, HTN, hypothyroidism, right shoulder impingement, rotator cuff tear, AC joint OA scheduled for above surgery on 01/16/19 with Dr. Justice Britain.   Pt last seen by cardiology on 12/17/18, seen by Roby Lofts, PA-C.  Stress test 12/24/18 low risk study without ischemia.  Per Roby Lofts, PA-C on results, "At this point she can be cleared for surgery without further cardiac work-up.  Her case was discussed with Dr. Sallyanne Kuster and as we talked about at her visit, the decision for where a surgery occurs is up to the anesthesiologist and the surgeon. From a cardiac standpoint there is no reason she cannot have her procedure at either a surgical center or in a hospital setting."  Pt can proceed with planned procedure barring acute status change.  VS: BP 123/79 (BP Location: Right Arm)   Pulse 71   Temp 36.7 C (Oral)   Resp 18   Ht 5' (1.524 m)   Wt 65.5 kg   SpO2 98%   BMI 28.22 kg/m   PROVIDERS: Nolene Ebbs, MD is PCP    LABS: Labs reviewed: Acceptable for surgery. (all labs ordered are listed, but only abnormal results are displayed)  Labs Reviewed  CBC  BASIC METABOLIC PANEL  HEMOGLOBIN A1C     IMAGES: Chest Xray 09/30/18 FINDINGS: Stable normal cardiac silhouette given projection and technique. Post median sternotomy with wires in alignment. Right upper quadrant cholecystectomy surgical clips. No consolidation, effusion, pneumothorax. Bones are unremarkable  IMPRESSION: No acute  pulmonary process identified.  EKG: 12/16/2018 Rate 86 bpm Normal sinus rhythm Possible left atrial enlargement Left bundle branch block Abnormal ECG   CV: Echo 12/28/14 Study Conclusions  - Left ventricle: The cavity size was normal. Wall thickness was increased in a pattern of mild LVH. Systolic function was normal. The estimated ejection fraction was in the range of 60% to 65%. Doppler parameters are consistent with abnormal left ventricular relaxation (grade 1 diastolic dysfunction). - Mitral valve: There was mild regurgitation. - Right atrium: The atrium was mildly dilated. - Tricuspid valve: There was moderate regurgitation.  Stress Test 12/24/2018  The left ventricular ejection fraction is hyperdynamic (>65%).  Nuclear stress EF: 80%.  There was no ST segment deviation noted during stress.  The study is normal.  This is a low risk study.   Normal pharmacologic nuclear stress test with no evidence for prior infarct or ischemia. Normal LVEF. Past Medical History:  Diagnosis Date  . Arthritis   . Chest wall pain   . Depression   . GERD (gastroesophageal reflux disease)   . Glaucoma, both eyes   . Head injury, closed, without LOC 02/2013   did not LOC per patient  . Headache   . History of syncope 2014   near syncope,  loop recorder placed and explanted 2017  . HLD (hyperlipidemia)   . Hypertension    "at times"    . 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 Hypothyroidism    followed by pcp  . LBBB (left bundle branch  block)   . Mild persistent allergic asthma   . Near syncope   . Palpitations   . Panic attacks   . PONV (postoperative nausea and vomiting)   . Pre-diabetes   . Wears glasses     Past Surgical History:  Procedure Laterality Date  . CARDIAC SURGERY  02/05/2007   in [place]  for HOCM  . CATARACT EXTRACTION W/ INTRAOCULAR LENS  IMPLANT, BILATERAL  right 07-23-2017;  left 10-15-2017  . COLONOSCOPY  N/A 11/24/2013   Procedure: COLONOSCOPY;  Surgeon: Juanita Craver, MD;  Location: [facility];  Service: Endoscopy;  Laterality: N/A;  . CORONARY ARTERY BYPASS GRAFT  02/05/2007  . DOPPLER ECHOCARDIOGRAPHY  03/14/2013   EF 65-70%,trivial AI,mild-mod. MR,Mod. TR,mod. concentric hypertrophy  . EP IMPLANTABLE DEVICE N/A 08/22/2016   Procedure: Loop Recorder Removal;  Surgeon: Sanda Klein, MD;  Location: [facility];  Service: Cardiovascular;  Laterality: N/A;  . ESOPHAGOGASTRODUODENOSCOPY N/A 11/24/2013   Procedure: ESOPHAGOGASTRODUODENOSCOPY (EGD);  Surgeon: Juanita Craver, MD;  [facility]: [facility];  Service: Endoscopy;  Laterality: N/A;  . GLAUCOMA SURGERY Bilateral 2019   "release pressure  . KNEE ARTHROSCOPY Right 10/17/2013   Procedure: ARTHROSCOPY RIGHT KNEE;  Surgeon: [name], MD;  Location: [facility];  Service: Orthopedics;  Laterality: Right;  . LAPAROSCOPIC CHOLECYSTECTOMY  02-03-2010   dr d blackman  @[facility]   . LOOP RECORDER IMPLANT  03/18/2013  . LOOP RECORDER IMPLANT N/A 03/18/2013   Procedure: LOOP RECORDER IMPLANT;  Surgeon: Sanda Klein, MD;  Location: [facility];  Service: Cardiovascular;  Laterality: N/A;  . TUBAL LIGATION  yrs ago    MEDICATIONS: . acetaminophen (TYLENOL) 500 MG tablet  . albuterol (PROVENTIL HFA;VENTOLIN HFA) 108 (90 Base) MCG/ACT inhaler  . atenolol (TENORMIN) 25 MG tablet  . Cholecalciferol (VITAMIN D) 50 MCG (2000 UT) tablet  . clobetasol ointment (TEMOVATE) 0.05 %  . clonazePAM (KLONOPIN) 1 MG tablet  . DEXILANT 30 MG capsule  . ezetimibe-simvastatin (VYTORIN) 10-40 MG per tablet  . fluticasone (FLONASE) 50 MCG/ACT nasal spray  . Glucosamine-Chondroitin (OSTEO BI-FLEX REGULAR STRENGTH PO)  . levothyroxine (SYNTHROID) 50 MCG tablet  . Multiple Vitamin (MULTIVITAMIN) tablet  . XIIDRA 5 % SOLN   No current facility-administered medications for this encounter.      Maia Plan [facility] Pre-Surgical Testing [phone] 01/15/19  2:48 PM

## 2019-01-15 NOTE — Anesthesia Preprocedure Evaluation (Addendum)
Anesthesia Evaluation  Patient identified by MRN, date of birth, ID band Patient awake    Reviewed: Allergy & Precautions, NPO status , Patient's Chart, lab work & pertinent test results  History of Anesthesia Complications (+) PONV and history of anesthetic complications  Airway Mallampati: II  TM Distance: >3 FB Neck ROM: Full    Dental no notable dental hx. (+) Dental Advisory Given   Pulmonary asthma ,    Pulmonary exam normal        Cardiovascular hypertension, Pt. on medications + CABG  Normal cardiovascular exam  Echo 12/28/14 Study Conclusions  - Left ventricle: The cavity size was normal. Wall thickness was increased in a pattern of mild LVH. Systolic function was normal. The estimated ejection fraction was in the range of 60% to 65%. Doppler parameters are consistent with abnormal left ventricular relaxation (grade 1 diastolic dysfunction). - Mitral valve: There was mild regurgitation. - Right atrium: The atrium was mildly dilated. - Tricuspid valve: There was moderate regurgitation.  Stress Test 12/24/2018  The left ventricular ejection fraction is hyperdynamic (>65%).  Nuclear stress EF: 80%.  There was no ST segment deviation noted during stress.  The study is normal.  This is a low risk study.  Normal pharmacologic nuclear stress test with no evidence for prior infarct or ischemia. Normal LVEF.   Neuro/Psych PSYCHIATRIC DISORDERS Anxiety Depression    GI/Hepatic Neg liver ROS, GERD  ,  Endo/Other  Hypothyroidism   Renal/GU negative Renal ROS     Musculoskeletal   Abdominal   Peds  Hematology   Anesthesia Other Findings   Reproductive/Obstetrics                          Anesthesia Physical Anesthesia Plan  ASA: III  Anesthesia Plan: General   Post-op Pain Management:  Regional for Post-op pain   Induction: Intravenous  PONV Risk Score and Plan: 4  or greater and Ondansetron, Dexamethasone, Scopolamine patch - Pre-op and Diphenhydramine  Airway Management Planned: Oral ETT  Additional Equipment:   Intra-op Plan:   Post-operative Plan: Extubation in OR  Informed Consent: I have reviewed the patients History and Physical, chart, labs and discussed the procedure including the risks, benefits and alternatives for the proposed anesthesia with the patient or authorized representative who has indicated his/her understanding and acceptance.     Dental advisory given  Plan Discussed with: CRNA and Anesthesiologist  Anesthesia Plan Comments: (See PST note 01/15/19, Konrad Felix, PA-C Pt developed N/V after sedation for the ISB.  Clear gastric content, with no particulate noted.  Plan to proceed with surgery.  )     Anesthesia Quick Evaluation

## 2019-01-16 ENCOUNTER — Ambulatory Visit (HOSPITAL_COMMUNITY): Payer: Medicare Other | Admitting: Anesthesiology

## 2019-01-16 ENCOUNTER — Ambulatory Visit (HOSPITAL_COMMUNITY)
Admission: RE | Admit: 2019-01-16 | Discharge: 2019-01-16 | Disposition: A | Discharge status: Home / Self Care | Payer: Medicare Other | Source: Ambulatory Visit | Attending: Orthopedic Surgery | Admitting: Orthopedic Surgery

## 2019-01-16 ENCOUNTER — Encounter (HOSPITAL_COMMUNITY)
Admission: RE | Disposition: A | Discharge status: Home / Self Care | Payer: Self-pay | Source: Ambulatory Visit | Attending: Orthopedic Surgery

## 2019-01-16 ENCOUNTER — Encounter (HOSPITAL_COMMUNITY): Payer: Self-pay | Admitting: *Deleted

## 2019-01-16 ENCOUNTER — Ambulatory Visit (HOSPITAL_COMMUNITY): Payer: Medicare Other | Admitting: Physician Assistant

## 2019-01-16 DIAGNOSIS — I421 Obstructive hypertrophic cardiomyopathy: Secondary | ICD-10-CM | POA: Insufficient documentation

## 2019-01-16 DIAGNOSIS — M19011 Primary osteoarthritis, right shoulder: Secondary | ICD-10-CM | POA: Insufficient documentation

## 2019-01-16 DIAGNOSIS — S43431A Superior glenoid labrum lesion of right shoulder, initial encounter: Secondary | ICD-10-CM | POA: Insufficient documentation

## 2019-01-16 DIAGNOSIS — Z7951 Long term (current) use of inhaled steroids: Secondary | ICD-10-CM | POA: Insufficient documentation

## 2019-01-16 DIAGNOSIS — F410 Panic disorder [episodic paroxysmal anxiety] without agoraphobia: Secondary | ICD-10-CM | POA: Insufficient documentation

## 2019-01-16 DIAGNOSIS — I447 Left bundle-branch block, unspecified: Secondary | ICD-10-CM | POA: Insufficient documentation

## 2019-01-16 DIAGNOSIS — M7541 Impingement syndrome of right shoulder: Secondary | ICD-10-CM | POA: Insufficient documentation

## 2019-01-16 DIAGNOSIS — Z79899 Other long term (current) drug therapy: Secondary | ICD-10-CM | POA: Insufficient documentation

## 2019-01-16 DIAGNOSIS — M25811 Other specified joint disorders, right shoulder: Secondary | ICD-10-CM | POA: Insufficient documentation

## 2019-01-16 DIAGNOSIS — R002 Palpitations: Secondary | ICD-10-CM | POA: Insufficient documentation

## 2019-01-16 DIAGNOSIS — Z8249 Family history of ischemic heart disease and other diseases of the circulatory system: Secondary | ICD-10-CM | POA: Insufficient documentation

## 2019-01-16 DIAGNOSIS — X58XXXA Exposure to other specified factors, initial encounter: Secondary | ICD-10-CM | POA: Insufficient documentation

## 2019-01-16 DIAGNOSIS — J4530 Mild persistent asthma, uncomplicated: Secondary | ICD-10-CM | POA: Insufficient documentation

## 2019-01-16 DIAGNOSIS — Z9842 Cataract extraction status, left eye: Secondary | ICD-10-CM | POA: Insufficient documentation

## 2019-01-16 DIAGNOSIS — I10 Essential (primary) hypertension: Secondary | ICD-10-CM | POA: Insufficient documentation

## 2019-01-16 DIAGNOSIS — K219 Gastro-esophageal reflux disease without esophagitis: Secondary | ICD-10-CM | POA: Insufficient documentation

## 2019-01-16 DIAGNOSIS — R7303 Prediabetes: Secondary | ICD-10-CM | POA: Insufficient documentation

## 2019-01-16 DIAGNOSIS — M75101 Unspecified rotator cuff tear or rupture of right shoulder, not specified as traumatic: Secondary | ICD-10-CM

## 2019-01-16 DIAGNOSIS — Z9049 Acquired absence of other specified parts of digestive tract: Secondary | ICD-10-CM | POA: Insufficient documentation

## 2019-01-16 DIAGNOSIS — Z803 Family history of malignant neoplasm of breast: Secondary | ICD-10-CM | POA: Insufficient documentation

## 2019-01-16 DIAGNOSIS — F419 Anxiety disorder, unspecified: Secondary | ICD-10-CM | POA: Insufficient documentation

## 2019-01-16 DIAGNOSIS — E039 Hypothyroidism, unspecified: Secondary | ICD-10-CM | POA: Insufficient documentation

## 2019-01-16 DIAGNOSIS — M7501 Adhesive capsulitis of right shoulder: Secondary | ICD-10-CM | POA: Diagnosis not present

## 2019-01-16 DIAGNOSIS — Z9841 Cataract extraction status, right eye: Secondary | ICD-10-CM | POA: Insufficient documentation

## 2019-01-16 DIAGNOSIS — F329 Major depressive disorder, single episode, unspecified: Secondary | ICD-10-CM | POA: Insufficient documentation

## 2019-01-16 DIAGNOSIS — E785 Hyperlipidemia, unspecified: Secondary | ICD-10-CM | POA: Insufficient documentation

## 2019-01-16 DIAGNOSIS — Z833 Family history of diabetes mellitus: Secondary | ICD-10-CM | POA: Insufficient documentation

## 2019-01-16 DIAGNOSIS — I081 Rheumatic disorders of both mitral and tricuspid valves: Secondary | ICD-10-CM | POA: Insufficient documentation

## 2019-01-16 DIAGNOSIS — Z8042 Family history of malignant neoplasm of prostate: Secondary | ICD-10-CM | POA: Insufficient documentation

## 2019-01-16 HISTORY — PX: SHOULDER ARTHROSCOPY WITH ROTATOR CUFF REPAIR: SHX5685

## 2019-01-16 LAB — NO BLOOD PRODUCTS

## 2019-01-16 SURGERY — ARTHROSCOPY, SHOULDER, WITH ROTATOR CUFF REPAIR
Anesthesia: General | Site: Shoulder | Laterality: Right

## 2019-01-16 MED ORDER — SUCCINYLCHOLINE CHLORIDE 20 MG/ML IJ SOLN
INTRAMUSCULAR | Status: DC | PRN
  Administered 2019-01-16: 120 mg via INTRAVENOUS

## 2019-01-16 MED ORDER — ONDANSETRON HCL 4 MG/2ML IJ SOLN
INTRAMUSCULAR | Status: AC
  Filled 2019-01-16: qty 2

## 2019-01-16 MED ORDER — PROPOFOL 10 MG/ML IV BOLUS
INTRAVENOUS | Status: DC | PRN
  Administered 2019-01-16: 160 mg via INTRAVENOUS

## 2019-01-16 MED ORDER — BUPIVACAINE LIPOSOME 1.3 % IJ SUSP
INTRAMUSCULAR | Status: DC | PRN
  Administered 2019-01-16: 10 mL via PERINEURAL

## 2019-01-16 MED ORDER — SCOPOLAMINE 1 MG/3DAYS TD PT72
1.0000 | MEDICATED_PATCH | TRANSDERMAL | Status: DC
  Administered 2019-01-16: 1.5 mg via TRANSDERMAL
  Filled 2019-01-16: qty 1

## 2019-01-16 MED ORDER — CHLORHEXIDINE GLUCONATE 4 % EX LIQD: 60.0000 mL | Freq: Once | CUTANEOUS | Status: DC

## 2019-01-16 MED ORDER — ACETAMINOPHEN 500 MG PO TABS
1000.0000 mg | ORAL_TABLET | Freq: Once | ORAL | Status: DC
  Filled 2019-01-16: qty 2

## 2019-01-16 MED ORDER — CYCLOBENZAPRINE HCL 5 MG PO TABS: 5.0000 mg | ORAL_TABLET | Freq: Three times a day (TID) | ORAL | 0 refills | Status: DC | PRN

## 2019-01-16 MED ORDER — LIDOCAINE 2% (20 MG/ML) 5 ML SYRINGE
INTRAMUSCULAR | Status: DC | PRN
  Administered 2019-01-16: 60 mg via INTRAVENOUS

## 2019-01-16 MED ORDER — LACTATED RINGERS IR SOLN
Status: DC | PRN
  Administered 2019-01-16: 6000 mL
  Administered 2019-01-16: 3000 mL

## 2019-01-16 MED ORDER — FENTANYL CITRATE (PF) 100 MCG/2ML IJ SOLN: 25.0000 ug | INTRAMUSCULAR | Status: DC | PRN

## 2019-01-16 MED ORDER — SCOPOLAMINE 1 MG/3DAYS TD PT72
MEDICATED_PATCH | TRANSDERMAL | Status: AC
  Filled 2019-01-16: qty 1

## 2019-01-16 MED ORDER — BUPIVACAINE HCL (PF) 0.5 % IJ SOLN
INTRAMUSCULAR | Status: DC | PRN
  Administered 2019-01-16: 15 mL via PERINEURAL

## 2019-01-16 MED ORDER — LACTATED RINGERS IV SOLN
INTRAVENOUS | Status: DC
  Administered 2019-01-16 (×2): via INTRAVENOUS

## 2019-01-16 MED ORDER — OXYCODONE-ACETAMINOPHEN 5-325 MG PO TABS: 1.0000 | ORAL_TABLET | ORAL | 0 refills | Status: DC | PRN

## 2019-01-16 MED ORDER — FENTANYL CITRATE (PF) 100 MCG/2ML IJ SOLN
INTRAMUSCULAR | Status: DC | PRN
  Administered 2019-01-16: 100 ug via INTRAVENOUS

## 2019-01-16 MED ORDER — DEXAMETHASONE SODIUM PHOSPHATE 10 MG/ML IJ SOLN
INTRAMUSCULAR | Status: DC | PRN
  Administered 2019-01-16: 10 mg via INTRAVENOUS

## 2019-01-16 MED ORDER — ONDANSETRON HCL 4 MG PO TABS: 4.0000 mg | ORAL_TABLET | Freq: Three times a day (TID) | ORAL | 1 refills | Status: DC | PRN

## 2019-01-16 MED ORDER — CEFAZOLIN SODIUM-DEXTROSE 2-4 GM/100ML-% IV SOLN
2.0000 g | INTRAVENOUS | Status: AC
  Administered 2019-01-16: 2 g via INTRAVENOUS
  Filled 2019-01-16: qty 100

## 2019-01-16 MED ORDER — MIDAZOLAM HCL 2 MG/2ML IJ SOLN
1.0000 mg | INTRAMUSCULAR | Status: DC
  Administered 2019-01-16: 2 mg via INTRAVENOUS
  Filled 2019-01-16: qty 2

## 2019-01-16 MED ORDER — FENTANYL CITRATE (PF) 100 MCG/2ML IJ SOLN
INTRAMUSCULAR | Status: AC
  Administered 2019-01-16: 100 ug via INTRAVENOUS
  Filled 2019-01-16: qty 2

## 2019-01-16 MED ORDER — FENTANYL CITRATE (PF) 250 MCG/5ML IJ SOLN
INTRAMUSCULAR | Status: AC
  Filled 2019-01-16: qty 5

## 2019-01-16 MED ORDER — DEXAMETHASONE SODIUM PHOSPHATE 10 MG/ML IJ SOLN
INTRAMUSCULAR | Status: AC
  Filled 2019-01-16: qty 1

## 2019-01-16 MED ORDER — ONDANSETRON HCL 4 MG/2ML IJ SOLN
INTRAMUSCULAR | Status: DC | PRN
  Administered 2019-01-16: 4 mg via INTRAVENOUS

## 2019-01-16 MED ORDER — PROMETHAZINE HCL 25 MG/ML IJ SOLN: 6.2500 mg | INTRAMUSCULAR | Status: DC | PRN

## 2019-01-16 MED ORDER — PROPOFOL 10 MG/ML IV BOLUS
INTRAVENOUS | Status: AC
  Filled 2019-01-16: qty 20

## 2019-01-16 MED ORDER — DIPHENHYDRAMINE HCL 50 MG/ML IJ SOLN
INTRAMUSCULAR | Status: AC
  Filled 2019-01-16: qty 1

## 2019-01-16 MED ORDER — FENTANYL CITRATE (PF) 100 MCG/2ML IJ SOLN
50.0000 ug | INTRAMUSCULAR | Status: DC
  Administered 2019-01-16: 100 ug via INTRAVENOUS

## 2019-01-16 SURGICAL SUPPLY — 59 items
ANCHOR PEEK 4.75X19.1 SWLK C (Anchor) ×3 IMPLANT
ANCHOR PEEK SWIVEL LOCK 5.5 (Anchor) ×6 IMPLANT
BLADE EXCALIBUR 4.0MM X 13CM (MISCELLANEOUS) ×1
BLADE EXCALIBUR 4.0X13 (MISCELLANEOUS) ×2 IMPLANT
BLADE SURG SZ11 CARB STEEL (BLADE) ×3 IMPLANT
BOOTIES KNEE HIGH SLOAN (MISCELLANEOUS) ×6 IMPLANT
BURR OVAL 8 FLU 4.0MM X 13CM (MISCELLANEOUS) ×1
BURR OVAL 8 FLU 4.0X13 (MISCELLANEOUS) ×2 IMPLANT
CANNULA ACUFLEX KIT 5X76 (CANNULA) ×3 IMPLANT
CANNULA DRILOCK 5.0MMX75MM (CANNULA) ×1
CANNULA DRILOCK 5.0X75 (CANNULA) ×2 IMPLANT
CANNULA TWIST IN 8.25X7CM (CANNULA) ×3 IMPLANT
CLOSURE STERI-STRIP 1/2X4 (GAUZE/BANDAGES/DRESSINGS) ×1
CLOSURE WOUND 1/2 X4 (GAUZE/BANDAGES/DRESSINGS) ×1
CLSR STERI-STRIP ANTIMIC 1/2X4 (GAUZE/BANDAGES/DRESSINGS) ×2 IMPLANT
CONNECTOR 5 IN 1 STRAIGHT STRL (MISCELLANEOUS) ×3 IMPLANT
COVER WAND RF STERILE (DRAPES) ×3 IMPLANT
DISSECTOR  3.8MM X 13CM (MISCELLANEOUS) ×2
DISSECTOR 3.8MM X 13CM (MISCELLANEOUS) ×1 IMPLANT
DRAPE INCISE 23X17 IOBAN STRL (DRAPES) ×2
DRAPE INCISE IOBAN 23X17 STRL (DRAPES) ×1 IMPLANT
DRAPE INCISE IOBAN 66X45 STRL (DRAPES) ×3 IMPLANT
DRAPE ORTHO SPLIT 77X108 STRL (DRAPES) ×4
DRAPE SURG 17X11 SM STRL (DRAPES) ×3 IMPLANT
DRAPE SURG ORHT 6 SPLT 77X108 (DRAPES) ×2 IMPLANT
DRAPE U-SHAPE 47X51 STRL (DRAPES) ×3 IMPLANT
DRSG PAD ABDOMINAL 8X10 ST (GAUZE/BANDAGES/DRESSINGS) ×6 IMPLANT
DURAPREP 26ML APPLICATOR (WOUND CARE) ×6 IMPLANT
GAUZE SPONGE 4X4 12PLY STRL (GAUZE/BANDAGES/DRESSINGS) ×3 IMPLANT
GLOVE BIO SURGEON STRL SZ7.5 (GLOVE) ×3 IMPLANT
GLOVE BIO SURGEON STRL SZ8 (GLOVE) ×3 IMPLANT
GLOVE SS BIOGEL STRL SZ 7 (GLOVE) ×1 IMPLANT
GLOVE SS BIOGEL STRL SZ 7.5 (GLOVE) ×1 IMPLANT
GLOVE SUPERSENSE BIOGEL SZ 7 (GLOVE) ×2
GLOVE SUPERSENSE BIOGEL SZ 7.5 (GLOVE) ×2
GOWN STRL REUS W/ TWL LRG LVL3 (GOWN DISPOSABLE) ×2 IMPLANT
GOWN STRL REUS W/TWL LRG LVL3 (GOWN DISPOSABLE) ×4
KIT BASIN OR (CUSTOM PROCEDURE TRAY) ×3 IMPLANT
KIT SHOULDER TRACTION (DRAPES) ×3 IMPLANT
MANIFOLD NEPTUNE II (INSTRUMENTS) ×3 IMPLANT
NEEDLE SCORPION MULTI FIRE (NEEDLE) ×3 IMPLANT
NEEDLE SPNL 18GX3.5 QUINCKE PK (NEEDLE) ×3 IMPLANT
NS IRRIG 1000ML POUR BTL (IV SOLUTION) ×3 IMPLANT
PACK SHOULDER (CUSTOM PROCEDURE TRAY) ×3 IMPLANT
PAD ABD 8X10 STRL (GAUZE/BANDAGES/DRESSINGS) ×6 IMPLANT
PAD ARMBOARD 7.5X6 YLW CONV (MISCELLANEOUS) ×6 IMPLANT
PORT APPOLLO RF 90DEGREE MULTI (SURGICAL WAND) ×3 IMPLANT
SLING ARM FOAM STRAP LRG (SOFTGOODS) ×3 IMPLANT
SLING ARM FOAM STRAP MED (SOFTGOODS) IMPLANT
SLING ULTRA III MED (ORTHOPEDIC SUPPLIES) ×3 IMPLANT
SPONGE LAP 4X18 RFD (DISPOSABLE) IMPLANT
STRIP CLOSURE SKIN 1/2X4 (GAUZE/BANDAGES/DRESSINGS) ×2 IMPLANT
SUT MNCRL AB 3-0 PS2 18 (SUTURE) ×3 IMPLANT
SUT PDS AB 0 CT 36 (SUTURE) ×3 IMPLANT
SUT TIGER TAPE 7 IN WHITE (SUTURE) ×3 IMPLANT
SYR 20CC LL (SYRINGE) ×3 IMPLANT
TAPE PAPER 3X10 WHT MICROPORE (GAUZE/BANDAGES/DRESSINGS) ×3 IMPLANT
TOWEL OR 17X26 10 PK STRL BLUE (TOWEL DISPOSABLE) ×6 IMPLANT
WATER STERILE IRR 1000ML POUR (IV SOLUTION) ×3 IMPLANT

## 2019-01-16 NOTE — Op Note (Signed)
01/16/2019  2:37 PM  PATIENT:   Autumn Fitzgerald  73 y.o. female  PRE-OPERATIVE DIAGNOSIS:  right shoulder impingement, acromioclavicular osteoarthritis, rotator cuff tear  POST-OPERATIVE DIAGNOSIS: Same with additional finding of extensive degenerative labral tear, and severe biceps tendon tendinopathy, and moderate adhesive capsulitis.  PROCEDURE:  1.  Right shoulder examination under anesthesia  2.  Right shoulder manipulation under anesthesia  3.  Right shoulder glenohumeral joint diagnostic arthroscopy  4.  Labral debridement  5.  Biceps tendon tenotomy  6.  Arthroscopic subacromial decompression and bursectomy  7.  Arthroscopic distal clavicle resection  8.  Right shoulder arthroscopic rotator cuff repair using a double row suture bridge repair construct  SURGEON:  Ebony Yorio, Metta Clines M.D.  ASSISTANTS: Jenetta Loges, PA-C  ANESTHESIA:   General endotracheal and interscalene block with Exparel  EBL: Minimal  SPECIMEN: None  Drains: None   PATIENT DISPOSITION:  PACU - hemodynamically stable.    PLAN OF CARE: Discharge to home after PACU  Brief history:  Patient is a 73 year old female has had chronic and progressively increasing right shoulder pain which is been refractory to prolonged attempts at conservative management.  Her preoperative MRI scan confirms a full-thickness rotator cuff tear.  Due to her ongoing pain and functional rotation she is brought to the operating room this time for planned right shoulder arthroscopy as described below  Preoperatively I counseled the patient regarding treatment options as well as the potential risks versus benefits thereof.  Possible surgical complications were reviewed including bleeding, infection, neurovascular injury, persistent pain, loss of motion, recurrence of rotator cuff tear, anesthetic complication, and possible need for additional surgery.  She understands and accepts and agrees with her planned  procedure.  Procedure in detail:  After undergoing routine preop evaluation patient received prophylactic antibiotics and an interscalene block with Exparel was established in the holding area by the anesthesia department.  He was placed supine on the operating table and underwent smooth induction of a general endotracheal anesthesia.  Turned to the left lateral decubitus position on the beanbag and appropriately padded and protected.  Right shoulder was examined with moderate restrictions in mobility noted.  A manipulation was performed achieving full motion with palpable and audible release of adhesions.  Right arm was then suspended at 70 degrees of abduction with 10 pounds of traction in the right shoulder girdle region was sterilely prepped and draped in standard fashion.  Timeout was called.  A posterior portal established into the glenohumeral joint anterior portal established under direct visualization.  The articular surfaces were found to be in relatively good condition.  There was significant synovitis and moderate reduction in capsular volume.  Extensive degenerative labral tearing was also noted anteriorly superiorly and posteriorly.  The degenerated areas of labral tissue and labral tearing were debrided with a shaver.  The biceps tendon showed severe attenuation and thinning proximally and a biceps tenotomy was performed.  Rotator cuff showed a significant at least partial articular sided tear which upon further debridement appeared as though this was essentially a full-thickness defect with perhaps just a few fibers intact on the bursal side.  We did pass a marking suture of 0 PDS at this point.  The remaining inspection of the glenohumeral joint showed no additional pathologies although we did perform a anterior superior synovectomy.  Fluid and instruments were then removed the arm was then dropped down to 30 degrees of abduction with arthroscope introduced into the subacromial space of the  posterior portal and a  direct lateral portal was established into the subacromial space.  Abundant proliferative bursal tissue and multiple adhesions were encountered and these were all divided and excised with a combination the shaver and the Stryker wand.  The wand was then used to remove the periosteum from the undersurface of the anterior half of the acromion and a subacromial decompression was performed with a bur creating a type I morphology.  A portal was then established directly anterior to the distal clavicle and the distal clavicle resection was performed with a bur.  Care was taken to confirm visualization of entire circumference of the distal clavicle to ensure adequate removal of bone.  We then completed the subacromial/subdeltoid bursectomy.  Rotator cuff tear was readily identified from the bursal side and this was debrided back to a defect that had a footprint approximately 2 cm in width.  The greater tuberosity was prepared removing soft tissue and abrading the bone to bleeding bed and the free margin of the rotator cuff tendon was trimmed back to healthy tissue with a shaver.  Through a stab wound off the lateral margin of the acromion placed an Arthrex peek swivel lock suture anchor loaded with fiber tape and all 4 suture limbs were then shuttled equidistant across the width of the tear using the scorpion suture passer and the 2 lateral anchors were placed creating a double row repair which nicely reapproximated the tendon over the footprint on the tuberosity with the overall construct much to our satisfaction.  Final hemostasis was obtained.  Fluid analysis removed.  The portals were closed with a Monocryl and Steri-Strip.  A dry dressing taped about the right shoulder right and was placed in sling immobilizer abduction pillow and the patient was awakened, extubated, taken recovery in stable addition.  Jenetta Loges, PA-C was used as an Environmental consultant throughout this case essential for help with  positioning of the patient, positioning extremity, tissue manipulation, suture management, wound closure, and intraoperative decision making.  Metta Clines Criag Wicklund MD   Contact # [phone]

## 2019-01-16 NOTE — Discharge Instructions (Signed)
   Kevin M. Supple, M.D., F.A.A.O.S. Orthopaedic Surgery Specializing in Arthroscopic and Reconstructive Surgery of the Shoulder [phone] [address] - [place], [place] [address] - Fax [phone]  POST-OP SHOULDER ARTHROSCOPIC ROTATOR CUFF  REPAIR INSTRUCTIONS  1. Call the office at [phone] to schedule your first post-op appointment 7-10 days from the date of your surgery.  2. Leave the steri-strips in place over your incisions when performing dressing changes and showering. You may remove your dressings and begin showering 72 hours from surgery. You can expect drainage that is clear to bloody in nature that occasionally will soak through your dressings. If this occurs go ahead and perform a dressing change. The drainage should lessen daily and when there is no drainage from your incisions feel free to go without a dressing.  3. Wear your sling/immobilizer at all times except to perform the exercises below or to occasionally let your arm dangle by your side to stretch your elbow. You also need to sleep in your sling immobilizer until instructed otherwise.  4. Range of motion to your elbow, wrist, and hand are encouraged 3-5 times daily. Exercise to your hand and fingers helps to reduce swelling you may experience.  5. Utilize ice to the shoulder 3-4 times minimum a day and additionally if you are experiencing pain.  6. You may one-armed drive when safely off of narcotics and muscle relaxants. You may use your hand that is in the sling to support the steering wheel only. However, should it be your right arm that is in the sling it is not to be used for gear shifting in a manual transmission.  7. Pain control following an exparel block  To help control your post-operative pain you received a nerve block  performed with Exparel which is a long acting anesthetic (numbing agent) which can provide pain relief and sensations of numbness (and relief of pain) in the operative  shoulder and arm for up to 3 days. Sometimes it provides mixed relief, meaning you may still have numbness in certain areas of the arm but can still be able to move  parts of that arm, hand, and fingers. We recommend that your prescribed pain medications  be used as needed. We do not feel it is necessary to "pre medicate" and "stay ahead" of pain.  Taking narcotic pain medications when you are not having any pain can lead to unnecessary and potentially dangerous side effects.    8. Pain medications can produce constipation along with their use. If you experience this, the use of an over the counter stool softener or laxative daily is recommended.   9. For additional questions or concerns, please do not hesitate to call the office. If after hours there is an answering service to forward your concerns to the physician on call.  POST-OP EXERCISES  Pendulum Exercises  Perform pendulum exercises while standing and bending at the waist. Support your uninvolved arm on a table or chair and allow your operated arm to hang freely. Make sure to do these exercises passively - not using you shoulder muscle.  Repeat 20 times. Do 3 sessions per day.   

## 2019-01-16 NOTE — Transfer of Care (Signed)
Immediate Anesthesia Transfer of Care Note  Patient: Autumn Fitzgerald  Procedure(s) Performed: Right shoulder arthroscopy with subacromial decompression, distal clavicle resection, rotator cuff repair, bicep tenotomy (Right Shoulder)  Patient Location: PACU  Anesthesia Type:General  Level of Consciousness: sedated, drowsy and responds to stimulation  Airway & Oxygen Therapy: Patient Spontanous Breathing and Patient connected to face mask oxygen  Post-op Assessment: Report given to RN and Post -op Vital signs reviewed and stable  Post vital signs: Reviewed and stable  Last Vitals:  Vitals Value Taken Time  BP    Temp    Pulse 100 01/16/2019  3:00 PM  Resp 15 01/16/2019  3:00 PM  SpO2 100 % 01/16/2019  3:00 PM  Vitals shown include unvalidated device data.  Last Pain:  Vitals:   01/16/19 1125  TempSrc:   PainSc: 8       Patients Stated Pain Goal: 6 (59/93/57 0177)  Complications: No apparent anesthesia complications

## 2019-01-16 NOTE — Anesthesia Procedure Notes (Signed)
Procedure Name: Intubation Date/Time: 01/16/2019 1:15 PM Performed by: Duane Boston, MD Pre-anesthesia Checklist: Patient identified, Suction available, Emergency Drugs available and Patient being monitored Patient Re-evaluated:Patient Re-evaluated prior to induction Oxygen Delivery Method: Circle system utilized Preoxygenation: Pre-oxygenation with 100% oxygen Induction Type: IV induction, Rapid sequence and Cricoid Pressure applied Laryngoscope Size: Miller and 2 Grade View: Grade I Tube type: Oral Tube size: 7.5 mm Number of attempts: 1 Airway Equipment and Method: Stylet Placement Confirmation: ETT inserted through vocal cords under direct vision,  positive ETCO2 and breath sounds checked- equal and bilateral Secured at: 21 cm Tube secured with: Tape

## 2019-01-16 NOTE — H&P (Signed)
Autumn Fitzgerald    Chief Complaint: right shoulder impingement, acromioclavicular osteoarthritis, rotator cuff tear HPI: The patient is a 73 y.o. female with chronic right shoulder pain and impingement syndrome with MRI scan evidence of a full-thickness rotator cuff tear.  Symptomatic AC joint arthropathy is also noted.  Past Medical History:  Diagnosis Date  . Arthritis   . Depression   . GERD (gastroesophageal reflux disease)   . Glaucoma, both eyes   . Head injury, closed, without LOC 02/2013   did not LOC per patient  . Headache   . History of syncope 2014   near syncope,  loop recorder placed and explanted 2017  . HLD (hyperlipidemia)   . Hypertension    "at times"    . 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 Hypothyroidism    followed by pcp  . LBBB (left bundle branch block)   . Mild persistent allergic asthma   . Palpitations   . Panic attacks   . PONV (postoperative nausea and vomiting)   . Pre-diabetes   . Wears glasses     Past Surgical History:  Procedure Laterality Date  . CARDIAC SURGERY  02/05/2007   in [place]  for HOCM  . CATARACT EXTRACTION W/ INTRAOCULAR LENS  IMPLANT, BILATERAL  right 07-23-2017;  left 10-15-2017  . COLONOSCOPY N/A 11/24/2013   Procedure: COLONOSCOPY;  Surgeon: Juanita Craver, MD;  Location: [facility];  Service: Endoscopy;  Laterality: N/A;  . DOPPLER ECHOCARDIOGRAPHY  03/14/2013   EF 65-70%,trivial AI,mild-mod. MR,Mod. TR,mod. concentric hypertrophy  . EP IMPLANTABLE DEVICE N/A 08/22/2016   Procedure: Loop Recorder Removal;  Surgeon: Sanda Klein, MD;  Location: [facility];  Service: Cardiovascular;  Laterality: N/A;  . ESOPHAGOGASTRODUODENOSCOPY N/A 11/24/2013   Procedure: ESOPHAGOGASTRODUODENOSCOPY (EGD);  Surgeon: Juanita Craver, MD;  Location: [facility];  Service: Endoscopy;  Laterality: N/A;  . GLAUCOMA SURGERY Bilateral 2019   "release pressure  . KNEE ARTHROSCOPY Right 10/17/2013   Procedure: ARTHROSCOPY RIGHT KNEE;  Surgeon: [name], MD;  Location: [facility];  Service: Orthopedics;  Laterality: Right;  . LAPAROSCOPIC CHOLECYSTECTOMY  02-03-2010   dr d blackman  @[facility]   . LOOP RECORDER IMPLANT  03/18/2013  . LOOP RECORDER IMPLANT N/A 03/18/2013   Procedure: LOOP RECORDER IMPLANT;  Surgeon: Sanda Klein, MD;  Location: [facility];  Service: Cardiovascular;  Laterality: N/A;  . TUBAL LIGATION  yrs ago    Family History  Problem Relation Age of Onset  . Cancer Mother        breast  . Hypertension Mother   . Cancer Father        prostate  . Diabetes Sister   . Heart attack Sister   . Migraines Neg Hx     Social History:  reports that she has never smoked. She has never used smokeless tobacco. She reports that she does not drink alcohol or use drugs.   Medications Prior to Admission  Medication Sig Dispense Refill  . acetaminophen (TYLENOL) 500 MG tablet Take 500 mg by mouth every 6 (six) hours as needed for moderate pain or headache.    . Cholecalciferol (VITAMIN D) 50 MCG (2000 UT) tablet Take 2,000 Units by mouth daily.    . clobetasol ointment (TEMOVATE) 0.05 % Apply to affected area every night for 4 weeks, then every other day for 4 weeks and then twice a week for 4 weeks or until resolution. (Patient taking differently: Apply 1 application topically at bedtime. )  30 g 5  . clonazePAM (KLONOPIN) 1 MG tablet Take 2 mg by mouth at bedtime.     Marland Kitchen DEXILANT 30 MG capsule Take 30 mg by mouth daily. Per pt only takes with food    . ezetimibe-simvastatin (VYTORIN) 10-40 MG per tablet Take 1 tablet by mouth every evening.     . Glucosamine-Chondroitin (OSTEO BI-FLEX REGULAR STRENGTH PO) Take 1 tablet by mouth daily.    Marland Kitchen levothyroxine (SYNTHROID) 50 MCG tablet Take 50 mcg by mouth daily.     . Multiple Vitamin (MULTIVITAMIN) tablet Take 1 tablet by mouth daily.    Marland Kitchen XIIDRA 5 % SOLN Place 1 drop into both eyes 2 (two) times daily.     Marland Kitchen albuterol (PROVENTIL  HFA;VENTOLIN HFA) 108 (90 Base) MCG/ACT inhaler INHALE TWO PUFFS INTO THE LUNGS  EVERY 4 TO 6 HOURS AS NEEDED FOR COUGH OR WHEEZE. (Patient taking differently: Inhale 2 puffs into the lungs every 4 (four) hours as needed for wheezing or shortness of breath. ) 18 each 0  . atenolol (TENORMIN) 25 MG tablet Take 0.5 tablets (12.5 mg total) by mouth daily as needed (for high blood pressure). (Patient taking differently: Take 12.5 mg by mouth daily as needed (for high blood pressure). Per pt was told by cardiologist in [place] to take "when pressure is high and feels pressure in chest and feel bad") 45 tablet 3  . fluticasone (FLONASE) 50 MCG/ACT nasal spray 1 spray in each nostril 1-2 times daily (Patient taking differently: Place 1 spray into both nostrils daily as needed for allergies. ) 16 g 5     Physical Exam: Right shoulder demonstrates painful and guarded motion with a severely positive impingement sign.  Vitals  Temp:  [98 F (36.7 C)-98.5 F (36.9 C)] 98.5 F (36.9 C) (02/06 1054) Pulse Rate:  [71-97] 96 (02/06 1159) Resp:  [16-26] 25 (02/06 1159) BP: (123-134)/(79-84) 134/84 (02/06 1054) SpO2:  [98 %-100 %] 99 % (02/06 1159) Weight:  [65.5 kg] 65.5 kg (02/05 1343)  Assessment/Plan  Impression: right shoulder impingement, acromioclavicular osteoarthritis, rotator cuff tear  Plan of Action: Procedure(s): Right shoulder arthroscopy with subacromial decompression, distal clavicle resection, rotator cuff repair  Autumn Fitzgerald Autumn Fitzgerald 01/16/2019, 12:04 PM Contact # [phone]

## 2019-01-16 NOTE — Progress Notes (Signed)
Assisted Dr. Singer with right, ultrasound guided, interscalene  block. Side rails up, monitors on throughout procedure. See vital signs in flow sheet. Tolerated Procedure well. 

## 2019-01-16 NOTE — Anesthesia Postprocedure Evaluation (Signed)
Anesthesia Post Note  Patient: Autumn Fitzgerald  Procedure(s) Performed: Right shoulder arthroscopy with subacromial decompression, distal clavicle resection, rotator cuff repair, bicep tenotomy (Right Shoulder)     Patient location during evaluation: PACU Anesthesia Type: General Level of consciousness: sedated Pain management: pain level controlled Vital Signs Assessment: post-procedure vital signs reviewed and stable Respiratory status: spontaneous breathing and respiratory function stable Cardiovascular status: stable Postop Assessment: no apparent nausea or vomiting Anesthetic complications: no    Last Vitals:  Vitals:   01/16/19 1615 01/16/19 1627  BP: (!) 147/83 (!) 159/81  Pulse: 98 95  Resp: 20 16  Temp: (!) 36.4 C 36.4 C  SpO2: 95% 93%    Last Pain:  Vitals:   01/16/19 1627  TempSrc:   PainSc: 0-No pain                 Lyric Rossano DANIEL

## 2019-01-16 NOTE — Anesthesia Procedure Notes (Addendum)
Anesthesia Regional Block: Interscalene brachial plexus block   Pre-Anesthetic Checklist: ,, timeout performed, Correct Patient, Correct Site, Correct Laterality, Correct Procedure, Correct Position, site marked, Risks and benefits discussed,  Surgical consent,  Pre-op evaluation,  At surgeon's request and post-op pain management  Laterality: Right  Prep: chloraprep       Needles:  Injection technique: Single-shot  Needle Type: Echogenic Stimulator Needle     Needle Length: 5cm  Needle Gauge: 22     Additional Needles:   Narrative:  Start time: 01/16/2019 11:49 AM End time: 01/16/2019 11:59 AM Injection made incrementally with aspirations every 5 mL.  Performed by: Personally  Anesthesiologist: Duane Boston, MD  Additional Notes: Functioning IV was confirmed and monitors applied.  A 44mm 22ga echogenic arrow stimulator was used. Sterile prep and drape,hand hygiene and sterile gloves were used.Ultrasound guidance: relevant anatomy identified, needle position confirmed, local anesthetic spread visualized around nerve(s)., vascular puncture avoided.  Image printed for medical record.  Negative aspiration and negative test dose prior to incremental administration of local anesthetic. The patient tolerated the procedure well.

## 2019-01-17 ENCOUNTER — Encounter (HOSPITAL_COMMUNITY): Payer: Self-pay | Admitting: Orthopedic Surgery

## 2019-01-21 ENCOUNTER — Other Ambulatory Visit: Payer: Self-pay | Admitting: Allergy and Immunology

## 2019-02-05 DIAGNOSIS — M25511 Pain in right shoulder: Secondary | ICD-10-CM | POA: Insufficient documentation

## 2019-02-21 ENCOUNTER — Other Ambulatory Visit: Payer: Self-pay | Admitting: Allergy and Immunology

## 2019-03-18 ENCOUNTER — Ambulatory Visit: Payer: Medicare Other | Admitting: Allergy and Immunology

## 2019-03-24 ENCOUNTER — Other Ambulatory Visit: Payer: Self-pay | Admitting: Allergy and Immunology

## 2019-03-26 ENCOUNTER — Telehealth: Payer: Self-pay | Admitting: Cardiovascular Disease

## 2019-03-26 NOTE — Telephone Encounter (Signed)
New Message  Patient would like to be contacted for a virtual appointment.

## 2019-03-26 NOTE — Telephone Encounter (Signed)
Spoke to patient . She does not have smartphone or computer. Patient would like to  Reschedule appointment,- done'.      Primary Cardiologist:  Dr Sallyanne Kuster  Patient contacted.  History reviewed.  No symptoms to suggest any unstable cardiac conditions.  Based on discussion, with current pandemic situation, we will be postponing this appointment for Autumn Fitzgerald with a plan for f/u in sept 23,2020 at 2 pm  or sooner if feasible/necessary.  If symptoms change, she has been instructed to contact our office.    [name], RN  03/26/2019 11:00 AM         .

## 2019-04-08 ENCOUNTER — Ambulatory Visit: Payer: Medicare Other | Admitting: Cardiovascular Disease

## 2019-04-15 ENCOUNTER — Ambulatory Visit: Payer: Medicare Other | Admitting: Allergy and Immunology

## 2019-04-20 ENCOUNTER — Other Ambulatory Visit: Payer: Self-pay | Admitting: Allergy and Immunology

## 2019-05-06 ENCOUNTER — Ambulatory Visit: Payer: Medicare Other | Admitting: Allergy and Immunology

## 2019-05-07 ENCOUNTER — Other Ambulatory Visit: Payer: Self-pay

## 2019-05-07 ENCOUNTER — Encounter (HOSPITAL_COMMUNITY): Payer: Self-pay

## 2019-05-07 ENCOUNTER — Ambulatory Visit (HOSPITAL_COMMUNITY)
Admission: EM | Admit: 2019-05-07 | Discharge: 2019-05-07 | Disposition: A | Discharge status: Home / Self Care | Payer: Medicare Other | Attending: Internal Medicine | Admitting: Internal Medicine

## 2019-05-07 DIAGNOSIS — I10 Essential (primary) hypertension: Secondary | ICD-10-CM | POA: Diagnosis not present

## 2019-05-07 DIAGNOSIS — K219 Gastro-esophageal reflux disease without esophagitis: Secondary | ICD-10-CM

## 2019-05-07 MED ORDER — ATENOLOL 25 MG PO TABS: 25.0000 mg | ORAL_TABLET | Freq: Every day | ORAL | 3 refills | Status: DC | PRN

## 2019-05-07 NOTE — ED Provider Notes (Signed)
[facility]    CSN: [id number] Arrival date & time: 05/07/19  1146     History   Chief Complaint Chief Complaint  Patient presents with  . Abdominal Pain  . Headache    HPI Autumn Fitzgerald is a 73 y.o. female with a history of hypertension, gastroesophageal reflux disease comes to urgent care with complaints of worsening retro-orbital headaches and epigastric pain of about 5 days duration.  Patient says her symptoms started insidiously and is gotten progressively worse.  Headache is retro-orbital bilaterally.  She denies any light sensitivity.  No relieving factors.  She is noticed that her blood pressure has been high over the past several days.  She is apparently compliant with her medications but she has not taken her blood pressure medications today.  Abdominal pain is mainly in the epigastric region no aggravating or relieving factors.  She stopped taking her gastroesophageal reflux medications.  No radiation of the abdominal pain.  Both pain is of moderate severity.  Patient called her orthopedic surgery office and she was advised to come to urgent care for evaluation prior to fulfilling her visit.Marland Kitchen   HPI  Past Medical History:  Diagnosis Date  . Arthritis   . Depression   . GERD (gastroesophageal reflux disease)   . Glaucoma, both eyes   . Head injury, closed, without LOC 02/2013   did not LOC per patient  . Headache   . History of syncope 2014   near syncope,  loop recorder placed and explanted 2017  . HLD (hyperlipidemia)   . Hypertension    "at times"    . 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 Hypothyroidism    followed by pcp  . LBBB (left bundle branch block)   . Mild persistent allergic asthma   . Palpitations   . Panic attacks   . PONV (postoperative nausea and vomiting)   . Pre-diabetes   . Wears glasses     Patient Active Problem List   Diagnosis Date Noted  . Viral URI with cough 10/09/2017  . Encounter for loop recorder at  end of battery life 08/02/2016  . New onset of headaches after age 89 09/30/2015  . Dizziness 09/30/2015  . Vision loss, bilateral 09/30/2015  . Lichen sclerosus et atrophicus 06/18/2014  . Benign gastric polyp 02/20/2014  . Degenerative joint disease involving multiple joints 09/10/2013  . Hypertrophic obstructive cardiomyopathy ([place]) 07/23/2013  . LBBB (left bundle branch block) 07/23/2013  . Subclinical hypothyroidism 05/14/2012  . Lower abdominal pain 01/10/2012  . Panic attacks   . GERD (gastroesophageal reflux disease)   . HLD (hyperlipidemia)   . Mild persistent asthma   . HTN (hypertension)   . Palpitations   . Allergic rhinitis 02/24/2011  . Anxiety, generalized 02/24/2011  . 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 CARDIAC SURGERY  02/05/2007   in [place]  for HOCM  . CATARACT EXTRACTION W/ INTRAOCULAR LENS  IMPLANT, BILATERAL  right 07-23-2017;  left 10-15-2017  . COLONOSCOPY N/A 11/24/2013   Procedure: COLONOSCOPY;  Surgeon: Juanita Craver, MD;  [facility];  Service: Endoscopy;  Laterality: N/A;  . DOPPLER ECHOCARDIOGRAPHY  03/14/2013   EF 65-70%,trivial AI,mild-mod. MR,Mod. TR,mod. concentric hypertrophy  . EP IMPLANTABLE DEVICE N/A 08/22/2016   Procedure: Loop Recorder Removal;  Surgeon: Sanda Klein, MD;  Location: [facility];  Service: Cardiovascular;  Laterality: N/A;  . ESOPHAGOGASTRODUODENOSCOPY  N/A 11/24/2013   Procedure: ESOPHAGOGASTRODUODENOSCOPY (EGD);  Surgeon: Juanita Craver, MD;  [facility];  Service: Endoscopy;  Laterality: N/A;  . GLAUCOMA SURGERY Bilateral 2019   "release pressure  . KNEE ARTHROSCOPY Right 10/17/2013   Procedure: ARTHROSCOPY RIGHT KNEE;  Surgeon: [name], MD;  Location: [facility];  Service: Orthopedics;  Laterality: Right;  . LAPAROSCOPIC CHOLECYSTECTOMY  02-03-2010   dr d blackman  @[facility]   . LOOP RECORDER  IMPLANT  03/18/2013  . LOOP RECORDER IMPLANT N/A 03/18/2013   Procedure: LOOP RECORDER IMPLANT;  Surgeon: Sanda Klein, MD;  Location: [facility];  Service: Cardiovascular;  Laterality: N/A;  . SHOULDER ARTHROSCOPY WITH ROTATOR CUFF REPAIR Right 01/16/2019   Procedure: Right shoulder arthroscopy with subacromial decompression, distal clavicle resection, rotator cuff repair, bicep tenotomy;  Surgeon: Justice Britain, MD;  [facility]: [facility];  Service: Orthopedics;  Laterality: Right;  . TUBAL LIGATION  yrs ago    OB History    Gravida  5   Para  5   Term  5   Preterm      AB      Living  4     SAB      TAB      Ectopic      Multiple      Live Births               Home Medications    Prior to Admission medications   Medication Sig Start Date End Date Taking? Authorizing Provider  acetaminophen (TYLENOL) 500 MG tablet Take 500 mg by mouth every 6 (six) hours as needed for moderate pain or headache.    [provider]  albuterol (VENTOLIN HFA) 108 (90 Base) MCG/ACT inhaler INHALE TWO PUFFS INTO THE LUNGS EVERY 4 TO 6 HOURS AS NEEDED FOR COUGH OR WHEEZING 04/21/19   Kozlow, Donnamarie Poag, MD  atenolol (TENORMIN) 25 MG tablet Take 0.5 tablets (12.5 mg total) by mouth daily as needed (for high blood pressure). Patient taking differently: Take 12.5 mg by mouth daily as needed (for high blood pressure). Per pt was told by cardiologist in [place] to take "when pressure is high and feels pressure in chest and feel bad" 12/21/14   Croitoru, Dani Gobble, MD  Cholecalciferol (VITAMIN D) 50 MCG (2000 UT) tablet Take 2,000 Units by mouth daily.    [provider]  clobetasol ointment (TEMOVATE) 0.05 % Apply to affected area every night for 4 weeks, then every other day for 4 weeks and then twice a week for 4 weeks or until resolution. Patient taking differently: Apply 1 application topically at bedtime.  09/12/18   Constant, Peggy, MD  clonazePAM (KLONOPIN) 1 MG tablet Take 2 mg by mouth  at bedtime.     [provider]  cyclobenzaprine (FLEXERIL) 5 MG tablet Take 1 tablet (5 mg total) by mouth 3 (three) times daily as needed for muscle spasms. 01/16/19   Shuford, Tracy, PA-C  DEXILANT 30 MG capsule Take 30 mg by mouth daily. Per pt only takes with food 03/01/18   [provider]  ezetimibe-simvastatin (VYTORIN) 10-40 MG per tablet Take 1 tablet by mouth every evening.     [provider]  fluticasone (FLONASE) 50 MCG/ACT nasal spray 1 spray in each nostril 1-2 times daily Patient taking differently: Place 1 spray into both nostrils daily as needed for allergies.  09/10/18   Kozlow, Donnamarie Poag, MD  Glucosamine-Chondroitin (OSTEO BI-FLEX REGULAR STRENGTH PO) Take 1 tablet by mouth  daily.    [provider]  levothyroxine (SYNTHROID) 50 MCG tablet Take 50 mcg by mouth daily.  06/15/14   [provider]  Multiple Vitamin (MULTIVITAMIN) tablet Take 1 tablet by mouth daily.    [provider]  ondansetron (ZOFRAN) 4 MG tablet Take 1 tablet (4 mg total) by mouth every 8 (eight) hours as needed for nausea or vomiting. 01/16/19   Shuford, Olivia Mackie, PA-C  oxyCODONE-acetaminophen (PERCOCET) 5-325 MG tablet Take 1 tablet by mouth every 4 (four) hours as needed (max 6 q). 01/16/19   Shuford, Olivia Mackie, PA-C  XIIDRA 5 % SOLN Place 1 drop into both eyes 2 (two) times daily.  08/07/18   [provider]    Family History Family History  Problem Relation Age of Onset  . Cancer Mother        breast  . Hypertension Mother   . Cancer Father        prostate  . Diabetes Sister   . Heart attack Sister   . Migraines Neg Hx     Social History Social History   Tobacco Use  . Smoking status: Never Smoker  . Smokeless tobacco: Never Used  Substance Use Topics  . Alcohol use: No  . Drug use: No     Allergies   Amoxicillin-pot clavulanate; Temazepam; and Lipitor [atorvastatin calcium]   Review of Systems Review of Systems  Constitutional:  Negative for activity change, appetite change, chills, fatigue and fever.  HENT: Negative for congestion, mouth sores, nosebleeds, postnasal drip, rhinorrhea, sinus pressure, sinus pain and sore throat.   Eyes:       Blurry vision when reading.  Respiratory: Negative for cough, shortness of breath and wheezing.   Cardiovascular: Negative.   Gastrointestinal: Positive for abdominal pain. Negative for diarrhea, nausea and vomiting.  Genitourinary: Negative.   Musculoskeletal: Negative.   Skin: Negative.   Neurological: Negative.      Physical Exam Triage Vital Signs ED Triage Vitals  Enc Vitals Group     BP 05/07/19 1209 (!) 151/85     Pulse Rate 05/07/19 1209 97     Resp 05/07/19 1209 16     Temp 05/07/19 1209 98.8 F (37.1 C)     Temp Source 05/07/19 1209 Oral     SpO2 05/07/19 1209 97 %     Weight 05/07/19 1206 155 lb (70.3 kg)     Height --      Head Circumference --      Peak Flow --      Pain Score 05/07/19 1206 0     Pain Loc --      Pain Edu? --      Excl. in [place]? --    No data found.  Updated Vital Signs BP (!) 151/85 (BP Location: Right Arm)   Pulse 97   Temp 98.8 F (37.1 C) (Oral)   Resp 16   Wt 70.3 kg   SpO2 97%   BMI 30.27 kg/m   Visual Acuity Right Eye Distance:   Left Eye Distance:   Bilateral Distance:    Right Eye Near:   Left Eye Near:    Bilateral Near:     Physical Exam   UC Treatments / Results  Labs (all labs ordered are listed, but only abnormal results are displayed) Labs Reviewed - No data to display  EKG None  Radiology No results found.  Procedures Procedures (including critical care time)  Medications Ordered in UC Medications - No data to  display  Initial Impression / Assessment and Plan / UC Course  I have reviewed the triage vital signs and the nursing notes.  Pertinent labs & imaging results that were available during my care of the patient were reviewed by me and considered in my medical decision making  (see chart for details).     1.  Uncontrolled hypertension with global headache: Increase atenolol to 25 mg orally daily Patient to follow-up with primary care physician for further evaluation If headache worsens patient will need evaluation in the emergency department.  2.  Gastroesophageal reflux disease, secondary to patient noncompliance: Patient is advised to resume Dexilant. Patient agrees to resuming Dexilant Medication compliance has been emphasized.  Final Clinical Impressions(s) / UC Diagnoses   Final diagnoses:  None   Discharge Instructions   None    ED Prescriptions    None     Controlled Substance Prescriptions [place] Controlled Substance Registry consulted? No   Chase Picket, MD 05/07/19 1310

## 2019-05-07 NOTE — ED Triage Notes (Addendum)
Pt states she has a headache and abdominal pian.  Pt states she has been feeling weak. This has been going on for 5 days.

## 2019-05-08 ENCOUNTER — Telehealth: Payer: Self-pay | Admitting: Cardiovascular Disease

## 2019-05-08 NOTE — Telephone Encounter (Signed)
Return call to pt she states that she is feeling terrible and her BP is too high today it is running 141/90 HR 76 yesterdays 151/85 she did go to the ED yesterday and was told just to increase atenolol to a whole tablet she will continue this dosing until told to do otherwise. She states that she is having intermittent chest pressure and back pain in "lung area" she states that she sometimes has the chills and palpitations. Palpitations are "pretty normal" for her. She states that she "knows something is wong with her heart" but does not want to go back to the ED, but will do so if directed by the doctor. Please advise

## 2019-05-08 NOTE — Telephone Encounter (Signed)
    Pt c/o BP issue: STAT if pt c/o blurred vision, one-sided weakness or slurred speech  1. What are your last 5 BP readings? 151/85 yesterday, did not take pressure today  2. Are you having any other symptoms (ex. Dizziness, headache, blurred vision, passed out)? Weak, back pain  3. What is your BP issue? Patient states she feels BP is too high, went to Urgent Care on 5/27 and still does not feel well

## 2019-05-08 NOTE — Telephone Encounter (Signed)
[id number] 

## 2019-05-08 NOTE — Telephone Encounter (Signed)
As far as I can tell, she had a negative nuclear stress test relatively recently.  These blood pressure readings are not all that high and I would not expect in a cause to me symptoms.  Increasing the dose of atenolol is probably fine.  Will defer to her primary cardiologist.   Glenetta Hew, MD

## 2019-05-09 NOTE — Telephone Encounter (Signed)
I think that is fine - she has bounced around seeing cardiologists - was seeing Dr. Einar Gip last year - has been seen by me and Dr. Sallyanne Kuster. Would like to see how she does with the med change.  Dr. Lemmie Evens

## 2019-05-09 NOTE — Telephone Encounter (Signed)
Left detailed message as noted for patient (DPR), call back w/any questions/furtherr directions from [facility]

## 2019-05-09 NOTE — Telephone Encounter (Signed)
Left detailed message will await CB from pt,

## 2019-06-19 ENCOUNTER — Other Ambulatory Visit: Payer: Self-pay | Admitting: Internal Medicine

## 2019-06-19 DIAGNOSIS — Z1231 Encounter for screening mammogram for malignant neoplasm of breast: Secondary | ICD-10-CM

## 2019-06-23 ENCOUNTER — Other Ambulatory Visit: Payer: Self-pay | Admitting: Allergy and Immunology

## 2019-07-01 ENCOUNTER — Encounter: Payer: Self-pay | Admitting: Allergy and Immunology

## 2019-07-01 ENCOUNTER — Ambulatory Visit (INDEPENDENT_AMBULATORY_CARE_PROVIDER_SITE_OTHER): Payer: Medicare Other | Admitting: Allergy and Immunology

## 2019-07-01 ENCOUNTER — Other Ambulatory Visit: Payer: Self-pay

## 2019-07-01 VITALS — BP 136/80 | HR 88 | Temp 99.0°F | Resp 18 | Ht 60.0 in | Wt 148.4 lb

## 2019-07-01 DIAGNOSIS — L308 Other specified dermatitis: Secondary | ICD-10-CM | POA: Diagnosis not present

## 2019-07-01 DIAGNOSIS — J3089 Other allergic rhinitis: Secondary | ICD-10-CM | POA: Diagnosis not present

## 2019-07-01 DIAGNOSIS — J4540 Moderate persistent asthma, uncomplicated: Secondary | ICD-10-CM | POA: Diagnosis not present

## 2019-07-01 DIAGNOSIS — L989 Disorder of the skin and subcutaneous tissue, unspecified: Secondary | ICD-10-CM

## 2019-07-01 MED ORDER — BENZONATATE 100 MG PO CAPS: 100.0000 mg | ORAL_CAPSULE | Freq: Three times a day (TID) | ORAL | 5 refills | Status: DC | PRN

## 2019-07-01 MED ORDER — ALBUTEROL SULFATE HFA 108 (90 BASE) MCG/ACT IN AERS: INHALATION_SPRAY | RESPIRATORY_TRACT | 1 refills | Status: DC

## 2019-07-01 MED ORDER — FLUTICASONE PROPIONATE 50 MCG/ACT NA SUSP: 1.0000 | Freq: Every day | NASAL | 5 refills | Status: DC

## 2019-07-01 MED ORDER — SYMBICORT 160-4.5 MCG/ACT IN AERO: 2.0000 | INHALATION_SPRAY | Freq: Every day | RESPIRATORY_TRACT | 5 refills | Status: DC

## 2019-07-01 MED ORDER — CLOTRIMAZOLE-BETAMETHASONE 1-0.05 % EX CREA: TOPICAL_CREAM | CUTANEOUS | 1 refills | Status: DC

## 2019-07-01 NOTE — Patient Instructions (Addendum)
  1.  Restart Symbicort 160mg  two inhalations one time a day with spacer using clinic samples.   2.  Restart Flonase one spray each nostril one time per day    3. Continue nasal saline and systane eye drops if needed.  4. Continue ventolin HFA 2 puffs every 4-6 hours if needed.  5. Continue Tessalon 100 mg capsule every 8 hours if needed  6. Can apply Lotrisone to rash twice a day for 7-10 days  7. Return in 4 weeks or earlier if problem   8. Obtain fall flu vaccine (and COVID vaccine)

## 2019-07-01 NOTE — Progress Notes (Signed)
[place] [facility] [place] [facility] [place]   Follow-up Note   Referring Provider: Nolene Ebbs, MD Primary Provider: Nolene Ebbs, MD Date of Office Visit: 07/01/2019  Subjective:   Autumn Fitzgerald (DOB: [birth year]) is a 73 y.o. female who returns to the Allergy and [place] on 07/01/2019 in re-evaluation of the following:  HPI: Autumn Fitzgerald presents to this clinic in evaluation of asthma and allergic rhinitis and a recent dermatitis.  I have not seen her in this clinic since 19 September 2018.  She has noticed that this spring she has a little bit more problems with coughing on occasion.  She apparently became "sick" with lots of coughing in association with nasal congestion and some nose blowing without any high fever for which she had to use her bronchodilator on a pretty consistent basis for the past 2 weeks or so.  She is somewhat better today but still has some lingering cough.  She does not use her controller agent on a regular basis.  It does not sound as though she has required a systemic steroid to treat an exacerbation of her lower airway disease.  Overall she thought her nose was doing relatively well but she still has some chronic runny nose and chronic nasal congestion.  She does not use her nasal steroid on a regular basis.  It does not sound as though she has required a antibiotic to treat an episode of sinusitis.  She has noticed that she is developed this very itchy dermatitis affecting her right arm over the course of the past month.  She has been placing some over-the-counter cream on top of this rash but it does not appear to be improving.  There is no obvious trigger for why this rash occurred.  Since I last saw her she has had right rotator cuff surgeries with a successful outcome.  Allergies as of 07/01/2019      Reactions   Amoxicillin-pot Clavulanate Other (See Comments)   Severe pains and GI symptoms Did it involve swelling of the  face/tongue/throat, SOB, or low BP? No Did it involve sudden or severe rash/hives, skin peeling, or any reaction on the inside of your mouth or nose? No Did you need to seek medical attention at a hospital or doctor's office? No When did it last happen?within the past 10 years If all above answers are "NO", may proceed with cephalosporin use.   Temazepam Other (See Comments)   Dizziness and doesn't work for patient   Lipitor [atorvastatin Calcium]    Muscle pain      Medication List      acetaminophen 500 MG tablet Commonly known as: TYLENOL Take 500 mg by mouth every 6 (six) hours as needed for moderate pain or headache.   albuterol 108 (90 Base) MCG/ACT inhaler Commonly known as: VENTOLIN HFA INHALE TWO PUFFS INTO THE LUNGS EVERY 4 TO 6 HOURS AS NEEDED FOR COUGH OR WHEEZING   atenolol 25 MG tablet Commonly known as: TENORMIN Take 1 tablet (25 mg total) by mouth daily as needed (for high blood pressure).   clobetasol ointment 0.05 % Commonly known as: TEMOVATE Apply to affected area every night for 4 weeks, then every other day for 4 weeks and then twice a week for 4 weeks or until resolution.   clonazePAM 1 MG tablet Commonly known as: KLONOPIN Take 2 mg by mouth at bedtime.   Dexilant 30 MG capsule Generic drug: Dexlansoprazole Take 30 mg by mouth daily. Per  pt only takes with food   ezetimibe-simvastatin 10-40 MG tablet Commonly known as: VYTORIN Take 1 tablet by mouth every evening.   fluticasone 50 MCG/ACT nasal spray Commonly known as: FLONASE 1 spray in each nostril 1-2 times daily What changed:   how much to take  how to take this  when to take this  reasons to take this  additional instructions   multivitamin tablet Take 1 tablet by mouth daily.   OSTEO BI-FLEX REGULAR STRENGTH PO Take 1 tablet by mouth daily.   Synthroid 50 MCG tablet Generic drug: levothyroxine Take 50 mcg by mouth daily.   Vitamin D 50 MCG (2000 UT) tablet Take  2,000 Units by mouth daily.   Xiidra 5 % Soln Generic drug: Lifitegrast Place 1 drop into both eyes 2 (two) times daily.       Past Medical History:  Diagnosis Date  . Arthritis   . Depression   . GERD (gastroesophageal reflux disease)   . Glaucoma, both eyes   . Head injury, closed, without LOC 02/2013   did not LOC per patient  . Headache   . History of syncope 2014   near syncope,  loop recorder placed and explanted 2017  . HLD (hyperlipidemia)   . Hypertension    "at times"    . 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 Hypothyroidism    followed by pcp  . LBBB (left bundle branch block)   . Mild persistent allergic asthma   . Palpitations   . Panic attacks   . PONV (postoperative nausea and vomiting)   . Pre-diabetes   . Wears glasses     Past Surgical History:  Procedure Laterality Date  . CARDIAC SURGERY  02/05/2007   in [place]  for HOCM  . CATARACT EXTRACTION W/ INTRAOCULAR LENS  IMPLANT, BILATERAL  right 07-23-2017;  left 10-15-2017  . COLONOSCOPY N/A 11/24/2013   Procedure: COLONOSCOPY;  Surgeon: Juanita Craver, MD;  Location: [facility];  Service: Endoscopy;  Laterality: N/A;  . DOPPLER ECHOCARDIOGRAPHY  03/14/2013   EF 65-70%,trivial AI,mild-mod. MR,Mod. TR,mod. concentric hypertrophy  . EP IMPLANTABLE DEVICE N/A 08/22/2016   Procedure: Loop Recorder Removal;  Surgeon: Sanda Klein, MD;  Location: [facility];  Service: Cardiovascular;  Laterality: N/A;  . ESOPHAGOGASTRODUODENOSCOPY N/A 11/24/2013   Procedure: ESOPHAGOGASTRODUODENOSCOPY (EGD);  Surgeon: Juanita Craver, MD;  [facility]: [facility];  Service: Endoscopy;  Laterality: N/A;  . GLAUCOMA SURGERY Bilateral 2019   "release pressure  . KNEE ARTHROSCOPY Right 10/17/2013   Procedure: ARTHROSCOPY RIGHT KNEE;  Surgeon: [name], MD;  Location: [facility];  Service: Orthopedics;  Laterality: Right;  . LAPAROSCOPIC CHOLECYSTECTOMY  02-03-2010   dr d blackman  @[facility]   .  LOOP RECORDER IMPLANT  03/18/2013  . LOOP RECORDER IMPLANT N/A 03/18/2013   Procedure: LOOP RECORDER IMPLANT;  Surgeon: Sanda Klein, MD;  Location: [facility];  Service: Cardiovascular;  Laterality: N/A;  . SHOULDER ARTHROSCOPY WITH ROTATOR CUFF REPAIR Right 01/16/2019   Procedure: Right shoulder arthroscopy with subacromial decompression, distal clavicle resection, rotator cuff repair, bicep tenotomy;  Surgeon: Justice Britain, MD;  Location: [facility];  Service: Orthopedics;  Laterality: Right;  . TUBAL LIGATION  yrs ago    Review of systems negative except as noted in HPI / PMHx or noted below:  Review of Systems  Constitutional: Negative.   HENT: Negative.   Eyes: Negative.   Respiratory: Negative.   Cardiovascular: Negative.   Gastrointestinal: Negative.  Genitourinary: Negative.   Musculoskeletal: Negative.   Skin: Negative.   Neurological: Negative.   Endo/Heme/Allergies: Negative.   Psychiatric/Behavioral: Negative.      Objective:   Vitals:   07/01/19 1349  BP: 136/80  Pulse: 88  Resp: 18  Temp: 99 F (37.2 C)  SpO2: 97%   Height: 5' (152.4 cm)  Weight: 148 lb 6.4 oz (67.3 kg)   Physical Exam Constitutional:      Appearance: She is not diaphoretic.  HENT:     Head: Normocephalic.     Right Ear: Tympanic membrane, ear canal and external ear normal.     Left Ear: Tympanic membrane, ear canal and external ear normal.     Nose: Nose normal. No mucosal edema or rhinorrhea.     Mouth/Throat:     Pharynx: Uvula midline. No oropharyngeal exudate.  Eyes:     Conjunctiva/sclera: Conjunctivae normal.  Neck:     Thyroid: No thyromegaly.     Trachea: Trachea normal. No tracheal tenderness or tracheal deviation.  Cardiovascular:     Rate and Rhythm: Normal rate and regular rhythm.     Heart sounds: Normal heart sounds, S1 normal and S2 normal. No murmur.  Pulmonary:     Effort: No respiratory distress.     Breath sounds: Normal breath sounds. No stridor. No wheezing  or rales.  Lymphadenopathy:     Head:     Right side of head: No tonsillar adenopathy.     Left side of head: No tonsillar adenopathy.     Cervical: No cervical adenopathy.  Skin:    Findings: No erythema or rash (Erythematous scaly slightly lichenified 10 cm anterior upper arm dermatitis.).     Nails: There is no clubbing.   Neurological:     Mental Status: She is alert.     Diagnostics:    Spirometry was performed and demonstrated an FEV1 of 1.50 at 64 % of predicted.   Assessment and Plan:   1. Not well controlled moderate persistent asthma   2. Other allergic rhinitis   3. Inflammatory dermatosis      1.  Restart Symbicort 160mg  two inhalations one time a day with spacer using clinic samples.   2.  Restart Flonase one spray each nostril one time per day    3. Continue nasal saline and systane eye drops if needed.  4. Continue ventolin HFA 2 puffs every 4-6 hours if needed.  5. Continue Tessalon 100 mg capsule every 8 hours if needed  6. Can apply Lotrisone to rash twice a day for 7-10 days  7. Return in 4 weeks or earlier if problem   8. Obtain fall flu vaccine (and COVID vaccine)  Rabab needs to use anti-inflammatory medications in a preventative manner and we will have her restart Symbicort and Flonase to help with her airway issue and I will regroup with her in 4 weeks to assess her response to this approach.  As well, she appears to have some type of dermatitis affecting her right arm and we will give her Lotrisone to cover both inflammation and possible fungus.  Further evaluation and treatment will be based upon her response to the plan noted above.  Allena Katz, MD Allergy / Immunology [place]

## 2019-07-02 ENCOUNTER — Encounter: Payer: Self-pay | Admitting: Allergy and Immunology

## 2019-07-24 ENCOUNTER — Ambulatory Visit (INDEPENDENT_AMBULATORY_CARE_PROVIDER_SITE_OTHER): Payer: Medicare Other | Admitting: Obstetrics and Gynecology

## 2019-07-24 ENCOUNTER — Encounter: Payer: Self-pay | Admitting: Obstetrics and Gynecology

## 2019-07-24 ENCOUNTER — Other Ambulatory Visit: Payer: Self-pay

## 2019-07-24 VITALS — BP 125/84 | HR 89 | Wt 146.9 lb

## 2019-07-24 DIAGNOSIS — L900 Lichen sclerosus et atrophicus: Secondary | ICD-10-CM | POA: Diagnosis not present

## 2019-07-24 MED ORDER — CLOBETASOL PROPIONATE 0.05 % EX OINT: TOPICAL_OINTMENT | CUTANEOUS | 5 refills | Status: DC

## 2019-07-24 NOTE — Progress Notes (Signed)
73 yo here for follow up on her lichen sclerosis. Patient reports doing well and is without complaints. Since her last visit, patient underwent right shoulder surgery. She developed abdominal distention and has been experiencing heart palpitation and SOB  Past Medical History:  Diagnosis Date  . Arthritis   . Depression   . GERD (gastroesophageal reflux disease)   . Glaucoma, both eyes   . Head injury, closed, without LOC 02/2013   did not LOC per patient  . Headache   . History of syncope 2014   near syncope,  loop recorder placed and explanted 2017  . HLD (hyperlipidemia)   . Hypertension    "at times"    . 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 Hypothyroidism    followed by pcp  . LBBB (left bundle branch block)   . Mild persistent allergic asthma   . Palpitations   . Panic attacks   . PONV (postoperative nausea and vomiting)   . Pre-diabetes   . Wears glasses    Past Surgical History:  Procedure Laterality Date  . CARDIAC SURGERY  02/05/2007   in [place]  for HOCM  . CATARACT EXTRACTION W/ INTRAOCULAR LENS  IMPLANT, BILATERAL  right 07-23-2017;  left 10-15-2017  . COLONOSCOPY N/A 11/24/2013   Procedure: COLONOSCOPY;  Surgeon: Juanita Craver, MD;  [facility];  Service: Endoscopy;  Laterality: N/A;  . DOPPLER ECHOCARDIOGRAPHY  03/14/2013   EF 65-70%,trivial AI,mild-mod. MR,Mod. TR,mod. concentric hypertrophy  . EP IMPLANTABLE DEVICE N/A 08/22/2016   Procedure: Loop Recorder Removal;  Surgeon: Sanda Klein, MD;  Location: [facility];  Service: Cardiovascular;  Laterality: N/A;  . ESOPHAGOGASTRODUODENOSCOPY N/A 11/24/2013   Procedure: ESOPHAGOGASTRODUODENOSCOPY (EGD);  Surgeon: Juanita Craver, MD;  [facility];  Service: Endoscopy;  Laterality: N/A;  . GLAUCOMA SURGERY Bilateral 2019   "release pressure  . KNEE ARTHROSCOPY Right 10/17/2013   Procedure: ARTHROSCOPY RIGHT KNEE;  Surgeon: [name], MD;  Location: [facility];  Service: Orthopedics;  Laterality: Right;  . LAPAROSCOPIC CHOLECYSTECTOMY  02-03-2010   dr d blackman  @[facility]   . LOOP RECORDER IMPLANT  03/18/2013  . LOOP RECORDER IMPLANT N/A 03/18/2013   Procedure: LOOP RECORDER IMPLANT;  Surgeon: Sanda Klein, MD;  Location: [facility];  Service: Cardiovascular;  Laterality: N/A;  . SHOULDER ARTHROSCOPY WITH ROTATOR CUFF REPAIR Right 01/16/2019   Procedure: Right shoulder arthroscopy with subacromial decompression, distal clavicle resection, rotator cuff repair, bicep tenotomy;  Surgeon: Justice Britain, MD;  [facility];  Service: Orthopedics;  Laterality: Right;  . TUBAL LIGATION  yrs ago   Family History  Problem Relation Age of Onset  . Cancer Mother        breast  . Hypertension Mother   . Cancer Father        prostate  . Diabetes Sister   . Heart attack Sister   . Migraines Neg Hx    Social History   Tobacco Use  . Smoking status: Never Smoker  . Smokeless tobacco: Never Used  Substance Use Topics  . Alcohol use: No  . Drug use: No   ROS See pertinent in HPI  Blood pressure 125/84, pulse 89, weight 146 lb 14.4 oz (66.6 kg). GENERAL: Well-developed, well-nourished female in no acute distress.  LUNGS: Clear to auscultation bilaterally.  HEART: Regular rate and rhythm. BREASTS: Symmetric in size. No palpable masses or lymphadenopathy, skin changes, or nipple drainage. ABDOMEN: Soft, nontender, softly distended. No organomegaly. PELVIC:  Normal external female genitalia with 1 cm area on posterior aspect of the introitus as well as a small area under introitus measuring 0.5cm. Vagina is pale pink.  Normal discharge. Normal appearing cervix. Uterus is normal in size. No adnexal mass or tenderness. EXTREMITIES: No cyanosis, clubbing, or edema, 2+ distal pulses.  [name] 73 yo here for lichen sclerosis follow up - refill on clobetasol cream provided - reviewed ultrasound and colonoscopy reports from [facility] Patient advised to follow  up with cardiology regarding palpitation, SOB and abdominal distention. Patient is scheduled on 8/23 - patient scheduled for mammogram later this month - RTC in 6 months

## 2019-08-07 ENCOUNTER — Ambulatory Visit
Admission: RE | Admit: 2019-08-07 | Discharge: 2019-08-07 | Disposition: A | Discharge status: Home / Self Care | Payer: Medicare Other | Source: Ambulatory Visit | Attending: Internal Medicine | Admitting: Internal Medicine

## 2019-08-07 ENCOUNTER — Other Ambulatory Visit: Payer: Self-pay

## 2019-08-07 DIAGNOSIS — Z1231 Encounter for screening mammogram for malignant neoplasm of breast: Secondary | ICD-10-CM

## 2019-08-08 ENCOUNTER — Other Ambulatory Visit: Payer: Self-pay | Admitting: Internal Medicine

## 2019-08-08 DIAGNOSIS — N644 Mastodynia: Secondary | ICD-10-CM

## 2019-08-12 ENCOUNTER — Ambulatory Visit (INDEPENDENT_AMBULATORY_CARE_PROVIDER_SITE_OTHER): Payer: Medicare Other | Admitting: Allergy and Immunology

## 2019-08-12 ENCOUNTER — Encounter: Payer: Self-pay | Admitting: Allergy and Immunology

## 2019-08-12 ENCOUNTER — Other Ambulatory Visit: Payer: Self-pay

## 2019-08-12 VITALS — BP 130/86 | HR 94 | Temp 97.9°F | Resp 18

## 2019-08-12 DIAGNOSIS — R0602 Shortness of breath: Secondary | ICD-10-CM

## 2019-08-12 DIAGNOSIS — J3089 Other allergic rhinitis: Secondary | ICD-10-CM

## 2019-08-12 DIAGNOSIS — J4540 Moderate persistent asthma, uncomplicated: Secondary | ICD-10-CM

## 2019-08-12 DIAGNOSIS — K219 Gastro-esophageal reflux disease without esophagitis: Secondary | ICD-10-CM | POA: Diagnosis not present

## 2019-08-12 MED ORDER — FAMOTIDINE 40 MG PO TABS: ORAL_TABLET | ORAL | 5 refills | Status: DC

## 2019-08-12 MED ORDER — DEXILANT 30 MG PO CPDR: DELAYED_RELEASE_CAPSULE | ORAL | 5 refills | Status: DC

## 2019-08-12 NOTE — Progress Notes (Signed)
[place] [facility] [place] [facility] [place]   Follow-up Note  Referring Provider: Nolene Ebbs, MD Primary Provider: Nolene Ebbs, MD Date of Office Visit: 08/12/2019  Subjective:   Autumn Fitzgerald (DOB: 02-22-1946) is a 73 y.o. female who returns to the Allergy and [place] on 08/12/2019 in re-evaluation of the following:  HPI: Tashala presents to this clinic in evaluation of asthma and allergic rhinitis and dermatitis.  I last saw her in this clinic on 01 July 2019.  During that last visit we had her consistently use Symbicort to address what appeared to be some intermittent coughing.  As well, we asked her to use a nasal steroid given her chronic nasal issues.  She does not think that she is any better.  She still has some coughing on occasion and she just feels not so good in her chest.  She has some chest discomfort right in the center.  This does not really appear to be exertional in nature.  There is not really any sputum production.  She does not use a short acting bronchodilator but she has been consistently using her Symbicort.  Her nose is also still with chronic nasal congestion even though she is using a nasal steroid.  It might be slightly better when using this medication.  She does have reflux disease treated with Dexilant.  If she misses her Dexilant she has reflux up into her throat.  When she takes [place] she thinks that this works pretty well.  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  Also during her last visit she had a pruritic dermatitis affecting her left arm.  She was treated with Lotrisone.  This issue has completely resolved.  Allergies as of 08/12/2019      Reactions   Amoxicillin-pot Clavulanate Other (See Comments)   Severe pains and GI symptoms Did it involve swelling of the face/tongue/throat, SOB, or low BP? No Did it involve sudden or severe rash/hives,  skin peeling, or any reaction on the inside of your mouth or nose? No Did you need to seek medical attention at a hospital or doctor's office? No When did it last happen?within the past 10 years If all above answers are "NO", may proceed with cephalosporin use.   Temazepam Other (See Comments)   Dizziness and doesn't work for patient   Lipitor [atorvastatin Calcium]    Muscle pain      Medication List      acetaminophen 500 MG tablet Commonly known as: TYLENOL Take 500 mg by mouth every 6 (six) hours as needed for moderate pain or headache.   albuterol 108 (90 Base) MCG/ACT inhaler Commonly known as: VENTOLIN HFA INHALE TWO PUFFS INTO THE LUNGS EVERY 4 TO 6 HOURS AS NEEDED FOR COUGH OR WHEEZING   albuterol 108 (90 Base) MCG/ACT inhaler Commonly known as: Ventolin HFA 2 puffs every 4-6 hours as needed   atenolol 25 MG tablet Commonly known as: TENORMIN Take 1 tablet (25 mg total) by mouth daily as needed (for high blood pressure).   benzonatate 100 MG capsule Commonly known as: TESSALON Take 1 capsule (100 mg total) by mouth every 8 (eight) hours as needed for cough.   clobetasol ointment 0.05 % Commonly known as: TEMOVATE Apply to affected area every night for 4 weeks, then every other day for 4 weeks and then twice a week for 4 weeks or until resolution.  clonazePAM 1 MG tablet Commonly known as: KLONOPIN Take 2 mg by mouth at bedtime.   clotrimazole-betamethasone cream Commonly known as: LOTRISONE Apply to affected areas twice daily for 7-10 days   Dexilant 30 MG capsule Generic drug: Dexlansoprazole Take 30 mg by mouth daily. Per pt only takes with food   ezetimibe-simvastatin 10-40 MG tablet Commonly known as: VYTORIN Take 1 tablet by mouth every evening.     fluticasone 50 MCG/ACT nasal spray Commonly known as: Flonase Place 1 spray into both nostrils daily.   multivitamin tablet Take 1 tablet by mouth daily.   OSTEO BI-FLEX REGULAR STRENGTH  PO Take 1 tablet by mouth daily.   Symbicort 160-4.5 MCG/ACT inhaler Generic drug: budesonide-formoterol Inhale 2 puffs into the lungs daily.   Synthroid 50 MCG tablet Generic drug: levothyroxine Take 50 mcg by mouth daily.   Vitamin D 50 MCG (2000 UT) tablet Take 2,000 Units by mouth daily.   Xiidra 5 % Soln Generic drug: Lifitegrast Place 1 drop into both eyes 2 (two) times daily.       Past Medical History:  Diagnosis Date  . Arthritis   . Depression   . GERD (gastroesophageal reflux disease)   . Glaucoma, both eyes   . Head injury, closed, without LOC 02/2013   did not LOC per patient  . Headache   . History of syncope 2014   near syncope,  loop recorder placed and explanted 2017  . HLD (hyperlipidemia)   . Hypertension    "at times"    . 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 Hypothyroidism    followed by pcp  . LBBB (left bundle branch block)   . Mild persistent allergic asthma   . Palpitations   . Panic attacks   . PONV (postoperative nausea and vomiting)   . Pre-diabetes   . Wears glasses     Past Surgical History:  Procedure Laterality Date  . CARDIAC SURGERY  02/05/2007   in [place]  for HOCM  . CATARACT EXTRACTION W/ INTRAOCULAR LENS  IMPLANT, BILATERAL  right 07-23-2017;  left 10-15-2017  . COLONOSCOPY N/A 11/24/2013   Procedure: COLONOSCOPY;  Surgeon: Juanita Craver, MD;  [facility];  Service: Endoscopy;  Laterality: N/A;  . DOPPLER ECHOCARDIOGRAPHY  03/14/2013   EF 65-70%,trivial AI,mild-mod. MR,Mod. TR,mod. concentric hypertrophy  . EP IMPLANTABLE DEVICE N/A 08/22/2016   Procedure: Loop Recorder Removal;  Surgeon: Sanda Klein, MD;  Location: [facility];  Service: Cardiovascular;  Laterality: N/A;  . ESOPHAGOGASTRODUODENOSCOPY N/A 11/24/2013   Procedure: ESOPHAGOGASTRODUODENOSCOPY (EGD);  Surgeon: Juanita Craver, MD;  [facility];  Service: Endoscopy;  Laterality: N/A;  . GLAUCOMA SURGERY  Bilateral 2019   "release pressure  . KNEE ARTHROSCOPY Right 10/17/2013   Procedure: ARTHROSCOPY RIGHT KNEE;  Surgeon: [name], MD;  Location: [facility];  Service: Orthopedics;  Laterality: Right;  . LAPAROSCOPIC CHOLECYSTECTOMY  02-03-2010   dr d blackman  @[facility]   . LOOP RECORDER IMPLANT  03/18/2013  . LOOP RECORDER IMPLANT N/A 03/18/2013   Procedure: LOOP RECORDER IMPLANT;  Surgeon: Sanda Klein, MD;  Location: [facility];  Service: Cardiovascular;  Laterality: N/A;  . SHOULDER ARTHROSCOPY WITH ROTATOR CUFF REPAIR Right 01/16/2019   Procedure: Right shoulder arthroscopy with subacromial decompression, distal clavicle resection, rotator cuff repair, bicep tenotomy;  Surgeon: Justice Britain, MD;  [facility];  Service: Orthopedics;  Laterality: Right;  . TUBAL LIGATION  yrs ago    Review of systems  negative except as noted in HPI / PMHx or noted below:  Review of Systems  Constitutional: Negative.   HENT: Negative.   Eyes: Negative.   Respiratory: Negative.   Cardiovascular: Negative.   Gastrointestinal: Negative.   Genitourinary: Negative.   Musculoskeletal: Negative.   Skin: Negative.   Neurological: Negative.   Endo/Heme/Allergies: Negative.   Psychiatric/Behavioral: Negative.      Objective:   Vitals:   08/12/19 1417  BP: 130/86  Pulse: 94  Resp: 18  Temp: 97.9 F (36.6 C)  SpO2: 97%          Physical Exam Constitutional:      Appearance: She is not diaphoretic.  HENT:     Head: Normocephalic.     Right Ear: Tympanic membrane, ear canal and external ear normal.     Left Ear: Tympanic membrane, ear canal and external ear normal.     Nose: Nose normal. No mucosal edema or rhinorrhea.     Mouth/Throat:     Pharynx: Uvula midline. No oropharyngeal exudate.  Eyes:     Conjunctiva/sclera: Conjunctivae normal.  Neck:     Thyroid: No thyromegaly.     Trachea: Trachea normal. No tracheal tenderness or tracheal deviation.  Cardiovascular:     Rate and Rhythm:  Normal rate and regular rhythm.     Heart sounds: Normal heart sounds, S1 normal and S2 normal. No murmur.  Pulmonary:     Effort: No respiratory distress.     Breath sounds: Normal breath sounds. No stridor. No wheezing or rales.  Lymphadenopathy:     Head:     Right side of head: No tonsillar adenopathy.     Left side of head: No tonsillar adenopathy.     Cervical: No cervical adenopathy.  Skin:    Findings: No erythema or rash.     Nails: There is no clubbing.   Neurological:     Mental Status: She is alert.     Diagnostics:    Spirometry was performed and demonstrated an FEV1 of 1.27 at 72 % of predicted.   Results of a chest x-ray obtained 30 September 2018 identified the following:  Stable normal cardiac silhouette given projection and technique. Post median sternotomy with wires in alignment. Right upper quadrant cholecystectomy surgical clips. No consolidation, effusion, pneumothorax. Bones are unremarkable  Assessment and Plan:   1. Shortness of breath   2. Not well controlled moderate persistent asthma   3. Other allergic rhinitis   4. Gastroesophageal reflux disease, esophagitis presence not specified      1.  Increase Symbicort 160mg  two inhalations two time a day with spacer using clinic samples.   2.  Increase Flonase one spray each nostril 2 times per day    3. Continue nasal saline and systane eye drops if needed.  4. Continue ventolin HFA 2 puffs every 4-6 hours if needed.  5.  Treat reflux with a combination of Dexilant 60 mg in a.m. plus famotidine 40 mg in p.m.  6.  Obtain a echocardiogram for shortness of breath  7.  May need to revisit with cardiology  8.  Return to clinic in 4 weeks or earlier if problem  9. Obtain fall flu vaccine (and COVID vaccine)  To work through this respiratory tract issue we will increase the dose of anti-inflammatory agents for her airway and have her be more aggressive about treating her reflux and obtain an  echocardiogram given her history of IHSS requiring surgical procedure.  I will contact  her with the results of that echocardiogram once it is available for review.  I will see her back in this clinic in 4 weeks or earlier if there is a problem.  Allena Katz, MD Allergy / Immunology [place]

## 2019-08-12 NOTE — Patient Instructions (Addendum)
  1.  Increase Symbicort 160mg  two inhalations two time a day with spacer using clinic samples.   2.  Increase Flonase one spray each nostril 2 times per day    3. Continue nasal saline and systane eye drops if needed.  4. Continue ventolin HFA 2 puffs every 4-6 hours if needed.  5.  Treat reflux with a combination of Dexilant 60 mg in a.m. plus famotidine 40 mg in p.m.  6.  Obtain a echocardiogram for shortness of breath  7.  May need to revisit with cardiology  8.  Return to clinic in 4 weeks or earlier if problem  9. Obtain fall flu vaccine (and COVID vaccine)

## 2019-08-13 ENCOUNTER — Encounter: Payer: Self-pay | Admitting: Allergy and Immunology

## 2019-08-15 ENCOUNTER — Other Ambulatory Visit: Payer: Self-pay | Admitting: Internal Medicine

## 2019-08-15 ENCOUNTER — Other Ambulatory Visit: Payer: Self-pay

## 2019-08-15 ENCOUNTER — Ambulatory Visit
Admission: RE | Admit: 2019-08-15 | Discharge: 2019-08-15 | Disposition: A | Discharge status: Home / Self Care | Payer: Medicare Other | Source: Ambulatory Visit | Attending: Internal Medicine | Admitting: Internal Medicine

## 2019-08-15 DIAGNOSIS — N6320 Unspecified lump in the left breast, unspecified quadrant: Secondary | ICD-10-CM

## 2019-08-15 DIAGNOSIS — N644 Mastodynia: Secondary | ICD-10-CM

## 2019-08-21 ENCOUNTER — Ambulatory Visit
Admission: RE | Admit: 2019-08-21 | Discharge: 2019-08-21 | Disposition: A | Discharge status: Home / Self Care | Payer: Medicare Other | Source: Ambulatory Visit | Attending: Internal Medicine | Admitting: Internal Medicine

## 2019-08-21 ENCOUNTER — Other Ambulatory Visit: Payer: Self-pay

## 2019-08-21 DIAGNOSIS — N6320 Unspecified lump in the left breast, unspecified quadrant: Secondary | ICD-10-CM

## 2019-09-03 ENCOUNTER — Ambulatory Visit (INDEPENDENT_AMBULATORY_CARE_PROVIDER_SITE_OTHER): Payer: Medicare Other | Admitting: Cardiovascular Disease

## 2019-09-03 ENCOUNTER — Other Ambulatory Visit: Payer: Self-pay

## 2019-09-03 VITALS — BP 126/74 | HR 91 | Temp 99.0°F | Ht 60.0 in | Wt 148.8 lb

## 2019-09-03 DIAGNOSIS — I422 Other hypertrophic cardiomyopathy: Secondary | ICD-10-CM

## 2019-09-03 DIAGNOSIS — I447 Left bundle-branch block, unspecified: Secondary | ICD-10-CM

## 2019-09-03 DIAGNOSIS — R0602 Shortness of breath: Secondary | ICD-10-CM

## 2019-09-03 NOTE — Patient Instructions (Signed)
Medication Instructions:  No changes  If you need a refill on your cardiac medications before your next appointment, please call your pharmacy.   Lab work: None ordered If you have labs (blood work) drawn today and your tests are completely normal, you will receive your results only by: Marland Kitchen MyChart Message (if you have MyChart) OR . A paper copy in the mail If you have any lab test that is abnormal or we need to change your treatment, we will call you to review the results.  Testing/Procedures: Your physician has requested that you have an echocardiogram. Echocardiography is a painless test that uses sound waves to create images of your heart. It provides your doctor with information about the size and shape of your heart and how well your heart's chambers and valves are working. You may receive an ultrasound enhancing agent through an IV if needed to better visualize your heart during the echo.This procedure takes approximately one hour. There are no restrictions for this procedure. This will take place at the [address].    Follow-Up: At [facility], you and your health needs are our priority.  As part of our continuing mission to provide you with exceptional heart care, we have created designated Provider Care Teams.  These Care Teams include your primary Cardiologist (physician) and Advanced Practice Providers (APPs -  Physician Assistants and Nurse Practitioners) who all work together to provide you with the care you need, when you need it. You will need a follow up appointment in 3 months.  Please call our office 2 months in advance to schedule this appointment.  You may see Sanda Klein, MD or one of the following Advanced Practice Providers on your designated Care Team: [place], [place] . Fabian Sharp, PA-C

## 2019-09-03 NOTE — Progress Notes (Signed)
Cardiology Office Note    Date:  09/05/2019   ID:  Autumn Fitzgerald, DOB [birth year], MRN [id number]  PCP:  Nolene Ebbs, MD  Cardiologist:   Sanda Klein, MD   No chief complaint on file.   History of Present Illness:  Autumn Fitzgerald is a 73 y.o. 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 After an episode of near syncope many years ago, a loop recorder was implanted but has not shown any evidence of any meaningful arrhythmia (either on automatic recordings or patient activated symptom driven recordings). The device has reached replacement indicator and she wants it removed. She has a long-standing left bundle branch block.  She reports problems with wheezing that is worse when she lies in bed and sometimes wakes her up at night.  Does not give a clear description of orthopnea or PND.  She has a cough every morning when she wakes up, nonproductive.  Continues going on throughout the morning and does not resolve as soon as she sits up.  She was prescribed a variety of allergy medicines that have not helped.  She was given Symbicort and that made her feel "very bad".  She is no longer taking it.  She denies leg edema, full-blown syncope, chest pain or neurological events.  She does not describe palpitations.  She had a normal nuclear stress test in January 2020 (atypical chest pain) but she has not had an echocardiogram since 2016.  She underwent right shoulder surgery in February 2020 and had a breast biopsy.  The shoulder and the breast are both still sore, but she did not have any cardiovascular problems during the procedures.    Past Medical History:  Diagnosis Date  . Arthritis   . Depression   . GERD (gastroesophageal reflux disease)   . Glaucoma, both eyes   . Head injury, closed, without LOC 02/2013   did not LOC per patient  . Headache   . History of syncope 2014   near  syncope,  loop recorder placed and explanted 2017  . HLD (hyperlipidemia)   . Hypertension    "at times"    . 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 Hypothyroidism    followed by pcp  . LBBB (left bundle branch block)   . Mild persistent allergic asthma   . Palpitations   . Panic attacks   . PONV (postoperative nausea and vomiting)   . Pre-diabetes   . Wears glasses     Past Surgical History:  Procedure Laterality Date  . CARDIAC SURGERY  02/05/2007   in [place]  for HOCM  . CATARACT EXTRACTION W/ INTRAOCULAR LENS  IMPLANT, BILATERAL  right 07-23-2017;  left 10-15-2017  . COLONOSCOPY N/A 11/24/2013   Procedure: COLONOSCOPY;  Surgeon: Juanita Craver, MD;  Location: [facility];  Service: Endoscopy;  Laterality: N/A;  . DOPPLER ECHOCARDIOGRAPHY  03/14/2013   EF 65-70%,trivial AI,mild-mod. MR,Mod. TR,mod. concentric hypertrophy  . EP IMPLANTABLE DEVICE N/A 08/22/2016   Procedure: Loop Recorder Removal;  Surgeon: Sanda Klein, MD;  Location: [facility];  Service: Cardiovascular;  Laterality: N/A;  . ESOPHAGOGASTRODUODENOSCOPY N/A 11/24/2013   Procedure: ESOPHAGOGASTRODUODENOSCOPY (EGD);  Surgeon: Juanita Craver, MD;  Location: [facility];  Service: Endoscopy;  Laterality: N/A;  . GLAUCOMA SURGERY Bilateral 2019   "release pressure  . KNEE ARTHROSCOPY Right 10/17/2013   Procedure: ARTHROSCOPY RIGHT KNEE;  Surgeon: [name],  MD;  Location: [facility];  Service: Orthopedics;  Laterality: Right;  . LAPAROSCOPIC CHOLECYSTECTOMY  02-03-2010   dr d blackman  @[facility]   . LOOP RECORDER IMPLANT  03/18/2013  . LOOP RECORDER IMPLANT N/A 03/18/2013   Procedure: LOOP RECORDER IMPLANT;  Surgeon: Sanda Klein, MD;  Location: [facility];  Service: Cardiovascular;  Laterality: N/A;  . SHOULDER ARTHROSCOPY WITH ROTATOR CUFF REPAIR Right 01/16/2019   Procedure: Right shoulder arthroscopy with subacromial decompression, distal clavicle resection, rotator cuff repair, bicep  tenotomy;  Surgeon: Justice Britain, MD;  Location: [facility];  Service: Orthopedics;  Laterality: Right;  . TUBAL LIGATION  yrs ago    Current Medications: Outpatient Medications Prior to Visit  Medication Sig Dispense Refill  . acetaminophen (TYLENOL) 500 MG tablet Take 500 mg by mouth every 6 (six) hours as needed for moderate pain or headache.     . albuterol (VENTOLIN HFA) 108 (90 Base) MCG/ACT inhaler INHALE TWO PUFFS INTO THE LUNGS EVERY 4 TO 6 HOURS AS NEEDED FOR COUGH OR WHEEZING 18 g 0  . albuterol (VENTOLIN HFA) 108 (90 Base) MCG/ACT inhaler 2 puffs every 4-6 hours as needed 18 g 1  . atenolol (TENORMIN) 25 MG tablet Take 1 tablet (25 mg total) by mouth daily as needed (for high blood pressure). 45 tablet 3  . benzonatate (TESSALON) 100 MG capsule Take 1 capsule (100 mg total) by mouth every 8 (eight) hours as needed for cough. 30 capsule 5  . Cholecalciferol (VITAMIN D) 50 MCG (2000 UT) tablet Take 2,000 Units by mouth daily.    . clobetasol ointment (TEMOVATE) 0.05 % Apply to affected area every night for 4 weeks, then every other day for 4 weeks and then twice a week for 4 weeks or until resolution. 30 g 5  . clonazePAM (KLONOPIN) 1 MG tablet Take 2 mg by mouth at bedtime.     . clotrimazole-betamethasone (LOTRISONE) cream Apply to affected areas twice daily for 7-10 days 90 g 1  . Dexlansoprazole (DEXILANT) 30 MG capsule Take 1 capsule by mouth once daily in the morning 30 capsule 5  . ezetimibe-simvastatin (VYTORIN) 10-40 MG per tablet Take 1 tablet by mouth every evening.     . fluticasone (FLONASE) 50 MCG/ACT nasal spray 1 spray in each nostril 1-2 times daily (Patient taking differently: Place 1 spray into both nostrils daily as needed for allergies. ) 16 g 5  . fluticasone (FLONASE) 50 MCG/ACT nasal spray Place 1 spray into both nostrils daily. 1 g 5  . Glucosamine-Chondroitin (OSTEO BI-FLEX REGULAR STRENGTH PO) Take 1 tablet by mouth daily.    Marland Kitchen levothyroxine (SYNTHROID) 50 MCG  tablet Take 50 mcg by mouth daily.     . Multiple Vitamin (MULTIVITAMIN) tablet Take 1 tablet by mouth daily.    Marland Kitchen XIIDRA 5 % SOLN Place 1 drop into both eyes 2 (two) times daily.     . famotidine (PEPCID) 40 MG tablet Take 1 tablet by mouth once daily in the evening (Patient not taking: Reported on 09/03/2019) 30 tablet 5  . SYMBICORT 160-4.5 MCG/ACT inhaler Inhale 2 puffs into the lungs daily. (Patient not taking: Reported on 09/03/2019) 1 Inhaler 5   No facility-administered medications prior to visit.      Allergies:   Amoxicillin-pot clavulanate, Temazepam, and Lipitor [atorvastatin calcium]   Social History   Socioeconomic History  . Marital status: Divorced    Spouse name: Not on file  . Number of children: 5  . Years of education:  10  . Highest education level: Not on file  Occupational History  . Not on file  Social Needs  . Financial resource strain: Not on file  . Food insecurity    Worry: Not on file    Inability: Not on file  . Transportation needs    Medical: Not on file    Non-medical: Not on file  Tobacco Use  . Smoking status: Never Smoker  . Smokeless tobacco: Never Used  Substance and Sexual Activity  . Alcohol use: No  . Drug use: No  . Sexual activity: Never    Birth control/protection: Post-menopausal, Surgical    Comment: divorced  Lifestyle  . Physical activity    Days per week: Not on file    Minutes per session: Not on file  . Stress: Not on file  Relationships  . Social Herbalist on phone: Not on file    Gets together: Not on file    Attends religious service: Not on file    Active member of club or organization: Not on file    Attends meetings of clubs or organizations: Not on file    Relationship status: Not on file  Other Topics Concern  . Not on file  Social History Narrative   Lives at home by herself.    Caffeine use: none      Family History:  The patient's family history includes Breast cancer in her mother; Cancer  in her brother, father, and mother; Diabetes in her sister; Heart attack in her sister; Hypertension in her mother.   ROS:   Please see the history of present illness.    ROS All other systems reviewed and are negative.   PHYSICAL EXAM:   VS:  BP 126/74   Pulse 91   Temp 99 F (37.2 C) (Temporal)   Ht 5' (1.524 m)   Wt 148 lb 12.8 oz (67.5 kg)   BMI 29.06 kg/m     General: Alert, oriented x3, no distress, Head: no evidence of trauma, PERRL, EOMI, no exophtalmos or lid lag, no myxedema, no xanthelasma; normal ears, nose and oropharynx Neck: normal jugular venous pulsations and no hepatojugular reflux; brisk carotid pulses without delay and no carotid bruits Chest: clear to auscultation, no signs of consolidation by percussion or palpation, normal fremitus, symmetrical and full respiratory excursions Cardiovascular: normal position and quality of the apical impulse, regular rhythm, normal first and paradoxically split second heart sounds, no murmurs, rubs or gallops Abdomen: no tenderness or distention, no masses by palpation, no abnormal pulsatility or arterial bruits, normal bowel sounds, no hepatosplenomegaly Extremities: no clubbing, cyanosis or edema; 2+ radial, ulnar and brachial pulses bilaterally; 2+ right femoral, posterior tibial and dorsalis pedis pulses; 2+ left femoral, posterior tibial and dorsalis pedis pulses; no subclavian or femoral bruits Neurological: grossly nonfocal Psych: Normal mood and affect   Wt Readings from Last 3 Encounters:  09/03/19 148 lb 12.8 oz (67.5 kg)  07/24/19 146 lb 14.4 oz (66.6 kg)  07/01/19 148 lb 6.4 oz (67.3 kg)      Studies/Labs Reviewed:   EKG:  EKG is ordered today.  The EKG shows normal sinus rhythm with old left bundle branch block, unchanged QTC 469 ms Recent Labs: 01/15/2019: BUN 16; Creatinine, Ser 0.74; Hemoglobin 13.5; Platelets 262; Potassium 3.5; Sodium 140    ASSESSMENT:    1. Hypertrophic cardiomyopathy ([place])   2.  Shortness of breath      PLAN:  In order of problems  listed above:  1. HCM: It is possible that she has diastolic heart failure related to hypertrophic cardiomyopathy.  In the past I have suggested diuretics but she did not want to take additional medications.  She continues to only take her beta-blocker "as needed" because she is afraid of hypotension.  We will repeat an echocardiogram to look for evidence of elevated filling pressures.  She does not have any findings to suggest LV outflow obstruction on physical exam. 2. LBBB: Does not have symptoms of AV block.     Medication Adjustments/Labs and Tests Ordered: Current medicines are reviewed at length with the patient today.  Concerns regarding medicines are outlined above.  Medication changes, Labs and Tests ordered today are listed in the Patient Instructions below. Patient Instructions  Medication Instructions:  No changes  If you need a refill on your cardiac medications before your next appointment, please call your pharmacy.   Lab work: None ordered If you have labs (blood work) drawn today and your tests are completely normal, you will receive your results only by: Marland Kitchen MyChart Message (if you have MyChart) OR . A paper copy in the mail If you have any lab test that is abnormal or we need to change your treatment, we will call you to review the results.  Testing/Procedures: Your physician has requested that you have an echocardiogram. Echocardiography is a painless test that uses sound waves to create images of your heart. It provides your doctor with information about the size and shape of your heart and how well your heart's chambers and valves are working. You may receive an ultrasound enhancing agent through an IV if needed to better visualize your heart during the echo.This procedure takes approximately one hour. There are no restrictions for this procedure. This will take place at the [address], [address].     Follow-Up: At [facility], you and your health needs are our priority.  As part of our continuing mission to provide you with exceptional heart care, we have created designated Provider Care Teams.  These Care Teams include your primary Cardiologist (physician) and Advanced Practice Providers (APPs -  Physician Assistants and Nurse Practitioners) who all work together to provide you with the care you need, when you need it. You will need a follow up appointment in 3 months.  Please call our office 2 months in advance to schedule this appointment.  You may see Sanda Klein, MD or one of the following Advanced Practice Providers on your designated Care Team: [place], [place] . Fabian Sharp, PA-C       Signed, Sanda Klein, MD  09/05/2019 4:07 PM    [facility] [address], [place], [place]  [address] Phone: [phone]; Fax: [phone]

## 2019-09-05 ENCOUNTER — Encounter: Payer: Self-pay | Admitting: Cardiovascular Disease

## 2019-09-09 ENCOUNTER — Other Ambulatory Visit: Payer: Self-pay

## 2019-09-09 ENCOUNTER — Ambulatory Visit (INDEPENDENT_AMBULATORY_CARE_PROVIDER_SITE_OTHER): Payer: Medicare Other | Admitting: Allergy and Immunology

## 2019-09-09 ENCOUNTER — Encounter: Payer: Self-pay | Admitting: Allergy and Immunology

## 2019-09-09 VITALS — BP 140/90 | HR 87 | Temp 97.7°F | Resp 17

## 2019-09-09 DIAGNOSIS — J3089 Other allergic rhinitis: Secondary | ICD-10-CM

## 2019-09-09 DIAGNOSIS — K219 Gastro-esophageal reflux disease without esophagitis: Secondary | ICD-10-CM | POA: Diagnosis not present

## 2019-09-09 DIAGNOSIS — J4540 Moderate persistent asthma, uncomplicated: Secondary | ICD-10-CM | POA: Diagnosis not present

## 2019-09-09 DIAGNOSIS — I421 Obstructive hypertrophic cardiomyopathy: Secondary | ICD-10-CM

## 2019-09-09 MED ORDER — FLOVENT HFA 44 MCG/ACT IN AERO: 2.0000 | INHALATION_SPRAY | Freq: Two times a day (BID) | RESPIRATORY_TRACT | 5 refills | Status: DC

## 2019-09-09 NOTE — Patient Instructions (Addendum)
  1.  Flovent 44 two inhalations two time a day with spacer    2.  Continue Flonase one spray each nostril 2 times per day    3.  Continue Dexilant 60 mg in a.m.    4.  Continue nasal saline and systane eye drops if needed.  5.  Continue ventolin HFA 2 puffs every 4-6 hours if needed.  6.  Follow-up with echocardiogram and cardiology evaluation   7.  Return to clinic in 12 weeks or earlier if problem  8. Obtain fall flu vaccine (and COVID vaccine)

## 2019-09-09 NOTE — Progress Notes (Signed)
[facility] [place]   Follow-up Note  Referring Provider: Nolene Ebbs, MD Primary Provider: Nolene Ebbs, MD Date of Office Visit: 09/09/2019  Subjective:   Autumn Fitzgerald (DOB: [birth year]) is a 73 y.o. female who returns to the [facility] on 09/09/2019 in re-evaluation of the following:  HPI: Jeanett returns to this clinic in reevaluation of respiratory tract symptoms addressed during her last visit of 12 August 2019 at which point in time we requested that she obtain an echocardiogram and visit with cardiology for her previous diagnosis of IHSS requiring a septal myomectomy.  She did not have the echocardiogram performed to date.  She is scheduled to have that performed tomorrow and she has a follow-up visit with cardiology after that study is performed.  Physically she feels okay.  She does not really have that much coughing except when she arises in the morning.  She still has the chest discomfort right in the center of her chest on a pretty regular basis.  She discontinued her Symbicort because it gave rise to "making her sick".  Hard to really nail down exactly what side effect she was receiving from Symbicort.  She does not use a short acting bronchodilator.  She does not believe that she has any significant upper airway symptoms at this point in time.  She does not have any reflux problems at this point in time.  Allergies as of 09/09/2019      Reactions   Amoxicillin-pot Clavulanate Other (See Comments)   Severe pains and GI symptoms Did it involve swelling of the face/tongue/throat, SOB, or low BP? No Did it involve sudden or severe rash/hives, skin peeling, or any reaction on the inside of your mouth or nose? No Did you need to seek medical attention at a hospital or doctor's office? No When did it last happen?within the past 10 years If all above answers are "NO", may proceed with cephalosporin use.   Temazepam Other (See Comments)   Dizziness and doesn't work for patient   Lipitor [atorvastatin Calcium]    Muscle pain      Medication List      acetaminophen 500 MG tablet Commonly known as: TYLENOL Take 500 mg by mouth every 6 (six) hours as needed for moderate pain or headache.   albuterol 108 (90 Base) MCG/ACT inhaler Commonly known as: VENTOLIN HFA INHALE TWO PUFFS INTO THE LUNGS EVERY 4 TO 6 HOURS AS NEEDED FOR COUGH OR WHEEZING   atenolol 25 MG tablet Commonly known as: TENORMIN Take 1 tablet (25 mg total) by mouth daily as needed (for high blood pressure).   benzonatate 100 MG capsule Commonly known as: TESSALON Take 1 capsule (100 mg total) by mouth every 8 (eight) hours as needed for cough.   clobetasol ointment 0.05 % Commonly known as: TEMOVATE Apply to affected area every night for 4 weeks, then every other day for 4 weeks and then twice a week for 4 weeks or until resolution.   clonazePAM 1 MG tablet Commonly known as: KLONOPIN Take 2 mg by mouth at bedtime.   clotrimazole-betamethasone cream Commonly known as: LOTRISONE Apply to affected areas twice daily for 7-10 days   Dexilant 30 MG capsule Generic drug: Dexlansoprazole Take 1 capsule by mouth once daily in the morning   ezetimibe-simvastatin 10-40 MG tablet Commonly known as: VYTORIN Take 1 tablet by mouth every evening.   fluticasone 50 MCG/ACT nasal spray Commonly known as: Holiday representative  1 spray into both nostrils daily.   multivitamin tablet Take 1 tablet by mouth daily.   OSTEO BI-FLEX REGULAR STRENGTH PO Take 1 tablet by mouth daily.   Synthroid 50 MCG tablet Generic drug: levothyroxine Take 50 mcg by mouth daily.   Vitamin D 50 MCG (2000 UT) tablet Take 2,000 Units by mouth daily.   Xiidra 5 % Soln Generic drug: Lifitegrast Place 1 drop into both eyes 2 (two) times daily.       Past Medical History:  Diagnosis Date  . Arthritis   . Depression   . GERD (gastroesophageal  reflux disease)   . Glaucoma, both eyes   . Head injury, closed, without LOC 02/2013   did not LOC per patient  . Headache   . History of syncope 2014   near syncope,  loop recorder placed and explanted 2017  . HLD (hyperlipidemia)   . Hypertension    "at times"    . 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 Hypothyroidism    followed by pcp  . LBBB (left bundle branch block)   . Mild persistent allergic asthma   . Palpitations   . Panic attacks   . PONV (postoperative nausea and vomiting)   . Pre-diabetes   . Wears glasses     Past Surgical History:  Procedure Laterality Date  . CARDIAC SURGERY  02/05/2007   in [place]  for HOCM  . CATARACT EXTRACTION W/ INTRAOCULAR LENS  IMPLANT, BILATERAL  right 07-23-2017;  left 10-15-2017  . COLONOSCOPY N/A 11/24/2013   Procedure: COLONOSCOPY;  Surgeon: Juanita Craver, MD;  Location: [facility];  Service: Endoscopy;  Laterality: N/A;  . DOPPLER ECHOCARDIOGRAPHY  03/14/2013   EF 65-70%,trivial AI,mild-mod. MR,Mod. TR,mod. concentric hypertrophy  . EP IMPLANTABLE DEVICE N/A 08/22/2016   Procedure: Loop Recorder Removal;  Surgeon: Sanda Klein, MD;  Location: [facility];  Service: Cardiovascular;  Laterality: N/A;  . ESOPHAGOGASTRODUODENOSCOPY N/A 11/24/2013   Procedure: ESOPHAGOGASTRODUODENOSCOPY (EGD);  Surgeon: Juanita Craver, MD;  Location: [facility];  Service: Endoscopy;  Laterality: N/A;  . GLAUCOMA SURGERY Bilateral 2019   "release pressure  . KNEE ARTHROSCOPY Right 10/17/2013   Procedure: ARTHROSCOPY RIGHT KNEE;  Surgeon: [name], MD;  Location: [facility];  Service: Orthopedics;  Laterality: Right;  . LAPAROSCOPIC CHOLECYSTECTOMY  02-03-2010   dr d blackman  @[facility]   . LOOP RECORDER IMPLANT  03/18/2013  . LOOP RECORDER IMPLANT N/A 03/18/2013   Procedure: LOOP RECORDER IMPLANT;  Surgeon: Sanda Klein, MD;  Location: [facility];  Service: Cardiovascular;  Laterality: N/A;  . SHOULDER ARTHROSCOPY  WITH ROTATOR CUFF REPAIR Right 01/16/2019   Procedure: Right shoulder arthroscopy with subacromial decompression, distal clavicle resection, rotator cuff repair, bicep tenotomy;  Surgeon: Justice Britain, MD;  Location: [facility];  Service: Orthopedics;  Laterality: Right;  . TUBAL LIGATION  yrs ago    Review of systems negative except as noted in HPI / PMHx or noted below:  Review of Systems  Constitutional: Negative.   HENT: Negative.   Eyes: Negative.   Respiratory: Negative.   Cardiovascular: Negative.   Gastrointestinal: Negative.   Genitourinary: Negative.   Musculoskeletal: Negative.   Skin: Negative.   Neurological: Negative.   Endo/Heme/Allergies: Negative.   Psychiatric/Behavioral: Negative.      Objective:   Vitals:   09/09/19 1436  BP: 140/90  Pulse: 87  Resp: 17  Temp: 97.7 F (36.5 C)  SpO2: 98%  Physical Exam Constitutional:      Appearance: She is not diaphoretic.  HENT:     Head: Normocephalic. No right periorbital erythema or left periorbital erythema.     Right Ear: Tympanic membrane, ear canal and external ear normal.     Left Ear: Tympanic membrane, ear canal and external ear normal.     Nose: Nose normal. No mucosal edema or rhinorrhea.     Mouth/Throat:     Pharynx: No oropharyngeal exudate.  Eyes:     General: Lids are normal.     Conjunctiva/sclera: Conjunctivae normal.     Pupils: Pupils are equal, round, and reactive to light.  Neck:     Thyroid: No thyromegaly.     Trachea: Trachea normal. No tracheal deviation.  Cardiovascular:     Rate and Rhythm: Normal rate and regular rhythm.     Heart sounds: Normal heart sounds, S1 normal and S2 normal. No murmur.  Pulmonary:     Effort: Pulmonary effort is normal. No respiratory distress.     Breath sounds: No stridor. No wheezing or rales.  Chest:     Chest wall: No tenderness.  Abdominal:     General: There is no distension.     Palpations: Abdomen is soft. There is no mass.      Tenderness: There is no abdominal tenderness. There is no guarding or rebound.  Musculoskeletal:        General: No tenderness.  Lymphadenopathy:     Head:     Right side of head: No tonsillar adenopathy.     Left side of head: No tonsillar adenopathy.     Cervical: No cervical adenopathy.  Skin:    Coloration: Skin is not pale.     Findings: No erythema or rash.     Nails: There is no clubbing.   Neurological:     Mental Status: She is alert.     Diagnostics:    Spirometry was performed and demonstrated an FEV1 of 1.34 at 76 % of predicted.  The patient had an Asthma Control Test with the following results: ACT Total Score: 24.    Assessment and Plan:   1. Asthma, moderate persistent, well-controlled   2. Other allergic rhinitis   3. Gastroesophageal reflux disease, esophagitis presence not specified   4. IHSS (idiopathic hypertrophic subaortic stenosis) ([place])      1.  Flovent 44 two inhalations two time a day with spacer    2.  Continue Flonase one spray each nostril 2 times per day    3.  Continue Dexilant 60 mg in a.m.    4.  Continue nasal saline and systane eye drops if needed.  5.  Continue ventolin HFA 2 puffs every 4-6 hours if needed.  6.  Follow-up with echocardiogram and cardiology evaluation   7.  Return to clinic in 12 weeks or earlier if problem  8. Obtain fall flu vaccine (and COVID vaccine)  We can have Aalivia use a low-dose inhaled steroid along with her nasal steroid and Dexilant to treat what appears to be some degree of respiratory tract inflammation and reflux induced respiratory disease.  She will need to follow-up with her echocardiogram and cardiology evaluation for possible respiratory tract symptom contribution from [place].  Assuming she does well I will see her back in this clinic in 12 weeks or earlier if there is a problem.  Allena Katz, MD Allergy / Immunology [place]

## 2019-09-10 ENCOUNTER — Telehealth: Payer: Self-pay | Admitting: *Deleted

## 2019-09-10 ENCOUNTER — Ambulatory Visit (HOSPITAL_COMMUNITY): Payer: Medicare Other | Attending: Cardiovascular Disease

## 2019-09-10 ENCOUNTER — Encounter: Payer: Self-pay | Admitting: Allergy and Immunology

## 2019-09-10 DIAGNOSIS — R0602 Shortness of breath: Secondary | ICD-10-CM | POA: Diagnosis present

## 2019-09-10 DIAGNOSIS — I422 Other hypertrophic cardiomyopathy: Secondary | ICD-10-CM | POA: Insufficient documentation

## 2019-09-10 NOTE — Telephone Encounter (Signed)
Left a message for the patient to call back.  

## 2019-09-10 NOTE — Telephone Encounter (Signed)
-----   Message from Sanda Klein, MD sent at 09/10/2019  5:02 PM EDT ----- Reviewed the echo. Heart pumping function remains excellent and there is no significant outflow obstruction (the septal myectomy she had done is still working great). The heart muscle is thick and relaxes poorly and there evidence of high pressures (which can lead to fluid build up in the lungs and shortness of breath). I would recommend a very low dose of diuretic: furosemide 20 mg once daily to see if this helps her breathing. Make sure she eats lots of fruit and veggies rich in potassium so that we do not need to also give her a K prescription.

## 2019-09-11 ENCOUNTER — Telehealth: Payer: Self-pay

## 2019-09-11 MED ORDER — FUROSEMIDE 20 MG PO TABS: 20.0000 mg | ORAL_TABLET | Freq: Every day | ORAL | 11 refills | Status: DC

## 2019-09-11 NOTE — Telephone Encounter (Signed)
Pt updated with test results and voiced understanding. New orders placed.

## 2019-09-11 NOTE — Telephone Encounter (Signed)
Opened in error

## 2019-09-11 NOTE — Telephone Encounter (Signed)
New Message    Patient calling back for echo results.

## 2019-09-16 ENCOUNTER — Telehealth: Payer: Self-pay | Admitting: Cardiovascular Disease

## 2019-09-16 NOTE — Telephone Encounter (Signed)
Called patient and advised of message.  Patient will stop medication, she will monitor for any weight gain- we discussed 3 lbs in a day and 5 lbs in a week. Patient verbalized understanding.

## 2019-09-16 NOTE — Telephone Encounter (Signed)
Pt c/o medication issue:  1. Name of Medication: furosemide (LASIX) 20 MG tablet  2. How are you currently taking this medication (dosage and times per day)?  Take 1 tablet (20 mg total) by mouth daily.  3. Are you having a reaction (difficulty breathing--STAT)? no  4. What is your medication issue? Patient states she started to take this on Friday, and since then she has been feeling drowsy, has to lay down, she is not urinating, patient feels the medication is not working.

## 2019-09-16 NOTE — Telephone Encounter (Signed)
Go ahead and stop the furosemide please

## 2019-09-16 NOTE — Telephone Encounter (Signed)
Called patient, she states that since she started the medication on Friday- she has not been feeling well at all. She is lightheaded, dizzy, and feeling drowsy, she has to lay down for a few hours. She is not urinating anymore than normal- and she feels that this is causing her issues.  No changes in any swelling, she would like to know if there is any changes in medication.

## 2019-09-22 ENCOUNTER — Other Ambulatory Visit: Payer: Self-pay | Admitting: Allergy and Immunology

## 2019-10-17 ENCOUNTER — Other Ambulatory Visit: Payer: Self-pay | Admitting: Allergy and Immunology

## 2019-12-03 ENCOUNTER — Other Ambulatory Visit: Payer: Self-pay

## 2019-12-03 ENCOUNTER — Encounter: Payer: Self-pay | Admitting: Cardiovascular Disease

## 2019-12-03 ENCOUNTER — Ambulatory Visit (INDEPENDENT_AMBULATORY_CARE_PROVIDER_SITE_OTHER): Payer: Medicare Other | Admitting: Cardiovascular Disease

## 2019-12-03 VITALS — BP 146/82 | HR 98 | Temp 98.6°F | Ht 60.0 in | Wt 144.0 lb

## 2019-12-03 DIAGNOSIS — I447 Left bundle-branch block, unspecified: Secondary | ICD-10-CM | POA: Diagnosis not present

## 2019-12-03 DIAGNOSIS — I422 Other hypertrophic cardiomyopathy: Secondary | ICD-10-CM | POA: Diagnosis not present

## 2019-12-03 DIAGNOSIS — Z9889 Other specified postprocedural states: Secondary | ICD-10-CM | POA: Diagnosis not present

## 2019-12-03 MED ORDER — ATENOLOL 25 MG PO TABS: 12.5000 mg | ORAL_TABLET | Freq: Every day | ORAL | 3 refills | Status: DC

## 2019-12-03 NOTE — Patient Instructions (Signed)
Medication Instructions:  TAKE: Atenolol 12.5 mg once daily  *If you need a refill on your cardiac medications before your next appointment, please call your pharmacy*  Lab Work: None ordered If you have labs (blood work) drawn today and your tests are completely normal, you will receive your results only by: Marland Kitchen MyChart Message (if you have MyChart) OR . A paper copy in the mail If you have any lab test that is abnormal or we need to change your treatment, we will call you to review the results.  Testing/Procedures: None ordered  Follow-Up: At [facility], you and your health needs are our priority.  As part of our continuing mission to provide you with exceptional heart care, we have created designated Provider Care Teams.  These Care Teams include your primary Cardiologist (physician) and Advanced Practice Providers (APPs -  Physician Assistants and Nurse Practitioners) who all work together to provide you with the care you need, when you need it.  Your next appointment:   3 week(s)  The format for your next appointment:   In Person  Provider:   You may see Sanda Klein, MD or one of the following Advanced Practice Providers on your designated Care Team:    Almyra Deforest, PA-C  Fabian Sharp, PA-C or   Roby Lofts, [place]

## 2019-12-08 ENCOUNTER — Encounter: Payer: Self-pay | Admitting: Cardiovascular Disease

## 2019-12-08 NOTE — Progress Notes (Signed)
Cardiology Office Note    Date:  12/08/2019   ID:  Autumn Fitzgerald, DOB Dec 15, 1945, MRN [id number]  PCP:  Nolene Ebbs, MD  Cardiologist:   Sanda Klein, MD   Chief Complaint  Patient presents with  . Shortness of Breath    History of Present Illness:  Autumn Fitzgerald is a 73 y.o. 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 After an episode of near syncope many years ago, a loop recorder was implanted but has not shown any evidence of any meaningful arrhythmia (either on automatic recordings or patient activated symptom driven recordings). The device has reached replacement indicator and it was removed. She has a long-standing left bundle branch block.   She feels poorly and is anxious.  This is a chronic situation.  Today she tells me that "she could die" because of her heart condition, although she has been aware of the diagnosis of hypertrophic cardiomyopathy for many years.  Mostly she states that she "feels sick" and it is difficult to tease out specific complaints.  She does appear to have clear-cut symptoms of exertional dyspnea.  She finds it hard to do even light household activity.  She does not have edema.  She has not experienced syncope.  She does not have orthopnea or PND.  She does not feel palpitations or chest pain.  Treatment with diuretics as often made her feel extremely weak and near syncope.  She is now on a tiny dose of torsemide 5 mg once daily.  She is no longer taking furosemide.  She is also on a low-dose of atenolol.  Higher doses often also made her feel weak.  She is also extremely anxious about the findings on her echocardiogram from September 2020, although no previous studies.  She has excellent progress following her myectomy there is no outflow obstruction.  There is a minor mid cavitary gradient.  However she still has LVH and diastolic dysfunction and  there was evidence of increased filling pressures (congestive heart failure).  When she was called on the results she was really worried when she heard that she has "fluid buildup in her chest".  It seems that she imagine this was a big pocket of water sounds were inside her chest.  It seems that she was also evaluated at [facility] and is upset that I have not received the results from that office.  I checked again today but have never received any communication.  I reassured her that the findings on her echocardiogram are not et al. unexpected and in fact are not changed.  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  She does not have excessive hypertrophy, has never had documented ventricular tachycardia and does not have a strong family history of early sudden cardiac death.  She did have 1 syncopal event, but 3 years of loop recorder monitoring showed no evidence of AV block or VT. She had a normal nuclear stress test in January 2020 (atypical chest pain).  She underwent right shoulder surgery in February 2020 and had a breast biopsy.  She did not have any cardiovascular problems during the procedures.    Past Medical History:  Diagnosis Date  . Arthritis   . Depression   . GERD (gastroesophageal reflux disease)   . Glaucoma, both eyes   .  Head injury, closed, without LOC 02/2013   did not LOC per patient  . Headache   . History of syncope 2014   near syncope,  loop recorder placed and explanted 2017  . HLD (hyperlipidemia)   . Hypertension    "at times"    . 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 Hypothyroidism    followed by pcp  . LBBB (left bundle branch block)   . Mild persistent allergic asthma   . Palpitations   . Panic attacks   . PONV (postoperative nausea and vomiting)   . Pre-diabetes   . Wears glasses     Past  Surgical History:  Procedure Laterality Date  . CARDIAC SURGERY  02/05/2007   in [place]  for HOCM  . CATARACT EXTRACTION W/ INTRAOCULAR LENS  IMPLANT, BILATERAL  right 07-23-2017;  left 10-15-2017  . COLONOSCOPY N/A 11/24/2013   Procedure: COLONOSCOPY;  Surgeon: Juanita Craver, MD;  Location: [facility];  Service: Endoscopy;  Laterality: N/A;  . DOPPLER ECHOCARDIOGRAPHY  03/14/2013   EF 65-70%,trivial AI,mild-mod. MR,Mod. TR,mod. concentric hypertrophy  . EP IMPLANTABLE DEVICE N/A 08/22/2016   Procedure: Loop Recorder Removal;  Surgeon: Sanda Klein, MD;  Location: [facility];  Service: Cardiovascular;  Laterality: N/A;  . ESOPHAGOGASTRODUODENOSCOPY N/A 11/24/2013   Procedure: ESOPHAGOGASTRODUODENOSCOPY (EGD);  Surgeon: Juanita Craver, MD;  Location: [facility];  Service: Endoscopy;  Laterality: N/A;  . GLAUCOMA SURGERY Bilateral 2019   "release pressure  . KNEE ARTHROSCOPY Right 10/17/2013   Procedure: ARTHROSCOPY RIGHT KNEE;  Surgeon: [name], MD;  Location: [facility];  Service: Orthopedics;  Laterality: Right;  . LAPAROSCOPIC CHOLECYSTECTOMY  02-03-2010   dr d blackman  @[facility]   . LOOP RECORDER IMPLANT  03/18/2013  . LOOP RECORDER IMPLANT N/A 03/18/2013   Procedure: LOOP RECORDER IMPLANT;  Surgeon: Sanda Klein, MD;  Location: [facility];  Service: Cardiovascular;  Laterality: N/A;  . SHOULDER ARTHROSCOPY WITH ROTATOR CUFF REPAIR Right 01/16/2019   Procedure: Right shoulder arthroscopy with subacromial decompression, distal clavicle resection, rotator cuff repair, bicep tenotomy;  Surgeon: Justice Britain, MD;  Location: [facility];  Service: Orthopedics;  Laterality: Right;  . TUBAL LIGATION  yrs ago    Current Medications: Outpatient Medications Prior to Visit  Medication Sig Dispense Refill  . acetaminophen (TYLENOL) 500 MG tablet Take 500 mg by mouth every 6 (six) hours as needed for moderate pain or headache.     . albuterol (VENTOLIN HFA) 108 (90 Base) MCG/ACT  inhaler INHALE TWO PUFFS INTO THE LUNGS EVERY 4 TO 6 HOURS AS NEEDED 18 g 1  . benzonatate (TESSALON) 100 MG capsule Take 1 capsule (100 mg total) by mouth every 8 (eight) hours as needed for cough. 30 capsule 5  . Cholecalciferol (VITAMIN D) 50 MCG (2000 UT) tablet Take 2,000 Units by mouth daily.    . clobetasol ointment (TEMOVATE) 0.05 % Apply to affected area every night for 4 weeks, then every other day for 4 weeks and then twice a week for 4 weeks or until resolution. 30 g 5  . clonazePAM (KLONOPIN) 1 MG tablet Take 2 mg by mouth at bedtime.     . clotrimazole-betamethasone (LOTRISONE) cream Apply to affected areas twice daily for 7-10 days 90 g 1  . Dexlansoprazole (DEXILANT) 30 MG capsule Take 1 capsule by mouth once daily in the morning 30 capsule 5  . ezetimibe-simvastatin (VYTORIN) 10-40 MG per tablet Take 1 tablet by mouth every evening.     Marland Kitchen  fluticasone (FLONASE) 50 MCG/ACT nasal spray Place 1 spray into both nostrils daily. 1 g 5  . fluticasone (FLOVENT HFA) 44 MCG/ACT inhaler Inhale 2 puffs into the lungs 2 (two) times daily. With spacer 1 Inhaler 5  . Glucosamine-Chondroitin (OSTEO BI-FLEX REGULAR STRENGTH PO) Take 1 tablet by mouth daily.    Marland Kitchen levothyroxine (SYNTHROID) 50 MCG tablet Take 50 mcg by mouth daily.     . Multiple Vitamin (MULTIVITAMIN) tablet Take 1 tablet by mouth daily.    Marland Kitchen torsemide (DEMADEX) 5 MG tablet Take 5 mg by mouth daily.    Marland Kitchen XIIDRA 5 % SOLN Place 1 drop into both eyes 2 (two) times daily.     Marland Kitchen atenolol (TENORMIN) 25 MG tablet Take 1 tablet (25 mg total) by mouth daily as needed (for high blood pressure). 45 tablet 3  . furosemide (LASIX) 20 MG tablet Take 1 tablet (20 mg total) by mouth daily. 30 tablet 11   No facility-administered medications prior to visit.     Allergies:   Amoxicillin-pot clavulanate, Temazepam, and Lipitor [atorvastatin calcium]   Social History   Socioeconomic History  . Marital status: Divorced    Spouse name: Not on file   . Number of children: 5  . Years of education: 10  . Highest education level: Not on file  Occupational History  . Not on file  Tobacco Use  . Smoking status: Never Smoker  . Smokeless tobacco: Never Used  Substance and Sexual Activity  . Alcohol use: No  . Drug use: No  . Sexual activity: Never    Birth control/protection: Post-menopausal, Surgical    Comment: divorced  Other Topics Concern  . Not on file  Social History Narrative   Lives at home by herself.    Caffeine use: none    Social Determinants of Health   Financial Resource Strain:   . Difficulty of Paying Living Expenses: Not on file  Food Insecurity:   . Worried About Charity fundraiser in the Last Year: Not on file  . Ran Out of Food in the Last Year: Not on file  Transportation Needs:   . Lack of Transportation (Medical): Not on file  . Lack of Transportation (Non-Medical): Not on file  Physical Activity:   . Days of Exercise per Week: Not on file  . Minutes of Exercise per Session: Not on file  Stress:   . Feeling of Stress : Not on file  Social Connections:   . Frequency of Communication with Friends and Family: Not on file  . Frequency of Social Gatherings with Friends and Family: Not on file  . Attends Religious Services: Not on file  . Active Member of Clubs or Organizations: Not on file  . Attends Archivist Meetings: Not on file  . Marital Status: Not on file     Family History:  The patient's family history includes Breast cancer in her mother; Cancer in her brother, father, and mother; Diabetes in her sister; Heart attack in her sister; Hypertension in her mother.   ROS:   Please see the history of present illness.    ROS All other systems reviewed and are negative.   PHYSICAL EXAM:   VS:  BP (!) 146/82   Pulse 98   Temp 98.6 F (37 C)   Ht 5' (1.524 m)   Wt 144 lb (65.3 kg)   SpO2 98%   BMI 28.12 kg/m     General: Alert, oriented x3, no distress,  Head: no evidence of  trauma, PERRL, EOMI, no exophtalmos or lid lag, no myxedema, no xanthelasma; normal ears, nose and oropharynx Neck: normal jugular venous pulsations and no hepatojugular reflux; brisk carotid pulses without delay and no carotid bruits Chest: clear to auscultation, no signs of consolidation by percussion or palpation, normal fremitus, symmetrical and full respiratory excursions Cardiovascular: normal position and quality of the apical impulse, regular rhythm, normal first and paradoxically split second heart sounds, no murmurs, rubs or gallops Abdomen: no tenderness or distention, no masses by palpation, no abnormal pulsatility or arterial bruits, normal bowel sounds, no hepatosplenomegaly Extremities: no clubbing, cyanosis or edema; 2+ radial, ulnar and brachial pulses bilaterally; 2+ right femoral, posterior tibial and dorsalis pedis pulses; 2+ left femoral, posterior tibial and dorsalis pedis pulses; no subclavian or femoral bruits Neurological: grossly nonfocal Psych: Normal mood and affect   Wt Readings from Last 3 Encounters:  12/03/19 144 lb (65.3 kg)  09/03/19 148 lb 12.8 oz (67.5 kg)  07/24/19 146 lb 14.4 oz (66.6 kg)      Studies/Labs Reviewed:   ECHO 09/10/2019  1. Left ventricular ejection fraction, by visual estimation, is 60 to 65%. The left ventricle has normal function. Normal left ventricular size. Left ventricular septal wall thickness was moderately increased. Moderately increased left ventricular  posterior wall thickness. There is moderately increased left ventricular hypertrophy.  2. Elevated left ventricular end-diastolic pressure.  3. Hypertrophic cardiomyopathy.  4. Left ventricular diastolic Doppler parameters are consistent with impaired relaxation pattern of LV diastolic filling.  5. Mid-cavitary gradient noted with a peak velocity of 1.5 m/s, peak gradient 9 mmHg at rest. Gradient increases to 20 mmHg with Valsalva (2.17m/s). There is no significant systolic  anterior motion of the mitral valve.  6. Global right ventricle has normal systolic function.The right ventricular size is normal. No increase in right ventricular wall thickness.  7. Left atrial size was normal.  8. Right atrial size was normal.  9. The mitral valve is normal in structure. Trace mitral valve regurgitation. No evidence of mitral stenosis. 10. The tricuspid valve is normal in structure. Tricuspid valve regurgitation is mild. 11. The aortic valve is tricuspid Aortic valve regurgitation was not visualized by color flow Doppler. Structurally normal aortic valve, with no evidence of sclerosis or stenosis. 12. The pulmonic valve was normal in structure. Pulmonic valve regurgitation is trivial by color flow Doppler. 13. Normal pulmonary artery systolic pressure. 14. The inferior vena cava is normal in size with greater than 50% respiratory variability, suggesting right atrial pressure of 3 mmHg.  EKG:  EKG is ordered today.  The EKG shows normal sinus rhythm with old left bundle branch block, unchanged QTC 469 ms Recent Labs: 01/15/2019: BUN 16; Creatinine, Ser 0.74; Hemoglobin 13.5; Platelets 262; Potassium 3.5; Sodium 140    ASSESSMENT:    1. Hypertrophic cardiomyopathy ([place])   2. History of ventricular septal myectomy   3. LBBB (left bundle branch block)      PLAN:  In order of problems listed above:  1. HCM: The echo shows that she has elevated filling pressures, consistent with diastolic heart failure related to hypertrophic cardiomyopathy.  Furosemide 20 mg daily was "too much".  She is doing okay on the 5 mg of torsemide daily.  Reviewed the fact that beta-blockers are beneficial to deal with the other consequences of hypertrophic cardiomyopathy, including potential ventricular arrhythmia.  We will try a very low-dose of atenolol 12.5 mg daily. 2. LBBB: Does not have symptoms of AV  block, and need to avoid high doses of beta-blocker (she does not tolerate these  anyway)..     Medication Adjustments/Labs and Tests Ordered: Current medicines are reviewed at length with the patient today.  Concerns regarding medicines are outlined above.  Medication changes, Labs and Tests ordered today are listed in the Patient Instructions below. Patient Instructions  Medication Instructions:  TAKE: Atenolol 12.5 mg once daily  *If you need a refill on your cardiac medications before your next appointment, please call your pharmacy*  Lab Work: None ordered If you have labs (blood work) drawn today and your tests are completely normal, you will receive your results only by: Marland Kitchen MyChart Message (if you have MyChart) OR . A paper copy in the mail If you have any lab test that is abnormal or we need to change your treatment, we will call you to review the results.  Testing/Procedures: None ordered  Follow-Up: At [facility], you and your health needs are our priority.  As part of our continuing mission to provide you with exceptional heart care, we have created designated Provider Care Teams.  These Care Teams include your primary Cardiologist (physician) and Advanced Practice Providers (APPs -  Physician Assistants and Nurse Practitioners) who all work together to provide you with the care you need, when you need it.  Your next appointment:   3 week(s)  The format for your next appointment:   In Person  Provider:   You may see Sanda Klein, MD or one of the following Advanced Practice Providers on your designated Care Team:    Almyra Deforest, PA-C  Fabian Sharp, [place] or   Roby Lofts, PA-C       Signed, Sanda Klein, MD  12/08/2019 6:08 PM    [facility] [place], [place], [place]  [address] Phone: [phone]; Fax: [phone]

## 2019-12-25 ENCOUNTER — Encounter: Payer: Self-pay | Admitting: Physician Assistant

## 2019-12-25 ENCOUNTER — Ambulatory Visit (INDEPENDENT_AMBULATORY_CARE_PROVIDER_SITE_OTHER): Payer: Medicare (Managed Care) | Admitting: Physician Assistant

## 2019-12-25 ENCOUNTER — Other Ambulatory Visit: Payer: Self-pay

## 2019-12-25 VITALS — BP 126/74 | HR 90 | Temp 98.4°F | Ht 60.0 in | Wt 142.2 lb

## 2019-12-25 DIAGNOSIS — I10 Essential (primary) hypertension: Secondary | ICD-10-CM

## 2019-12-25 DIAGNOSIS — E039 Hypothyroidism, unspecified: Secondary | ICD-10-CM | POA: Diagnosis not present

## 2019-12-25 DIAGNOSIS — I421 Obstructive hypertrophic cardiomyopathy: Secondary | ICD-10-CM | POA: Diagnosis not present

## 2019-12-25 DIAGNOSIS — E785 Hyperlipidemia, unspecified: Secondary | ICD-10-CM | POA: Diagnosis not present

## 2019-12-25 DIAGNOSIS — R7303 Prediabetes: Secondary | ICD-10-CM

## 2019-12-25 NOTE — Patient Instructions (Signed)
Medication Instructions:  The current medical regimen is effective;  continue present plan and medications as directed. Please refer to the Current Medication list given to you today. If you need a refill on your cardiac medications before your next appointment, please call your pharmacy.  Follow-Up: IN 3- 4 months In Person You may see Pixie Casino, MD or one of the following Advanced Practice Providers on your designated Care Team:  Almyra Deforest, PA-C Fabian Sharp, PA-C or [place], [place].    Reduce your risk of getting COVID-19 With your heart disease it is especially important for people at increased risk of severe illness from COVID-19, and those who live with them, to protect themselves from getting COVID-19. The best way to protect yourself and to help reduce the spread of the virus that causes COVID-19 is to: Marland Kitchen Limit your interactions with other people as much as possible. . Take precautions to prevent getting COVID-19 when you do interact with others. If you start feeling sick and think you may have COVID-19, get in touch with your healthcare provider within 24  At [facility], you and your health needs are our priority.  As part of our continuing mission to provide you with exceptional heart care, we have created designated Provider Care Teams.  These Care Teams include your primary Cardiologist (physician) and Advanced Practice Providers (APPs -  Physician Assistants and Nurse Practitioners) who all work together to provide you with the care you need, when you need it.  Thank you for choosing [facility] at [facility]!!

## 2019-12-25 NOTE — Progress Notes (Signed)
Cardiology Office Note:    Date:  12/27/2019   ID:  Autumn Fitzgerald, DOB February 11, 1946, MRN [id number]  PCP:  Nolene Ebbs, MD  Cardiologist:  Pixie Casino, MD  Electrophysiologist:  None   Referring MD: Nolene Ebbs, MD   Chief Complaint  Patient presents with  . Follow-up    seen for Dr. Debara Pickett.    History of Present Illness:    Autumn Fitzgerald is a 74 y.o. female with a hx of HOCM s/p septal myomectomy, GERD, HTN, HLD, history of near syncope s/p loop recorder that was explanted in 2017, hypothyroidism, LBBB and prediabetes.  Previous loop recorder did not show any meaningful arrhythmia.  She has chronic dyspnea on exertion even while doing household activity.  Treatment with diuretic after leave her feel extremely weak and have near syncope.  She is able to tolerate low-dose torsemide.  Patient had a Myoview in January 2020 as part of the cardiac clearance prior to her surgery.  Myoview showed EF greater than 65%, overall normal study without prior infarct or ischemia.  Echocardiogram obtained on 09/10/2019 showed EF 60 to 65%, moderately increased left ventricular posterior wall thickness and septal wall thickness, elevated LVEDP.  Patient was last seen by Dr. Sallyanne Kuster on 12/03/2019, at which time she was doing well.  Low-dose atenolol 12.5 mg daily was added to her medical regiment given her history of hypertrophic cardiomyopathy.  Patient presents today for cardiology office visit.  She continues to be quite anxious and thinks she could have died from heart heart issue however recent echocardiogram was quite stable.  I reassured her.  She says she continued to feel bad.  She could not tolerate the full dose of atenolol and can only take a half a dose.  She had lab work at her PCPs office last week, we will request the lab work to make sure she is not being dehydrated on the current dose of torsemide.  Otherwise, she denies any obvious chest pain.  She does have occasional back  pain.    Past Medical History:  Diagnosis Date  . Arthritis   . Depression   . GERD (gastroesophageal reflux disease)   . Glaucoma, both eyes   . Head injury, closed, without LOC 02/2013   did not LOC per patient  . Headache   . History of syncope 2014   near syncope,  loop recorder placed and explanted 2017  . HLD (hyperlipidemia)   . Hypertension    "at times"    . 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 Hypothyroidism    followed by pcp  . LBBB (left bundle branch block)   . Mild persistent allergic asthma   . Palpitations   . Panic attacks   . PONV (postoperative nausea and vomiting)   . Pre-diabetes   . Wears glasses     Past Surgical History:  Procedure Laterality Date  . CARDIAC SURGERY  02/05/2007   in [facility]  for HOCM  . CATARACT EXTRACTION W/ INTRAOCULAR LENS  IMPLANT, BILATERAL  right 07-23-2017;  left 10-15-2017  . COLONOSCOPY N/A 11/24/2013   Procedure: COLONOSCOPY;  Surgeon: Juanita Craver, MD;  [facility]: [facility];  Service: Endoscopy;  Laterality: N/A;  . DOPPLER ECHOCARDIOGRAPHY  03/14/2013   EF 65-70%,trivial AI,mild-mod. MR,Mod. TR,mod. concentric hypertrophy  . EP IMPLANTABLE DEVICE N/A 08/22/2016   Procedure: Loop Recorder Removal;  Surgeon: Sanda Klein, MD;  Location: [facility];  Service: Cardiovascular;  Laterality: N/A;  .  ESOPHAGOGASTRODUODENOSCOPY N/A 11/24/2013   Procedure: ESOPHAGOGASTRODUODENOSCOPY (EGD);  Surgeon: Juanita Craver, MD;  [facility];  Service: Endoscopy;  Laterality: N/A;  . GLAUCOMA SURGERY Bilateral 2019   "release pressure  . KNEE ARTHROSCOPY Right 10/17/2013   Procedure: ARTHROSCOPY RIGHT KNEE;  Surgeon: [name], MD;  Location: [facility];  Service: Orthopedics;  Laterality: Right;  . LAPAROSCOPIC CHOLECYSTECTOMY  02-03-2010   dr d blackman  @[facility]   . LOOP RECORDER IMPLANT  03/18/2013  . LOOP RECORDER IMPLANT N/A 03/18/2013   Procedure: LOOP RECORDER IMPLANT;  Surgeon: Sanda Klein, MD;  Location: [facility];  Service: Cardiovascular;  Laterality: N/A;  . SHOULDER ARTHROSCOPY WITH ROTATOR CUFF REPAIR Right 01/16/2019   Procedure: Right shoulder arthroscopy with subacromial decompression, distal clavicle resection, rotator cuff repair, bicep tenotomy;  Surgeon: Justice Britain, MD;  [facility]: [facility];  Service: Orthopedics;  Laterality: Right;  . TUBAL LIGATION  yrs ago    Current Medications: Current Meds  Medication Sig  . acetaminophen (TYLENOL) 500 MG tablet Take 500 mg by mouth every 6 (six) hours as needed for moderate pain or headache.   . albuterol (VENTOLIN HFA) 108 (90 Base) MCG/ACT inhaler INHALE TWO PUFFS INTO THE LUNGS EVERY 4 TO 6 HOURS AS NEEDED  . atenolol (TENORMIN) 25 MG tablet Take 0.5 tablets (12.5 mg total) by mouth daily.  . benzonatate (TESSALON) 100 MG capsule Take 1 capsule (100 mg total) by mouth every 8 (eight) hours as needed for cough.  . Cholecalciferol (VITAMIN D) 50 MCG (2000 UT) tablet Take 2,000 Units by mouth daily.  . clobetasol ointment (TEMOVATE) 0.05 % Apply to affected area every night for 4 weeks, then every other day for 4 weeks and then twice a week for 4 weeks or until resolution.  . clonazePAM (KLONOPIN) 1 MG tablet Take 2 mg by mouth at bedtime.   . clotrimazole-betamethasone (LOTRISONE) cream Apply to affected areas twice daily for 7-10 days  . Dexlansoprazole (DEXILANT) 30 MG capsule Take 1 capsule by mouth once daily in the morning  . ezetimibe-simvastatin (VYTORIN) 10-40 MG per tablet Take 1 tablet by mouth every evening.   . fluticasone (FLONASE) 50 MCG/ACT nasal spray Place 1 spray into both nostrils daily.  . fluticasone (FLOVENT HFA) 44 MCG/ACT inhaler Inhale 2 puffs into the lungs 2 (two) times daily. With spacer  . Glucosamine-Chondroitin (OSTEO BI-FLEX REGULAR STRENGTH PO) Take 1 tablet by mouth daily.  Marland Kitchen levothyroxine (SYNTHROID) 50 MCG tablet Take 50 mcg by mouth daily.   . Multiple Vitamin (MULTIVITAMIN)  tablet Take 1 tablet by mouth daily.  Marland Kitchen torsemide (DEMADEX) 5 MG tablet Take 5 mg by mouth daily.  Marland Kitchen XIIDRA 5 % SOLN Place 1 drop into both eyes 2 (two) times daily.      Allergies:   Amoxicillin-pot clavulanate, Temazepam, and Lipitor [atorvastatin calcium]   Social History   Socioeconomic History  . Marital status: Divorced    Spouse name: Not on file  . Number of children: 5  . Years of education: 10  . Highest education level: Not on file  Occupational History  . Not on file  Tobacco Use  . Smoking status: Never Smoker  . Smokeless tobacco: Never Used  Substance and Sexual Activity  . Alcohol use: No  . Drug use: No  . Sexual activity: Never    Birth control/protection: Post-menopausal, Surgical    Comment: divorced  Other Topics Concern  . Not on file  Social History Narrative   Lives  at home by herself.    Caffeine use: none    Social Determinants of Health   Financial Resource Strain:   . Difficulty of Paying Living Expenses: Not on file  Food Insecurity:   . Worried About Charity fundraiser in the Last Year: Not on file  . Ran Out of Food in the Last Year: Not on file  Transportation Needs:   . Lack of Transportation (Medical): Not on file  . Lack of Transportation (Non-Medical): Not on file  Physical Activity:   . Days of Exercise per Week: Not on file  . Minutes of Exercise per Session: Not on file  Stress:   . Feeling of Stress : Not on file  Social Connections:   . Frequency of Communication with Friends and Family: Not on file  . Frequency of Social Gatherings with Friends and Family: Not on file  . Attends Religious Services: Not on file  . Active Member of Clubs or Organizations: Not on file  . Attends Archivist Meetings: Not on file  . Marital Status: Not on file     Family History: The patient's family history includes Breast cancer in her mother; Cancer in her brother, father, and mother; Diabetes in her sister; Heart attack in  her sister; Hypertension in her mother. There is no history of Migraines.  ROS:   Please see the history of present illness.     All other systems reviewed and are negative.  EKGs/Labs/Other Studies Reviewed:    The following studies were reviewed today:  Myoview 12/24/2018  The left ventricular ejection fraction is hyperdynamic (>65%).  Nuclear stress EF: 80%.  There was no ST segment deviation noted during stress.  The study is normal.  This is a low risk study.   Normal pharmacologic nuclear stress test with no evidence for prior infarct or ischemia. Normal LVEF.   Echo 09/10/2019 IMPRESSIONS    1. Left ventricular ejection fraction, by visual estimation, is 60 to 65%. The left ventricle has normal function. Normal left ventricular size. Left ventricular septal wall thickness was moderately increased. Moderately increased left ventricular  posterior wall thickness. There is moderately increased left ventricular hypertrophy.  2. Elevated left ventricular end-diastolic pressure.  3. Hypertrophic cardiomyopathy.  4. Left ventricular diastolic Doppler parameters are consistent with impaired relaxation pattern of LV diastolic filling.  5. Mid-cavitary gradient noted with a peak velocity of 1.5 m/s, peak gradient 9 mmHg at rest. Gradient increases to 20 mmHg with Valsalva (2.2m/s). There is no significant systolic anterior motion of the mitral valve.  6. Global right ventricle has normal systolic function.The right ventricular size is normal. No increase in right ventricular wall thickness.  7. Left atrial size was normal.  8. Right atrial size was normal.  9. The mitral valve is normal in structure. Trace mitral valve regurgitation. No evidence of mitral stenosis. 10. The tricuspid valve is normal in structure. Tricuspid valve regurgitation is mild. 11. The aortic valve is tricuspid Aortic valve regurgitation was not visualized by color flow Doppler. Structurally normal aortic  valve, with no evidence of sclerosis or stenosis. 12. The pulmonic valve was normal in structure. Pulmonic valve regurgitation is trivial by color flow Doppler. 13. Normal pulmonary artery systolic pressure. 14. The inferior vena cava is normal in size with greater than 50% respiratory variability, suggesting right atrial pressure of 3 mmHg.  EKG:  EKG is not ordered today.    Recent Labs: 01/15/2019: BUN 16; Creatinine, Ser 0.74; Hemoglobin 13.5;  Platelets 262; Potassium 3.5; Sodium 140  Recent Lipid Panel    Component Value Date/Time   CHOL 145 09/23/2008 2026   TRIG 94 09/23/2008 2026   HDL 53 09/23/2008 2026   CHOLHDL 2.7 Ratio 09/23/2008 2026   VLDL 19 09/23/2008 2026   LDLCALC 73 09/23/2008 2026    Physical Exam:    VS:  BP 126/74   Pulse 90   Temp 98.4 F (36.9 C)   Ht 5' (1.524 m)   Wt 142 lb 3.2 oz (64.5 kg)   SpO2 97%   BMI 27.77 kg/m     Wt Readings from Last 3 Encounters:  12/25/19 142 lb 3.2 oz (64.5 kg)  12/03/19 144 lb (65.3 kg)  09/03/19 148 lb 12.8 oz (67.5 kg)     GEN:  Well nourished, well developed in no acute distress HEENT: Normal NECK: No JVD; No carotid bruits LYMPHATICS: No lymphadenopathy CARDIAC: RRR, no murmurs, rubs, gallops RESPIRATORY:  Clear to auscultation without rales, wheezing or rhonchi  ABDOMEN: Soft, non-tender, non-distended MUSCULOSKELETAL:  No edema; No deformity  SKIN: Warm and dry NEUROLOGIC:  Alert and oriented x 3 PSYCHIATRIC:  Normal affect   ASSESSMENT:    1. HOCM (hypertrophic obstructive cardiomyopathy) ([place])   2. Essential hypertension   3. Hyperlipidemia LDL goal <100   4. Hypothyroidism, unspecified type   5. Prediabetes    PLAN:    In order of problems listed above:  1. HOCM s/p myomectomy:  Recent echocardiogram was stable, there is no sign of LV outlet obstruction.  She is tolerating low-dose 5 mg daily of torsemide.  She continued to feel bad, some of her issue seems to be more related to anxiety.   She is concerned that her heart issues life-threatening, however recent echocardiogram did not suggest acute severe issue.  I wanted to obtain a basic metabolic panel however patient says she recently had blood work at her PCPs office.  We will request the blood work to make sure she is not dehydrated.  2. Hypertension: Blood pressure stable  3. Hyperlipidemia: Continue Vytorin  4. Hypothyroidism: On Synthroid, managed by primary care provider  5. Prediabetes: Managed by primary care provider   Medication Adjustments/Labs and Tests Ordered: Current medicines are reviewed at length with the patient today.  Concerns regarding medicines are outlined above.  No orders of the defined types were placed in this encounter.  No orders of the defined types were placed in this encounter.   Patient Instructions  Medication Instructions:  The current medical regimen is effective;  continue present plan and medications as directed. Please refer to the Current Medication list given to you today. If you need a refill on your cardiac medications before your next appointment, please call your pharmacy.  Follow-Up: IN 3- 4 months In Person You may see Pixie Casino, MD or one of the following Advanced Practice Providers on your designated Care Team:  Almyra Deforest, PA-C Fabian Sharp, PA-C or Roman Forest, [place].    Reduce your risk of getting COVID-19 With your heart disease it is especially important for people at increased risk of severe illness from COVID-19, and those who live with them, to protect themselves from getting COVID-19. The best way to protect yourself and to help reduce the spread of the virus that causes COVID-19 is to: Marland Kitchen Limit your interactions with other people as much as possible. . Take precautions to prevent getting COVID-19 when you do interact with others. If you start feeling sick  and think you may have COVID-19, get in touch with your healthcare provider within 24  At [facility], you and your health needs are our priority.  As part of our continuing mission to provide you with exceptional heart care, we have created designated Provider Care Teams.  These Care Teams include your primary Cardiologist (physician) and Advanced Practice Providers (APPs -  Physician Assistants and Nurse Practitioners) who all work together to provide you with the care you need, when you need it.  Thank you for choosing [facility] at [employer], Almyra Deforest, [place]  12/27/2019 11:49 PM    [place]

## 2019-12-27 ENCOUNTER — Encounter: Payer: Self-pay | Admitting: Physician Assistant

## 2019-12-30 ENCOUNTER — Encounter: Payer: Self-pay | Admitting: Allergy and Immunology

## 2019-12-30 ENCOUNTER — Ambulatory Visit (INDEPENDENT_AMBULATORY_CARE_PROVIDER_SITE_OTHER): Payer: Medicare (Managed Care) | Admitting: Allergy and Immunology

## 2019-12-30 ENCOUNTER — Other Ambulatory Visit: Payer: Self-pay

## 2019-12-30 VITALS — BP 122/74 | HR 86 | Temp 98.1°F | Resp 16

## 2019-12-30 DIAGNOSIS — K219 Gastro-esophageal reflux disease without esophagitis: Secondary | ICD-10-CM

## 2019-12-30 DIAGNOSIS — J4540 Moderate persistent asthma, uncomplicated: Secondary | ICD-10-CM | POA: Diagnosis not present

## 2019-12-30 DIAGNOSIS — J3089 Other allergic rhinitis: Secondary | ICD-10-CM | POA: Diagnosis not present

## 2019-12-30 DIAGNOSIS — I421 Obstructive hypertrophic cardiomyopathy: Secondary | ICD-10-CM

## 2019-12-30 NOTE — Progress Notes (Signed)
[facility] [place] [facility] [place]   Follow-up Note  Referring Provider: Nolene Ebbs, MD Primary Provider: Nolene Ebbs, MD Date of Office Visit: 12/30/2019  Subjective:   Autumn Fitzgerald (DOB: 08/05/1946) is a 74 y.o. female who returns to the [facility] on 12/30/2019 in re-evaluation of the following:  HPI: Tasnia returns to this clinic in evaluation of asthma and allergic rhinitis and LPR and IHSS.  Her last visit to this clinic was 09 September 2019.  Although she had resolved the bulk of her respiratory tract symptoms with therapy prescribed in the fall 2020 she still continues to have some chest discomfort right in the center of her chest and we obtained an echocardiogram and had a visit with cardiology and it did appear as though she had some problems with her IHSS and some congestive heart failure requiring her to start various medications including diuretics and a beta-blocker and she is much better at this point in time and her chest discomfort has resolved.  She does not need to use any type of short acting bronchodilator at this point in time.  She is had very little issues with her nose.  Her reflux is under good control.  She has not required a systemic steroid or antibiotic since I have seen her in this clinic in September 2020.  She did obtain the flu vaccine this year.  Allergies as of 12/30/2019      Reactions   Amoxicillin-pot Clavulanate Other (See Comments)   Severe pains and GI symptoms Did it involve swelling of the face/tongue/throat, SOB, or low BP? No Did it involve sudden or severe rash/hives, skin peeling, or any reaction on the inside of your mouth or nose? No Did you need to seek medical attention at a hospital or doctor's office? No When did it last happen?within the past 10 years If all above answers are "NO", may proceed with cephalosporin use.   Temazepam Other (See Comments)   Dizziness and doesn't  work for patient   Lipitor [atorvastatin Calcium]    Muscle pain      Medication List    acetaminophen 500 MG tablet Commonly known as: TYLENOL Take 500 mg by mouth every 6 (six) hours as needed for moderate pain or headache.   albuterol 108 (90 Base) MCG/ACT inhaler Commonly known as: VENTOLIN HFA INHALE TWO PUFFS INTO THE LUNGS EVERY 4 TO 6 HOURS AS NEEDED   atenolol 25 MG tablet Commonly known as: TENORMIN Take 0.5 tablets (12.5 mg total) by mouth daily.   benzonatate 100 MG capsule Commonly known as: TESSALON Take 1 capsule (100 mg total) by mouth every 8 (eight) hours as needed for cough.   clobetasol ointment 0.05 % Commonly known as: TEMOVATE Apply to affected area every night for 4 weeks, then every other day for 4 weeks and then twice a week for 4 weeks or until resolution.   clonazePAM 1 MG tablet Commonly known as: KLONOPIN Take 2 mg by mouth at bedtime.   clotrimazole-betamethasone cream Commonly known as: LOTRISONE Apply to affected areas twice daily for 7-10 days   Dexilant 30 MG capsule Generic drug: Dexlansoprazole Take 1 capsule by mouth once daily in the morning   ezetimibe-simvastatin 10-40 MG tablet Commonly known as: VYTORIN Take 1 tablet by mouth every evening.   Flovent HFA 44 MCG/ACT inhaler Generic drug: fluticasone Inhale 2 puffs into the lungs 2 (two) times daily. With spacer   fluticasone 50 MCG/ACT  nasal spray Commonly known as: Flonase Place 1 spray into both nostrils daily.   Synthroid 50 MCG tablet Generic drug: levothyroxine Take 50 mcg by mouth daily.   torsemide 5 MG tablet Commonly known as: DEMADEX Take 5 mg by mouth daily.   Xiidra 5 % Soln Generic drug: Lifitegrast Place 1 drop into both eyes 2 (two) times daily.       Past Medical History:  Diagnosis Date  . Arthritis   . Depression   . GERD (gastroesophageal reflux disease)   . Glaucoma, both eyes   . Head injury, closed, without LOC 02/2013   did not LOC  per patient  . Headache   . History of syncope 2014   near syncope,  loop recorder placed and explanted 2017  . HLD (hyperlipidemia)   . Hypertension    "at times"    . 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 Hypothyroidism    followed by pcp  . LBBB (left bundle branch block)   . Mild persistent allergic asthma   . Palpitations   . Panic attacks   . PONV (postoperative nausea and vomiting)   . Pre-diabetes   . Wears glasses     Past Surgical History:  Procedure Laterality Date  . CARDIAC SURGERY  02/05/2007   in [place]  for HOCM  . CATARACT EXTRACTION W/ INTRAOCULAR LENS  IMPLANT, BILATERAL  right 07-23-2017;  left 10-15-2017  . COLONOSCOPY N/A 11/24/2013   Procedure: COLONOSCOPY;  Surgeon: Juanita Craver, MD;  [facility];  Service: Endoscopy;  Laterality: N/A;  . DOPPLER ECHOCARDIOGRAPHY  03/14/2013   EF 65-70%,trivial AI,mild-mod. MR,Mod. TR,mod. concentric hypertrophy  . EP IMPLANTABLE DEVICE N/A 08/22/2016   Procedure: Loop Recorder Removal;  Surgeon: Sanda Klein, MD;  Location: [facility];  Service: Cardiovascular;  Laterality: N/A;  . ESOPHAGOGASTRODUODENOSCOPY N/A 11/24/2013   Procedure: ESOPHAGOGASTRODUODENOSCOPY (EGD);  Surgeon: Juanita Craver, MD;  [facility];  Service: Endoscopy;  Laterality: N/A;  . GLAUCOMA SURGERY Bilateral 2019   "release pressure  . KNEE ARTHROSCOPY Right 10/17/2013   Procedure: ARTHROSCOPY RIGHT KNEE;  Surgeon: [name], MD;  Location: [facility];  Service: Orthopedics;  Laterality: Right;  . LAPAROSCOPIC CHOLECYSTECTOMY  02-03-2010   dr d blackman  @[facility]   . LOOP RECORDER IMPLANT  03/18/2013  . LOOP RECORDER IMPLANT N/A 03/18/2013   Procedure: LOOP RECORDER IMPLANT;  Surgeon: Sanda Klein, MD;  Location: [facility];  Service: Cardiovascular;  Laterality: N/A;  . SHOULDER ARTHROSCOPY WITH ROTATOR CUFF REPAIR Right 01/16/2019   Procedure: Right shoulder arthroscopy with subacromial  decompression, distal clavicle resection, rotator cuff repair, bicep tenotomy;  Surgeon: Justice Britain, MD;  [facility];  Service: Orthopedics;  Laterality: Right;  . TUBAL LIGATION  yrs ago    Review of systems negative except as noted in HPI / PMHx or noted below:  Review of Systems  Constitutional: Negative.   HENT: Negative.   Eyes: Negative.   Respiratory: Negative.   Cardiovascular: Negative.   Gastrointestinal: Negative.   Genitourinary: Negative.   Musculoskeletal: Negative.   Skin: Negative.   Neurological: Negative.   Endo/Heme/Allergies: Negative.   Psychiatric/Behavioral: Negative.      Objective:   Vitals:   12/30/19 1500  BP: 122/74  Pulse: 86  Resp: 16  Temp: 98.1 F (36.7 C)  SpO2: 97%          Physical Exam Constitutional:      Appearance: She is not diaphoretic.  HENT:     Head: Normocephalic.     Right Ear: Tympanic membrane, ear canal and external ear normal.     Left Ear: Tympanic membrane, ear canal and external ear normal.     Nose: Nose normal. No mucosal edema or rhinorrhea.     Mouth/Throat:     Pharynx: Uvula midline. No oropharyngeal exudate.  Eyes:     Conjunctiva/sclera: Conjunctivae normal.  Neck:     Thyroid: No thyromegaly.     Trachea: Trachea normal. No tracheal tenderness or tracheal deviation.  Cardiovascular:     Rate and Rhythm: Normal rate and regular rhythm.     Heart sounds: Normal heart sounds, S1 normal and S2 normal. No murmur.  Pulmonary:     Effort: No respiratory distress.     Breath sounds: Normal breath sounds. No stridor. No wheezing or rales.  Lymphadenopathy:     Head:     Right side of head: No tonsillar adenopathy.     Left side of head: No tonsillar adenopathy.     Cervical: No cervical adenopathy.  Skin:    Findings: No erythema or rash.     Nails: There is no clubbing.  Neurological:     Mental Status: She is alert.     Diagnostics:    Spirometry was performed and demonstrated an  FEV1 of 1.34 at 76 % of predicted.  Assessment and Plan:   1. Asthma, moderate persistent, well-controlled   2. Other allergic rhinitis   3. LPRD (laryngopharyngeal reflux disease)   4. IHSS (idiopathic hypertrophic subaortic stenosis) ([place])      1.  Flovent 44 two inhalations one time a day with spacer to prevent asthma problems  2.  Continue Flonase one spray each nostril 1-2 times per day    3.  Continue Dexilant 60 mg in a.m.    4.  Continue nasal saline and systane eye drops if needed.  5.  Continue ventolin HFA 2 puffs every 4-6 hours if needed.  6.  Return to clinic in 6 months or earlier if problem  7. Obtain COVID vaccine when available  Shayanne appears to be doing well.  She is not very consistent about using her Flovent and I have encouraged her to use this medication in a preventative manner to prevent her from developing any problems with asthma while she continues to use a dose of nasal steroid and therapy directed against reflux as noted above.  Assuming she continues to do well with this plan I will see her back in this clinic in 6 months or earlier if there is a problem.  Allena Katz, MD Allergy / Immunology [place]

## 2019-12-30 NOTE — Patient Instructions (Signed)
  1.  Flovent 44 two inhalations one time a day with spacer to prevent asthma problems  2.  Continue Flonase one spray each nostril 1-2 times per day    3.  Continue Dexilant 60 mg in a.m.    4.  Continue nasal saline and systane eye drops if needed.  5.  Continue ventolin HFA 2 puffs every 4-6 hours if needed.  6.  Return to clinic in 6 months or earlier if problem  7. Obtain COVID vaccine when available

## 2019-12-31 ENCOUNTER — Encounter: Payer: Self-pay | Admitting: Allergy and Immunology

## 2020-01-28 ENCOUNTER — Other Ambulatory Visit: Payer: Self-pay | Admitting: Allergy and Immunology

## 2020-02-15 ENCOUNTER — Other Ambulatory Visit: Payer: Self-pay | Admitting: Allergy and Immunology

## 2020-02-24 ENCOUNTER — Emergency Department (HOSPITAL_COMMUNITY)
Admission: EM | Admit: 2020-02-24 | Discharge: 2020-02-24 | Disposition: A | Discharge status: Home / Self Care | Payer: Medicare (Managed Care) | Attending: Emergency Medicine | Admitting: Emergency Medicine

## 2020-02-24 ENCOUNTER — Emergency Department (HOSPITAL_COMMUNITY): Payer: Medicare (Managed Care)

## 2020-02-24 ENCOUNTER — Encounter (HOSPITAL_COMMUNITY): Payer: Self-pay | Admitting: Emergency Medicine

## 2020-02-24 ENCOUNTER — Other Ambulatory Visit: Payer: Self-pay

## 2020-02-24 DIAGNOSIS — R109 Unspecified abdominal pain: Secondary | ICD-10-CM | POA: Diagnosis not present

## 2020-02-24 DIAGNOSIS — G8929 Other chronic pain: Secondary | ICD-10-CM | POA: Insufficient documentation

## 2020-02-24 DIAGNOSIS — I10 Essential (primary) hypertension: Secondary | ICD-10-CM | POA: Diagnosis not present

## 2020-02-24 DIAGNOSIS — R0602 Shortness of breath: Secondary | ICD-10-CM | POA: Diagnosis not present

## 2020-02-24 DIAGNOSIS — Z79899 Other long term (current) drug therapy: Secondary | ICD-10-CM | POA: Insufficient documentation

## 2020-02-24 DIAGNOSIS — E039 Hypothyroidism, unspecified: Secondary | ICD-10-CM | POA: Insufficient documentation

## 2020-02-24 DIAGNOSIS — R531 Weakness: Secondary | ICD-10-CM | POA: Diagnosis not present

## 2020-02-24 DIAGNOSIS — R0789 Other chest pain: Secondary | ICD-10-CM

## 2020-02-24 LAB — BASIC METABOLIC PANEL
Anion gap: 12 (ref 5–15)
BUN: 9 mg/dL (ref 8–23)
CO2: 27 mmol/L (ref 22–32)
Calcium: 9.1 mg/dL (ref 8.9–10.3)
Chloride: 101 mmol/L (ref 98–111)
Creatinine, Ser: 0.97 mg/dL (ref 0.44–1.00)
GFR calc Af Amer: >60 mL/min (ref >60)
GFR calc non Af Amer: 57 mL/min — ABNORMAL LOW (ref >60)
Glucose, Bld: 161 mg/dL — ABNORMAL HIGH (ref 70–99)
Potassium: 4.1 mmol/L (ref 3.5–5.1)
Sodium: 140 mmol/L (ref 135–145)

## 2020-02-24 LAB — CBC
HCT: 45.7 % (ref 36.0–46.0)
Hemoglobin: 14.5 g/dL (ref 12.0–15.0)
MCH: 28.5 pg (ref 26.0–34.0)
MCHC: 31.7 g/dL (ref 30.0–36.0)
MCV: 90 fL (ref 80.0–100.0)
Platelets: 300 10*3/uL (ref 150–400)
RBC: 5.08 MIL/uL (ref 3.87–5.11)
RDW: 13.4 % (ref 11.5–15.5)
WBC: 6.8 10*3/uL (ref 4.0–10.5)
nRBC: 0 % (ref 0.0–0.2)

## 2020-02-24 LAB — TROPONIN I (HIGH SENSITIVITY)
Troponin I (High Sensitivity): 10 ng/L (ref <18)
Troponin I (High Sensitivity): 11 ng/L (ref <18)

## 2020-02-24 MED ORDER — SODIUM CHLORIDE 0.9% FLUSH: 3.0000 mL | Freq: Once | INTRAVENOUS | Status: DC

## 2020-02-24 NOTE — Discharge Instructions (Addendum)
If your symptoms worsen or you develop any new concerns please seek additional medical care and evaluation. Please call your primary care doctor and your cardiologist, let them know that you were seen in the emergency room and see if they can follow-up with you sooner.

## 2020-02-24 NOTE — ED Provider Notes (Signed)
[facility] EMERGENCY DEPARTMENT Provider Note   CSN: [id number] Arrival date & time: 02/24/20  1142     History Chief Complaint  Patient presents with  . Shortness of Breath  . Weakness    Autumn Fitzgerald is a 74 y.o. female with past medical history of hypertrophic obstructive cardiomyopathy, hypothyroid, panic attacks, GERD, asthma, left bundle branch block who presents today for evaluation of Worsening shortness of breath over the past several months.  She reports that all of her symptoms started approximately 6 months ago, however she felt too afraid to go to the doctor due to coronavirus.  She states that the primary reason she decided to come in today was not due to change in symptoms or worsening symptoms, rather that she got her second coronavirus vaccine.  She denies any new symptoms over the past month or significant changes in her symptoms.  She reports that she has intermittent pain in her chest and abdomen.  This is present every single day.  Reports that she feels weak when she takes her blood pressure medications and her torsemide.  This is also unchanged.  Denies any acute complaints or concerns today.    HPI     Past Medical History:  Diagnosis Date  . Arthritis   . Depression   . GERD (gastroesophageal reflux disease)   . Glaucoma, both eyes   . Head injury, closed, without LOC 02/2013   did not LOC per patient  . Headache   . History of syncope 2014   near syncope,  loop recorder placed and explanted 2017  . HLD (hyperlipidemia)   . Hypertension    "at times"    . 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 Hypothyroidism    followed by pcp  . LBBB (left bundle branch block)   . Mild persistent allergic asthma   . Palpitations   . Panic attacks   . PONV (postoperative nausea and vomiting)   . Pre-diabetes   . Wears glasses     Patient Active Problem List   Diagnosis Date Noted  . Viral URI with cough 10/09/2017  .  Encounter for loop recorder at end of battery life 08/02/2016  . New onset of headaches after age 42 09/30/2015  . Dizziness 09/30/2015  . Vision loss, bilateral 09/30/2015  . Lichen sclerosus et atrophicus 06/18/2014  . Benign gastric polyp 02/20/2014  . Degenerative joint disease involving multiple joints 09/10/2013  . Hypertrophic obstructive cardiomyopathy ([place]) 07/23/2013  . LBBB (left bundle branch block) 07/23/2013  . Subclinical hypothyroidism 05/14/2012  . Lower abdominal pain 01/10/2012  . Panic attacks   . GERD (gastroesophageal reflux disease)   . HLD (hyperlipidemia)   . Mild persistent asthma   . HTN (hypertension)   . Palpitations   . Allergic rhinitis 02/24/2011  . Anxiety, generalized 02/24/2011  . Chronic recurrent major depressive disorder ([place]) 02/24/2011    Past Surgical History:  Procedure Laterality Date  . CARDIAC SURGERY  02/05/2007   in [place]  for HOCM  . CATARACT EXTRACTION W/ INTRAOCULAR LENS  IMPLANT, BILATERAL  right 07-23-2017;  left 10-15-2017  . COLONOSCOPY N/A 11/24/2013   Procedure: COLONOSCOPY;  Surgeon: Juanita Craver, MD;  Location: [facility];  Service: Endoscopy;  Laterality: N/A;  . DOPPLER ECHOCARDIOGRAPHY  03/14/2013   EF 65-70%,trivial AI,mild-mod. MR,Mod. TR,mod. concentric hypertrophy  . EP IMPLANTABLE DEVICE N/A 08/22/2016   Procedure: Loop Recorder Removal;  Surgeon: Sanda Klein, MD;  Location: [facility]  INVASIVE CV LAB;  Service: Cardiovascular;  Laterality: N/A;  . ESOPHAGOGASTRODUODENOSCOPY N/A 11/24/2013   Procedure: ESOPHAGOGASTRODUODENOSCOPY (EGD);  Surgeon: Juanita Craver, MD;  Location: [facility];  Service: Endoscopy;  Laterality: N/A;  . GLAUCOMA SURGERY Bilateral 2019   "release pressure  . KNEE ARTHROSCOPY Right 10/17/2013   Procedure: ARTHROSCOPY RIGHT KNEE;  Surgeon: [name], MD;  Location: [facility];  Service: Orthopedics;  Laterality: Right;  . LAPAROSCOPIC CHOLECYSTECTOMY  02-03-2010   dr d blackman   @[facility]   . LOOP RECORDER IMPLANT  03/18/2013  . LOOP RECORDER IMPLANT N/A 03/18/2013   Procedure: LOOP RECORDER IMPLANT;  Surgeon: Sanda Klein, MD;  Location: [facility] CATH LAB;  Service: Cardiovascular;  Laterality: N/A;  . SHOULDER ARTHROSCOPY WITH ROTATOR CUFF REPAIR Right 01/16/2019   Procedure: Right shoulder arthroscopy with subacromial decompression, distal clavicle resection, rotator cuff repair, bicep tenotomy;  Surgeon: Justice Britain, MD;  Location: [facility];  Service: Orthopedics;  Laterality: Right;  . TUBAL LIGATION  yrs ago     OB History    Gravida  5   Para  5   Term  5   Preterm      AB      Living  4     SAB      TAB      Ectopic      Multiple      Live Births              Family History  Problem Relation Age of Onset  . Cancer Mother        breast  . Hypertension Mother   . Breast cancer Mother   . Cancer Father        prostate  . Cancer Brother   . Diabetes Sister   . Heart attack Sister   . Migraines Neg Hx     Social History   Tobacco Use  . Smoking status: Never Smoker  . Smokeless tobacco: Never Used  Substance Use Topics  . Alcohol use: No  . Drug use: No    Home Medications Prior to Admission medications   Medication Sig Start Date End Date Taking? Authorizing Provider  acetaminophen (TYLENOL) 500 MG tablet Take 500 mg by mouth every 6 (six) hours as needed for moderate pain or headache.     [provider]  albuterol (VENTOLIN HFA) 108 (90 Base) MCG/ACT inhaler INHALE TWO PUFFS BY MOUTH EVERY 4 TO 6 HOURS AS NEEDED 01/29/20   Kozlow, Donnamarie Poag, MD  atenolol (TENORMIN) 25 MG tablet Take 0.5 tablets (12.5 mg total) by mouth daily. 12/03/19   Croitoru, Dani Gobble, MD  benzonatate (TESSALON) 100 MG capsule Take 1 capsule (100 mg total) by mouth every 8 (eight) hours as needed for cough. 07/01/19   Kozlow, Donnamarie Poag, MD  clobetasol ointment (TEMOVATE) 0.05 % Apply to affected area every night for 4 weeks, then every other day for 4 weeks and  then twice a week for 4 weeks or until resolution. 07/24/19   Constant, Peggy, MD  clonazePAM (KLONOPIN) 1 MG tablet Take 2 mg by mouth at bedtime.     [provider]  clotrimazole-betamethasone (LOTRISONE) cream Apply to affected areas twice daily for 7-10 days 07/01/19   Kozlow, Donnamarie Poag, MD  Dexlansoprazole (DEXILANT) 30 MG capsule Take 1 capsule by mouth once daily in the morning 08/12/19   Kozlow, Donnamarie Poag, MD  ezetimibe-simvastatin (VYTORIN) 10-40 MG per tablet Take 1 tablet by mouth every evening.  [provider]  fluticasone (FLONASE) 50 MCG/ACT nasal spray Place 1 spray into both nostrils daily. 07/01/19   Kozlow, Donnamarie Poag, MD  fluticasone (FLOVENT HFA) 44 MCG/ACT inhaler Inhale 2 puffs into the lungs 2 (two) times daily. With spacer 09/09/19   Kozlow, Donnamarie Poag, MD  levothyroxine (SYNTHROID) 50 MCG tablet Take 50 mcg by mouth daily.  06/15/14   [provider]  torsemide (DEMADEX) 5 MG tablet Take 5 mg by mouth daily.    [provider]  XIIDRA 5 % SOLN Place 1 drop into both eyes 2 (two) times daily.  08/07/18   [provider]    Allergies    Amoxicillin-pot clavulanate, Temazepam, and Lipitor [atorvastatin calcium]  Review of Systems   Review of Systems  Constitutional: Negative for chills and fever.  Respiratory: Positive for chest tightness and shortness of breath.   Cardiovascular: Positive for chest pain.  Gastrointestinal: Positive for abdominal pain.  Musculoskeletal: Positive for back pain (Chronic, unchanged). Negative for neck pain.  Neurological: Positive for weakness (generalized). Negative for light-headedness and headaches.  Psychiatric/Behavioral: Negative for confusion. The patient is nervous/anxious.   All other systems reviewed and are negative. Please note all of the positives from the ROS are all unchanged, and have all been approximately the same for the past 6 months  Physical Exam Updated Vital Signs BP 122/86   Pulse 81    Temp 98.2 F (36.8 C) (Oral)   Resp 18   SpO2 99%   Physical Exam Vitals and nursing note reviewed.  Constitutional:      General: She is not in acute distress.    Appearance: She is well-developed. She is not diaphoretic.  HENT:     Head: Normocephalic and atraumatic.  Eyes:     General: No scleral icterus.       Right eye: No discharge.        Left eye: No discharge.     Conjunctiva/sclera: Conjunctivae normal.  Cardiovascular:     Rate and Rhythm: Normal rate and regular rhythm.  Pulmonary:     Effort: Pulmonary effort is normal. No tachypnea, accessory muscle usage or respiratory distress.     Breath sounds: Normal breath sounds. No stridor. No decreased breath sounds.  Chest:     Chest wall: No mass or tenderness.  Abdominal:     General: There is no distension.     Palpations: Abdomen is soft.     Tenderness: There is no abdominal tenderness.  Musculoskeletal:        General: No deformity.     Cervical back: Normal range of motion.     Right lower leg: No tenderness. No edema.     Left lower leg: No tenderness. No edema.  Skin:    General: Skin is warm and dry.  Neurological:     General: No focal deficit present.     Mental Status: She is alert.     Cranial Nerves: No cranial nerve deficit.     Motor: No abnormal muscle tone.  Psychiatric:        Mood and Affect: Mood is anxious.        Speech: Speech is tangential.        Behavior: Behavior is cooperative.     ED Results / Procedures / Treatments   Labs (all labs ordered are listed, but only abnormal results are displayed) Labs Reviewed  BASIC METABOLIC PANEL - Abnormal; Notable for the following components:  Result Value   Glucose, Bld 161 (*)    GFR calc non Af Amer 57 (*)    All other components within normal limits  CBC  TROPONIN I (HIGH SENSITIVITY)  TROPONIN I (HIGH SENSITIVITY)    EKG EKG Interpretation  Date/Time:  Tuesday February 24 2020 11:47:23 EDT Ventricular Rate:  94 PR  Interval:  130 QRS Duration: 122 QT Interval:  386 QTC Calculation: 482 R Axis:   83 Text Interpretation: Normal sinus rhythm Possible Left atrial enlargement Non-specific intra-ventricular conduction delay ST-t wave abnormality No significant change since last tracing Abnormal ECG Confirmed by Carmin Muskrat [phone]) on 02/24/2020 11:57:19 AM Also confirmed by Carmin Muskrat ([id number]), editor Hattie Perch ([id number])  on 02/24/2020 1:38:33 PM Also confirmed by Pattricia Boss [phone])  on 02/24/2020 3:17:10 PM   Radiology DG Chest 2 View  Result Date: 02/24/2020 CLINICAL DATA:  Shortness of breath EXAM: CHEST - 2 VIEW COMPARISON:  September 30, 2018 FINDINGS: Lungs are clear. Heart size and pulmonary vascularity are normal. No adenopathy. Patient is status post internal mammary artery bypass grafting. No pneumothorax. No bone lesions. IMPRESSION: Lungs clear.  Heart size within normal limits.  No adenopathy. Electronically Signed   By: Lowella Grip III M.D.   On: 02/24/2020 13:00    Procedures Procedures (including critical care time)  Medications Ordered in ED Medications - No data to display  ED Course  I have reviewed the triage vital signs and the nursing notes.  Pertinent labs & imaging results that were available during my care of the patient were reviewed by me and considered in my medical decision making (see chart for details).    MDM Rules/Calculators/A&P                     Patient is a 74 year old woman with a past medical history of hypertrophic cardiomyopathy who presents today for evaluation of multiple complaints, all of which are unchanged over the past 6 months.  The reason she decided to come in today was she finally got her second Covid vaccine and felt safe to come in.  She already has follow-up appointment scheduled next month with her cardiologist and her primary care doctor.  She appears anxious and concerned about her health, however not have any acute complaints  today.  Chest x-ray was obtained without evidence of acute abnormality.  Troponin x2 is normal.  EKG without evidence of acute ischemia.  Overall patient is very well-appearing however appears anxious.  Given that she has not had any change in the symptoms over the past 6 months I have a very low suspicion for an acute emergent medical condition.  Do not suspect ACS. Recommended she call her outpatient cardiologist and PCP to see if they can see her sooner to help with her anxiety.  Patient was seen as a shared visit with Dr. Jeanell Sparrow.  Return precautions were discussed with patient who states their understanding.  At the time of discharge patient denied any unaddressed complaints or concerns.  Patient is agreeable for discharge home.  Note: Portions of this report may have been transcribed using voice recognition software. Every effort was made to ensure accuracy; however, inadvertent computerized transcription errors may be present  Final Clinical Impression(s) / ED Diagnoses Final diagnoses:  Generalized weakness  Shortness of breath  Chronic abdominal pain  Atypical chest pain    Rx / DC Orders ED Discharge Orders    None       Lorin Glass,  PA-C 02/24/20 2129    Pattricia Boss, MD 02/26/20 1327

## 2020-02-24 NOTE — ED Triage Notes (Signed)
Patient arrives to ED with complaints of shortness of breath for a couple of months and worsening weakness over the last couple of days. Patient states she is anxious because she has heart failure and her echo in September has scared her. Patient denies chest pain and any acute change in symptoms today.

## 2020-02-25 ENCOUNTER — Other Ambulatory Visit: Payer: Self-pay | Admitting: Allergy and Immunology

## 2020-03-24 ENCOUNTER — Ambulatory Visit (INDEPENDENT_AMBULATORY_CARE_PROVIDER_SITE_OTHER): Payer: Medicare (Managed Care) | Admitting: Cardiovascular Disease

## 2020-03-24 ENCOUNTER — Encounter: Payer: Self-pay | Admitting: Cardiovascular Disease

## 2020-03-24 ENCOUNTER — Other Ambulatory Visit: Payer: Self-pay

## 2020-03-24 VITALS — BP 120/70 | HR 68 | Temp 97.2°F | Ht 60.0 in | Wt 140.0 lb

## 2020-03-24 DIAGNOSIS — E039 Hypothyroidism, unspecified: Secondary | ICD-10-CM

## 2020-03-24 DIAGNOSIS — I422 Other hypertrophic cardiomyopathy: Secondary | ICD-10-CM

## 2020-03-24 DIAGNOSIS — I10 Essential (primary) hypertension: Secondary | ICD-10-CM | POA: Diagnosis not present

## 2020-03-24 DIAGNOSIS — E7800 Pure hypercholesterolemia, unspecified: Secondary | ICD-10-CM | POA: Diagnosis not present

## 2020-03-24 NOTE — Progress Notes (Signed)
Cardiology Office Note:    Date:  03/24/2020   ID:  [name], DOB 09-16-1946, MRN [id number]  PCP:  Nolene Ebbs, MD  Cardiologist:  Sanda Klein, MD  Electrophysiologist:  None   Referring MD: Nolene Ebbs, MD   No chief complaint on file.   History of Present Illness:    Autumn Fitzgerald is a 74 y.o. female with a hx of HOCM s/p septal myomectomy, GERD, HTN, HLD, history of near syncope s/p loop recorder that was explanted in 2017, hypothyroidism, LBBB and prediabetes.  Previous loop recorder did not show any meaningful arrhythmia.  She has chronic dyspnea on exertion even while doing household activity.  Treatment with diuretic after leave her feel extremely weak and have near syncope.  She is able to tolerate low-dose torsemide.  Patient had a Myoview in January 2020 as part of the cardiac clearance prior to her surgery.  Myoview showed EF greater than 65%, overall normal study without prior infarct or ischemia.  Echocardiogram obtained on 09/10/2019 showed EF 60 to 65%, moderately increased left ventricular posterior wall thickness and septal wall thickness, elevated LVEDP.   Although she could not tolerate a full 25 mg daily dose of atenolol due to dizziness and weakness, she feels better on 12.5 mg of atenolol.  It helps prevent palpitations and limits episodes of chest pain.  If she gets very upset (for example when the police were knocking on her front door recently) she develops rapid palpitations and chest tightness that radiates from her spine to the front of her chest).  Occasionally she is taken an extra half dose of atenolol and this helps.  However she cannot tolerate 25 mg daily.  Is exertional dyspnea, orthopnea, PND, edema, syncope, claudication or focal neurological events.  She is clearly very high strung and rather anxious.  Past Medical History:  Diagnosis Date  . Arthritis   . Depression   . GERD (gastroesophageal reflux disease)   . Glaucoma, both eyes    . Head injury, closed, without LOC 02/2013   did not LOC per patient  . Headache   . History of syncope 2014   near syncope,  loop recorder placed and explanted 2017  . HLD (hyperlipidemia)   . Hypertension    "at times"    . 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 Hypothyroidism    followed by pcp  . LBBB (left bundle branch block)   . Mild persistent allergic asthma   . Palpitations   . Panic attacks   . PONV (postoperative nausea and vomiting)   . Pre-diabetes   . Wears glasses     Past Surgical History:  Procedure Laterality Date  . CARDIAC SURGERY  02/05/2007   in [place]  for HOCM  . CATARACT EXTRACTION W/ INTRAOCULAR LENS  IMPLANT, BILATERAL  right 07-23-2017;  left 10-15-2017  . COLONOSCOPY N/A 11/24/2013   Procedure: COLONOSCOPY;  Surgeon: Juanita Craver, MD;  Location: [facility];  Service: Endoscopy;  Laterality: N/A;  . DOPPLER ECHOCARDIOGRAPHY  03/14/2013   EF 65-70%,trivial AI,mild-mod. MR,Mod. TR,mod. concentric hypertrophy  . EP IMPLANTABLE DEVICE N/A 08/22/2016   Procedure: Loop Recorder Removal;  Surgeon: Sanda Klein, MD;  Location: [place] [facility];  Service: Cardiovascular;  Laterality: N/A;  . ESOPHAGOGASTRODUODENOSCOPY N/A 11/24/2013   Procedure: ESOPHAGOGASTRODUODENOSCOPY (EGD);  Surgeon: Juanita Craver, MD;  [facility]: [facility];  Service: Endoscopy;  Laterality: N/A;  . GLAUCOMA SURGERY Bilateral 2019   "release pressure  .  KNEE ARTHROSCOPY Right 10/17/2013   Procedure: ARTHROSCOPY RIGHT KNEE;  Surgeon: [name], MD;  Location: [facility];  Service: Orthopedics;  Laterality: Right;  . LAPAROSCOPIC CHOLECYSTECTOMY  02-03-2010   dr d blackman  @[facility]   . LOOP RECORDER IMPLANT  03/18/2013  . LOOP RECORDER IMPLANT N/A 03/18/2013   Procedure: LOOP RECORDER IMPLANT;  Surgeon: Sanda Klein, MD;  Location: [place] [facility];  Service: Cardiovascular;  Laterality: N/A;  . SHOULDER ARTHROSCOPY WITH ROTATOR CUFF REPAIR Right 01/16/2019    Procedure: Right shoulder arthroscopy with subacromial decompression, distal clavicle resection, rotator cuff repair, bicep tenotomy;  Surgeon: Justice Britain, MD;  Location: [facility];  Service: Orthopedics;  Laterality: Right;  . TUBAL LIGATION  yrs ago    Current Medications: Current Meds  Medication Sig  . acetaminophen (TYLENOL) 500 MG tablet Take 500 mg by mouth every 6 (six) hours as needed for moderate pain or headache.   . albuterol (VENTOLIN HFA) 108 (90 Base) MCG/ACT inhaler INHALE TWO PUFFS INTO THE LUNGS EVERY 6 HOURS AS NEEDED FOR WHEEZING OR SHORTNESS OF BREATH  . atenolol (TENORMIN) 25 MG tablet Take 0.5 tablets (12.5 mg total) by mouth daily.  . benzonatate (TESSALON) 100 MG capsule Take 1 capsule (100 mg total) by mouth every 8 (eight) hours as needed for cough.  . clobetasol ointment (TEMOVATE) 0.05 % Apply to affected area every night for 4 weeks, then every other day for 4 weeks and then twice a week for 4 weeks or until resolution.  . clonazePAM (KLONOPIN) 1 MG tablet Take 2 mg by mouth at bedtime.   . clotrimazole-betamethasone (LOTRISONE) cream Apply to affected areas twice daily for 7-10 days  . Dexlansoprazole (DEXILANT) 30 MG capsule Take 1 capsule by mouth once daily in the morning  . ezetimibe-simvastatin (VYTORIN) 10-40 MG per tablet Take 1 tablet by mouth every evening.   . fluticasone (FLONASE) 50 MCG/ACT nasal spray Place 1 spray into both nostrils daily.  . fluticasone (FLOVENT HFA) 44 MCG/ACT inhaler Inhale 2 puffs into the lungs 2 (two) times daily. With spacer  . levothyroxine (SYNTHROID) 50 MCG tablet Take 50 mcg by mouth daily.   Marland Kitchen torsemide (DEMADEX) 5 MG tablet Take 5 mg by mouth daily.  Marland Kitchen XIIDRA 5 % SOLN Place 1 drop into both eyes 2 (two) times daily.      Allergies:   Amoxicillin-pot clavulanate, Temazepam, and Lipitor [atorvastatin calcium]   Social History   Socioeconomic History  . Marital status: Divorced    Spouse name: Not on file  . Number  of children: 5  . Years of education: 10  . Highest education level: Not on file  Occupational History  . Not on file  Tobacco Use  . Smoking status: Never Smoker  . Smokeless tobacco: Never Used  Substance and Sexual Activity  . Alcohol use: No  . Drug use: No  . Sexual activity: Never    Birth control/protection: Post-menopausal, Surgical    Comment: divorced  Other Topics Concern  . Not on file  Social History Narrative   Lives at home by herself.    Caffeine use: none    Social Determinants of Radio broadcast assistant Strain:   . Difficulty of Paying Living Expenses:   Food Insecurity:   . Worried About Charity fundraiser in the Last Year:   . Arboriculturist in the Last Year:   Transportation Needs:   . Film/video editor (Medical):   Marland Kitchen Lack  of Transportation (Non-Medical):   Physical Activity:   . Days of Exercise per Week:   . Minutes of Exercise per Session:   Stress:   . Feeling of Stress :   Social Connections:   . Frequency of Communication with Friends and Family:   . Frequency of Social Gatherings with Friends and Family:   . Attends Religious Services:   . Active Member of Clubs or Organizations:   . Attends Archivist Meetings:   Marland Kitchen Marital Status:      Family History: The patient's family history includes Breast cancer in her mother; Cancer in her brother, father, and mother; Diabetes in her sister; Heart attack in her sister; Hypertension in her mother. There is no history of Migraines.  ROS:   Please see the history of present illness.    All other systems are reviewed and are negative.   EKGs/Labs/Other Studies Reviewed:    The following studies were reviewed today:  Myoview 12/24/2018  The left ventricular ejection fraction is hyperdynamic (>65%).  Nuclear stress EF: 80%.  There was no ST segment deviation noted during stress.  The study is normal.  This is a low risk study.   Normal pharmacologic nuclear stress  test with no evidence for prior infarct or ischemia. Normal LVEF.   Echo 09/10/2019 IMPRESSIONS    1. Left ventricular ejection fraction, by visual estimation, is 60 to 65%. The left ventricle has normal function. Normal left ventricular size. Left ventricular septal wall thickness was moderately increased. Moderately increased left ventricular  posterior wall thickness. There is moderately increased left ventricular hypertrophy.  2. Elevated left ventricular end-diastolic pressure.  3. Hypertrophic cardiomyopathy.  4. Left ventricular diastolic Doppler parameters are consistent with impaired relaxation pattern of LV diastolic filling.  5. Mid-cavitary gradient noted with a peak velocity of 1.5 m/s, peak gradient 9 mmHg at rest. Gradient increases to 20 mmHg with Valsalva (2.23m/s). There is no significant systolic anterior motion of the mitral valve.  6. Global right ventricle has normal systolic function.The right ventricular size is normal. No increase in right ventricular wall thickness.  7. Left atrial size was normal.  8. Right atrial size was normal.  9. The mitral valve is normal in structure. Trace mitral valve regurgitation. No evidence of mitral stenosis. 10. The tricuspid valve is normal in structure. Tricuspid valve regurgitation is mild. 11. The aortic valve is tricuspid Aortic valve regurgitation was not visualized by color flow Doppler. Structurally normal aortic valve, with no evidence of sclerosis or stenosis. 12. The pulmonic valve was normal in structure. Pulmonic valve regurgitation is trivial by color flow Doppler. 13. Normal pulmonary artery systolic pressure. 14. The inferior vena cava is normal in size with greater than 50% respiratory variability, suggesting right atrial pressure of 3 mmHg.  EKG:  EKG is not ordered today.    Recent Labs: 02/24/2020: BUN 9; Creatinine, Ser 0.97; Hemoglobin 14.5; Platelets 300; Potassium 4.1; Sodium 140  Recent Lipid Panel     Component Value Date/Time   CHOL 145 09/23/2008 2026   TRIG 94 09/23/2008 2026   HDL 53 09/23/2008 2026   CHOLHDL 2.7 Ratio 09/23/2008 2026   VLDL 19 09/23/2008 2026   LDLCALC 73 09/23/2008 2026    Physical Exam:    VS:  BP 120/70   Pulse 68   Temp (!) 97.2 F (36.2 C)   Ht 5' (1.524 m)   Wt 140 lb (63.5 kg)   BMI 27.34 kg/m     Wt  Readings from Last 3 Encounters:  03/24/20 140 lb (63.5 kg)  12/25/19 142 lb 3.2 oz (64.5 kg)  12/03/19 144 lb (65.3 kg)      General: Alert, oriented x3, no distress, sternotomy scar. Head: no evidence of trauma, PERRL, EOMI, no exophtalmos or lid lag, no myxedema, no xanthelasma; normal ears, nose and oropharynx Neck: normal jugular venous pulsations and no hepatojugular reflux; brisk carotid pulses without delay and no carotid bruits Chest: clear to auscultation, no signs of consolidation by percussion or palpation, normal fremitus, symmetrical and full respiratory excursions Cardiovascular: normal position and quality of the apical impulse, regular rhythm, normal first and second heart sounds, no murmurs, rubs or gallops Abdomen: no tenderness or distention, no masses by palpation, no abnormal pulsatility or arterial bruits, normal bowel sounds, no hepatosplenomegaly Extremities: no clubbing, cyanosis or edema; 2+ radial, ulnar and brachial pulses bilaterally; 2+ right femoral, posterior tibial and dorsalis pedis pulses; 2+ left femoral, posterior tibial and dorsalis pedis pulses; no subclavian or femoral bruits Neurological: grossly nonfocal Psych: Normal mood and affect   ASSESSMENT:    1. Hypertrophic cardiomyopathy ([place])   2. Essential hypertension   3. Pure hypercholesterolemia   4. Acquired hypothyroidism    PLAN:    In order of problems listed above:  1. HOCM s/p myomectomy: Her symptoms are clearly related to periods of increased emotional distress and are alleviated by beta-blockers.  Unfortunate she cannot tolerate higher  daily doses of beta-blocker.  Okay to take an extra 12.5 mg of atenolol as needed.  No evidence of residual LV outflow tract obstruction on echo, but it is conceivable she could have some subendocardial ischemia due to LVH.  No evidence of ischemia on recent nuclear stress testing. 2. Hypertension: Well-controlled 3. Hyperlipidemia: Continue Vytorin.  No known CAD.  4. Hypothyroidism: On Synthroid, managed by primary care provider    Medication Adjustments/Labs and Tests Ordered: Current medicines are reviewed at length with the patient today.  Concerns regarding medicines are outlined above.  No orders of the defined types were placed in this encounter.  No orders of the defined types were placed in this encounter.   Patient Instructions  Medication Instructions:  No changes *If you need a refill on your cardiac medications before your next appointment, please call your pharmacy*   Lab Work: None ordered If you have labs (blood work) drawn today and your tests are completely normal, you will receive your results only by: Marland Kitchen MyChart Message (if you have MyChart) OR . A paper copy in the mail If you have any lab test that is abnormal or we need to change your treatment, we will call you to review the results.   Testing/Procedures: None ordered   Follow-Up: At [facility], you and your health needs are our priority.  As part of our continuing mission to provide you with exceptional heart care, we have created designated Provider Care Teams.  These Care Teams include your primary Cardiologist (physician) and Advanced Practice Providers (APPs -  Physician Assistants and Nurse Practitioners) who all work together to provide you with the care you need, when you need it.  We recommend signing up for the patient portal called "MyChart".  Sign up information is provided on this After Visit Summary.  MyChart is used to connect with patients for Virtual Visits (Telemedicine).  Patients are  able to view lab/test results, encounter notes, upcoming appointments, etc.  Non-urgent messages can be sent to your provider as well.   To  learn more about what you can do with MyChart, go to [email or web address].    Your next appointment:   6 month(s)  The format for your next appointment:   In Person  Provider:   You may see Sanda Klein, MD or one of the following Advanced Practice Providers on your designated Care Team:    Almyra Deforest, PA-C  Fabian Sharp, [place] or   Roby Lofts, PA-C       Signed, Sanda Klein, MD  03/24/2020 3:05 PM    [place]

## 2020-03-24 NOTE — Patient Instructions (Signed)

## 2020-06-01 ENCOUNTER — Other Ambulatory Visit: Payer: Self-pay | Admitting: Allergy and Immunology

## 2020-06-23 ENCOUNTER — Ambulatory Visit: Payer: Medicare (Managed Care) | Admitting: Obstetrics and Gynecology

## 2020-06-28 ENCOUNTER — Other Ambulatory Visit: Payer: Self-pay

## 2020-06-28 ENCOUNTER — Ambulatory Visit (INDEPENDENT_AMBULATORY_CARE_PROVIDER_SITE_OTHER): Payer: Medicare (Managed Care) | Admitting: Obstetrics and Gynecology

## 2020-06-28 ENCOUNTER — Other Ambulatory Visit: Payer: Self-pay | Admitting: Allergy and Immunology

## 2020-06-28 ENCOUNTER — Encounter: Payer: Self-pay | Admitting: Obstetrics and Gynecology

## 2020-06-28 VITALS — BP 135/89 | HR 73 | Wt 142.8 lb

## 2020-06-28 DIAGNOSIS — N904 Leukoplakia of vulva: Secondary | ICD-10-CM | POA: Diagnosis not present

## 2020-06-28 MED ORDER — CLOBETASOL PROPIONATE 0.05 % EX OINT: TOPICAL_OINTMENT | CUTANEOUS | 5 refills | Status: DC

## 2020-06-28 NOTE — Progress Notes (Signed)
Pt is in the office for 6 month follow up related to lichen sclerosis. Pt states that prescription ointment has helped.

## 2020-06-28 NOTE — Progress Notes (Signed)
74 yo here for follow up on lichen sclerosis. Patient reports feeling well and is without other complaints. Patient to schedule mammogram in September  Past Medical History:  Diagnosis Date  . Arthritis   . Depression   . GERD (gastroesophageal reflux disease)   . Glaucoma, both eyes   . Head injury, closed, without LOC 02/2013   did not LOC per patient  . Headache   . History of syncope 2014   near syncope,  loop recorder placed and explanted 2017  . HLD (hyperlipidemia)   . Hypertension    "at times"    . 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 Hypothyroidism    followed by pcp  . LBBB (left bundle branch block)   . Mild persistent allergic asthma   . Palpitations   . Panic attacks   . PONV (postoperative nausea and vomiting)   . Pre-diabetes   . Wears glasses    Past Surgical History:  Procedure Laterality Date  . CARDIAC SURGERY  02/05/2007   in [place]  for HOCM  . CATARACT EXTRACTION W/ INTRAOCULAR LENS  IMPLANT, BILATERAL  right 07-23-2017;  left 10-15-2017  . COLONOSCOPY N/A 11/24/2013   Procedure: COLONOSCOPY;  Surgeon: Juanita Craver, MD;  [facility];  Service: Endoscopy;  Laterality: N/A;  . DOPPLER ECHOCARDIOGRAPHY  03/14/2013   EF 65-70%,trivial AI,mild-mod. MR,Mod. TR,mod. concentric hypertrophy  . EP IMPLANTABLE DEVICE N/A 08/22/2016   Procedure: Loop Recorder Removal;  Surgeon: Sanda Klein, MD;  Location: [place] [facility];  Service: Cardiovascular;  Laterality: N/A;  . ESOPHAGOGASTRODUODENOSCOPY N/A 11/24/2013   Procedure: ESOPHAGOGASTRODUODENOSCOPY (EGD);  Surgeon: Juanita Craver, MD;  [facility];  Service: Endoscopy;  Laterality: N/A;  . GLAUCOMA SURGERY Bilateral 2019   "release pressure  . KNEE ARTHROSCOPY Right 10/17/2013   Procedure: ARTHROSCOPY RIGHT KNEE;  Surgeon: [name], MD;  Location: [place];  Service: Orthopedics;  Laterality: Right;  . LAPAROSCOPIC CHOLECYSTECTOMY  02-03-2010   dr d  blackman  @[facility]   . LOOP RECORDER IMPLANT  03/18/2013  . LOOP RECORDER IMPLANT N/A 03/18/2013   Procedure: LOOP RECORDER IMPLANT;  Surgeon: Sanda Klein, MD;  Location: [place] [facility];  Service: Cardiovascular;  Laterality: N/A;  . SHOULDER ARTHROSCOPY WITH ROTATOR CUFF REPAIR Right 01/16/2019   Procedure: Right shoulder arthroscopy with subacromial decompression, distal clavicle resection, rotator cuff repair, bicep tenotomy;  Surgeon: Justice Britain, MD;  [facility];  Service: Orthopedics;  Laterality: Right;  . TUBAL LIGATION  yrs ago   Family History  Problem Relation Age of Onset  . Cancer Mother        breast  . Hypertension Mother   . Breast cancer Mother   . Cancer Father        prostate  . Cancer Brother   . Diabetes Sister   . Heart attack Sister   . Migraines Neg Hx    Social History   Tobacco Use  . Smoking status: Never Smoker  . Smokeless tobacco: Never Used  Vaping Use  . Vaping Use: Never used  Substance Use Topics  . Alcohol use: No  . Drug use: No   ROS See pertinent in HPI. All other systems non contributory  Blood pressure 135/89, pulse 73, weight 142 lb 12.8 oz (64.8 kg).  GENERAL: Well-developed, well-nourished female in no acute distress.  BREASTS: Symmetric in size. No palpable masses or lymphadenopathy, skin changes, or nipple drainage. ABDOMEN: Soft, nontender, softly distended. No organomegaly. PELVIC:  Normal external female genitalia with 1 cm area on posterior aspect of the introitus as well as a small area under introitus measuring 0.5cm. Vagina is pale pink. Unchanged from last year. Normal discharge. Normal appearing cervix. Uterus is normal in size. No adnexal mass or tenderness. EXTREMITIES: No cyanosis, clubbing, or edema, 2+ distal pulses.  [name] 74 yo with lichen sclerosis - Continue clobetasol cream- refill provided - Follow up mammogram in september - Follow up with PCP and cardiologist for chronic health conditions

## 2020-06-29 ENCOUNTER — Ambulatory Visit (INDEPENDENT_AMBULATORY_CARE_PROVIDER_SITE_OTHER): Payer: Medicare (Managed Care) | Admitting: Allergy and Immunology

## 2020-06-29 ENCOUNTER — Encounter: Payer: Self-pay | Admitting: Allergy and Immunology

## 2020-06-29 VITALS — BP 128/72 | HR 62 | Temp 97.6°F | Resp 16 | Ht 60.0 in | Wt 143.0 lb

## 2020-06-29 DIAGNOSIS — L299 Pruritus, unspecified: Secondary | ICD-10-CM | POA: Diagnosis not present

## 2020-06-29 DIAGNOSIS — J4540 Moderate persistent asthma, uncomplicated: Secondary | ICD-10-CM | POA: Diagnosis not present

## 2020-06-29 DIAGNOSIS — K219 Gastro-esophageal reflux disease without esophagitis: Secondary | ICD-10-CM | POA: Diagnosis not present

## 2020-06-29 DIAGNOSIS — J3089 Other allergic rhinitis: Secondary | ICD-10-CM

## 2020-06-29 DIAGNOSIS — R748 Abnormal levels of other serum enzymes: Secondary | ICD-10-CM

## 2020-06-29 MED ORDER — FAMOTIDINE 40 MG PO TABS: ORAL_TABLET | ORAL | 5 refills | Status: DC

## 2020-06-29 NOTE — Progress Notes (Signed)
[facility] [place]   Follow-up Note  Referring Provider: Nolene Ebbs, MD Primary Provider: Nolene Ebbs, MD Date of Office Visit: 06/29/2020  Subjective:   Autumn Fitzgerald (DOB: Dec 03, 1946) is a 74 y.o. female who returns to the [facility] on 06/29/2020 in re-evaluation of the following:  HPI: Bentli returns to this clinic in evaluation of asthma and allergic rhinitis and LPR and a history of intermittent pruritic disorder and a history of IHSS.  I last saw her in his clinic on 30 December 2019.  She has been complaining for the most part about having a little bit of phlegm especially stuck in her throat with lots of throat clearing and a slight cough and some postnasal drip and some intermittent throat pain.  She has been using her Ventolin about twice a week for this issue and she is not really sure the Ventolin does help this issue.  She continues to use Flovent on a regular basis.  It does not sound as though she has required a systemic steroid to treat an exacerbation of her asthma.  She has not really had too much problems with her nose recently.  She has not required a antibiotic for an episode of sinusitis.  She continues to use Flonase on most days.  She believes that her reflux is under very good control at this point in time while using Dexilant on a consistent basis.  She has had a longstanding issue with [place] and has had intermittent issues with a pruritic disorder with unknown etiologic factor.  She was given Lotrisone in the past to deal with this issue but she is not really sure that this is helped much.  Her itching does lead her to scratch her skin and then her skin will turn red.  She continues with diuretic and beta-blocker for her IHSS handled by cardiology.  She has received 2 Pfizer Covid vaccinations.  Allergies as of 06/29/2020      Reactions   Amoxicillin-pot  Clavulanate Other (See Comments)   Severe pains and GI symptoms Did it involve swelling of the face/tongue/throat, SOB, or low BP? No Did it involve sudden or severe rash/hives, skin peeling, or any reaction on the inside of your mouth or nose? No Did you need to seek medical attention at a hospital or doctor's office? No When did it last happen?within the past 10 years If all above answers are "NO", may proceed with cephalosporin use.   Temazepam Other (See Comments)   Dizziness and doesn't work for patient   Lipitor [atorvastatin Calcium]    Muscle pain      Medication List      acetaminophen 500 MG tablet Commonly known as: TYLENOL Take 500 mg by mouth every 6 (six) hours as needed for moderate pain or headache.   albuterol 108 (90 Base) MCG/ACT inhaler Commonly known as: VENTOLIN HFA INHALE TWO PUFFS INTO THE LUNGS EVERY 6 HOURS AS NEEDED FOR WHEEZING OR SHORTNESS OF BREATH   atenolol 25 MG tablet Commonly known as: TENORMIN Take 0.5 tablets (12.5 mg total) by mouth daily.   benzonatate 100 MG capsule Commonly known as: TESSALON Take 1 capsule (100 mg total) by mouth every 8 (eight) hours as needed for cough.   clobetasol ointment 0.05 % Commonly known as: TEMOVATE Apply to affected area every night for 4 weeks, then every other day for 4 weeks and then  twice a week for 4 weeks or until resolution.   clonazePAM 1 MG tablet Commonly known as: KLONOPIN Take 2 mg by mouth at bedtime.   clotrimazole-betamethasone cream Commonly known as: LOTRISONE Apply to affected areas twice daily for 7-10 days   Dexilant 30 MG capsule Generic drug: Dexlansoprazole Take 1 capsule by mouth once daily in the morning   ezetimibe-simvastatin 10-40 MG tablet Commonly known as: VYTORIN Take 1 tablet by mouth every evening.   famotidine 40 MG tablet Commonly known as: PEPCID Take 1 tablet daily in the afternoon Started by: Leona Pressly Kevan Rosebush, MD   Flovent HFA 44 MCG/ACT  inhaler Generic drug: fluticasone Inhale 2 puffs into the lungs 2 (two) times daily. With spacer   fluticasone 50 MCG/ACT nasal spray Commonly known as: Flonase Place 1 spray into both nostrils daily.   Synthroid 50 MCG tablet Generic drug: levothyroxine Take 50 mcg by mouth daily.   torsemide 5 MG tablet Commonly known as: DEMADEX Take 5 mg by mouth daily.   Xiidra 5 % Soln Generic drug: Lifitegrast Place 1 drop into both eyes 2 (two) times daily.       Past Medical History:  Diagnosis Date  . Arthritis   . Depression   . GERD (gastroesophageal reflux disease)   . Glaucoma, both eyes   . Head injury, closed, without LOC 02/2013   did not LOC per patient  . Headache   . History of syncope 2014   near syncope,  loop recorder placed and explanted 2017  . HLD (hyperlipidemia)   . Hypertension    "at times"    . 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 Hypothyroidism    followed by pcp  . LBBB (left bundle branch block)   . Mild persistent allergic asthma   . Palpitations   . Panic attacks   . PONV (postoperative nausea and vomiting)   . Pre-diabetes   . Wears glasses     Past Surgical History:  Procedure Laterality Date  . CARDIAC SURGERY  02/05/2007   in [place]  for HOCM  . CATARACT EXTRACTION W/ INTRAOCULAR LENS  IMPLANT, BILATERAL  right 07-23-2017;  left 10-15-2017  . COLONOSCOPY N/A 11/24/2013   Procedure: COLONOSCOPY;  Surgeon: Juanita Craver, MD;  Location: [facility];  Service: Endoscopy;  Laterality: N/A;  . DOPPLER ECHOCARDIOGRAPHY  03/14/2013   EF 65-70%,trivial AI,mild-mod. MR,Mod. TR,mod. concentric hypertrophy  . EP IMPLANTABLE DEVICE N/A 08/22/2016   Procedure: Loop Recorder Removal;  Surgeon: Sanda Klein, MD;  Location: [facility];  Service: Cardiovascular;  Laterality: N/A;  . ESOPHAGOGASTRODUODENOSCOPY N/A 11/24/2013   Procedure: ESOPHAGOGASTRODUODENOSCOPY (EGD);  Surgeon: Juanita Craver, MD;  Location: [facility];  Service: Endoscopy;  Laterality: N/A;  . GLAUCOMA SURGERY Bilateral 2019   "release pressure  . KNEE ARTHROSCOPY Right 10/17/2013   Procedure: ARTHROSCOPY RIGHT KNEE;  Surgeon: [name], MD;  Location: [facility];  Service: Orthopedics;  Laterality: Right;  . LAPAROSCOPIC CHOLECYSTECTOMY  02-03-2010   dr d blackman  @[facility]   . LOOP RECORDER IMPLANT  03/18/2013  . LOOP RECORDER IMPLANT N/A 03/18/2013   Procedure: LOOP RECORDER IMPLANT;  Surgeon: Sanda Klein, MD;  Location: [facility];  Service: Cardiovascular;  Laterality: N/A;  . SHOULDER ARTHROSCOPY WITH ROTATOR CUFF REPAIR Right 01/16/2019   Procedure: Right shoulder arthroscopy with subacromial decompression, distal clavicle resection, rotator cuff repair, bicep tenotomy;  Surgeon: Justice Britain, MD;  Location: [facility];  Service: Orthopedics;  Laterality:  Right;  Marland Kitchen TUBAL LIGATION  yrs ago    Review of systems negative except as noted in HPI / PMHx or noted below:  Review of Systems  Constitutional: Negative.   HENT: Negative.   Eyes: Negative.   Respiratory: Negative.   Cardiovascular: Negative.   Gastrointestinal: Negative.   Genitourinary: Negative.   Musculoskeletal: Negative.   Skin: Negative.   Neurological: Negative.   Endo/Heme/Allergies: Negative.   Psychiatric/Behavioral: Negative.      Objective:   Vitals:   06/29/20 1414  BP: 128/72  Pulse: 62  Resp: 16  Temp: 97.6 F (36.4 C)  SpO2: 98%   Height: 5' (152.4 cm)  Weight: 143 lb (64.9 kg)   Physical Exam Constitutional:      Appearance: She is not diaphoretic.  HENT:     Head: Normocephalic.     Right Ear: Tympanic membrane, ear canal and external ear normal.     Left Ear: Tympanic membrane, ear canal and external ear normal.     Nose: Nose normal. No mucosal edema or rhinorrhea.     Mouth/Throat:     Pharynx: Uvula midline. No oropharyngeal exudate.  Eyes:     Conjunctiva/sclera: Conjunctivae normal.  Neck:     Thyroid: No thyromegaly.       Trachea: Trachea normal. No tracheal tenderness or tracheal deviation.  Cardiovascular:     Rate and Rhythm: Normal rate and regular rhythm.     Heart sounds: Normal heart sounds, S1 normal and S2 normal. No murmur heard.   Pulmonary:     Effort: No respiratory distress.     Breath sounds: Normal breath sounds. No stridor. No wheezing or rales.  Lymphadenopathy:     Head:     Right side of head: No tonsillar adenopathy.     Left side of head: No tonsillar adenopathy.     Cervical: No cervical adenopathy.  Skin:    Findings: No erythema or rash.     Nails: There is no clubbing.  Neurological:     Mental Status: She is alert.     Diagnostics:    Spirometry was performed and demonstrated an FEV1 of 1.23 at 73 % of predicted.  Assessment and Plan:   1. Not well controlled moderate persistent asthma   2. Other allergic rhinitis   3. LPRD (laryngopharyngeal reflux disease)   4. Pruritic disorder      1.  Continue Flovent 44 two inhalations one time a day    2.  Continue Flonase one spray each nostril 1-2 times per day    3.  Continue Dexilant 60 mg in a.m.  Add Famotidine 40 mg in PM  4.  Continue nasal saline and systane eye drops if needed.  5.  Continue ventolin HFA 2 puffs every 4-6 hours if needed.  6. Can add loratadine 10 mg - 1-2 tablet 1-2 times per day (40 mg MAX / day) for itching. (Dry eye?)  7. Obtain Blood for pruritic disorder: CBC w/diff, CMP, anti-mitochondrial ab  8.  Return to clinic in 6 months or earlier if problem  9. Obtain fall flu vaccine when available  I think Jameisha is doing relatively well with her airway although she does have some cough and some phlegm production especially in her throat.  I think that her throat issue may actually be secondary to her reflux and we will treat her with a little bit more antireflux therapy by adding in a H2 receptor blocker to her proton pump inhibitor.  She will continue on anti-inflammatory agents for  her airway and we will see what happens over the course of the next few months.  She has a pruritic disorder that has not been evaluated in a prolonged period in time.  We will perform some screening blood test as noted above and she can add in loratadine as needed with some attention to the fact that she has dry eye syndrome.  Assuming she does well I will see her back in his clinic in 6 months or earlier if there is a problem.  Allena Katz, MD [facility] [place]

## 2020-06-29 NOTE — Patient Instructions (Addendum)
  1.  Continue Flovent 44 two inhalations one time a day    2.  Continue Flonase one spray each nostril 1-2 times per day    3.  Continue Dexilant 60 mg in a.m. Add Famotidine 40 mg in PM  4.  Continue nasal saline and systane eye drops if needed.  5.  Continue ventolin HFA 2 puffs every 4-6 hours if needed.  6. Can add loratadine 10 mg - 1-2 tablet 1-2 times per day (40 mg MAX / day) for itching. (Dry eye?)  7. Obtain Blood for pruritic disorder: CBC w/diff, CMP, anti-mitochondrial ab  8.  Return to clinic in 6 months or earlier if problem  9. Obtain fall flu vaccine when available

## 2020-06-30 ENCOUNTER — Encounter: Payer: Self-pay | Admitting: Allergy and Immunology

## 2020-06-30 LAB — COMPREHENSIVE METABOLIC PANEL
ALT: 29 IU/L (ref 0–32)
AST: 27 IU/L (ref 0–40)
Albumin/Globulin Ratio: 1.5 (ref 1.2–2.2)
Albumin: 4.4 g/dL (ref 3.7–4.7)
Alkaline Phosphatase: 169 IU/L — ABNORMAL HIGH (ref 48–121)
BUN/Creatinine Ratio: 8 — ABNORMAL LOW (ref 12–28)
BUN: 7 mg/dL — ABNORMAL LOW (ref 8–27)
Bilirubin Total: 1 mg/dL (ref 0.0–1.2)
CO2: 28 mmol/L (ref 20–29)
Calcium: 9.4 mg/dL (ref 8.7–10.3)
Chloride: 103 mmol/L (ref 96–106)
Creatinine, Ser: 0.89 mg/dL (ref 0.57–1.00)
GFR calc Af Amer: 73 mL/min/{1.73_m2} (ref >59)
GFR calc non Af Amer: 64 mL/min/{1.73_m2} (ref >59)
Globulin, Total: 3 g/dL (ref 1.5–4.5)
Glucose: 91 mg/dL (ref 65–99)
Potassium: 4.9 mmol/L (ref 3.5–5.2)
Sodium: 144 mmol/L (ref 134–144)
Total Protein: 7.4 g/dL (ref 6.0–8.5)

## 2020-06-30 LAB — CBC WITH DIFFERENTIAL
Basophils Absolute: 0.1 10*3/uL (ref 0.0–0.2)
Basos: 1 %
EOS (ABSOLUTE): 0.1 10*3/uL (ref 0.0–0.4)
Eos: 1 %
Hematocrit: 46.2 % (ref 34.0–46.6)
Hemoglobin: 14.8 g/dL (ref 11.1–15.9)
Immature Grans (Abs): 0 10*3/uL (ref 0.0–0.1)
Immature Granulocytes: 0 %
Lymphocytes Absolute: 2.3 10*3/uL (ref 0.7–3.1)
Lymphs: 36 %
MCH: 28.1 pg (ref 26.6–33.0)
MCHC: 32 g/dL (ref 31.5–35.7)
MCV: 88 fL (ref 79–97)
Monocytes Absolute: 0.6 10*3/uL (ref 0.1–0.9)
Monocytes: 10 %
Neutrophils Absolute: 3.3 10*3/uL (ref 1.4–7.0)
Neutrophils: 52 %
RBC: 5.27 x10E6/uL (ref 3.77–5.28)
RDW: 12.8 % (ref 11.7–15.4)
WBC: 6.4 10*3/uL (ref 3.4–10.8)

## 2020-06-30 LAB — MITOCHONDRIAL ANTIBODIES: Mitochondrial Ab: <20 Units (ref 0.0–20.0)

## 2020-07-06 ENCOUNTER — Telehealth: Payer: Self-pay

## 2020-07-06 NOTE — Telephone Encounter (Signed)
Patient notified of labs, trying to call [facility] to see if we can add on labs instead of patient to come in due to transportation issues

## 2020-07-06 NOTE — Telephone Encounter (Signed)
Patient called to follow up on her lab results.  Please advise.   Thanks

## 2020-07-06 NOTE — Addendum Note (Signed)
Addended by: Valere Dross on: 07/06/2020 01:42 PM   Modules accepted: Orders

## 2020-07-08 LAB — ALKALINE PHOSPHATASE, ISOENZYMES
Alkaline Phosphatase: 166 IU/L — ABNORMAL HIGH (ref 48–121)
BONE FRACTION: 41 % (ref 14–68)
INTESTINAL FRAC.: 4 % (ref 0–18)
LIVER FRACTION: 55 % (ref 18–85)

## 2020-07-08 LAB — GAMMA GT: GGT: 37 IU/L (ref 0–60)

## 2020-07-08 LAB — SPECIMEN STATUS REPORT

## 2020-07-14 ENCOUNTER — Other Ambulatory Visit: Payer: Self-pay | Admitting: Internal Medicine

## 2020-07-14 DIAGNOSIS — N6320 Unspecified lump in the left breast, unspecified quadrant: Secondary | ICD-10-CM

## 2020-07-20 ENCOUNTER — Other Ambulatory Visit: Payer: Self-pay | Admitting: Internal Medicine

## 2020-07-23 ENCOUNTER — Other Ambulatory Visit: Payer: Medicare (Managed Care)

## 2020-08-05 ENCOUNTER — Other Ambulatory Visit: Payer: Self-pay | Admitting: Allergy and Immunology

## 2020-08-05 ENCOUNTER — Telehealth: Payer: Self-pay | Admitting: Allergy and Immunology

## 2020-08-05 ENCOUNTER — Other Ambulatory Visit: Payer: Self-pay

## 2020-08-05 MED ORDER — ALBUTEROL SULFATE HFA 108 (90 BASE) MCG/ACT IN AERS: INHALATION_SPRAY | RESPIRATORY_TRACT | 1 refills | Status: DC

## 2020-08-05 NOTE — Telephone Encounter (Signed)
Patient states that she still has not gotten her Ventolin prescription from [facility] on [address]. Patient states the pharmacy told her to call the office to see if we could get it refilled?  Please advise.

## 2020-08-05 NOTE — Telephone Encounter (Signed)
Refill sent in. Patient was notified.

## 2020-08-17 ENCOUNTER — Other Ambulatory Visit: Payer: Self-pay

## 2020-08-17 MED ORDER — FLOVENT HFA 44 MCG/ACT IN AERO: 2.0000 | INHALATION_SPRAY | Freq: Two times a day (BID) | RESPIRATORY_TRACT | 5 refills | Status: DC

## 2020-08-17 MED ORDER — FLUTICASONE PROPIONATE 50 MCG/ACT NA SUSP: 1.0000 | Freq: Every day | NASAL | 4 refills | Status: DC

## 2020-08-17 NOTE — Telephone Encounter (Signed)
RF on Flovent 44 mcg and fluticasone x 1 with 4 refills.

## 2020-08-17 NOTE — Telephone Encounter (Signed)
Patient called for a refill on fluticasone (FLOVENT HFA) 44 MCG/ACT inhaler.  [place]

## 2020-10-04 ENCOUNTER — Other Ambulatory Visit: Payer: Self-pay | Admitting: Allergy and Immunology

## 2020-11-11 ENCOUNTER — Ambulatory Visit (INDEPENDENT_AMBULATORY_CARE_PROVIDER_SITE_OTHER): Payer: Medicare (Managed Care) | Admitting: Cardiovascular Disease

## 2020-11-11 ENCOUNTER — Encounter: Payer: Self-pay | Admitting: Cardiovascular Disease

## 2020-11-11 ENCOUNTER — Other Ambulatory Visit: Payer: Self-pay

## 2020-11-11 VITALS — BP 120/77 | HR 98 | Ht 60.0 in | Wt 141.0 lb

## 2020-11-11 DIAGNOSIS — I421 Obstructive hypertrophic cardiomyopathy: Secondary | ICD-10-CM | POA: Diagnosis not present

## 2020-11-11 DIAGNOSIS — E039 Hypothyroidism, unspecified: Secondary | ICD-10-CM | POA: Diagnosis not present

## 2020-11-11 DIAGNOSIS — I10 Essential (primary) hypertension: Secondary | ICD-10-CM | POA: Diagnosis not present

## 2020-11-11 DIAGNOSIS — E7800 Pure hypercholesterolemia, unspecified: Secondary | ICD-10-CM

## 2020-11-11 NOTE — Patient Instructions (Signed)

## 2020-11-11 NOTE — Progress Notes (Signed)
Cardiology Office Note:    Date:  11/13/2020   ID:  Autumn Fitzgerald, DOB [birth year], MRN [id number]  PCP:  Nolene Ebbs, MD  Cardiologist:  Sanda Klein, MD  Electrophysiologist:  None   Referring MD: Nolene Ebbs, MD   Chief Complaint  Patient presents with  . Cardiomyopathy    6 months.    History of Present Illness:    Autumn Fitzgerald is a 74 y.o. female with a hx of HOCM s/p septal myomectomy, GERD, HTN, HLD, history of near syncope s/p loop recorder that was explanted in 2017, hypothyroidism, LBBB and prediabetes.  Previous loop recorder did not show any meaningful arrhythmia. Patient had a normal Myoview in January 2020 as part of the cardiac clearance prior to surgery.   Echocardiogram obtained on 09/10/2019 showed EF 60 to 65%, moderately increased left ventricular posterior wall thickness and septal wall thickness, elevated LVEDP.   She is generally doing well, but as always is very anxious. Terrified of COVID and of her own heart condition.  The patient specifically denies any chest pain at rest exertion, dyspnea at rest or with exertion, orthopnea, paroxysmal nocturnal dyspnea, syncope, palpitations, focal neurological deficits, intermittent claudication, lower extremity edema, unexplained weight gain, cough, hemoptysis or wheezing.  She is generally very sensitive to low doses of meds (including beta blockers and diuretics). Although she could not tolerate a full 25 mg daily dose of atenolol due to dizziness and weakness, she feels better on 12.5 mg of atenolol.  It helps prevent palpitations and limits episodes of chest pain.  Had dyspnea off diuretics, but taking torsemide more than 5 mg daily causes weakness.  Past Medical History:  Diagnosis Date  . Arthritis   . Depression   . GERD (gastroesophageal reflux disease)   . Glaucoma, both eyes   . Head injury, closed, without LOC 02/2013   did not LOC per patient  . Headache   . History of syncope 2014   near  syncope,  loop recorder placed and explanted 2017  . HLD (hyperlipidemia)   . Hypertension    "at times"    . 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 Hypothyroidism    followed by pcp  . LBBB (left bundle branch block)   . Mild persistent allergic asthma   . Palpitations   . Panic attacks   . PONV (postoperative nausea and vomiting)   . Pre-diabetes   . Wears glasses     Past Surgical History:  Procedure Laterality Date  . CARDIAC SURGERY  02/05/2007   in [place]  for HOCM  . CATARACT EXTRACTION W/ INTRAOCULAR LENS  IMPLANT, BILATERAL  right 07-23-2017;  left 10-15-2017  . COLONOSCOPY N/A 11/24/2013   Procedure: COLONOSCOPY;  Surgeon: Juanita Craver, MD;  Location: [facility];  Service: Endoscopy;  Laterality: N/A;  . DOPPLER ECHOCARDIOGRAPHY  03/14/2013   EF 65-70%,trivial AI,mild-mod. MR,Mod. TR,mod. concentric hypertrophy  . EP IMPLANTABLE DEVICE N/A 08/22/2016   Procedure: Loop Recorder Removal;  Surgeon: Sanda Klein, MD;  Location: [facility];  Service: Cardiovascular;  Laterality: N/A;  . ESOPHAGOGASTRODUODENOSCOPY N/A 11/24/2013   Procedure: ESOPHAGOGASTRODUODENOSCOPY (EGD);  Surgeon: Juanita Craver, MD;  Location: [facility];  Service: Endoscopy;  Laterality: N/A;  . GLAUCOMA SURGERY Bilateral 2019   "release pressure  . KNEE ARTHROSCOPY Right 10/17/2013   Procedure: ARTHROSCOPY RIGHT KNEE;  Surgeon: [name], MD;  Location: [facility];  Service: Orthopedics;  Laterality: Right;  . LAPAROSCOPIC CHOLECYSTECTOMY  02-03-2010   dr d blackman  @[facility]   . LOOP RECORDER IMPLANT  03/18/2013  . LOOP RECORDER IMPLANT N/A 03/18/2013   Procedure: LOOP RECORDER IMPLANT;  Surgeon: Sanda Klein, MD;  Location: [facility];  Service: Cardiovascular;  Laterality: N/A;  . SHOULDER ARTHROSCOPY WITH ROTATOR CUFF REPAIR Right 01/16/2019   Procedure: Right shoulder arthroscopy with subacromial decompression, distal clavicle resection, rotator cuff repair, bicep  tenotomy;  Surgeon: Justice Britain, MD;  Location: [facility];  Service: Orthopedics;  Laterality: Right;  . TUBAL LIGATION  yrs ago    Current Medications: Current Meds  Medication Sig  . acetaminophen (TYLENOL) 500 MG tablet Take 500 mg by mouth every 6 (six) hours as needed for moderate pain or headache.   . albuterol (VENTOLIN HFA) 108 (90 Base) MCG/ACT inhaler INHALE TWO PUFFS BY MOUTH EVERY 6 HOURS AS NEEDED FOR WHEEZING OR  SHORTNESS OF BREATH  . atenolol (TENORMIN) 25 MG tablet Take 0.5 tablets (12.5 mg total) by mouth daily.  . benzonatate (TESSALON) 100 MG capsule Take 1 capsule (100 mg total) by mouth every 8 (eight) hours as needed for cough.  . clobetasol ointment (TEMOVATE) 0.05 % Apply to affected area every night for 4 weeks, then every other day for 4 weeks and then twice a week for 4 weeks or until resolution.  . clonazePAM (KLONOPIN) 1 MG tablet Take 2 mg by mouth at bedtime.   . clotrimazole-betamethasone (LOTRISONE) cream Apply to affected areas twice daily for 7-10 days  . Dexlansoprazole (DEXILANT) 30 MG capsule Take 1 capsule by mouth once daily in the morning  . ezetimibe-simvastatin (VYTORIN) 10-40 MG per tablet Take 1 tablet by mouth every evening.   . famotidine (PEPCID) 40 MG tablet Take 1 tablet daily in the afternoon  . fluticasone (FLONASE) 50 MCG/ACT nasal spray Place 1 spray into both nostrils daily.  . fluticasone (FLOVENT HFA) 44 MCG/ACT inhaler Inhale 2 puffs into the lungs 2 (two) times daily. With spacer  . levothyroxine (SYNTHROID) 50 MCG tablet Take 50 mcg by mouth daily.   Marland Kitchen torsemide (DEMADEX) 5 MG tablet Take 5 mg by mouth daily.  Marland Kitchen XIIDRA 5 % SOLN Place 1 drop into both eyes 2 (two) times daily.      Allergies:   Amoxicillin-pot clavulanate, Temazepam, and Lipitor [atorvastatin calcium]   Social History   Socioeconomic History  . Marital status: Divorced    Spouse name: Not on file  . Number of children: 5  . Years of education: 10  . Highest  education level: Not on file  Occupational History  . Not on file  Tobacco Use  . Smoking status: Never Smoker  . Smokeless tobacco: Never Used  Vaping Use  . Vaping Use: Never used  Substance and Sexual Activity  . Alcohol use: No  . Drug use: No  . Sexual activity: Never    Birth control/protection: Post-menopausal, Surgical    Comment: divorced  Other Topics Concern  . Not on file  Social History Narrative   Lives at home by herself.    Caffeine use: none    Social Determinants of Health   Financial Resource Strain:   . Difficulty of Paying Living Expenses: Not on file  Food Insecurity:   . Worried About Charity fundraiser in the Last Year: Not on file  . Ran Out of Food in the Last Year: Not on file  Transportation Needs:   . Lack of Transportation (Medical): Not on file  . Lack  of Transportation (Non-Medical): Not on file  Physical Activity:   . Days of Exercise per Week: Not on file  . Minutes of Exercise per Session: Not on file  Stress:   . Feeling of Stress : Not on file  Social Connections:   . Frequency of Communication with Friends and Family: Not on file  . Frequency of Social Gatherings with Friends and Family: Not on file  . Attends Religious Services: Not on file  . Active Member of Clubs or Organizations: Not on file  . Attends Archivist Meetings: Not on file  . Marital Status: Not on file     Family History: The patient's family history includes Breast cancer in her mother; Cancer in her brother, father, and mother; Diabetes in her sister; Heart attack in her sister; Hypertension in her mother. There is no history of Migraines.  ROS:   Please see the history of present illness.    All other systems are reviewed and are negative.   EKGs/Labs/Other Studies Reviewed:    The following studies were reviewed today:  Myoview 12/24/2018  The left ventricular ejection fraction is hyperdynamic (>65%).  Nuclear stress EF: 80%.  There was  no ST segment deviation noted during stress.  The study is normal.  This is a low risk study.   Normal pharmacologic nuclear stress test with no evidence for prior infarct or ischemia. Normal LVEF.   Echo 09/10/2019 IMPRESSIONS    1. Left ventricular ejection fraction, by visual estimation, is 60 to 65%. The left ventricle has normal function. Normal left ventricular size. Left ventricular septal wall thickness was moderately increased. Moderately increased left ventricular  posterior wall thickness. There is moderately increased left ventricular hypertrophy.  2. Elevated left ventricular end-diastolic pressure.  3. Hypertrophic cardiomyopathy.  4. Left ventricular diastolic Doppler parameters are consistent with impaired relaxation pattern of LV diastolic filling.  5. Mid-cavitary gradient noted with a peak velocity of 1.5 m/s, peak gradient 9 mmHg at rest. Gradient increases to 20 mmHg with Valsalva (2.61m/s). There is no significant systolic anterior motion of the mitral valve.  6. Global right ventricle has normal systolic function.The right ventricular size is normal. No increase in right ventricular wall thickness.  7. Left atrial size was normal.  8. Right atrial size was normal.  9. The mitral valve is normal in structure. Trace mitral valve regurgitation. No evidence of mitral stenosis. 10. The tricuspid valve is normal in structure. Tricuspid valve regurgitation is mild. 11. The aortic valve is tricuspid Aortic valve regurgitation was not visualized by color flow Doppler. Structurally normal aortic valve, with no evidence of sclerosis or stenosis. 12. The pulmonic valve was normal in structure. Pulmonic valve regurgitation is trivial by color flow Doppler. 13. Normal pulmonary artery systolic pressure. 14. The inferior vena cava is normal in size with greater than 50% respiratory variability, suggesting right atrial pressure of 3 mmHg.  EKG:  EKG is not ordered today.     Recent Labs: 02/24/2020: Platelets 300 06/29/2020: ALT 29; BUN 7; Creatinine, Ser 0.89; Hemoglobin 14.8; Potassium 4.9; Sodium 144  Recent Lipid Panel    Component Value Date/Time   CHOL 145 09/23/2008 2026   TRIG 94 09/23/2008 2026   HDL 53 09/23/2008 2026   CHOLHDL 2.7 Ratio 09/23/2008 2026   VLDL 19 09/23/2008 2026   LDLCALC 73 09/23/2008 2026    Physical Exam:    VS:  BP 120/77 (BP Location: Left Arm, Patient Position: Sitting, Cuff Size: Normal)  Pulse 98   Ht 5' (1.524 m)   Wt 141 lb (64 kg)   BMI 27.54 kg/m     Wt Readings from Last 3 Encounters:  11/11/20 141 lb (64 kg)  06/29/20 143 lb (64.9 kg)  06/28/20 142 lb 12.8 oz (64.8 kg)      General: Alert, oriented x3, no distress, sternotomy scar Head: no evidence of trauma, PERRL, EOMI, no exophtalmos or lid lag, no myxedema, no xanthelasma; normal ears, nose and oropharynx Neck: normal jugular venous pulsations and no hepatojugular reflux; brisk carotid pulses without delay and no carotid bruits Chest: clear to auscultation, no signs of consolidation by percussion or palpation, normal fremitus, symmetrical and full respiratory excursions Cardiovascular: normal position and quality of the apical impulse, regular rhythm, normal first and second heart sounds, no murmurs, rubs or gallops Abdomen: no tenderness or distention, no masses by palpation, no abnormal pulsatility or arterial bruits, normal bowel sounds, no hepatosplenomegaly Extremities: no clubbing, cyanosis or edema; 2+ radial, ulnar and brachial pulses bilaterally; 2+ right femoral, posterior tibial and dorsalis pedis pulses; 2+ left femoral, posterior tibial and dorsalis pedis pulses; no subclavian or femoral bruits Neurological: grossly nonfocal Psych: Normal mood and affect   ASSESSMENT:    1. HOCM (hypertrophic obstructive cardiomyopathy) ([place])   2. Essential hypertension   3. Pure hypercholesterolemia   4. Acquired hypothyroidism    PLAN:    In  order of problems listed above:  1. HOCM s/p myomectomy: Mostly asymptomatic, except when excessively emotional. Her symptoms are clearly related to periods of increased emotional distress and are alleviated by beta-blockers.  Unfortunate she cannot tolerate higher daily doses of beta-blocker.  Okay to take an extra 12.5 mg of atenolol as needed.  No evidence of residual LV outflow tract obstruction on echo, but it is conceivable she could have some subendocardial ischemia due to LVH.  No evidence of ischemia on recent nuclear stress testing. 2. Hypertension: Well-controlled 3. Hyperlipidemia: Continue statin.  No known CAD. Lipids were checked by Dr. Jeanie Cooks, will request a copy. 4. Hypothyroidism: On Synthroid, managed by primary care provider. Clinically euthyroid.    Medication Adjustments/Labs and Tests Ordered: Current medicines are reviewed at length with the patient today.  Concerns regarding medicines are outlined above.  Orders Placed This Encounter  Procedures  . EKG 12-Lead   No orders of the defined types were placed in this encounter.   Patient Instructions  Medication Instructions:  No changes *If you need a refill on your cardiac medications before your next appointment, please call your pharmacy*   Lab Work: None ordered If you have labs (blood work) drawn today and your tests are completely normal, you will receive your results only by: Marland Kitchen MyChart Message (if you have MyChart) OR . A paper copy in the mail If you have any lab test that is abnormal or we need to change your treatment, we will call you to review the results.   Testing/Procedures: None ordered   Follow-Up: At [facility], you and your health needs are our priority.  As part of our continuing mission to provide you with exceptional heart care, we have created designated Provider Care Teams.  These Care Teams include your primary Cardiologist (physician) and Advanced Practice Providers (APPs -   Physician Assistants and Nurse Practitioners) who all work together to provide you with the care you need, when you need it.  We recommend signing up for the patient portal called "MyChart".  Sign up information is provided  on this After Visit Summary.  MyChart is used to connect with patients for Virtual Visits (Telemedicine).  Patients are able to view lab/test results, encounter notes, upcoming appointments, etc.  Non-urgent messages can be sent to your provider as well.   To learn more about what you can do with MyChart, go to [email or web address].    Your next appointment:   6 month(s)  The format for your next appointment:   In Person  Provider:   You may see Sanda Klein, MD or one of the following Advanced Practice Providers on your designated Care Team:    Almyra Deforest, PA-C  Fabian Sharp, [place] or   Roby Lofts, PA-C       Signed, Sanda Klein, MD  11/13/2020 10:50 AM    [place]

## 2020-11-13 ENCOUNTER — Encounter: Payer: Self-pay | Admitting: Cardiovascular Disease

## 2020-12-07 ENCOUNTER — Telehealth: Payer: Self-pay | Admitting: Allergy and Immunology

## 2020-12-07 MED ORDER — ALBUTEROL SULFATE HFA 108 (90 BASE) MCG/ACT IN AERS: INHALATION_SPRAY | RESPIRATORY_TRACT | 1 refills | Status: DC

## 2020-12-07 NOTE — Telephone Encounter (Signed)
Refill sent in and patient made aware.  

## 2020-12-07 NOTE — Telephone Encounter (Signed)
Patient needs a refill on Ventolin sent to [facility] on [address]. Pharmacy was trying to send over a refill request, but our office was closed.  Please advise.

## 2020-12-09 ENCOUNTER — Other Ambulatory Visit: Payer: Self-pay

## 2020-12-09 MED ORDER — ALBUTEROL SULFATE HFA 108 (90 BASE) MCG/ACT IN AERS: INHALATION_SPRAY | RESPIRATORY_TRACT | 1 refills | Status: DC

## 2021-01-03 ENCOUNTER — Other Ambulatory Visit: Payer: Self-pay | Admitting: Internal Medicine

## 2021-01-04 ENCOUNTER — Ambulatory Visit (INDEPENDENT_AMBULATORY_CARE_PROVIDER_SITE_OTHER): Payer: Medicare (Managed Care) | Admitting: Allergy and Immunology

## 2021-01-04 ENCOUNTER — Other Ambulatory Visit: Payer: Self-pay

## 2021-01-04 ENCOUNTER — Encounter: Payer: Self-pay | Admitting: Allergy and Immunology

## 2021-01-04 VITALS — BP 110/78 | HR 84 | Temp 98.1°F | Resp 18 | Ht 60.0 in | Wt 141.6 lb

## 2021-01-04 DIAGNOSIS — J3089 Other allergic rhinitis: Secondary | ICD-10-CM

## 2021-01-04 DIAGNOSIS — L299 Pruritus, unspecified: Secondary | ICD-10-CM

## 2021-01-04 DIAGNOSIS — K219 Gastro-esophageal reflux disease without esophagitis: Secondary | ICD-10-CM | POA: Diagnosis not present

## 2021-01-04 DIAGNOSIS — J4540 Moderate persistent asthma, uncomplicated: Secondary | ICD-10-CM

## 2021-01-04 LAB — COMPLETE METABOLIC PANEL WITH GFR
AG Ratio: 1.6 (calc) (ref 1.0–2.5)
ALT: 29 U/L (ref 6–29)
AST: 32 U/L (ref 10–35)
Albumin: 4.5 g/dL (ref 3.6–5.1)
Alkaline phosphatase (APISO): 125 U/L (ref 37–153)
BUN/Creatinine Ratio: 7 (calc) (ref 6–22)
BUN: 6 mg/dL — ABNORMAL LOW (ref 7–25)
CO2: 29 mmol/L (ref 20–32)
Calcium: 9.3 mg/dL (ref 8.6–10.4)
Chloride: 101 mmol/L (ref 98–110)
Creat: 0.83 mg/dL (ref 0.60–0.93)
GFR, Est African American: 80 mL/min/{1.73_m2} (ref >=60)
GFR, Est Non African American: 69 mL/min/{1.73_m2} (ref >=60)
Globulin: 2.8 g/dL (calc) (ref 1.9–3.7)
Glucose, Bld: 83 mg/dL (ref 65–99)
Potassium: 4.2 mmol/L (ref 3.5–5.3)
Sodium: 141 mmol/L (ref 135–146)
Total Bilirubin: 1 mg/dL (ref 0.2–1.2)
Total Protein: 7.3 g/dL (ref 6.1–8.1)

## 2021-01-04 LAB — CBC
HCT: 43.8 % (ref 35.0–45.0)
Hemoglobin: 14.6 g/dL (ref 11.7–15.5)
MCH: 28.7 pg (ref 27.0–33.0)
MCHC: 33.3 g/dL (ref 32.0–36.0)
MCV: 86.2 fL (ref 80.0–100.0)
MPV: 12.2 fL (ref 7.5–12.5)
Platelets: 278 10*3/uL (ref 140–400)
RBC: 5.08 10*6/uL (ref 3.80–5.10)
RDW: 12.6 % (ref 11.0–15.0)
WBC: 7.1 10*3/uL (ref 3.8–10.8)

## 2021-01-04 LAB — LIPID PANEL
Cholesterol: 190 mg/dL (ref <200)
HDL: 52 mg/dL (ref >=50)
LDL Cholesterol (Calc): 97 mg/dL (calc)
Non-HDL Cholesterol (Calc): 138 mg/dL (calc) — ABNORMAL HIGH (ref <130)
Total CHOL/HDL Ratio: 3.7 (calc) (ref <5.0)
Triglycerides: 305 mg/dL — ABNORMAL HIGH (ref <150)

## 2021-01-04 LAB — TSH: TSH: 3.92 mIU/L (ref 0.40–4.50)

## 2021-01-04 MED ORDER — FAMOTIDINE 40 MG PO TABS: 40.0000 mg | ORAL_TABLET | Freq: Every day | ORAL | 5 refills | Status: DC

## 2021-01-04 MED ORDER — FLOVENT HFA 44 MCG/ACT IN AERO: 2.0000 | INHALATION_SPRAY | Freq: Every day | RESPIRATORY_TRACT | 5 refills | Status: DC

## 2021-01-04 MED ORDER — LORATADINE 10 MG PO TABS: ORAL_TABLET | ORAL | 5 refills | Status: DC

## 2021-01-04 MED ORDER — ALBUTEROL SULFATE HFA 108 (90 BASE) MCG/ACT IN AERS: 2.0000 | INHALATION_SPRAY | RESPIRATORY_TRACT | 3 refills | Status: DC | PRN

## 2021-01-04 MED ORDER — POLYETHYL GLYCOL-PROPYL GLYCOL 0.4-0.3 % OP SOLN: OPHTHALMIC | 2 refills | Status: DC

## 2021-01-04 MED ORDER — FAMOTIDINE 40 MG PO TABS: ORAL_TABLET | ORAL | 5 refills | Status: DC

## 2021-01-04 MED ORDER — DEXLANSOPRAZOLE 60 MG PO CPDR: 60.0000 mg | DELAYED_RELEASE_CAPSULE | Freq: Every day | ORAL | 5 refills | Status: DC

## 2021-01-04 MED ORDER — FLUTICASONE PROPIONATE 50 MCG/ACT NA SUSP: 1.0000 | Freq: Every day | NASAL | 4 refills | Status: DC

## 2021-01-04 NOTE — Patient Instructions (Addendum)
  1.  Continue Flovent 44 two inhalations one time a day    2.  Continue Flonase one spray each nostril 1-2 times per day    3.  Continue Dexilant 60 mg in a.m.  Can add Famotidine 40 mg in PM if needed  4.  Continue nasal saline and systane eye drops if needed.  5.  Continue albuterol HFA 2 puffs every 4-6 hours if needed.  6. Can add loratadine 10 mg - 1-2 tablet 1-2 times per day (40 mg MAX / day) for itching.    7. Return to clinic in 6 months or earlier if problem

## 2021-01-04 NOTE — Progress Notes (Signed)
[facility] [place] [facility] [place]   Follow-up Note  Referring Provider: Nolene Ebbs, MD Primary Provider: Nolene Ebbs, MD Date of Office Visit: 01/04/2021  Subjective:   Autumn Fitzgerald (DOB: 29-Sep-1946) is a 75 y.o. female who returns to the [facility] on 01/04/2021 in re-evaluation of the following:  HPI: Autumn Fitzgerald returns to this clinic in evaluation of asthma and allergic rhinitis and LPR and a history of intermittent pruritic disorder and urticaria.  I last saw her in this clinic on 29 June 2020.  Overall her airway has really done quite well.  She has no complaints involving her nose or her chest or her throat.  She uses a short acting bronchodilator one or two times per week for some occasional slight cough.  She continues to use Flovent and a nasal steroid although not very consistently.  She has had no problems with reflux while using her proton pump inhibitor.  She apparently did eat contaminated lettuce on 19 December 2020 and developed abdominal pain and nausea and what sounds like some possible vertigo lasting about 1 week but fortunately completely resolved.  She has not been having any issues with urticaria or pruritic disorder and she does not need to use any antihistamines.  She has received three Pfizer Covid vaccines and a flu vaccine.  Allergies as of 01/04/2021      Reactions   Amoxicillin-pot Clavulanate Other (See Comments)   Severe pains and GI symptoms Did it involve swelling of the face/tongue/throat, SOB, or low BP? No Did it involve sudden or severe rash/hives, skin peeling, or any reaction on the inside of your mouth or nose? No Did you need to seek medical attention at a hospital or doctor's office? No When did it last happen?within the past 10 years If all above answers are "NO", may proceed with cephalosporin use.   Temazepam Other (See Comments)   Dizziness and doesn't work for patient   Lipitor  [atorvastatin Calcium]    Muscle pain      Medication List      acetaminophen 500 MG tablet Commonly known as: TYLENOL Take 500 mg by mouth every 6 (six) hours as needed for moderate pain or headache.   albuterol 108 (90 Base) MCG/ACT inhaler Commonly known as: VENTOLIN HFA INHALE TWO PUFFS BY MOUTH EVERY 6 HOURS AS NEEDED FOR WHEEZING OR  SHORTNESS OF BREATH   atenolol 25 MG tablet Commonly known as: TENORMIN Take 0.5 tablets (12.5 mg total) by mouth daily.   benzonatate 100 MG capsule Commonly known as: TESSALON Take 1 capsule (100 mg total) by mouth every 8 (eight) hours as needed for cough.   clobetasol ointment 0.05 % Commonly known as: TEMOVATE Apply to affected area every night for 4 weeks, then every other day for 4 weeks and then twice a week for 4 weeks or until resolution.   clonazePAM 1 MG tablet Commonly known as: KLONOPIN Take 2 mg by mouth at bedtime.   clotrimazole-betamethasone cream Commonly known as: LOTRISONE Apply to affected areas twice daily for 7-10 days   Dexilant 30 MG capsule Generic drug: Dexlansoprazole Take 1 capsule by mouth once daily in the morning   ezetimibe-simvastatin 10-40 MG tablet Commonly known as: VYTORIN Take 1 tablet by mouth every evening.   famotidine 40 MG tablet Commonly known as: PEPCID Take 1 tablet daily in the afternoon   Flovent HFA 44 MCG/ACT inhaler Generic drug: fluticasone Inhale 2 puffs into the lungs  2 (two) times daily. With spacer   fluticasone 50 MCG/ACT nasal spray Commonly known as: Flonase Place 1 spray into both nostrils daily.   levothyroxine 50 MCG tablet Commonly known as: SYNTHROID Take 50 mcg by mouth daily.   torsemide 5 MG tablet Commonly known as: DEMADEX Take 5 mg by mouth daily.   Xiidra 5 % Soln Generic drug: Lifitegrast Place 1 drop into both eyes 2 (two) times daily.       Past Medical History:  Diagnosis Date  . Arthritis   . Depression   . GERD (gastroesophageal  reflux disease)   . Glaucoma, both eyes   . Head injury, closed, without LOC 02/2013   did not LOC per patient  . Headache   . History of syncope 2014   near syncope,  loop recorder placed and explanted 2017  . HLD (hyperlipidemia)   . Hypertension    "at times"    . 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 Hypothyroidism    followed by pcp  . LBBB (left bundle branch block)   . Mild persistent allergic asthma   . Palpitations   . Panic attacks   . PONV (postoperative nausea and vomiting)   . Pre-diabetes   . Wears glasses     Past Surgical History:  Procedure Laterality Date  . CARDIAC SURGERY  02/05/2007   in [place]  for HOCM  . CATARACT EXTRACTION W/ INTRAOCULAR LENS  IMPLANT, BILATERAL  right 07-23-2017;  left 10-15-2017  . COLONOSCOPY N/A 11/24/2013   Procedure: COLONOSCOPY;  Surgeon: Juanita Craver, MD;  Location: [facility];  Service: Endoscopy;  Laterality: N/A;  . DOPPLER ECHOCARDIOGRAPHY  03/14/2013   EF 65-70%,trivial AI,mild-mod. MR,Mod. TR,mod. concentric hypertrophy  . EP IMPLANTABLE DEVICE N/A 08/22/2016   Procedure: Loop Recorder Removal;  Surgeon: Sanda Klein, MD;  Location: [facility];  Service: Cardiovascular;  Laterality: N/A;  . ESOPHAGOGASTRODUODENOSCOPY N/A 11/24/2013   Procedure: ESOPHAGOGASTRODUODENOSCOPY (EGD);  Surgeon: Juanita Craver, MD;  Location: [facility];  Service: Endoscopy;  Laterality: N/A;  . GLAUCOMA SURGERY Bilateral 2019   "release pressure  . KNEE ARTHROSCOPY Right 10/17/2013   Procedure: ARTHROSCOPY RIGHT KNEE;  Surgeon: [name], MD;  Location: [facility];  Service: Orthopedics;  Laterality: Right;  . LAPAROSCOPIC CHOLECYSTECTOMY  02-03-2010   dr d blackman  @[facility]   . LOOP RECORDER IMPLANT  03/18/2013  . LOOP RECORDER IMPLANT N/A 03/18/2013   Procedure: LOOP RECORDER IMPLANT;  Surgeon: Sanda Klein, MD;  Location: [facility];  Service: Cardiovascular;  Laterality: N/A;  . SHOULDER ARTHROSCOPY  WITH ROTATOR CUFF REPAIR Right 01/16/2019   Procedure: Right shoulder arthroscopy with subacromial decompression, distal clavicle resection, rotator cuff repair, bicep tenotomy;  Surgeon: Justice Britain, MD;  Location: [facility];  Service: Orthopedics;  Laterality: Right;  . TUBAL LIGATION  yrs ago    Review of systems negative except as noted in HPI / PMHx or noted below:  Review of Systems  Constitutional: Negative.   HENT: Negative.   Eyes: Negative.   Respiratory: Negative.   Cardiovascular: Negative.   Gastrointestinal: Negative.   Genitourinary: Negative.   Musculoskeletal: Negative.   Skin: Negative.   Neurological: Negative.   Endo/Heme/Allergies: Negative.   Psychiatric/Behavioral: Negative.      Objective:   Vitals:   01/04/21 1333  BP: 110/78  Pulse: 84  Resp: 18  Temp: 98.1 F (36.7 C)  SpO2: 97%   Height: 5' (152.4 cm)  Weight: 141 lb  9.6 oz (64.2 kg)   Physical Exam Constitutional:      Appearance: She is not diaphoretic.  HENT:     Head: Normocephalic.     Right Ear: Tympanic membrane, ear canal and external ear normal.     Left Ear: Tympanic membrane, ear canal and external ear normal.     Nose: Nose normal. No mucosal edema or rhinorrhea.     Mouth/Throat:     Mouth: Oropharynx is clear and moist and mucous membranes are normal.     Pharynx: Uvula midline. No oropharyngeal exudate.  Eyes:     Conjunctiva/sclera: Conjunctivae normal.  Neck:     Thyroid: No thyromegaly.     Trachea: Trachea normal. No tracheal tenderness or tracheal deviation.  Cardiovascular:     Rate and Rhythm: Normal rate and regular rhythm.     Heart sounds: Normal heart sounds, S1 normal and S2 normal. No murmur heard.   Pulmonary:     Effort: No respiratory distress.     Breath sounds: Normal breath sounds. No stridor. No wheezing or rales.  Musculoskeletal:        General: No edema.  Lymphadenopathy:     Head:     Right side of head: No tonsillar adenopathy.     Left  side of head: No tonsillar adenopathy.     Cervical: No cervical adenopathy.  Skin:    Findings: No erythema or rash.     Nails: There is no clubbing.  Neurological:     Mental Status: She is alert.     Diagnostics:    Spirometry was performed and demonstrated an FEV1 of 1.19 at 72 % of predicted.  The patient had an Asthma Control Test with the following results: ACT Total Score: 21.    Assessment and Plan:   1. Asthma, moderate persistent, well-controlled   2. Other allergic rhinitis   3. LPRD (laryngopharyngeal reflux disease)   4. Pruritic disorder    1.  Continue Flovent 44 two inhalations one time a day    2.  Continue Flonase one spray each nostril 1-2 times per day    3.  Continue Dexilant 60 mg in a.m.  Can add Famotidine 40 mg in PM if needed  4.  Continue nasal saline and systane eye drops if needed.  5.  Continue albuterol HFA 2 puffs every 4-6 hours if needed.  6.  Can add loratadine 10 mg - 1-2 tablet 1-2 times per day (40 mg MAX / day) for itching.    7.  Return to clinic in 6 months or earlier if problem  Overall, Autumn Fitzgerald appears to be doing quite well regarding her respiratory tract while using anti-inflammatory medications for her airway and consistent use of a proton pump inhibitor.  Her pruritic disorder does not appear to be very active at this point.  Certainly she has other medications she can add into her medical regime should they be required such as an H2 receptor blocker for increased reflux and an H1 receptor blocker should she develop a pruritic disorder once again.  Assuming she does well with this plan I will see her back in this clinic in 6 months or earlier if there is a problem.  Allena Katz, MD Allergy / Immunology [facility]

## 2021-01-05 ENCOUNTER — Encounter: Payer: Self-pay | Admitting: Allergy and Immunology

## 2021-02-07 ENCOUNTER — Other Ambulatory Visit (HOSPITAL_COMMUNITY)
Admission: RE | Admit: 2021-02-07 | Discharge: 2021-02-07 | Disposition: A | Discharge status: Home / Self Care | Payer: Medicare (Managed Care) | Source: Ambulatory Visit | Attending: Obstetrics and Gynecology | Admitting: Obstetrics and Gynecology

## 2021-02-07 ENCOUNTER — Encounter: Payer: Self-pay | Admitting: Obstetrics and Gynecology

## 2021-02-07 ENCOUNTER — Ambulatory Visit (INDEPENDENT_AMBULATORY_CARE_PROVIDER_SITE_OTHER): Payer: Medicare (Managed Care) | Admitting: Obstetrics and Gynecology

## 2021-02-07 ENCOUNTER — Other Ambulatory Visit: Payer: Self-pay

## 2021-02-07 VITALS — BP 130/80 | HR 91 | Wt 138.0 lb

## 2021-02-07 DIAGNOSIS — Z1231 Encounter for screening mammogram for malignant neoplasm of breast: Secondary | ICD-10-CM | POA: Diagnosis not present

## 2021-02-07 DIAGNOSIS — R300 Dysuria: Secondary | ICD-10-CM

## 2021-02-07 DIAGNOSIS — N760 Acute vaginitis: Secondary | ICD-10-CM

## 2021-02-07 DIAGNOSIS — R1032 Left lower quadrant pain: Secondary | ICD-10-CM

## 2021-02-07 DIAGNOSIS — N904 Leukoplakia of vulva: Secondary | ICD-10-CM

## 2021-02-07 MED ORDER — CLOBETASOL PROPIONATE 0.05 % EX OINT: TOPICAL_OINTMENT | CUTANEOUS | 5 refills | Status: DC

## 2021-02-07 NOTE — Progress Notes (Signed)
GYN presents for FU Lichen Sclerosis. C/o pelvic pain, back pain 7- 8/10 x 1 weeks, urinary frequency.

## 2021-02-07 NOTE — Progress Notes (Signed)
75 yo P4 postmenopausal with BMI 26 presenting today for follow up on lichen sclerosis. Patient reports doing well with clobetasol cream. Patient reports a 1-week history of LLQ pain, dysuria and pruritic pelvic discharge. Patient reports pain at times radiates to her lower back. Patient is otherwise without complaints  Past Medical History:  Diagnosis Date  . Arthritis   . Depression   . GERD (gastroesophageal reflux disease)   . Glaucoma, both eyes   . Head injury, closed, without LOC 02/2013   did not LOC per patient  . Headache   . History of syncope 2014   near syncope,  loop recorder placed and explanted 2017  . HLD (hyperlipidemia)   . Hypertension    "at times"    . 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 Hypothyroidism    followed by pcp  . LBBB (left bundle branch block)   . Mild persistent allergic asthma   . Palpitations   . Panic attacks   . PONV (postoperative nausea and vomiting)   . Pre-diabetes   . Wears glasses    Past Surgical History:  Procedure Laterality Date  . CARDIAC SURGERY  02/05/2007   in [place]  for HOCM  . CATARACT EXTRACTION W/ INTRAOCULAR LENS  IMPLANT, BILATERAL  right 07-23-2017;  left 10-15-2017  . COLONOSCOPY N/A 11/24/2013   Procedure: COLONOSCOPY;  Surgeon: Juanita Craver, MD;  Location: [facility];  Service: Endoscopy;  Laterality: N/A;  . DOPPLER ECHOCARDIOGRAPHY  03/14/2013   EF 65-70%,trivial AI,mild-mod. MR,Mod. TR,mod. concentric hypertrophy  . EP IMPLANTABLE DEVICE N/A 08/22/2016   Procedure: Loop Recorder Removal;  Surgeon: Sanda Klein, MD;  Location: [facility];  Service: Cardiovascular;  Laterality: N/A;  . ESOPHAGOGASTRODUODENOSCOPY N/A 11/24/2013   Procedure: ESOPHAGOGASTRODUODENOSCOPY (EGD);  Surgeon: Juanita Craver, MD;  Location: [facility];  Service: Endoscopy;  Laterality: N/A;  . GLAUCOMA SURGERY Bilateral 2019   "release pressure  . KNEE ARTHROSCOPY Right 10/17/2013   Procedure:  ARTHROSCOPY RIGHT KNEE;  Surgeon: [name], MD;  Location: [facility];  Service: Orthopedics;  Laterality: Right;  . LAPAROSCOPIC CHOLECYSTECTOMY  02-03-2010   dr d blackman  @[facility]   . LOOP RECORDER IMPLANT  03/18/2013  . LOOP RECORDER IMPLANT N/A 03/18/2013   Procedure: LOOP RECORDER IMPLANT;  Surgeon: Sanda Klein, MD;  Location: [facility];  Service: Cardiovascular;  Laterality: N/A;  . SHOULDER ARTHROSCOPY WITH ROTATOR CUFF REPAIR Right 01/16/2019   Procedure: Right shoulder arthroscopy with subacromial decompression, distal clavicle resection, rotator cuff repair, bicep tenotomy;  Surgeon: Justice Britain, MD;  Location: [facility];  Service: Orthopedics;  Laterality: Right;  . TUBAL LIGATION  yrs ago   Family History  Problem Relation Age of Onset  . Cancer Mother        breast  . Hypertension Mother   . Breast cancer Mother   . Cancer Father        prostate  . Cancer Brother   . Diabetes Sister   . Heart attack Sister   . Migraines Neg Hx    Social History   Tobacco Use  . Smoking status: Never Smoker  . Smokeless tobacco: Never Used  Vaping Use  . Vaping Use: Never used  Substance Use Topics  . Alcohol use: No  . Drug use: No   ROS See pertinent in HPI. All other systems reviewed and non contributory  Blood pressure 130/80, pulse 91, weight 138 lb (62.6 kg). GENERAL: Well-developed, well-nourished female in no  acute distress.  BREASTS: Symmetric in size. No palpable masses or lymphadenopathy, skin changes, or nipple drainage. ABDOMEN: Soft, nontender, nondistended. No organomegaly. PELVIC: Normal external female genitalia. Vagina is pale and atrophic with unchanged area of lichen sclerosis on the inferior aspect of the introitus and sub urethral area.  Normal discharge. Normal appearing cervix. Uterus is normal in size. No adnexal mass or tenderness. EXTREMITIES: No cyanosis, clubbing, or edema, 2+ distal pulses.  [name] 75 yo with lichen sclerosis and new onset LLQ pain,  dysuria and vaginitis - Patient to continue using clobetasol cream as previous - vaginal swab collected to rule out yeast/bv infection - urine culture sent - pelvic ultrasound ordered - Screening mammogram ordered - Patient will be contacted with abnormal results

## 2021-02-08 LAB — CERVICOVAGINAL ANCILLARY ONLY
Bacterial Vaginitis (gardnerella): NEGATIVE
Candida Glabrata: NEGATIVE
Candida Vaginitis: NEGATIVE
Comment: NEGATIVE
Comment: NEGATIVE
Comment: NEGATIVE

## 2021-02-09 LAB — URINE CULTURE

## 2021-02-14 ENCOUNTER — Ambulatory Visit
Admission: RE | Admit: 2021-02-14 | Discharge: 2021-02-14 | Disposition: A | Discharge status: Home / Self Care | Payer: Medicare (Managed Care) | Source: Ambulatory Visit | Attending: Obstetrics and Gynecology | Admitting: Obstetrics and Gynecology

## 2021-02-14 ENCOUNTER — Other Ambulatory Visit: Payer: Self-pay

## 2021-02-14 DIAGNOSIS — R1032 Left lower quadrant pain: Secondary | ICD-10-CM | POA: Diagnosis present

## 2021-02-20 ENCOUNTER — Other Ambulatory Visit: Payer: Self-pay | Admitting: Allergy and Immunology

## 2021-04-21 ENCOUNTER — Emergency Department (HOSPITAL_COMMUNITY): Payer: Medicare (Managed Care)

## 2021-04-21 ENCOUNTER — Encounter (HOSPITAL_COMMUNITY): Payer: Self-pay | Admitting: Emergency Medicine

## 2021-04-21 ENCOUNTER — Ambulatory Visit (HOSPITAL_COMMUNITY)
Admission: EM | Admit: 2021-04-21 | Discharge: 2021-04-21 | Disposition: A | Discharge status: Home / Self Care | Payer: Medicare (Managed Care) | Attending: Family | Admitting: Family

## 2021-04-21 ENCOUNTER — Other Ambulatory Visit: Payer: Self-pay

## 2021-04-21 ENCOUNTER — Emergency Department (HOSPITAL_COMMUNITY)
Admission: EM | Admit: 2021-04-21 | Discharge: 2021-04-22 | Disposition: A | Discharge status: Home / Self Care | Payer: Medicare (Managed Care) | Attending: Emergency Medicine | Admitting: Emergency Medicine

## 2021-04-21 ENCOUNTER — Encounter (HOSPITAL_COMMUNITY): Payer: Self-pay

## 2021-04-21 DIAGNOSIS — R531 Weakness: Secondary | ICD-10-CM | POA: Diagnosis not present

## 2021-04-21 DIAGNOSIS — E039 Hypothyroidism, unspecified: Secondary | ICD-10-CM | POA: Insufficient documentation

## 2021-04-21 DIAGNOSIS — I10 Essential (primary) hypertension: Secondary | ICD-10-CM | POA: Insufficient documentation

## 2021-04-21 DIAGNOSIS — R5383 Other fatigue: Secondary | ICD-10-CM

## 2021-04-21 DIAGNOSIS — R42 Dizziness and giddiness: Secondary | ICD-10-CM

## 2021-04-21 DIAGNOSIS — D329 Benign neoplasm of meninges, unspecified: Secondary | ICD-10-CM

## 2021-04-21 DIAGNOSIS — Z79899 Other long term (current) drug therapy: Secondary | ICD-10-CM | POA: Insufficient documentation

## 2021-04-21 DIAGNOSIS — Z7951 Long term (current) use of inhaled steroids: Secondary | ICD-10-CM | POA: Diagnosis not present

## 2021-04-21 DIAGNOSIS — R002 Palpitations: Secondary | ICD-10-CM

## 2021-04-21 DIAGNOSIS — I447 Left bundle-branch block, unspecified: Secondary | ICD-10-CM | POA: Diagnosis not present

## 2021-04-21 DIAGNOSIS — R519 Headache, unspecified: Secondary | ICD-10-CM

## 2021-04-21 DIAGNOSIS — N3000 Acute cystitis without hematuria: Secondary | ICD-10-CM

## 2021-04-21 DIAGNOSIS — J4530 Mild persistent asthma, uncomplicated: Secondary | ICD-10-CM | POA: Insufficient documentation

## 2021-04-21 LAB — CBC WITH DIFFERENTIAL/PLATELET
Abs Immature Granulocytes: 0.02 10*3/uL (ref 0.00–0.07)
Basophils Absolute: 0.1 10*3/uL (ref 0.0–0.1)
Basophils Relative: 1 %
Eosinophils Absolute: 0 10*3/uL (ref 0.0–0.5)
Eosinophils Relative: 0 %
HCT: 45.8 % (ref 36.0–46.0)
Hemoglobin: 14.8 g/dL (ref 12.0–15.0)
Immature Granulocytes: 0 %
Lymphocytes Relative: 30 %
Lymphs Abs: 2.1 10*3/uL (ref 0.7–4.0)
MCH: 28.7 pg (ref 26.0–34.0)
MCHC: 32.3 g/dL (ref 30.0–36.0)
MCV: 88.8 fL (ref 80.0–100.0)
Monocytes Absolute: 0.6 10*3/uL (ref 0.1–1.0)
Monocytes Relative: 9 %
Neutro Abs: 4 10*3/uL (ref 1.7–7.7)
Neutrophils Relative %: 60 %
Platelets: 295 10*3/uL (ref 150–400)
RBC: 5.16 MIL/uL — ABNORMAL HIGH (ref 3.87–5.11)
RDW: 13.4 % (ref 11.5–15.5)
WBC: 6.8 10*3/uL (ref 4.0–10.5)
nRBC: 0 % (ref 0.0–0.2)

## 2021-04-21 LAB — COMPREHENSIVE METABOLIC PANEL
ALT: 32 U/L (ref 0–44)
AST: 39 U/L (ref 15–41)
Albumin: 4.2 g/dL (ref 3.5–5.0)
Alkaline Phosphatase: 108 U/L (ref 38–126)
Anion gap: 7 (ref 5–15)
BUN: 8 mg/dL (ref 8–23)
CO2: 29 mmol/L (ref 22–32)
Calcium: 9.3 mg/dL (ref 8.9–10.3)
Chloride: 104 mmol/L (ref 98–111)
Creatinine, Ser: 0.81 mg/dL (ref 0.44–1.00)
GFR, Estimated: >60 mL/min (ref >60)
Glucose, Bld: 90 mg/dL (ref 70–99)
Potassium: 4.1 mmol/L (ref 3.5–5.1)
Sodium: 140 mmol/L (ref 135–145)
Total Bilirubin: 1.2 mg/dL (ref 0.3–1.2)
Total Protein: 7.4 g/dL (ref 6.5–8.1)

## 2021-04-21 LAB — TROPONIN I (HIGH SENSITIVITY)
Troponin I (High Sensitivity): 11 ng/L (ref <18)
Troponin I (High Sensitivity): 10 ng/L (ref <18)

## 2021-04-21 NOTE — ED Triage Notes (Signed)
Pt went to urgent care for headache  and weakness, fatigue, feeling unsteady on her feet x4 days. Pt sent here for further eval. Physician at urgent care reports slight change to EKG BBB.

## 2021-04-21 NOTE — Discharge Instructions (Addendum)
Due to cardiac history and palpitations and feeling weak with worsening of headache, recommend go to the ER now for further evaluation. Since you are unstable with walking and feel dizzy, will take you by EMS to [facility] ER ASAP.

## 2021-04-21 NOTE — ED Provider Notes (Signed)
Secretary    CSN: [id number] Arrival date & time: 04/21/21  1130      History   Chief Complaint Chief Complaint  Patient presents with  . Headache  . Fatigue    HPI Autumn Fitzgerald is a 75 y.o. female.   74 year old female presents with fatigue, weakness, palpitations that have become worse this morning. Took her pulse at home and her heart rate was 100 and she indicated her pulse "has never been that high before". Currently 96 here. Also complaining of frontal headache that has become worse over the past 4 days. Has taken Tylenol with no relief and headache has never been as severe as today. Feels very unsteady on her feet. Headache and weakness worse with sitting up or standing, better when lying down. Has felt nauseous but no vomiting or diarrhea. No fever. Feels short of breath. No cough. Has significant cardiac history and is followed by [facility] and history of left bundle branch block and cardiomyopathy. Also history of asthma, anxiety and headache- her PCP is trying to get approval for CT Scan of head and she does not have an appointment yet. Currently on Atenolol, Demadex, Vytorin, Synthroid, Dexilant, Klonopin, Flovent and Flonase daily. Also takes Albuterol prn.   The history is provided by the patient.    Past Medical History:  Diagnosis Date  . Arthritis   . Depression   . GERD (gastroesophageal reflux disease)   . Glaucoma, both eyes   . Head injury, closed, without LOC 02/2013   did not LOC per patient  . Headache   . History of syncope 2014   near syncope,  loop recorder placed and explanted 2017  . HLD (hyperlipidemia)   . Hypertension    "at times"    . 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 Hypothyroidism    followed by pcp  . LBBB (left bundle branch block)   . Mild persistent allergic asthma   . Palpitations   . Panic attacks   . PONV (postoperative nausea and vomiting)   . Pre-diabetes   . Wears glasses      Patient Active Problem List   Diagnosis Date Noted  . Viral URI with cough 10/09/2017  . Encounter for loop recorder at end of battery life 08/02/2016  . New onset of headaches after age 63 09/30/2015  . Dizziness 09/30/2015  . Vision loss, bilateral 09/30/2015  . Lichen sclerosus et atrophicus 06/18/2014  . Benign gastric polyp 02/20/2014  . Degenerative joint disease involving multiple joints 09/10/2013  . Hypertrophic obstructive cardiomyopathy ([place]) 07/23/2013  . LBBB (left bundle branch block) 07/23/2013  . Subclinical hypothyroidism 05/14/2012  . Lower abdominal pain 01/10/2012  . Panic attacks   . GERD (gastroesophageal reflux disease)   . HLD (hyperlipidemia)   . Mild persistent asthma   . HTN (hypertension)   . Palpitations   . Allergic rhinitis 02/24/2011  . Anxiety, generalized 02/24/2011  . 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 CARDIAC SURGERY  02/05/2007   in [place]  for HOCM  . CATARACT EXTRACTION W/ INTRAOCULAR LENS  IMPLANT, BILATERAL  right 07-23-2017;  left 10-15-2017  . COLONOSCOPY N/A 11/24/2013   Procedure: COLONOSCOPY;  Surgeon: Juanita Craver, MD;  Location: [facility];  Service: Endoscopy;  Laterality: N/A;  . DOPPLER ECHOCARDIOGRAPHY  03/14/2013   EF 65-70%,trivial AI,mild-mod. MR,Mod. TR,mod. concentric hypertrophy  .  EP IMPLANTABLE DEVICE N/A 08/22/2016   Procedure: Loop Recorder Removal;  Surgeon: Sanda Klein, MD;  Location: [facility];  Service: Cardiovascular;  Laterality: N/A;  . ESOPHAGOGASTRODUODENOSCOPY N/A 11/24/2013   Procedure: ESOPHAGOGASTRODUODENOSCOPY (EGD);  Surgeon: Juanita Craver, MD;  Location: [facility];  Service: Endoscopy;  Laterality: N/A;  . GLAUCOMA SURGERY Bilateral 2019   "release pressure  . KNEE ARTHROSCOPY Right 10/17/2013   Procedure: ARTHROSCOPY RIGHT KNEE;  Surgeon: [name], MD;  Location: [facility];   Service: Orthopedics;  Laterality: Right;  . LAPAROSCOPIC CHOLECYSTECTOMY  02-03-2010   dr d blackman  @[email or web address]   . LOOP RECORDER IMPLANT  03/18/2013  . LOOP RECORDER IMPLANT N/A 03/18/2013   Procedure: LOOP RECORDER IMPLANT;  Surgeon: Sanda Klein, MD;  Location: [facility];  Service: Cardiovascular;  Laterality: N/A;  . SHOULDER ARTHROSCOPY WITH ROTATOR CUFF REPAIR Right 01/16/2019   Procedure: Right shoulder arthroscopy with subacromial decompression, distal clavicle resection, rotator cuff repair, bicep tenotomy;  Surgeon: Justice Britain, MD;  Location: [facility];  Service: Orthopedics;  Laterality: Right;  . TUBAL LIGATION  yrs ago    OB History    Gravida  5   Para  5   Term  5   Preterm      AB      Living  4     SAB      IAB      Ectopic      Multiple      Live Births               Home Medications    Prior to Admission medications   Medication Sig Start Date End Date Taking? Authorizing Provider  acetaminophen (TYLENOL) 500 MG tablet Take 500 mg by mouth every 6 (six) hours as needed for moderate pain or headache.     [provider]  albuterol (PROAIR HFA) 108 (90 Base) MCG/ACT inhaler Inhale 2 puffs into the lungs every 4 (four) hours as needed for wheezing or shortness of breath. 01/04/21   Kozlow, Donnamarie Poag, MD  atenolol (TENORMIN) 25 MG tablet Take 0.5 tablets (12.5 mg total) by mouth daily. 12/03/19   Croitoru, Dani Gobble, MD  benzonatate (TESSALON) 100 MG capsule Take 1 capsule (100 mg total) by mouth every 8 (eight) hours as needed for cough. 07/01/19   Kozlow, Donnamarie Poag, MD  clobetasol ointment (TEMOVATE) 0.05 % Apply to affected area every night for 4 weeks, then every other day for 4 weeks and then twice a week for 4 weeks or until resolution. 02/07/21   Constant, Peggy, MD  clonazePAM (KLONOPIN) 1 MG tablet Take 2 mg by mouth at bedtime.     [provider]  clotrimazole-betamethasone (LOTRISONE) cream Apply to affected areas twice daily for 7-10  days Patient not taking: Reported on 01/04/2021 07/01/19   Jiles Prows, MD  dexlansoprazole (DEXILANT) 60 MG capsule Take 1 capsule (60 mg total) by mouth daily. 01/04/21   Kozlow, Donnamarie Poag, MD  ezetimibe-simvastatin (VYTORIN) 10-40 MG per tablet Take 1 tablet by mouth every evening.     [provider]  famotidine (PEPCID) 40 MG tablet Take 1 tablet (40 mg total) by mouth daily. 01/04/21   Kozlow, Donnamarie Poag, MD  famotidine (PEPCID) 40 MG tablet Take 1 tablet daily in the afternoon 01/04/21   Kozlow, Donnamarie Poag, MD  FLOVENT HFA 44 MCG/ACT inhaler INHALE TWO PUFFS BY MOUTH TWICE A DAY WITH SPACER 02/21/21   Kozlow, Donnamarie Poag, MD  fluticasone (FLONASE) 50 MCG/ACT nasal spray Place 1 spray into both nostrils daily. 01/04/21   Kozlow, Alvira Philips, MD  levothyroxine (SYNTHROID) 50 MCG tablet Take 50 mcg by mouth daily.  06/15/14   [provider]  loratadine (CLARITIN) 10 MG tablet Take one tablet in the morning and one tablet in the evening as needed. Patient not taking: Reported on 02/07/2021 01/04/21   Jessica Priest, MD  Polyethyl Glycol-Propyl Glycol 0.4-0.3 % SOLN Place one drop in both eyes daily as needed 01/04/21   Kozlow, Alvira Philips, MD  torsemide (DEMADEX) 5 MG tablet Take 5 mg by mouth daily.    [provider]  XIIDRA 5 % SOLN Place 1 drop into both eyes 2 (two) times daily.  08/07/18   [provider]    Family History Family History  Problem Relation Age of Onset  . Cancer Mother        breast  . Hypertension Mother   . Breast cancer Mother   . Cancer Father        prostate  . Cancer Brother   . Diabetes Sister   . Heart attack Sister   . Migraines Neg Hx     Social History Social History   Tobacco Use  . Smoking status: Never Smoker  . Smokeless tobacco: Never Used  Vaping Use  . Vaping Use: Never used  Substance Use Topics  . Alcohol use: No  . Drug use: No     Allergies   Amoxicillin-pot clavulanate, Temazepam, and Lipitor [atorvastatin  calcium]   Review of Systems Review of Systems  Constitutional: Positive for activity change, appetite change and fatigue. Negative for chills, diaphoresis and fever.  HENT: Negative for congestion, ear discharge, ear pain, facial swelling, mouth sores, nosebleeds, postnasal drip, rhinorrhea, sinus pressure, sinus pain, sore throat and trouble swallowing.   Eyes: Positive for photophobia and visual disturbance. Negative for pain and redness.  Respiratory: Positive for shortness of breath. Negative for cough, chest tightness and wheezing.   Cardiovascular: Positive for palpitations. Negative for chest pain.  Gastrointestinal: Positive for nausea. Negative for diarrhea and vomiting.  Musculoskeletal: Negative for neck pain and neck stiffness.  Skin: Negative for color change and rash.  Allergic/Immunologic: Negative for food allergies.  Neurological: Positive for dizziness, weakness, light-headedness and headaches. Negative for seizures, syncope, facial asymmetry, speech difficulty and numbness.  Hematological: Negative for adenopathy. Does not bruise/bleed easily.  Psychiatric/Behavioral: The patient is nervous/anxious.      Physical Exam Triage Vital Signs ED Triage Vitals  Enc Vitals Group     BP 04/21/21 1221 120/75     Pulse Rate 04/21/21 1221 96     Resp 04/21/21 1221 17     Temp 04/21/21 1221 98.3 F (36.8 C)     Temp Source 04/21/21 1221 Oral     SpO2 04/21/21 1221 99 %     Weight --      Height --      Head Circumference --      Peak Flow --      Pain Score 04/21/21 1219 8     Pain Loc --      Pain Edu? --      Excl. in GC? --    No data found.  Updated Vital Signs BP 120/75 (BP Location: Right Arm)   Pulse 96   Temp 98.3 F (36.8 C) (Oral)   Resp 17   SpO2 99%   Visual Acuity Right Eye Distance:  Left Eye Distance:   Bilateral Distance:    Right Eye Near:   Left Eye Near:    Bilateral Near:     Physical Exam Vitals and nursing note reviewed.   Constitutional:      General: She is awake. She is not in acute distress.    Appearance: She is well-developed and well-groomed.     Comments: Patient is sitting on the exam chair in no acute distress but appears tired and is unsteady when changing positions.   HENT:     Head: Normocephalic and atraumatic.     Right Ear: Hearing, tympanic membrane, ear canal and external ear normal.     Left Ear: Hearing, tympanic membrane, ear canal and external ear normal.     Nose: Nose normal.     Right Sinus: No maxillary sinus tenderness or frontal sinus tenderness.     Left Sinus: No maxillary sinus tenderness or frontal sinus tenderness.     Mouth/Throat:     Lips: Pink.     Mouth: Mucous membranes are moist.     Pharynx: Oropharynx is clear. Uvula midline.  Eyes:     Extraocular Movements: Extraocular movements intact.     Conjunctiva/sclera: Conjunctivae normal.  Cardiovascular:     Rate and Rhythm: Normal rate. Rhythm irregular.     Comments: Heart rate is irregular and approximately 98.  Pulmonary:     Effort: Pulmonary effort is normal. No respiratory distress.     Breath sounds: Normal air entry. Examination of the right-upper field reveals decreased breath sounds. Examination of the left-upper field reveals decreased breath sounds. Examination of the right-middle field reveals decreased breath sounds. Examination of the right-lower field reveals decreased breath sounds. Examination of the left-lower field reveals decreased breath sounds. Decreased breath sounds present. No wheezing, rhonchi or rales.  Musculoskeletal:     Cervical back: Normal range of motion and neck supple. No rigidity or tenderness.  Lymphadenopathy:     Cervical: No cervical adenopathy.  Skin:    General: Skin is warm and dry.     Capillary Refill: Capillary refill takes less than 2 seconds.  Neurological:     General: No focal deficit present.     Mental Status: She is alert and oriented to person, place, and  time.     Motor: No tremor or seizure activity.     Gait: Gait abnormal.     Comments: Patient unstable and grabs chair to steady herself when moving out off of the exam chair to other chair in room.   Psychiatric:        Attention and Perception: Attention normal.        Mood and Affect: Mood is anxious.        Speech: Speech normal.        Behavior: Behavior is cooperative.        Thought Content: Thought content normal.        Judgment: Judgment normal.      UC Treatments / Results  Labs (all labs ordered are listed, but only abnormal results are displayed) Labs Reviewed - No data to display  EKG   Radiology No results found.  Procedures ED EKG  Date/Time: 04/21/2021 2:12 PM Performed by: Katy Apo, NP Authorized by: Katy Apo, NP   ECG reviewed by ED Physician in the absence of a cardiologist: no   Previous ECG:    Previous ECG:  Compared to current   Similarity:  Changes noted  Comparison ECG info:  Elevation in ST segment Interpretation:    Interpretation: abnormal   Rate:    ECG rate:  96   ECG rate assessment: normal   Ectopy:    Ectopy: none   ST segments:    ST segments:  Elevation   Elevation:  V1 and V2 Comments:     Reviewed ECG with Merrie Roof, PA who confirms changes in ST segments and concern over other abnormalities. Due to cardiac history and current symptoms, will send patient to ER for further evaluation.    (including critical care time)  Medications Ordered in UC Medications - No data to display  Initial Impression / Assessment and Plan / UC Course  I have reviewed the triage vital signs and the nursing notes.  Pertinent labs & imaging results that were available during my care of the patient were reviewed by me and considered in my medical decision making (see chart for details).     Reviewed ECG results with patient- ST wave abnormality that appears slightly different than previous ECG 6 months ago. Since she feels  unsteady, lightheaded with headache and palpitations along with her cardiac history, recommend patient be seen at the ER ASAP for further evaluation. Called EMS and patient will go by EMS since unsteady walking and feels dizzy. Her son is with her. Patient in no acute distress. Patient will be taken to [facility] ER for further evaluation.   Final Clinical Impressions(s) / UC Diagnoses   Final diagnoses:  Palpitations  Frontal headache  Weakness generalized  Other fatigue  Dizziness on standing     Discharge Instructions     Due to cardiac history and palpitations and feeling weak with worsening of headache, recommend go to the ER now for further evaluation. Since you are unstable with walking and feel dizzy, will take you by EMS to [facility] ER ASAP.     ED Prescriptions    None     PDMP not reviewed this encounter.   Katy Apo, NP 04/21/21 1433

## 2021-04-21 NOTE — ED Triage Notes (Signed)
Pt c/o headache x 4 days. Pt states she has been feeling fatigue. She states she took her blood pressure this morning and states her blood pressure 129/77 and HR was 100. She states she has been lying down and had palpitations this morning.

## 2021-04-21 NOTE — ED Provider Notes (Signed)
Emergency Medicine Provider Triage Evaluation Note  Autumn Fitzgerald , a 75 y.o. female  was evaluated in triage.  Pt complains of headache, generalized weakness and palpitations for the last several days. She had an ekg done at urgent care and she had a new bundle branch block so was sent here for further eval. Denies chest pain, but she does report sob. Denies vomiting, diarrhea, urinary sxs. Denies unilateral numbness   Review of Systems  Positive: Headache, generalized weakness, palpitations Negative: Vomiting, diarrhea, urinary sxs  Physical Exam  BP 138/83 (BP Location: Left Arm)   Pulse 88   Temp 98 F (36.7 C) (Oral)   Resp 18   SpO2 98%  Gen:   Awake, no distress   Resp:  Normal effort  MSK:   Moves extremities without difficulty  Other:  Cranial nerves II-XII intact, 5/5 strength to the bue/ble   Medical Decision Making  Medically screening exam initiated at 3:09 PM.  Appropriate orders placed.  [name] was informed that the remainder of the evaluation will be completed by another provider, this initial triage assessment does not replace that evaluation, and the importance of remaining in the ED until their evaluation is complete.     Rodney Booze, PA-C 04/21/21 [place], DO 04/21/21 1536

## 2021-04-21 NOTE — ED Notes (Signed)
Patient is being discharged from the Urgent Care and sent to the Emergency Department via EMS . Per ANN, patient is in need of higher level of care due to abnormal ekg changes, headache, fatigue. Patient is aware and verbalizes understanding of plan of care.  Vitals:   04/21/21 1221  BP: 120/75  Pulse: 96  Resp: 17  Temp: 98.3 F (36.8 C)  SpO2: 99%

## 2021-04-21 NOTE — ED Notes (Signed)
Called 911 to report pt needing EMERGENCY transport.

## 2021-04-22 ENCOUNTER — Encounter (HOSPITAL_COMMUNITY): Payer: Self-pay | Admitting: Emergency Medicine

## 2021-04-22 DIAGNOSIS — D329 Benign neoplasm of meninges, unspecified: Secondary | ICD-10-CM | POA: Diagnosis not present

## 2021-04-22 LAB — URINALYSIS, ROUTINE W REFLEX MICROSCOPIC
Bilirubin Urine: NEGATIVE
Glucose, UA: NEGATIVE mg/dL
Hgb urine dipstick: NEGATIVE
Ketones, ur: NEGATIVE mg/dL
Nitrite: NEGATIVE
Protein, ur: NEGATIVE mg/dL
Specific Gravity, Urine: 1.004 — ABNORMAL LOW (ref 1.005–1.030)
pH: 6 (ref 5.0–8.0)

## 2021-04-22 MED ORDER — SODIUM CHLORIDE 0.9 % IV BOLUS
500.0000 mL | Freq: Once | INTRAVENOUS | Status: AC
  Administered 2021-04-22: 500 mL via INTRAVENOUS

## 2021-04-22 MED ORDER — SODIUM CHLORIDE 0.9 % IV SOLN
1.0000 g | Freq: Once | INTRAVENOUS | Status: AC
  Administered 2021-04-22: 1 g via INTRAVENOUS
  Filled 2021-04-22: qty 10

## 2021-04-22 MED ORDER — KETOROLAC TROMETHAMINE 30 MG/ML IJ SOLN
15.0000 mg | Freq: Once | INTRAMUSCULAR | Status: AC
  Administered 2021-04-22: 15 mg via INTRAVENOUS
  Filled 2021-04-22: qty 1

## 2021-04-22 MED ORDER — SULFAMETHOXAZOLE-TRIMETHOPRIM 800-160 MG PO TABS: 1.0000 | ORAL_TABLET | Freq: Two times a day (BID) | ORAL | 0 refills | Status: AC

## 2021-04-22 NOTE — ED Provider Notes (Addendum)
[facility] EMERGENCY DEPARTMENT Provider Note   CSN: [id number] Arrival date & time: 04/21/21  1500     History Chief Complaint  Patient presents with  . Headache    Autumn Fitzgerald is a 75 y.o. female.  The history is provided by the patient.  Headache Pain location:  Frontal Quality:  Dull Radiates to:  Does not radiate Severity currently:  7/10 Severity at highest:  7/10 Onset quality:  Gradual Duration:  5 days Timing:  Constant Progression:  Unchanged Chronicity:  Recurrent Context: not loud noise and not straining   Relieved by:  Nothing Worsened by:  Nothing Ineffective treatments:  None tried Associated symptoms: no abdominal pain, no back pain, no blurred vision, no congestion, no cough, no diarrhea, no dizziness, no drainage, no ear pain, no eye pain, no facial pain, no fatigue, no fever, no focal weakness, no hearing loss, no loss of balance, no myalgias, no nausea, no near-syncope, no neck pain, no neck stiffness, no numbness, no paresthesias, no photophobia, no seizures, no sinus pressure, no sore throat, no swollen glands, no syncope, no tingling, no URI, no visual change, no vomiting and no weakness   Associated symptoms comment:  Fatigue, but refuses covid swab as she doesn't go out  Risk factors: no anger   Patient with known meningioma and incomplete LBBB presents for fatigue and frontal headache.  No CP, no SOB, no DOE, no n/v/d.  No f/c/r.  No weakness, no numbness, no changes in vision or speech.  Was sent here because urgent care thought LBBB was new.      Past Medical History:  Diagnosis Date  . Arthritis   . Depression   . GERD (gastroesophageal reflux disease)   . Glaucoma, both eyes   . Head injury, closed, without LOC 02/2013   did not LOC per patient  . Headache   . History of syncope 2014   near syncope,  loop recorder placed and explanted 2017  . HLD (hyperlipidemia)   . Hypertension    "at times"    . 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 Hypothyroidism    followed by pcp  . LBBB (left bundle branch block)   . Mild persistent allergic asthma   . Palpitations   . Panic attacks   . PONV (postoperative nausea and vomiting)   . Pre-diabetes   . Wears glasses     Patient Active Problem List   Diagnosis Date Noted  . Viral URI with cough 10/09/2017  . Encounter for loop recorder at end of battery life 08/02/2016  . New onset of headaches after age 62 09/30/2015  . Dizziness 09/30/2015  . Vision loss, bilateral 09/30/2015  . Lichen sclerosus et atrophicus 06/18/2014  . Benign gastric polyp 02/20/2014  . Degenerative joint disease involving multiple joints 09/10/2013  . Hypertrophic obstructive cardiomyopathy ([place]) 07/23/2013  . LBBB (left bundle branch block) 07/23/2013  . Subclinical hypothyroidism 05/14/2012  . Lower abdominal pain 01/10/2012  . Panic attacks   . GERD (gastroesophageal reflux disease)   . HLD (hyperlipidemia)   . Mild persistent asthma   . HTN (hypertension)   . Palpitations   . Allergic rhinitis 02/24/2011  . Anxiety, generalized 02/24/2011  . 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 CARDIAC SURGERY  02/05/2007   in [place]  for HOCM  . CATARACT EXTRACTION W/ INTRAOCULAR LENS  IMPLANT, BILATERAL  right 07-23-2017;  left 10-15-2017  . COLONOSCOPY N/A 11/24/2013   Procedure: COLONOSCOPY;  Surgeon: Juanita Craver, MD;  Location: [facility];  Service: Endoscopy;  Laterality: N/A;  . DOPPLER ECHOCARDIOGRAPHY  03/14/2013   EF 65-70%,trivial AI,mild-mod. MR,Mod. TR,mod. concentric hypertrophy  . EP IMPLANTABLE DEVICE N/A 08/22/2016   Procedure: Loop Recorder Removal;  Surgeon: Sanda Klein, MD;  Location: [facility];  Service: Cardiovascular;  Laterality: N/A;  . ESOPHAGOGASTRODUODENOSCOPY N/A 11/24/2013   Procedure: ESOPHAGOGASTRODUODENOSCOPY (EGD);   Surgeon: Juanita Craver, MD;  Location: [facility];  Service: Endoscopy;  Laterality: N/A;  . GLAUCOMA SURGERY Bilateral 2019   "release pressure  . KNEE ARTHROSCOPY Right 10/17/2013   Procedure: ARTHROSCOPY RIGHT KNEE;  Surgeon: [name], MD;  Location: [facility];  Service: Orthopedics;  Laterality: Right;  . LAPAROSCOPIC CHOLECYSTECTOMY  02-03-2010   dr d blackman  @[facility]   . LOOP RECORDER IMPLANT  03/18/2013  . LOOP RECORDER IMPLANT N/A 03/18/2013   Procedure: LOOP RECORDER IMPLANT;  Surgeon: Sanda Klein, MD;  Location: [facility];  Service: Cardiovascular;  Laterality: N/A;  . SHOULDER ARTHROSCOPY WITH ROTATOR CUFF REPAIR Right 01/16/2019   Procedure: Right shoulder arthroscopy with subacromial decompression, distal clavicle resection, rotator cuff repair, bicep tenotomy;  Surgeon: Justice Britain, MD;  Location: [facility];  Service: Orthopedics;  Laterality: Right;  . TUBAL LIGATION  yrs ago     OB History    Gravida  5   Para  5   Term  5   Preterm      AB      Living  4     SAB      IAB      Ectopic      Multiple      Live Births              Family History  Problem Relation Age of Onset  . Cancer Mother        breast  . Hypertension Mother   . Breast cancer Mother   . Cancer Father        prostate  . Cancer Brother   . Diabetes Sister   . Heart attack Sister   . Migraines Neg Hx     Social History   Tobacco Use  . Smoking status: Never Smoker  . Smokeless tobacco: Never Used  Vaping Use  . Vaping Use: Never used  Substance Use Topics  . Alcohol use: No  . Drug use: No    Home Medications Prior to Admission medications   Medication Sig Start Date End Date Taking? Authorizing Provider  acetaminophen (TYLENOL) 500 MG tablet Take 500 mg by mouth every 6 (six) hours as needed for moderate pain or headache.     [provider]  albuterol (PROAIR HFA) 108 (90 Base) MCG/ACT inhaler Inhale 2 puffs into the lungs every 4 (four) hours as needed  for wheezing or shortness of breath. 01/04/21   Kozlow, Donnamarie Poag, MD  atenolol (TENORMIN) 25 MG tablet Take 0.5 tablets (12.5 mg total) by mouth daily. 12/03/19   Croitoru, Dani Gobble, MD  benzonatate (TESSALON) 100 MG capsule Take 1 capsule (100 mg total) by mouth every 8 (eight) hours as needed for cough. 07/01/19   Kozlow, Donnamarie Poag, MD  clobetasol ointment (TEMOVATE) 0.05 % Apply to affected area every night for 4 weeks, then every other day for 4 weeks and then twice a week for 4 weeks or until resolution. 02/07/21   Constant, Vickii Chafe, MD  clonazePAM (KLONOPIN) 1 MG tablet Take 2 mg by mouth at bedtime.     [provider]  clotrimazole-betamethasone (LOTRISONE) cream Apply to affected areas twice daily for 7-10 days Patient not taking: Reported on 01/04/2021 07/01/19   Jiles Prows, MD  dexlansoprazole (DEXILANT) 60 MG capsule Take 1 capsule (60 mg total) by mouth daily. 01/04/21   Kozlow, Donnamarie Poag, MD  ezetimibe-simvastatin (VYTORIN) 10-40 MG per tablet Take 1 tablet by mouth every evening.     [provider]  famotidine (PEPCID) 40 MG tablet Take 1 tablet (40 mg total) by mouth daily. 01/04/21   Kozlow, Donnamarie Poag, MD  famotidine (PEPCID) 40 MG tablet Take 1 tablet daily in the afternoon 01/04/21   Kozlow, Donnamarie Poag, MD  FLOVENT HFA 44 MCG/ACT inhaler INHALE TWO PUFFS BY MOUTH TWICE A DAY WITH SPACER 02/21/21   Kozlow, Donnamarie Poag, MD  fluticasone (FLONASE) 50 MCG/ACT nasal spray Place 1 spray into both nostrils daily. 01/04/21   Kozlow, Donnamarie Poag, MD  levothyroxine (SYNTHROID) 50 MCG tablet Take 50 mcg by mouth daily.  06/15/14   [provider]  loratadine (CLARITIN) 10 MG tablet Take one tablet in the morning and one tablet in the evening as needed. Patient not taking: Reported on 02/07/2021 01/04/21   Jiles Prows, MD  Polyethyl Glycol-Propyl Glycol 0.4-0.3 % SOLN Place one drop in both eyes daily as needed 01/04/21   Kozlow, Donnamarie Poag, MD  torsemide (DEMADEX) 5 MG tablet Take 5 mg by mouth daily.     [provider]  XIIDRA 5 % SOLN Place 1 drop into both eyes 2 (two) times daily.  08/07/18   [provider]    Allergies    Amoxicillin-pot clavulanate, Temazepam, and Lipitor [atorvastatin calcium]  Review of Systems   Review of Systems  Constitutional: Negative for diaphoresis, fatigue and fever.  HENT: Negative for congestion, ear pain, hearing loss, postnasal drip, sinus pressure and sore throat.   Eyes: Negative for blurred vision, photophobia and pain.  Respiratory: Negative for cough and shortness of breath.   Cardiovascular: Negative for chest pain, leg swelling, syncope and near-syncope.  Gastrointestinal: Negative for abdominal pain, diarrhea, nausea and vomiting.  Genitourinary: Negative for difficulty urinating.  Musculoskeletal: Negative for back pain, myalgias, neck pain and neck stiffness.  Skin: Negative for rash.  Neurological: Positive for headaches. Negative for dizziness, tremors, focal weakness, seizures, syncope, facial asymmetry, speech difficulty, weakness, numbness, paresthesias and loss of balance.  Psychiatric/Behavioral: Negative for agitation and confusion.    Physical Exam Updated Vital Signs BP (!) 156/85 (BP Location: Right Arm)   Pulse 77   Temp 98 F (36.7 C) (Oral)   Resp 16   Ht 5\' 1"  (1.549 m)   Wt 63.5 kg   SpO2 100%   BMI 26.45 kg/m   Physical Exam Vitals and nursing note reviewed.  Constitutional:      General: She is not in acute distress.    Appearance: Normal appearance. She is not diaphoretic.  HENT:     Head: Normocephalic and atraumatic.     Nose: Nose normal.     Mouth/Throat:     Mouth: Mucous membranes are moist.     Pharynx: Oropharynx is clear.  Eyes:     Extraocular Movements: Extraocular movements intact.     Conjunctiva/sclera: Conjunctivae normal.     Pupils: Pupils are equal, round, and reactive to light.  Cardiovascular:     Rate and Rhythm: Normal rate and regular  rhythm.     Pulses:  Normal pulses.     Heart sounds: Normal heart sounds.  Pulmonary:     Effort: Pulmonary effort is normal.     Breath sounds: Normal breath sounds.  Abdominal:     General: Abdomen is flat. Bowel sounds are normal.     Palpations: Abdomen is soft.     Tenderness: There is no abdominal tenderness. There is no guarding.  Musculoskeletal:        General: No tenderness. Normal range of motion.     Cervical back: Normal range of motion and neck supple.     Right lower leg: No edema.     Left lower leg: No edema.  Skin:    General: Skin is warm and dry.  Neurological:     General: No focal deficit present.     Mental Status: She is alert.     Cranial Nerves: No cranial nerve deficit.     Deep Tendon Reflexes: Reflexes normal.  Psychiatric:        Mood and Affect: Mood is anxious.     ED Results / Procedures / Treatments   Labs (all labs ordered are listed, but only abnormal results are displayed) Results for orders placed or performed during the hospital encounter of 04/21/21  Comprehensive metabolic panel  Result Value Ref Range   Sodium 140 135 - 145 mmol/L   Potassium 4.1 3.5 - 5.1 mmol/L   Chloride 104 98 - 111 mmol/L   CO2 29 22 - 32 mmol/L   Glucose, Bld 90 70 - 99 mg/dL   BUN 8 8 - 23 mg/dL   Creatinine, Ser 0.81 0.44 - 1.00 mg/dL   Calcium 9.3 8.9 - 10.3 mg/dL   Total Protein 7.4 6.5 - 8.1 g/dL   Albumin 4.2 3.5 - 5.0 g/dL   AST 39 15 - 41 U/L   ALT 32 0 - 44 U/L   Alkaline Phosphatase 108 38 - 126 U/L   Total Bilirubin 1.2 0.3 - 1.2 mg/dL   GFR, Estimated >60 >60 mL/min   Anion gap 7 5 - 15  CBC with Differential  Result Value Ref Range   WBC 6.8 4.0 - 10.5 K/uL   RBC 5.16 (H) 3.87 - 5.11 MIL/uL   Hemoglobin 14.8 12.0 - 15.0 g/dL   HCT 45.8 36.0 - 46.0 %   MCV 88.8 80.0 - 100.0 fL   MCH 28.7 26.0 - 34.0 pg   MCHC 32.3 30.0 - 36.0 g/dL   RDW 13.4 11.5 - 15.5 %   Platelets 295 150 - 400 K/uL   nRBC 0.0 0.0 - 0.2 %   Neutrophils Relative % 60 %   Neutro Abs  4.0 1.7 - 7.7 K/uL   Lymphocytes Relative 30 %   Lymphs Abs 2.1 0.7 - 4.0 K/uL   Monocytes Relative 9 %   Monocytes Absolute 0.6 0.1 - 1.0 K/uL   Eosinophils Relative 0 %   Eosinophils Absolute 0.0 0.0 - 0.5 K/uL   Basophils Relative 1 %   Basophils Absolute 0.1 0.0 - 0.1 K/uL   Immature Granulocytes 0 %   Abs Immature Granulocytes 0.02 0.00 - 0.07 K/uL  Troponin I (High Sensitivity)  Result Value Ref Range   Troponin I (High Sensitivity) 11 <18 ng/L  Troponin I (High Sensitivity)  Result Value Ref Range   Troponin I (High Sensitivity) 10 <18 ng/L   CT Head Wo Contrast  Result Date: 04/21/2021 CLINICAL DATA:  Headache, chronic, new  features or increased frequency History of meningioma. EXAM: CT HEAD WITHOUT CONTRAST TECHNIQUE: Contiguous axial images were obtained from the base of the skull through the vertex without intravenous contrast. COMPARISON:  Most recent head CT 09/23/2013 FINDINGS: Brain: 7 mm extra-axial calcified mass right frontal region abutting the falx is not significantly changed from prior exams. No associated mass effect. No underlying edema. No hemorrhage, acute infarct, hydrocephalus, or subdural collection. No midline shift. Vascular: Atherosclerosis of skullbase vasculature without hyperdense vessel or abnormal calcification. Skull: No fracture or discrete skull lesion. Stable appearance of the right frontal calvarium overlying the meningioma. Sinuses/Orbits: Paranasal sinuses and mastoid air cells are clear. The visualized orbits are unremarkable. Bilateral cataract resection. Other: None. IMPRESSION: 1. No acute intracranial abnormality. 2. Unchanged 7 mm calcified right frontal meningioma. No associated mass effect or edema. Electronically Signed   By: Keith Rake M.D.   On: 04/21/2021 16:14    EKG EKG Interpretation  Date/Time:  Thursday Apr 21 2021 15:08:00 EDT Ventricular Rate:  91 PR Interval:  136 QRS Duration: 120 QT Interval:  388 QTC  Calculation: 477 R Axis:   79 Text Interpretation: Sinus rhythm with occasional Premature ventricular complexes Incomplete left bundle branch block Confirmed by Randal Buba, Kanyon Seibold ([id number]) on 04/21/2021 11:08:50 PM   Radiology CT Head Wo Contrast  Result Date: 04/21/2021 CLINICAL DATA:  Headache, chronic, new features or increased frequency History of meningioma. EXAM: CT HEAD WITHOUT CONTRAST TECHNIQUE: Contiguous axial images were obtained from the base of the skull through the vertex without intravenous contrast. COMPARISON:  Most recent head CT 09/23/2013 FINDINGS: Brain: 7 mm extra-axial calcified mass right frontal region abutting the falx is not significantly changed from prior exams. No associated mass effect. No underlying edema. No hemorrhage, acute infarct, hydrocephalus, or subdural collection. No midline shift. Vascular: Atherosclerosis of skullbase vasculature without hyperdense vessel or abnormal calcification. Skull: No fracture or discrete skull lesion. Stable appearance of the right frontal calvarium overlying the meningioma. Sinuses/Orbits: Paranasal sinuses and mastoid air cells are clear. The visualized orbits are unremarkable. Bilateral cataract resection. Other: None. IMPRESSION: 1. No acute intracranial abnormality. 2. Unchanged 7 mm calcified right frontal meningioma. No associated mass effect or edema. Electronically Signed   By: Keith Rake M.D.   On: 04/21/2021 16:14    Procedures Procedures   Medications Ordered in ED Medications  ketorolac (TORADOL) 30 MG/ML injection 15 mg (has no administration in time range)  sodium chloride 0.9 % bolus 500 mL (has no administration in time range)    ED Course  I have reviewed the triage vital signs and the nursing notes.  Pertinent labs & imaging results that were available during my care of the patient were reviewed by me and considered in my medical decision making (see chart for details).    Meningioma and incomplete LBBB  are both seen on previous imaging and EKGs, patient has ruled out for MI in the ED.  Patient was offered covid testing as this can cause these symptoms but patient has refused.  Patient has been treated for headache and will be referred to NEUROSURGERY and to cardiology.  Fatigue is likely secondary to UTI.  Will treat with medication that patient is not allergic to, recommend close follow up with patient's PMD for recheck.  No signs of sepsis in the ED.    Autumn Fitzgerald was evaluated in Emergency Department on 04/22/2021 for the symptoms described in the history of present illness. She was evaluated in the context of  the global COVID-19 pandemic, which necessitated consideration that the patient might be at risk for infection with the SARS-CoV-2 virus that causes COVID-19. Institutional protocols and algorithms that pertain to the evaluation of patients at risk for COVID-19 are in a state of rapid change based on information released by regulatory bodies including the CDC and federal and state organizations. These policies and algorithms were followed during the patient's care in the ED.  Final Clinical Impression(s) / ED Diagnoses Final diagnoses:  Left bundle branch block  Meningioma ([place])     Return for intractable cough, coughing up blood, fevers >100.4 unrelieved by medication, shortness of breath, intractable vomiting, chest pain, shortness of breath, weakness, numbness, changes in speech, facial asymmetry, abdominal pain, passing out, Inability to tolerate liquids or food, cough, altered mental status or any concerns. No signs of systemic illness or infection. The patient is nontoxic-appearing on exam and vital signs are within normal limits.  I have reviewed the triage vital signs and the nursing notes. Pertinent labs & imaging results that were available during my care of the patient were reviewed by me and considered in my medical decision making (see chart for details). After history, exam,  and medical workup I feel the patient has been appropriately medically screened and is safe for discharge home. Pertinent diagnoses were discussed with the patient. Patient was given return precautions.   Susan Arana, MD 04/22/21 [place], Carri Spillers, MD 04/22/21 [id number]

## 2021-04-25 LAB — URINE CULTURE: Culture: 60000 — AB

## 2021-04-26 ENCOUNTER — Telehealth: Payer: Self-pay | Admitting: Emergency Medicine

## 2021-04-26 NOTE — Telephone Encounter (Signed)
Post ED Visit - Positive Culture Follow-up  Culture report reviewed by antimicrobial stewardship pharmacist: [facility] Team []  Elenor Quinones, Pharm.D. []  Heide Guile, Pharm.D., BCPS AQ-ID []  Parks Neptune, Pharm.D., BCPS []  Alycia Rossetti, Pharm.D., BCPS []  Porter, Pharm.D., BCPS, AAHIVP []  Legrand Como, Pharm.D., BCPS, AAHIVP []  Salome Arnt, PharmD, BCPS []  Johnnette Gourd, PharmD, BCPS []  Hughes Better, PharmD, BCPS []  Leeroy Cha, PharmD []  Laqueta Linden, PharmD, BCPS []  Albertina Parr, PharmD  [place] Team []  Leodis Sias, PharmD []  Lindell Spar, PharmD []  Royetta Asal, PharmD []  Graylin Shiver, Rph []  Rema Fendt) Glennon Mac, PharmD []  Arlyn Dunning, PharmD []  Netta Cedars, PharmD []  Dia Sitter, PharmD []  Leone Haven, PharmD []  Gretta Arab, PharmD []  Theodis Shove, PharmD []  Peggyann Juba, PharmD []  Reuel Boom, PharmD   Positive urine culture Treated with sulfamethoxazole-trimethoprim, organism sensitive to the same and no further patient follow-up is required at this time.  Hazle Nordmann 04/26/2021, 10:29 AM

## 2021-05-06 ENCOUNTER — Other Ambulatory Visit: Payer: Self-pay | Admitting: Internal Medicine

## 2021-05-07 LAB — URINE CULTURE
MICRO NUMBER:: 11943926
SPECIMEN QUALITY:: ADEQUATE

## 2021-05-08 ENCOUNTER — Other Ambulatory Visit: Payer: Self-pay | Admitting: Allergy and Immunology

## 2021-05-19 ENCOUNTER — Ambulatory Visit (INDEPENDENT_AMBULATORY_CARE_PROVIDER_SITE_OTHER): Payer: Medicare (Managed Care) | Admitting: Cardiovascular Disease

## 2021-05-19 ENCOUNTER — Other Ambulatory Visit: Payer: Self-pay

## 2021-05-19 ENCOUNTER — Encounter: Payer: Self-pay | Admitting: Cardiovascular Disease

## 2021-05-19 VITALS — BP 124/70 | HR 91 | Ht 60.0 in | Wt 136.0 lb

## 2021-05-19 DIAGNOSIS — I10 Essential (primary) hypertension: Secondary | ICD-10-CM | POA: Diagnosis not present

## 2021-05-19 DIAGNOSIS — I421 Obstructive hypertrophic cardiomyopathy: Secondary | ICD-10-CM | POA: Diagnosis not present

## 2021-05-19 DIAGNOSIS — I447 Left bundle-branch block, unspecified: Secondary | ICD-10-CM | POA: Diagnosis not present

## 2021-05-19 DIAGNOSIS — E039 Hypothyroidism, unspecified: Secondary | ICD-10-CM

## 2021-05-19 DIAGNOSIS — E782 Mixed hyperlipidemia: Secondary | ICD-10-CM

## 2021-05-19 NOTE — Patient Instructions (Signed)
Medication Instructions:  No changes *If you need a refill on your cardiac medications before your next appointment, please call your pharmacy*   Lab Work: None ordered If you have labs (blood work) drawn today and your tests are completely normal, you will receive your results only by: [place] (if you have MyChart) OR A paper copy in the mail If you have any lab test that is abnormal or we need to change your treatment, we will call you to review the results.   Testing/Procedures: Your physician has requested that you have an echocardiogram. Echocardiography is a painless test that uses sound waves to create images of your heart. It provides your doctor with information about the size and shape of your heart and how well your heart's chambers and valves are working. You may receive an ultrasound enhancing agent through an IV if needed to better visualize your heart during the echo.This procedure takes approximately one hour. There are no restrictions for this procedure. This will take place at the [address].   Follow-Up: At [facility], you and your health needs are our priority.  As part of our continuing mission to provide you with exceptional heart care, we have created designated Provider Care Teams.  These Care Teams include your primary Cardiologist (physician) and Advanced Practice Providers (APPs -  Physician Assistants and Nurse Practitioners) who all work together to provide you with the care you need, when you need it.  We recommend signing up for the patient portal called "MyChart".  Sign up information is provided on this After Visit Summary.  MyChart is used to connect with patients for Virtual Visits (Telemedicine).  Patients are able to view lab/test results, encounter notes, upcoming appointments, etc.  Non-urgent messages can be sent to your provider as well.   To learn more about what you can do with MyChart, go to [email or web address].     Your next appointment:   12 month(s)  The format for your next appointment:   In Person  Provider:   You may see Sanda Klein, MD or one of the following Advanced Practice Providers on your designated Care Team:   Almyra Deforest, PA-C Fabian Sharp, PA-C or  Roby Lofts, [place]

## 2021-05-19 NOTE — Progress Notes (Signed)
Cardiology Office Note:    Date:  05/19/2021   ID:  Autumn Fitzgerald, DOB 1946/03/29, MRN [id number]  PCP:  Nolene Ebbs, MD  Cardiologist:  Sanda Klein, MD  Electrophysiologist:  None   Referring MD: Nolene Ebbs, MD   No chief complaint on file.   History of Present Illness:    Autumn Fitzgerald is a 75 y.o. female with a hx of HOCM s/p septal myomectomy, GERD, HTN, HLD, history of near syncope s/p loop recorder that was explanted in 2017, hypothyroidism, LBBB and prediabetes.  Previous loop recorder did not show any meaningful arrhythmia. Patient had a normal Myoview in January 2020 as part of the cardiac clearance prior to surgery.   Echocardiogram obtained on 09/10/2019 showed EF 60 to 65%, moderately increased left ventricular posterior wall thickness and septal wall thickness, elevated LVEDP.   She is generally doing well, but as always is very anxious. Terrified of COVID and of her own heart condition.  NYHA functional class II exertional dyspnea.  She was seen at urgent care with complaints of a frontal dull headache and fatigue.  ECG showed left bundle branch block and since they did not know this was an old finding, they sent her to the emergency room where she had waited for 12 hours and became very upset.  Her blood pressure was high.  Work-up was otherwise benign.  She was referred to neurosurgery for her meningioma, which is not a new finding either.  UTI was diagnosed as a cause for her fatigue and she was given antibiotics.  Tells me that she also has some chest discomfort off and on during that visit, this is not a new complaint for her.  She has occasional palpitations, not associated with presyncope.  Most of the time she has done well.  The patient specifically denies any chest pain with exertion, dyspnea at rest or with exertion, orthopnea, paroxysmal nocturnal dyspnea, syncope, focal neurological deficits, intermittent claudication, lower extremity edema, unexplained  weight gain, cough, hemoptysis or wheezing.  Hemoglobin A1c is borderline at 6.1%, without medications for diabetes.  Her triglycerides are high around 300.  Her lipid parameters are acceptable on Vytorin.  She is generally very sensitive to low doses of meds (including beta blockers and diuretics). Although she could not tolerate a full 25 mg daily dose of atenolol due to dizziness and weakness, she feels better on 12.5 mg of atenolol.  It helps prevent palpitations and limits episodes of chest pain.  Had dyspnea off diuretics, but taking torsemide more than 5 mg daily causes weakness.  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)    Hypothyroidism    followed by pcp   LBBB (left bundle branch block)    Mild persistent allergic asthma    Palpitations    Panic attacks    PONV (postoperative nausea and vomiting)    Pre-diabetes    Wears glasses     Past Surgical History:  Procedure Laterality Date   CARDIAC SURGERY  02/05/2007   in [place]  Myectomy  for HOCM   CATARACT EXTRACTION W/ INTRAOCULAR LENS  IMPLANT, BILATERAL  right 07-23-2017;  left 10-15-2017   COLONOSCOPY N/A 11/24/2013   Procedure: COLONOSCOPY;  Surgeon: Juanita Craver, MD;  Location: [facility];  Service: Endoscopy;  Laterality: N/A;   DOPPLER ECHOCARDIOGRAPHY  03/14/2013   EF 65-70%,trivial AI,mild-mod. MR,Mod. TR,mod. concentric hypertrophy   EP IMPLANTABLE DEVICE N/A 08/22/2016   Procedure: Loop Recorder Removal;  Surgeon: Sanda Klein, MD;  Location: [facility];  Service: Cardiovascular;  Laterality: N/A;   ESOPHAGOGASTRODUODENOSCOPY N/A  11/24/2013   Procedure: ESOPHAGOGASTRODUODENOSCOPY (EGD);  Surgeon: Juanita Craver, MD;  Location: [facility];  Service: Endoscopy;  Laterality: N/A;   GLAUCOMA SURGERY Bilateral 2019   "release pressure   KNEE ARTHROSCOPY Right 10/17/2013   Procedure: ARTHROSCOPY RIGHT KNEE;  Surgeon: [name], MD;  Location: [facility];  Service: Orthopedics;  Laterality: Right;   LAPAROSCOPIC CHOLECYSTECTOMY  02-03-2010   dr d blackman  @[facility]    LOOP RECORDER IMPLANT  03/18/2013   LOOP RECORDER IMPLANT N/A 03/18/2013   Procedure: LOOP RECORDER IMPLANT;  Surgeon: Sanda Klein, MD;  Location: [facility];  Service: Cardiovascular;  Laterality: N/A;   SHOULDER ARTHROSCOPY WITH ROTATOR CUFF REPAIR Right 01/16/2019   Procedure: Right shoulder arthroscopy with subacromial decompression, distal clavicle resection, rotator cuff repair, bicep tenotomy;  Surgeon: Justice Britain, MD;  Location: [facility];  Service: Orthopedics;  Laterality: Right;   TUBAL LIGATION  yrs ago    Current Medications: Current Meds  Medication Sig   acetaminophen (TYLENOL) 500 MG tablet Take 500 mg by mouth every 6 (six) hours as needed for moderate pain or headache.    albuterol (VENTOLIN HFA) 108 (90 Base) MCG/ACT inhaler INHALE 2 PUFFS INTO THE LUNGS EVERY 4 HOURS AS NEEDED FOR WHEEZING OR SHORTNESS OF BREATH   atenolol (TENORMIN) 25 MG tablet Take 0.5 tablets (12.5 mg total) by mouth daily.   benzonatate (TESSALON) 100 MG capsule Take 1 capsule (100 mg total) by mouth every 8 (eight) hours as needed for cough.   clobetasol ointment (TEMOVATE) 0.05 % Apply to affected area every night for 4 weeks, then every other day for 4 weeks and then twice a week for 4 weeks or until resolution.   clonazePAM (KLONOPIN) 1 MG tablet Take 2 mg by mouth at bedtime.    clotrimazole-betamethasone (LOTRISONE) cream Apply to affected areas twice daily for 7-10 days   DEXILANT 30 MG capsule    ezetimibe-simvastatin (VYTORIN) 10-40 MG per tablet Take 1 tablet by  mouth every evening.    famotidine (PEPCID) 40 MG tablet Take 1 tablet (40 mg total) by mouth daily.   FLOVENT HFA 44 MCG/ACT inhaler INHALE TWO PUFFS BY MOUTH TWICE A DAY WITH SPACER   fluticasone (FLONASE) 50 MCG/ACT nasal spray Place 1 spray into both nostrils daily.   levothyroxine (SYNTHROID) 50 MCG tablet Take 50 mcg by mouth daily.    levothyroxine (SYNTHROID) 50 MCG tablet Take 1 tablet by mouth daily.   loratadine (CLARITIN) 10 MG tablet Take one tablet in the morning and one tablet in the evening as needed.   Polyethyl Glycol-Propyl Glycol 0.4-0.3 % SOLN Place one drop in both eyes daily as needed   torsemide (DEMADEX) 5 MG tablet Take 5 mg by mouth daily.   XIIDRA 5 % SOLN Place 1 drop into both eyes 2 (two) times daily.      Allergies:   Amoxicillin-pot clavulanate, Temazepam, Lipitor [atorvastatin calcium], and Penicillin g   Social  History   Socioeconomic History   Marital status: Divorced    Spouse name: Not on file   Number of children: 5   Years of education: 10   Highest education level: Not on file  Occupational History   Not on file  Tobacco Use   Smoking status: Never   Smokeless tobacco: Never  Vaping Use   Vaping Use: Never used  Substance and Sexual Activity   Alcohol use: No   Drug use: No   Sexual activity: Never    Birth control/protection: Post-menopausal, Surgical    Comment: divorced  Other Topics Concern   Not on file  Social History Narrative   Lives at home by herself.    Caffeine use: none    Social Determinants of Radio broadcast assistant Strain: Not on file  Food Insecurity: Not on file  Transportation Needs: Not on file  Physical Activity: Not on file  Stress: Not on file  Social Connections: Not on file     Family History: The patient's family history includes Breast cancer in her mother; Cancer in her brother, father, and mother; Diabetes in her sister; Heart attack in her sister; Hypertension in her mother. There is no  history of Migraines.  ROS:   Please see the history of present illness.    All other systems are reviewed and are negative.   EKGs/Labs/Other Studies Reviewed:    The following studies were reviewed today:  Myoview 12/24/2018 The left ventricular ejection fraction is hyperdynamic (>65%). Nuclear stress EF: 80%. There was no ST segment deviation noted during stress. The study is normal. This is a low risk study.   Normal pharmacologic nuclear stress test with no evidence for prior infarct or ischemia. Normal LVEF.   Echo 09/10/2019 IMPRESSIONS      1. Left ventricular ejection fraction, by visual estimation, is 60 to 65%. The left ventricle has normal function. Normal left ventricular size. Left ventricular septal wall thickness was moderately increased. Moderately increased left ventricular  posterior wall thickness. There is moderately increased left ventricular hypertrophy.  2. Elevated left ventricular end-diastolic pressure.  3. Hypertrophic cardiomyopathy.  4. Left ventricular diastolic Doppler parameters are consistent with impaired relaxation pattern of LV diastolic filling.  5. Mid-cavitary gradient noted with a peak velocity of 1.5 m/s, peak gradient 9 mmHg at rest. Gradient increases to 20 mmHg with Valsalva (2.22m/s). There is no significant systolic anterior motion of the mitral valve.  6. Global right ventricle has normal systolic function.The right ventricular size is normal. No increase in right ventricular wall thickness.  7. Left atrial size was normal.  8. Right atrial size was normal.  9. The mitral valve is normal in structure. Trace mitral valve regurgitation. No evidence of mitral stenosis. 10. The tricuspid valve is normal in structure. Tricuspid valve regurgitation is mild. 11. The aortic valve is tricuspid Aortic valve regurgitation was not visualized by color flow Doppler. Structurally normal aortic valve, with no evidence of sclerosis or stenosis. 12. The  pulmonic valve was normal in structure. Pulmonic valve regurgitation is trivial by color flow Doppler. 13. Normal pulmonary artery systolic pressure. 14. The inferior vena cava is normal in size with greater than 50% respiratory variability, suggesting right atrial pressure of 3 mmHg.  EKG:  EKG is not ordered today.  Personally reviewed the tracing from May 12 shows sinus rhythm left bundle branch block.  Block has been documented at least since 2012.  Recent Labs: 01/03/2021: TSH 3.92 04/21/2021: ALT 32;  BUN 8; Creatinine, Ser 0.81; Hemoglobin 14.8; Platelets 295; Potassium 4.1; Sodium 140  Recent Lipid Panel    Component Value Date/Time   CHOL 190 01/03/2021 0231   TRIG 305 (H) 01/03/2021 0231   HDL 52 01/03/2021 0231   CHOLHDL 3.7 01/03/2021 0231   VLDL 19 09/23/2008 2026   LDLCALC 97 01/03/2021 0231    Physical Exam:    VS:  BP 124/70   Pulse 91   Ht 5' (1.524 m)   Wt 136 lb (61.7 kg)   SpO2 97%   BMI 26.56 kg/m     Wt Readings from Last 3 Encounters:  05/19/21 136 lb (61.7 kg)  04/22/21 140 lb (63.5 kg)  02/07/21 138 lb (62.6 kg)      General: Alert, oriented x3, no distress, appears well.  Healed sternotomy scar. Head: no evidence of trauma, PERRL, EOMI, no exophtalmos or lid lag, no myxedema, no xanthelasma; normal ears, nose and oropharynx Neck: normal jugular venous pulsations and no hepatojugular reflux; brisk carotid pulses without delay and no carotid bruits Chest: clear to auscultation, no signs of consolidation by percussion or palpation, normal fremitus, symmetrical and full respiratory excursions Cardiovascular: normal position and quality of the apical impulse, regular rhythm, normal first and second heart sounds, no murmurs, rubs or gallops Abdomen: no tenderness or distention, no masses by palpation, no abnormal pulsatility or arterial bruits, normal bowel sounds, no hepatosplenomegaly Extremities: no clubbing, cyanosis or edema; 2+ radial, ulnar and  brachial pulses bilaterally; 2+ right femoral, posterior tibial and dorsalis pedis pulses; 2+ left femoral, posterior tibial and dorsalis pedis pulses; no subclavian or femoral bruits Neurological: grossly nonfocal Psych: Normal mood and affect    ASSESSMENT:    1. HOCM (hypertrophic obstructive cardiomyopathy) ([place])   2. LBBB (left bundle branch block)   3. Essential hypertension   4. Mixed hyperlipidemia   5. Acquired hypothyroidism     PLAN:    In order of problems listed above:  HOCM s/p myomectomy: As before, this is mostly asymptomatic.  She has occasional chest discomfort when she becomes emotional and this improves with beta-blocker.  Unfortunate she cannot tolerate higher daily doses of beta-blocker.  Okay to take an extra 12.5 mg of atenolol as needed.  No evidence of residual LV outflow tract obstruction on echo, and no murmur heard on physical exam today either at rest or with a Valsalva maneuver.  But it is conceivable she could have some subendocardial ischemia due to LVH.  No evidence of ischemia on recent nuclear stress testing.  Never had documentation of malignant ventricular arrhythmia.  We will schedule a follow-up echocardiogram to look for any signs of elevated left heart filling pressures. LBBB: Longstanding abnormality. Hypertension: Well-controlled Hyperlipidemia: Continue statin.  No known CAD.  Target LDL less than 100 is appropriate.  Lipids were checked by Dr. Jeanie Cooks, will request a copy. Hypothyroidism: On Synthroid, managed by primary care provider. Clinically euthyroid.  TSH was normal in January of this year.    Medication Adjustments/Labs and Tests Ordered: Current medicines are reviewed at length with the patient today.  Concerns regarding medicines are outlined above.  Orders Placed This Encounter  Procedures   ECHOCARDIOGRAM COMPLETE    No orders of the defined types were placed in this encounter.   Patient Instructions  Medication  Instructions:  No changes *If you need a refill on your cardiac medications before your next appointment, please call your pharmacy*   Lab Work: None ordered If you have labs (  blood work) drawn today and your tests are completely normal, you will receive your results only by: [place] (if you have MyChart) OR A paper copy in the mail If you have any lab test that is abnormal or we need to change your treatment, we will call you to review the results.   Testing/Procedures: Your physician has requested that you have an echocardiogram. Echocardiography is a painless test that uses sound waves to create images of your heart. It provides your doctor with information about the size and shape of your heart and how well your heart's chambers and valves are working. You may receive an ultrasound enhancing agent through an IV if needed to better visualize your heart during the echo.This procedure takes approximately one hour. There are no restrictions for this procedure. This will take place at the [address], [address].   Follow-Up: At [facility], you and your health needs are our priority.  As part of our continuing mission to provide you with exceptional heart care, we have created designated Provider Care Teams.  These Care Teams include your primary Cardiologist (physician) and Advanced Practice Providers (APPs -  Physician Assistants and Nurse Practitioners) who all work together to provide you with the care you need, when you need it.  We recommend signing up for the patient portal called "MyChart".  Sign up information is provided on this After Visit Summary.  MyChart is used to connect with patients for Virtual Visits (Telemedicine).  Patients are able to view lab/test results, encounter notes, upcoming appointments, etc.  Non-urgent messages can be sent to your provider as well.   To learn more about what you can do with MyChart, go to [email or web address].    Your next  appointment:   12 month(s)  The format for your next appointment:   In Person  Provider:   You may see Sanda Klein, MD or one of the following Advanced Practice Providers on your designated Care Team:   Almyra Deforest, PA-C Fabian Sharp, [place] or  Roby Lofts, PA-C   Signed, Sanda Klein, MD  05/19/2021 1:57 PM    [place]

## 2021-06-01 ENCOUNTER — Other Ambulatory Visit: Payer: Self-pay | Admitting: Allergy and Immunology

## 2021-06-18 ENCOUNTER — Other Ambulatory Visit: Payer: Self-pay | Admitting: Allergy and Immunology

## 2021-06-20 ENCOUNTER — Other Ambulatory Visit: Payer: Self-pay

## 2021-06-20 ENCOUNTER — Ambulatory Visit (HOSPITAL_COMMUNITY): Payer: Medicare (Managed Care) | Attending: Cardiovascular Disease

## 2021-06-20 DIAGNOSIS — I421 Obstructive hypertrophic cardiomyopathy: Secondary | ICD-10-CM | POA: Diagnosis present

## 2021-06-20 LAB — ECHOCARDIOGRAM COMPLETE
Area-P 1/2: 3.72 cm2
S' Lateral: 2.4 cm

## 2021-06-20 MED ORDER — PERFLUTREN LIPID MICROSPHERE
1.0000 mL | INTRAVENOUS | Status: AC | PRN
  Administered 2021-06-20: 2 mL via INTRAVENOUS

## 2021-06-22 ENCOUNTER — Telehealth: Payer: Self-pay | Admitting: Cardiovascular Disease

## 2021-06-22 NOTE — Telephone Encounter (Signed)
Left message for patient that MD reviewed message and report is OK

## 2021-06-22 NOTE — Telephone Encounter (Signed)
Patient called in to say that when you lays down she feel dizzness. Her body is very weak and has paint in forehead advide

## 2021-06-22 NOTE — Telephone Encounter (Signed)
Returned call to patient of Dr. Loletha Grayer She reports dizziness when she lays down (main concern), weakness, pain in her forehead She reports this has been going on for 2 months  On 7/11 she had echo - she had dizziness when lying down for test -- reviewed results with patient  -- she said her son scared her to death regarding report, told her she could have a heart attack, told her she could die from her condition -- explained to patient that her cardiac condition is not new, her echo report appears reassuring and that we cannot predict a heart attack or cause of death based on her condition(s) or test results  She does not check her BP but states her "BP is fine"  Advised patient it may be difficult to advise on cause of her present symptoms of dizziness given that we don't have much info to go off of. Explained it could be more neuro-mediated   Will send to MD to see if he has advice

## 2021-06-28 ENCOUNTER — Telehealth: Payer: Self-pay | Admitting: Allergy and Immunology

## 2021-06-28 MED ORDER — FLUTICASONE PROPIONATE HFA 44 MCG/ACT IN AERO: INHALATION_SPRAY | RESPIRATORY_TRACT | 0 refills | Status: DC

## 2021-06-28 NOTE — Telephone Encounter (Signed)
Patient would like a refill on Flovent until her appointment with Dr. Neldon Mc on 07/05/2021.   Uses [facility] [address].  Please advise.

## 2021-06-28 NOTE — Telephone Encounter (Signed)
Sent in 1 curtesy refill

## 2021-07-05 ENCOUNTER — Other Ambulatory Visit: Payer: Self-pay

## 2021-07-05 ENCOUNTER — Ambulatory Visit (INDEPENDENT_AMBULATORY_CARE_PROVIDER_SITE_OTHER): Payer: Medicare (Managed Care) | Admitting: Allergy and Immunology

## 2021-07-05 ENCOUNTER — Encounter: Payer: Self-pay | Admitting: Allergy and Immunology

## 2021-07-05 VITALS — BP 120/74 | HR 103 | Temp 98.1°F | Resp 16 | Ht 60.0 in | Wt 134.4 lb

## 2021-07-05 DIAGNOSIS — K219 Gastro-esophageal reflux disease without esophagitis: Secondary | ICD-10-CM | POA: Diagnosis not present

## 2021-07-05 DIAGNOSIS — L299 Pruritus, unspecified: Secondary | ICD-10-CM | POA: Diagnosis not present

## 2021-07-05 DIAGNOSIS — J4540 Moderate persistent asthma, uncomplicated: Secondary | ICD-10-CM

## 2021-07-05 DIAGNOSIS — H9201 Otalgia, right ear: Secondary | ICD-10-CM

## 2021-07-05 DIAGNOSIS — J3089 Other allergic rhinitis: Secondary | ICD-10-CM | POA: Diagnosis not present

## 2021-07-05 DIAGNOSIS — I421 Obstructive hypertrophic cardiomyopathy: Secondary | ICD-10-CM

## 2021-07-05 MED ORDER — FAMOTIDINE 40 MG PO TABS: 40.0000 mg | ORAL_TABLET | Freq: Every day | ORAL | 5 refills | Status: DC

## 2021-07-05 MED ORDER — FLUTICASONE PROPIONATE HFA 44 MCG/ACT IN AERO: INHALATION_SPRAY | RESPIRATORY_TRACT | 5 refills | Status: DC

## 2021-07-05 MED ORDER — LORATADINE 10 MG PO TABS: ORAL_TABLET | ORAL | 5 refills | Status: DC

## 2021-07-05 NOTE — Patient Instructions (Addendum)
  1.  Continue Flovent 44 two inhalations 1 time a day    2.  Continue Flonase one spray each nostril 1-2 times per day    3.  Continue Dexilant 60 mg in a.m.  Can add Famotidine 40 mg in PM if needed  4.  Continue nasal saline and systane eye drops if needed.  5.  Continue albuterol HFA 2 puffs every 4-6 hours if needed.  6. Can add loratadine 10 mg - 1-2 tablet 1-2 times per day (40 mg MAX / day) for itching.    7. Return to clinic in 6 months or earlier if problem  8. Obtain fall flu vaccine  9. Visit with ENT if ear issue continues

## 2021-07-05 NOTE — Progress Notes (Signed)
[facility] [place] [facility] [place]   Follow-up Note   Referring Provider: Nolene Ebbs, MD Primary Provider: Nolene Ebbs, MD Date of Office Visit: 07/05/2021  Subjective:   Autumn Fitzgerald (DOB: 06/22/1946) is a 75 y.o. female who returns to the [facility] on 07/05/2021 in re-evaluation of the following:  HPI: Ritika returns to this clinic in evaluation of asthma and allergic rhinitis and LPR and a history of intermittent pruritic disorder and hives.  Her last visit to this clinic was 04 January 2021.  She has lots of things that are going on recently.  She apparently had a rather significant headache that developed in June 2022 and went to the emergency room and a CT scan was performed which identified a meningioma which had been previously identified from 2016 and has not grown in size.  She went to see a neurosurgeon concerning this issue and apparently there is no long-term plan for this issue.  As well, she has been having this "sharp pain" that sometimes gives rise to a weakness across her body.  She has seen her cardiologist and has had a echocardiogram which apparently is okay.  Overall her breathing is doing relatively well and she does not use a short acting bronchodilator very often.  She is not consistent about using her Flovent and she probably averages out to just 1 or 2 times per day.  Likewise, her nose is doing relatively well while using some nasal steroid intermittently.  Her reflux has been under very good control at this point time ever since she started eating beets on a regular basis and she continues on Dexilant.  She has no need to use any famotidine.  She has developed some type of right sided ear issue that "is a sound but is not just a sound" over the course of the past month without any hearing loss and without any vertigo.  She has not been having any pruritus or urticaria.  Allergies as of 07/05/2021        Reactions   Amoxicillin-pot Clavulanate Other (See Comments)   Severe pains and GI symptoms Did it involve swelling of the face/tongue/throat, SOB, or low BP? No Did it involve sudden or severe rash/hives, skin peeling, or any reaction on the inside of your mouth or nose? No Did you need to seek medical attention at a hospital or doctor's office? No When did it last happen?      within the past 10 years If all above answers are "NO", may proceed with cephalosporin use.   Temazepam Other (See Comments)   Dizziness and doesn't work for patient   Lipitor [atorvastatin Calcium]    Muscle pain   Penicillin G    Other reaction(s): Unknown        Medication List    acetaminophen 500 MG tablet Commonly known as: TYLENOL Take 500 mg by mouth every 6 (six) hours as needed for moderate pain or headache.   albuterol 108 (90 Base) MCG/ACT inhaler Commonly known as: VENTOLIN HFA INHALE TWO PUFFS BY MOUTH INTO LUNGS EVERY 4 HOURS AS NEEDED FOR WHEEZING/SHORTNESS OF BREATH   atenolol 25 MG tablet Commonly known as: TENORMIN Take 0.5 tablets (12.5 mg total) by mouth daily.   benzonatate 100 MG capsule Commonly known as: TESSALON Take 1 capsule (100 mg total) by mouth every 8 (eight) hours as needed for cough.   clonazePAM 1 MG tablet Commonly known as: KLONOPIN Take 2 mg  by mouth at bedtime.   Dexilant 30 MG capsule Generic drug: Dexlansoprazole   ezetimibe-simvastatin 10-40 MG tablet Commonly known as: VYTORIN Take 1 tablet by mouth every evening.   famotidine 40 MG tablet Commonly known as: PEPCID Take 1 tablet (40 mg total) by mouth daily.   fluticasone 44 MCG/ACT inhaler Commonly known as: Flovent HFA INHALE TWO PUFFS BY MOUTH TWICE A DAY WITH SPACER   fluticasone 50 MCG/ACT nasal spray Commonly known as: Flonase Place 1 spray into both nostrils daily.   levothyroxine 50 MCG tablet Commonly known as: SYNTHROID Take 50 mcg by mouth daily.   loratadine 10 MG  tablet Commonly known as: Claritin Take one tablet in the morning and one tablet in the evening as needed.   Polyethyl Glycol-Propyl Glycol 0.4-0.3 % Soln Place one drop in both eyes daily as needed   torsemide 5 MG tablet Commonly known as: DEMADEX Take 5 mg by mouth daily.   Xiidra 5 % Soln Generic drug: Lifitegrast Place 1 drop into both eyes 2 (two) times daily.        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)    Hypothyroidism    followed by pcp   LBBB (left bundle branch block)    Mild persistent allergic asthma    Palpitations    Panic attacks    PONV (postoperative nausea and vomiting)    Pre-diabetes    Wears glasses     Past Surgical History:  Procedure Laterality Date   CARDIAC SURGERY  02/05/2007   in [place]  for HOCM   CATARACT EXTRACTION W/ INTRAOCULAR LENS  IMPLANT, BILATERAL  right 07-23-2017;  left 10-15-2017   COLONOSCOPY N/A 11/24/2013   Procedure: COLONOSCOPY;  Surgeon: Juanita Craver, MD;  Location: [facility];  Service: Endoscopy;  Laterality: N/A;   DOPPLER ECHOCARDIOGRAPHY  03/14/2013   EF 65-70%,trivial AI,mild-mod. MR,Mod. TR,mod. concentric hypertrophy   EP IMPLANTABLE DEVICE N/A 08/22/2016   Procedure: Loop Recorder Removal;  Surgeon: Sanda Klein, MD;  Location: [facility];  Service: Cardiovascular;  Laterality: N/A;   ESOPHAGOGASTRODUODENOSCOPY N/A 11/24/2013   Procedure: ESOPHAGOGASTRODUODENOSCOPY (EGD);  Surgeon: Juanita Craver, MD;  Location: [facility];  Service: Endoscopy;  Laterality: N/A;   GLAUCOMA SURGERY Bilateral 2019   "release pressure    KNEE ARTHROSCOPY Right 10/17/2013   Procedure: ARTHROSCOPY RIGHT KNEE;  Surgeon: [name], MD;  Location: [facility];  Service: Orthopedics;  Laterality: Right;   LAPAROSCOPIC CHOLECYSTECTOMY  02-03-2010   dr d blackman  '@[facility]'$    LOOP RECORDER IMPLANT  03/18/2013   LOOP RECORDER IMPLANT N/A 03/18/2013   Procedure: LOOP RECORDER IMPLANT;  Surgeon: Sanda Klein, MD;  Location: [facility];  Service: Cardiovascular;  Laterality: N/A;   SHOULDER ARTHROSCOPY WITH ROTATOR CUFF REPAIR Right 01/16/2019   Procedure: Right shoulder arthroscopy with subacromial decompression, distal clavicle resection, rotator cuff repair, bicep tenotomy;  Surgeon: Justice Britain, MD;  Location: [facility];  Service: Orthopedics;  Laterality: Right;   TUBAL LIGATION  yrs ago  Review of systems negative except as noted in HPI / PMHx or noted below:  Review of Systems  Constitutional: Negative.   HENT: Negative.    Eyes: Negative.   Respiratory: Negative.    Cardiovascular: Negative.   Gastrointestinal: Negative.   Genitourinary: Negative.   Musculoskeletal: Negative.   Skin: Negative.   Neurological: Negative.   Endo/Heme/Allergies: Negative.   Psychiatric/Behavioral: Negative.      Objective:   Vitals:   07/05/21 1338  BP: 120/74  Pulse: (!) 103  Resp: 16  Temp: 98.1 F (36.7 C)  SpO2: 97%   Height: 5' (152.4 cm)  Weight: 134 lb 6.4 oz (61 kg)   Physical Exam Constitutional:      Appearance: She is not diaphoretic.  HENT:     Head: Normocephalic.     Right Ear: Tympanic membrane, ear canal and external ear normal.     Left Ear: Tympanic membrane, ear canal and external ear normal.     Nose: Nose normal. No mucosal edema or rhinorrhea.     Mouth/Throat:     Pharynx: Uvula midline. No oropharyngeal exudate.  Eyes:     Conjunctiva/sclera: Conjunctivae normal.  Neck:     Thyroid: No thyromegaly.     Trachea: Trachea normal. No tracheal tenderness or tracheal deviation.  Cardiovascular:     Rate  and Rhythm: Normal rate and regular rhythm.     Heart sounds: Normal heart sounds, S1 normal and S2 normal. No murmur heard. Pulmonary:     Effort: No respiratory distress.     Breath sounds: Normal breath sounds. No stridor. No wheezing or rales.  Lymphadenopathy:     Head:     Right side of head: No tonsillar adenopathy.     Left side of head: No tonsillar adenopathy.     Cervical: No cervical adenopathy.  Skin:    Findings: No erythema or rash.     Nails: There is no clubbing.  Neurological:     Mental Status: She is alert.    Diagnostics:    Spirometry was performed and demonstrated an FEV1 of 1.17 at 66 % of predicted.  Results of an echocardiogram obtained 20 June 2021 identified the following:   1. No evidence of elevated filling pressures HOCM post septal myectomy  Some flow acceleration in mid cavity with peak gradient 18 mmHg sinus rate  86 bpm No mid cavity obliteration on 2D images no SAM Consider increasing  beta blocker. Noted inhalers on  med list but no diagnosis of asthma under PMH . Left ventricular ejection  fraction, by estimation, is 60 to 65%. The left ventricle has normal  function. The left ventricle has no regional wall motion abnormalities.  There is moderate left ventricular  hypertrophy. Left ventricular diastolic parameters are consistent with  Grade I diastolic dysfunction (impaired relaxation).   2. Right ventricular systolic function is normal. The right ventricular  size is normal.   3. The mitral valve is normal in structure. Trivial mitral valve  regurgitation. No evidence of mitral stenosis.   4. Tricuspid valve regurgitation is moderate.   5. The aortic valve is tricuspid. Aortic valve regurgitation is not  visualized. No aortic stenosis is present.   6. The inferior vena cava is normal in size with greater than 50%  respiratory variability, suggesting right atrial pressure of 3 mmHg.   Assessment and Plan:   1. Asthma, moderate  persistent, well-controlled   2. Other allergic rhinitis   3. LPRD (laryngopharyngeal reflux disease)  4. Pruritic disorder   5. Otalgia, right   6. IHSS (idiopathic hypertrophic subaortic stenosis) ([place])      1.  Continue Flovent 44 two inhalations 1 time a day    2.  Continue Flonase one spray each nostril 1-2 times per day    3.  Continue Dexilant 60 mg in a.m.  Can add Famotidine 40 mg in PM if needed  4.  Continue nasal saline and systane eye drops if needed.  5.  Continue albuterol HFA 2 puffs every 4-6 hours if needed.  6. Can add loratadine 10 mg - 1-2 tablet 1-2 times per day (40 mg MAX / day) for itching.    7. Return to clinic in 6 months or earlier if problem  8. Obtain fall flu vaccine  9. Visit with ENT if ear issue continues  Overall Tassie appears to be doing relatively well from an airway standpoint and a reflux standpoint and I have not really changed much of her therapy but I did encourage her to consistently use her Flovent and Flonase and Dexilant as consistently as possible.  She has a bunch of other issues including her otalgia that has developed over the course of the past month and I recommended that she go see an ear nose and throat physician should that otalgia continue.  She has had some heart issues and she can follow-up with cardiology regarding that issue.  I will see her back in this clinic in 6 months or earlier if there is a problem.  Allena Katz, MD [facility]

## 2021-07-06 ENCOUNTER — Encounter: Payer: Self-pay | Admitting: Allergy and Immunology

## 2021-09-08 ENCOUNTER — Other Ambulatory Visit: Payer: Self-pay | Admitting: Allergy and Immunology

## 2021-09-08 NOTE — Telephone Encounter (Signed)
Patient called and needs to have Albuterol inhaler called into [facility] on [address] [phone].

## 2021-09-26 ENCOUNTER — Other Ambulatory Visit: Payer: Self-pay

## 2021-09-26 ENCOUNTER — Encounter: Payer: Self-pay | Admitting: Obstetrics and Gynecology

## 2021-09-26 ENCOUNTER — Ambulatory Visit (INDEPENDENT_AMBULATORY_CARE_PROVIDER_SITE_OTHER): Payer: Medicare (Managed Care) | Admitting: Obstetrics and Gynecology

## 2021-09-26 VITALS — BP 112/71 | HR 84 | Wt 133.0 lb

## 2021-09-26 DIAGNOSIS — N904 Leukoplakia of vulva: Secondary | ICD-10-CM | POA: Diagnosis not present

## 2021-09-26 MED ORDER — CLOBETASOL PROPIONATE 0.05 % EX CREA: 1.0000 "application " | TOPICAL_CREAM | Freq: Two times a day (BID) | CUTANEOUS | 6 refills | Status: DC

## 2021-09-26 NOTE — Progress Notes (Signed)
75 yo P4 returning for follow up on lichen sclerosis. Autumn Fitzgerald reports feeling well overall and has been using the clobetasol cream 3 times a week. She reports some occasional vaginal pruritis. She denies pelvic pain or abnormal discharge. She is not sexually active. 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)    Hypothyroidism    followed by pcp   LBBB (left bundle branch block)    Mild persistent allergic asthma    Palpitations    Panic attacks    PONV (postoperative nausea and vomiting)    Pre-diabetes    Wears glasses    Past Surgical History:  Procedure Laterality Date   CARDIAC SURGERY  02/05/2007   in [place]  for HOCM   CATARACT EXTRACTION W/ INTRAOCULAR LENS  IMPLANT, BILATERAL  right 07-23-2017;  left 10-15-2017   COLONOSCOPY N/A 11/24/2013   Procedure: COLONOSCOPY;  Surgeon: Juanita Craver, MD;  [facility];  Service: Endoscopy;  Laterality: N/A;   DOPPLER ECHOCARDIOGRAPHY  03/14/2013   EF 65-70%,trivial AI,mild-mod. MR,Mod. TR,mod. concentric hypertrophy   EP IMPLANTABLE DEVICE N/A 08/22/2016   Procedure: Loop Recorder Removal;  Surgeon: Sanda Klein, MD;  Location: [facility];  Service: Cardiovascular;  Laterality: N/A;   ESOPHAGOGASTRODUODENOSCOPY N/A 11/24/2013   Procedure: ESOPHAGOGASTRODUODENOSCOPY (EGD);  Surgeon: Juanita Craver, MD;  [facility];  Service: Endoscopy;  Laterality: N/A;   GLAUCOMA SURGERY Bilateral 2019   "release pressure   KNEE ARTHROSCOPY Right 10/17/2013   Procedure: ARTHROSCOPY RIGHT KNEE;  Surgeon: [name], MD;  Location: [facility];  Service: Orthopedics;  Laterality: Right;   LAPAROSCOPIC CHOLECYSTECTOMY  02-03-2010   dr d blackman  @[facility]    LOOP RECORDER IMPLANT  03/18/2013   LOOP RECORDER IMPLANT N/A 03/18/2013   Procedure: LOOP RECORDER IMPLANT;  Surgeon: Sanda Klein, MD;  Location: [facility];  Service: Cardiovascular;  Laterality: N/A;   SHOULDER ARTHROSCOPY WITH ROTATOR CUFF REPAIR Right 01/16/2019   Procedure: Right shoulder arthroscopy with subacromial decompression, distal clavicle resection, rotator cuff repair, bicep tenotomy;  Surgeon: Justice Britain, MD;  [facility];  Service: Orthopedics;  Laterality: Right;   TUBAL LIGATION  yrs ago    Family History  Problem Relation Age of Onset   Cancer Mother        breast   Hypertension Mother    Breast cancer Mother    Cancer Father        prostate   Cancer Brother    Diabetes Sister    Heart attack Sister    Migraines Neg Hx    Social History   Tobacco Use   Smoking status: Never   Smokeless tobacco: Never  Vaping Use   Vaping Use: Never used  Substance Use Topics   Alcohol use: No   Drug use: No    ROS See pertinent in HPI.   Vitals:   09/26/21 1316  BP: 112/71  Pulse: 84   GENERAL: Well-developed, well-nourished female in no acute distress.  BREASTS: Symmetric in size. No palpable masses or lymphadenopathy, skin changes, or nipple drainage. ABDOMEN: Soft, nontender, nondistended. No organomegaly. PELVIC: Normal external female genitalia. Vagina is pale and atrophic. 1 cm area consistent with lichen sclerosis at 12 o'clock and 6 o'clock at the introitus. Normal discharge. Atrophic cervix. Uterus is normal in size. No adnexal mass or tenderness. Chaperone present during the pelvic exam EXTREMITIES: No cyanosis, clubbing, or edema, 2+ distal pulses.  [name] 75 yo with lichen sclerosis - Continue clobetasol cream - Autumn Fitzgerald reports recent normal mammogram - Autumn Fitzgerald up to date on colonoscopy and Dexa scan -  Follow up in 6 months

## 2021-09-26 NOTE — Progress Notes (Deleted)
NEW OB reports no complaints today.  Declined FLU vaccine.

## 2021-10-10 ENCOUNTER — Encounter: Payer: Self-pay | Admitting: Neurology

## 2021-12-16 ENCOUNTER — Ambulatory Visit (INDEPENDENT_AMBULATORY_CARE_PROVIDER_SITE_OTHER): Payer: Medicare HMO | Admitting: Family Medicine

## 2021-12-16 ENCOUNTER — Encounter: Payer: Self-pay | Admitting: Allergy

## 2021-12-16 ENCOUNTER — Other Ambulatory Visit: Payer: Self-pay

## 2021-12-16 VITALS — BP 116/76 | HR 104 | Temp 99.9°F | Resp 18 | Ht 60.0 in | Wt 132.8 lb

## 2021-12-16 DIAGNOSIS — K219 Gastro-esophageal reflux disease without esophagitis: Secondary | ICD-10-CM

## 2021-12-16 DIAGNOSIS — J4540 Moderate persistent asthma, uncomplicated: Secondary | ICD-10-CM | POA: Diagnosis not present

## 2021-12-16 DIAGNOSIS — J310 Chronic rhinitis: Secondary | ICD-10-CM

## 2021-12-16 MED ORDER — LEVOCETIRIZINE DIHYDROCHLORIDE 5 MG PO TABS: 5.0000 mg | ORAL_TABLET | Freq: Every evening | ORAL | 5 refills | Status: DC

## 2021-12-16 NOTE — Patient Instructions (Addendum)
  1.  Begin Flovent 44 two inhalations 1 time a day    2.  Begin Flonase one spray each nostril 2 times per day and begin saline nasal rinses at least once a day  3.  Begin levocetirizine 5 mg once a day as needed for a runny nose  4.  Continue Dexilant 60 mg in a.m.  Begin Famotidine 40 mg in PM if needed  5.  Continue albuterol HFA 2 puffs every 4-6 hours   6. Continue Systane eye drops as needed  7. Call the clinic for worsening symptoms. Return to clinic on 01/10/2021 as scheduled

## 2021-12-16 NOTE — Progress Notes (Signed)
[address] [place] [address] Dept: [phone]  FOLLOW UP NOTE  Patient ID: Autumn Fitzgerald, female    DOB: 01/02/1946  Age: 76 y.o. MRN: [id number] Date of Office Visit: 12/16/2021  Assessment  Chief Complaint: Cough (Since Thursday - benzonatae 100mg  2 capsules every 8 hours prn has not been helping and she uses her flovent inhaler ), Allergic Rhinitis  (Runny nose, watery eyes, dizziness ), and Other (Her sone was sick first and than her granddaughter, because of her health conditions she is not able to take many medications so she came to [place] for help.)  HPI Autumn Fitzgerald is a 76 year old female who presents to the clinic for an evaluation of cough. She was last seen in this clinic on 07/05/2021 by Dr. Neldon Mc for evaluation of asthma, allergic rhinitis, and LPR.  Patient with medical history significant for hypertrophic obstructive cardiomyopathy and meningioma.  At today's visit, she reports that symptoms including clear rhinorrhea and cough began on Thursday.  She denies fever and reports that her son and granddaughter are both sick at this time.  Asthma is reported as poorly controlled with occasional shortness of breath and dry cough occurring in the daytime and nighttime.  She denies wheeze with rest or activity.  She continues Flovent 44-1 puff about 1 to 2 days a week and uses albuterol about 2 days a week with no relief of symptoms.  Allergic rhinitis is reported as poorly controlled with symptoms including clear rhinorrhea, sneezing, nasal congestion and postnasal drainage with frequent throat clearing.  She is not currently using Flonase, nasal saline rinses, or loratadine.  She reports that loratadine is no longer effective in controlling her runny nose.  Chart review indicates negative environmental allergy skin prick testing on 10/17/2017 and negative environmental testing via lab on 01/30/2008.  Allergic conjunctivitis is reported as well controlled with no symptoms.   She currently uses Systane eyedrops for dry eye with relief of symptoms.  She reports tinnitus in her right ear, however, denies ear pain in either ear.  She continues to follow up with [facility] for evaluation of tinnitus.  Reflux is reported as well controlled with no symptoms of heartburn or vomiting.  She continues Dexilant 60 mg daily and famotidine 40 mg daily.  Her current medications are listed in the chart.   Drug Allergies:  Allergies  Allergen Reactions   Amoxicillin-Pot Clavulanate Other (See Comments)    Severe pains and GI symptoms Did it involve swelling of the face/tongue/throat, SOB, or low BP? No Did it involve sudden or severe rash/hives, skin peeling, or any reaction on the inside of your mouth or nose? No Did you need to seek medical attention at a hospital or doctor's office? No When did it last happen?      within the past 10 years If all above answers are "NO", may proceed with cephalosporin use.    Temazepam Other (See Comments)    Dizziness and doesn't work for patient   Lipitor [Atorvastatin Calcium]     Muscle pain   Penicillin G     Other reaction(s): Unknown    Physical Exam: BP 116/76   Pulse (!) 104   Temp 99.9 F (37.7 C)   Resp 18   Ht 5' (1.524 m)   Wt 132 lb 12.8 oz (60.2 kg)   SpO2 98%   BMI 25.94 kg/m    Physical Exam Vitals reviewed.  Constitutional:      Appearance: Normal appearance.  HENT:     Head: Normocephalic and atraumatic.     Right Ear: Tympanic membrane normal.     Left Ear: Tympanic membrane normal.     Nose:     Comments: Bilateral nares slightly erythematous with clear nasal drainage noted.  Pharynx slightly erythematous with no exudate.  Ears normal.  Eyes normal. Eyes:     Conjunctiva/sclera: Conjunctivae normal.  Cardiovascular:     Rate and Rhythm: Normal rate and regular rhythm.     Heart sounds: Normal heart sounds. No murmur heard. Pulmonary:     Effort:  Pulmonary effort is normal.     Breath sounds: Normal breath sounds.     Comments: Lungs clear to auscultation Musculoskeletal:        General: Normal range of motion.     Cervical back: Normal range of motion and neck supple.  Skin:    General: Skin is warm and dry.  Neurological:     Mental Status: She is alert and oriented to person, place, and time.  Psychiatric:        Mood and Affect: Mood normal.        Behavior: Behavior normal.        Thought Content: Thought content normal.        Judgment: Judgment normal.    Diagnostics: FVC 1.32, FEV1 1.11.  Predicted FVC 2.30, predicted FEV1 1.71.  Spirometry indicates possible moderate restriction.  Postbronchodilator therapy FVC 1.29, FEV1 1.13.  Postbronchodilator spirometry indicates no significant improvement, however, patient reports resolution of symptoms.  Assessment and Plan: 1. Not well controlled moderate persistent asthma   2. LPRD (laryngopharyngeal reflux disease)   3. Chronic rhinitis     Meds ordered this encounter  Medications   levocetirizine (XYZAL) 5 MG tablet    Sig: Take 1 tablet (5 mg total) by mouth every evening.    Dispense:  30 tablet    Refill:  5    Patient Instructions    1.  Begin Flovent 44 two inhalations 1 time a day    2.  Begin Flonase one spray each nostril 2 times per day and begin saline nasal rinses at least once a day  3.  Begin levocetirizine 5 mg once a day as needed for a runny nose  4.  Continue Dexilant 60 mg in a.m.  Begin Famotidine 40 mg in PM if needed  5.  Continue albuterol HFA 2 puffs every 4-6 hours   6. Continue Systane eye drops as needed  7. Call the clinic for worsening symptoms. Return to clinic on 01/10/2021 as scheduled    Return in about 25 days (around 01/10/2022), or if symptoms worsen or fail to improve.    Thank you for the opportunity to care for this patient.  Please do not hesitate to contact me with questions.  Gareth Morgan, FNP [facility]

## 2021-12-20 ENCOUNTER — Telehealth: Payer: Self-pay | Admitting: Allergy and Immunology

## 2021-12-20 NOTE — Telephone Encounter (Signed)
Tried calling again and the call would not go through. Will try again tomorrow.

## 2021-12-20 NOTE — Telephone Encounter (Signed)
I tried dialing the patient's number and it said call could not be completed and to try again. I will try calling again later

## 2021-12-20 NOTE — Telephone Encounter (Signed)
Patient called stating she received a mychart message from our office that she needed to call our office and speak to Dr. Neldon Mc as soon as possible. Unfortunately I did not see a mychart message that was sent to her.   Patient is requesting a call back as soon as possible.   Best contact number: [phone]

## 2021-12-22 NOTE — Telephone Encounter (Signed)
Called and spoke with patient and advised that we did not see a MyChart message that contained what she stated but assured her that everything was fine. Patient verbalized understanding and stated that she is feeling better and we will see her at her upcoming appointment.

## 2022-01-06 NOTE — Progress Notes (Signed)
NEUROLOGY CONSULTATION NOTE  Autumn Fitzgerald MRN: [id number] DOB: 01-Sep-1946  Referring provider: Nolene Ebbs, MD Primary care provider: Nolene Ebbs, MD  Reason for consult:  headache  Assessment/Plan:   Chronic migraine without aura, without status migrainosus, not intractable Right sided pulsatile tinnitus.  Thyroid is controlled Cerebral meningioma.  Stable and incidental finding  To evaluate pulsatile tinnitus, check CTA head and neck Migraine prevention:  Start zonisamide titrating to 50mg  daily.  Given her cardiac comorbidities, would avoid antidepressants Migraine rescue:  Tylenol Limit use of pain relievers to no more than 2 days out of week to prevent risk of rebound or medication-overuse headache. Keep headache diary Follow up 6 months    Subjective:  Autumn Fitzgerald is a 76 year old female with CHF, HTN, hyperthyroidism, asthma, HLD, depression and anxiety who presents for headaches.  History supplemented by referring provider's note.  Onset:  2016.  Initially in back of head and was found to have a meningioma.  Frontal headache started in May 2022. Location:  across forehead and behind eyes Quality:  pressure Intensity:  moderate to severe.   Aura:  absent Prodrome:  absent Associated symptoms:  photophobia, phonophobia, not too much nausea anymore.  She denies associated visual disturbance, unilateral numbness or weakness. Duration:  all day.   Frequency:  daily Frequency of abortive medication: Tylenol every night.   Triggers/aggravating factors:  talking on phone may aggravate Relieving factors:  laying down in dark, wears sunglasses Activity:  aggravates Takes a Tylenol everynight.    MRI of brain with and without contrast on 10/15/2015 personally reviewed showed 7 mm calcified meningioma outside the right frontal lobe adjacent to the falx cerebri.  Due to worsening headaches, CT head performed on 04/21/2021 personally reviewed revealed meningioma to be  stable.  She also has tinnitus worse in the right ear. It is a wooshing/throbbing sound. 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  She has hypothyroidism that is treated with Synthroid  Current NSAIDS/analgesics:  Fioricet, acetaminophen Current triptans:  none Current ergotamine:  none Current anti-emetic:  none Current muscle relaxants:  none Current Antihypertensive medications:  atenolol, torsemide Current Antidepressant medications:  none Current Anticonvulsant medications:  none Current anti-CGRP:  none Current Vitamins/Herbal/Supplements:  MVI, D3 Current Antihistamines/Decongestants:  Flonase Other therapy:  none Hormone/birth control:  none Other medications:  Synthroid  Past NSAIDS/analgesics:  tramadol Past abortive triptans:  none Past abortive ergotamine:  none Past muscle relaxants:  cyclobenzaprine, methocarbamol Past anti-emetic:  ondansetron Past antihypertensive medications:  metoprolol tartrate Past antidepressant medications:  citalopram, fluoxetine Past anticonvulsant medications:  topiramate, Depakote Past anti-CGRP:  none Past vitamins/Herbal/Supplements:  B12 Past antihistamines/decongestants:  Xyzal, Claritin Other past therapies:  trigger point injects   PAST MEDICAL HISTORY: 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)    Hypothyroidism    followed by pcp   LBBB (left bundle branch block)    Mild persistent allergic asthma    Palpitations    Panic attacks    PONV (postoperative  nausea and vomiting)    Pre-diabetes    Wears glasses     PAST SURGICAL  HISTORY: Past Surgical History:  Procedure Laterality Date   CARDIAC SURGERY  02/05/2007   in [place]   Septal Myectomy  for HOCM   CATARACT EXTRACTION W/ INTRAOCULAR LENS  IMPLANT, BILATERAL  right 07-23-2017;  left 10-15-2017   COLONOSCOPY N/A 11/24/2013   Procedure: COLONOSCOPY;  Surgeon: Juanita Craver, MD;  [facility];  Service: Endoscopy;  Laterality: N/A;   DOPPLER ECHOCARDIOGRAPHY  03/14/2013   EF 65-70%,trivial AI,mild-mod. MR,Mod. TR,mod. concentric hypertrophy   EP IMPLANTABLE DEVICE N/A 08/22/2016   Procedure: Loop Recorder Removal;  Surgeon: Sanda Klein, MD;  Location: [place] [facility];  Service: Cardiovascular;  Laterality: N/A;   ESOPHAGOGASTRODUODENOSCOPY N/A 11/24/2013   Procedure: ESOPHAGOGASTRODUODENOSCOPY (EGD);  Surgeon: Juanita Craver, MD;  [facility];  Service: Endoscopy;  Laterality: N/A;   GLAUCOMA SURGERY Bilateral 2019   "release pressure   KNEE ARTHROSCOPY Right 10/17/2013   Procedure: ARTHROSCOPY RIGHT KNEE;  Surgeon: [name], MD;  Location: [facility];  Service: Orthopedics;  Laterality: Right;   LAPAROSCOPIC CHOLECYSTECTOMY  02-03-2010   dr d blackman  @[facility]    LOOP RECORDER IMPLANT  03/18/2013   LOOP RECORDER IMPLANT N/A 03/18/2013   Procedure: LOOP RECORDER IMPLANT;  Surgeon: Sanda Klein, MD;  Location: [facility];  Service: Cardiovascular;  Laterality: N/A;   SHOULDER ARTHROSCOPY WITH ROTATOR CUFF REPAIR Right 01/16/2019   Procedure: Right shoulder arthroscopy with subacromial decompression, distal clavicle resection, rotator cuff repair, bicep tenotomy;  Surgeon: Justice Britain, MD;  [facility]: [facility];  Service: Orthopedics;  Laterality: Right;   TUBAL LIGATION  yrs ago    MEDICATIONS: Current Outpatient Medications on File Prior to Visit  Medication Sig Dispense Refill   acetaminophen (TYLENOL) 500 MG tablet Take 500 mg by mouth every 6 (six) hours as needed for moderate pain or headache.      albuterol (VENTOLIN HFA) 108 (90 Base)  MCG/ACT inhaler INHALE 2 PUFFS INTO THE LUNGS EVERY 4 TO 6 HOURS AS NEEDED 18 g 1   atenolol (TENORMIN) 25 MG tablet Take 0.5 tablets (12.5 mg total) by mouth daily. 30 tablet 3   benzonatate (TESSALON) 100 MG capsule Take 1 capsule (100 mg total) by mouth every 8 (eight) hours as needed for cough. 30 capsule 5   clobetasol cream (TEMOVATE) 8.31 % Apply 1 application topically 2 (two) times daily. 30 g 6   clonazePAM (KLONOPIN) 1 MG tablet Take 2 mg by mouth at bedtime.      DEXILANT 30 MG capsule      ezetimibe-simvastatin (VYTORIN) 10-40 MG per tablet Take 1 tablet by mouth every evening.      famotidine (PEPCID) 40 MG tablet Take 1 tablet (40 mg total) by mouth daily. 30 tablet 5   fluticasone (FLONASE) 50 MCG/ACT nasal spray Place 1 spray into both nostrils daily. 16 g 4   fluticasone (FLOVENT HFA) 44 MCG/ACT inhaler INHALE TWO PUFFS BY MOUTH TWICE A DAY WITH SPACER 10.6 g 5   levocetirizine (XYZAL) 5 MG tablet Take 1 tablet (5 mg total) by mouth every evening. 30 tablet 5   levothyroxine (SYNTHROID) 50 MCG tablet Take 50 mcg by mouth daily.      Polyethyl Glycol-Propyl Glycol 0.4-0.3 % SOLN Place one drop in both eyes daily as needed 30 mL 2   torsemide (DEMADEX) 5 MG tablet Take 5 mg by mouth daily.     Shirley Friar  5 % SOLN Place 1 drop into both eyes 2 (two) times daily.      No current facility-administered medications on file prior to visit.    ALLERGIES: Allergies  Allergen Reactions   Amoxicillin-Pot Clavulanate Other (See Comments)    Severe pains and GI symptoms Did it involve swelling of the face/tongue/throat, SOB, or low BP? No Did it involve sudden or severe rash/hives, skin peeling, or any reaction on the inside of your mouth or nose? No Did you need to seek medical attention at a hospital or doctor's office? No When did it last happen?      within the past 10 years If all above answers are "NO", may proceed with cephalosporin use.    Temazepam Other (See Comments)     Dizziness and doesn't work for patient   Lipitor [Atorvastatin Calcium]     Muscle pain   Penicillin G     Other reaction(s): Unknown    FAMILY HISTORY: Family History  Problem Relation Age of Onset   Cancer Mother        breast   Hypertension Mother    Breast cancer Mother    Cancer Father        prostate   Cancer Brother    Diabetes Sister    Heart attack Sister    Migraines Neg Hx     Objective:  Blood pressure (!) 147/82, pulse 93, height 5' (1.524 m), weight 129 lb (58.5 kg), SpO2 96 %. General: No acute distress.  Patient appears well-groomed.   Head:  Normocephalic/atraumatic Eyes:  fundi examined but not visualized Neck: supple, no paraspinal tenderness, full range of motion Carotids:  No bruits Heart: regular rate and rhythm Lungs: Clear to auscultation bilaterally. Vascular: No carotid bruits. Neurological Exam: Mental status: alert and oriented to person, place, and time, recent and remote memory intact, fund of knowledge intact, attention and concentration intact, speech fluent and not dysarthric, language intact. Cranial nerves: CN I: not tested CN II: pupils equal, round and reactive to light, visual fields intact CN III, IV, VI:  full range of motion, no nystagmus, no ptosis CN V: facial sensation intact. CN VII: upper and lower face symmetric CN VIII: hearing intact CN IX, X: gag intact, uvula midline CN XI: sternocleidomastoid and trapezius muscles intact CN XII: tongue midline Bulk & Tone: normal, no fasciculations. Motor:  muscle strength 5/5 throughout Sensation:  Pinprick, temperature and vibratory sensation intact. Deep Tendon Reflexes:  2+ throughout,  toes downgoing.   Finger to nose testing:  Without dysmetria.   Heel to shin:  Without dysmetria.   Gait:  Normal station and stride.  Romberg negative.    Thank you for allowing me to take part in the care of this patient.  Metta Clines, DO  CC: Nolene Ebbs, MD

## 2022-01-09 ENCOUNTER — Other Ambulatory Visit: Payer: Self-pay | Admitting: Allergy and Immunology

## 2022-01-09 ENCOUNTER — Ambulatory Visit (INDEPENDENT_AMBULATORY_CARE_PROVIDER_SITE_OTHER): Payer: Medicare HMO | Admitting: Neurology

## 2022-01-09 ENCOUNTER — Other Ambulatory Visit: Payer: Self-pay

## 2022-01-09 ENCOUNTER — Encounter: Payer: Self-pay | Admitting: Neurology

## 2022-01-09 VITALS — BP 147/82 | HR 93 | Ht 60.0 in | Wt 129.0 lb

## 2022-01-09 DIAGNOSIS — G43709 Chronic migraine without aura, not intractable, without status migrainosus: Secondary | ICD-10-CM | POA: Diagnosis not present

## 2022-01-09 DIAGNOSIS — D320 Benign neoplasm of cerebral meninges: Secondary | ICD-10-CM | POA: Diagnosis not present

## 2022-01-09 DIAGNOSIS — H93A1 Pulsatile tinnitus, right ear: Secondary | ICD-10-CM

## 2022-01-09 MED ORDER — ZONISAMIDE 25 MG PO CAPS: ORAL_CAPSULE | ORAL | 5 refills | Status: DC

## 2022-01-09 NOTE — Patient Instructions (Signed)
Take zonisamide 25mg  daily for one week, then increase to 50mg  daily.  If no improvement in 2 months contact me Limit use of pain relievers (like Tylenol) to no more than 2 days out of week to prevent risk of rebound or medication-overuse headache. Check CTA head and neck Follow up 6 months.

## 2022-01-10 ENCOUNTER — Ambulatory Visit (INDEPENDENT_AMBULATORY_CARE_PROVIDER_SITE_OTHER): Payer: Medicare HMO | Admitting: Allergy and Immunology

## 2022-01-10 ENCOUNTER — Encounter: Payer: Self-pay | Admitting: Allergy and Immunology

## 2022-01-10 VITALS — BP 124/80 | HR 96 | Temp 98.2°F | Resp 18

## 2022-01-10 DIAGNOSIS — K219 Gastro-esophageal reflux disease without esophagitis: Secondary | ICD-10-CM

## 2022-01-10 DIAGNOSIS — J4540 Moderate persistent asthma, uncomplicated: Secondary | ICD-10-CM

## 2022-01-10 DIAGNOSIS — J3089 Other allergic rhinitis: Secondary | ICD-10-CM

## 2022-01-10 NOTE — Patient Instructions (Addendum)
  1.  Continue Flovent 44 - 2 inhalations 1 time a day    2.  Continue Flonase - 1 spray each nostril 1 time per day    3.  Continue Dexilant 30 mg in a.m.    4.  Can use nasal saline and systane eye drops if needed.  5.  Can use albuterol HFA 2 puffs every 4-6 hours if needed.  6. Can increase Flovent and Flonase to 2 times per day during 'Flare Up"   7. Return to clinic in 6 months or earlier if problem

## 2022-01-10 NOTE — Progress Notes (Signed)
[place] [facility] [place] [facility] [place]   Follow-up Note  Referring Provider: Nolene Ebbs, MD Primary Provider: Nolene Ebbs, MD Date of Office Visit: 01/10/2022  Subjective:   Autumn Fitzgerald (DOB: 12-10-1946) is a 76 y.o. female who returns to the [facility] on 01/10/2022 in re-evaluation of the following:  HPI: [name] presents to this clinic in reevaluation of asthma, allergic rhinitis, and LPR.  Her last visit to this clinic with me was 05 July 2021 and she did visit with our nurse practitioner on 16 December 2021 with a viral induced flare of her respiratory tract disease.  Fortunately, after about 10 days or so she resolved her viral induced respiratory tract flare during her last visit and she is now back to baseline.  She does not use any short acting bronchodilators to any significant degree.  She can exert herself to the extent that she so desires without any problem.  She has had very little issues with her nose.  She is consistently using her Flovent and Flonase at this point.  Her reflux is under very good control using Dexilant.  She will be having a head imaging procedure for her migraines as directed by her neurologist.    She will be visiting [place] on 19 January 2022 for a 10-day visit.  Allergies as of 01/10/2022       Reactions   Amoxicillin-pot Clavulanate Other (See Comments)   Severe pains and GI symptoms Did it involve swelling of the face/tongue/throat, SOB, or low BP? No Did it involve sudden or severe rash/hives, skin peeling, or any reaction on the inside of your mouth or nose? No Did you need to seek medical attention at a hospital or doctor's office? No When did it last happen?      within the past 10 years If all above answers are "NO", may proceed with cephalosporin use.   Temazepam Other (See Comments)   Dizziness and doesn't work for patient   Lipitor [atorvastatin Calcium]    Muscle pain   Penicillin G  Other (See Comments)   Other reaction(s): Unknown        Medication List    acetaminophen 500 MG tablet Commonly known as: TYLENOL Take 500 mg by mouth every 6 (six) hours as needed for moderate pain or headache.   albuterol 108 (90 Base) MCG/ACT inhaler Commonly known as: VENTOLIN HFA INHALE TWO PUFFS BY MOUTH INTO LUNGS EVERY 6 HOURS AS NEEDED FOR WHEEZING/SHORTNESS OF BREATH   atenolol 25 MG tablet Commonly known as: TENORMIN Take 0.5 tablets (12.5 mg total) by mouth daily.   clobetasol cream 0.05 % Commonly known as: TEMOVATE Apply 1 application topically 2 (two) times daily.   clonazePAM 2 MG tablet Commonly known as: KLONOPIN Take by mouth.   clonazePAM 1 MG tablet Commonly known as: KLONOPIN Take 2 mg by mouth at bedtime.   Dexilant 30 MG capsule DR Generic drug: Dexlansoprazole   ezetimibe-simvastatin 10-40 MG tablet Commonly known as: VYTORIN Take 1 tablet by mouth every evening.   famotidine 40 MG tablet Commonly known as: PEPCID Take 1 tablet (40 mg total) by mouth daily.   fluticasone 44 MCG/ACT inhaler Commonly known as: Flovent HFA INHALE TWO PUFFS BY MOUTH TWICE A DAY WITH SPACER   fluticasone 50 MCG/ACT nasal spray Commonly known as: Flonase Place 1 spray into both nostrils daily.   levothyroxine 50 MCG tablet Commonly known as: SYNTHROID Take 50 mcg by mouth daily.  Polyethyl Glycol-Propyl Glycol 0.4-0.3 % Soln Place one drop in both eyes daily as needed   torsemide 5 MG tablet Commonly known as: DEMADEX Take 5 mg by mouth daily.   Xiidra 5 % Soln Generic drug: Lifitegrast Place 1 drop into both eyes 2 (two) times daily.   zonisamide 25 MG capsule Commonly known as: ZONEGRAN Take 1 capsule daily for one week, then 2 capsules daily   Zylet 0.5-0.3 % Susp Generic drug: Loteprednol-Tobramycin Apply to eye.     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)    Hypothyroidism    followed by pcp   LBBB (left bundle branch block)    Mild persistent allergic asthma    Palpitations    Panic attacks    PONV (postoperative nausea and vomiting)    Pre-diabetes    Wears glasses     Past Surgical History:  Procedure Laterality Date   CARDIAC SURGERY  02/05/2007   in [place]  for HOCM   CATARACT EXTRACTION W/ INTRAOCULAR LENS  IMPLANT, BILATERAL  right 07-23-2017;  left 10-15-2017   COLONOSCOPY N/A 11/24/2013   Procedure: COLONOSCOPY;  Surgeon: Juanita Craver, MD;  [facility];  Service: Endoscopy;  Laterality: N/A;   DOPPLER ECHOCARDIOGRAPHY  03/14/2013   EF 65-70%,trivial AI,mild-mod. MR,Mod. TR,mod. concentric hypertrophy   EP IMPLANTABLE DEVICE N/A 08/22/2016   Procedure: Loop Recorder Removal;  Surgeon: Sanda Klein, MD;  Location: [facility];  Service: Cardiovascular;  Laterality: N/A;   ESOPHAGOGASTRODUODENOSCOPY N/A 11/24/2013   Procedure: ESOPHAGOGASTRODUODENOSCOPY (EGD);  Surgeon: Juanita Craver, MD;  [facility];  Service: Endoscopy;  Laterality: N/A;   GLAUCOMA SURGERY Bilateral 2019   "release pressure   KNEE ARTHROSCOPY Right 10/17/2013   Procedure: ARTHROSCOPY RIGHT KNEE;  Surgeon: [name], MD;  Location: [facility];  Service: Orthopedics;  Laterality: Right;   LAPAROSCOPIC CHOLECYSTECTOMY  02-03-2010   dr d blackman  @[facility]    LOOP RECORDER IMPLANT  03/18/2013   LOOP RECORDER IMPLANT N/A 03/18/2013   Procedure: LOOP RECORDER IMPLANT;  Surgeon: Sanda Klein, MD;  Location: [facility];  Service: Cardiovascular;  Laterality: N/A;   SHOULDER ARTHROSCOPY WITH ROTATOR CUFF REPAIR Right 01/16/2019    Procedure: Right shoulder arthroscopy with subacromial decompression, distal clavicle resection, rotator cuff repair, bicep tenotomy;  Surgeon: Justice Britain, MD;  [facility];  Service: Orthopedics;  Laterality: Right;   TUBAL LIGATION  yrs ago    Review of systems negative except as noted in HPI / PMHx or noted below:  Review of Systems  Constitutional: Negative.   HENT: Negative.    Eyes: Negative.   Respiratory: Negative.    Cardiovascular: Negative.   Gastrointestinal: Negative.   Genitourinary: Negative.   Musculoskeletal: Negative.   Skin: Negative.   Neurological: Negative.   Endo/Heme/Allergies: Negative.   Psychiatric/Behavioral: Negative.      Objective:   Vitals:   01/10/22 1326  BP: 124/80  Pulse: 96  Resp: 18  Temp: 98.2 F (36.8 C)  SpO2: 97%  Physical Exam Constitutional:      Appearance: She is not diaphoretic.  HENT:     Head: Normocephalic.     Right Ear: Tympanic membrane, ear canal and external ear normal.     Left Ear: Tympanic membrane, ear canal and external ear normal.     Nose: Nose normal. No mucosal edema or rhinorrhea.     Mouth/Throat:     Pharynx: Uvula midline. No oropharyngeal exudate.  Eyes:     Conjunctiva/sclera: Conjunctivae normal.  Neck:     Thyroid: No thyromegaly.     Trachea: Trachea normal. No tracheal tenderness or tracheal deviation.  Cardiovascular:     Rate and Rhythm: Normal rate and regular rhythm.     Heart sounds: Normal heart sounds, S1 normal and S2 normal. No murmur heard. Pulmonary:     Effort: No respiratory distress.     Breath sounds: Normal breath sounds. No stridor. No wheezing or rales.  Lymphadenopathy:     Head:     Right side of head: No tonsillar adenopathy.     Left side of head: No tonsillar adenopathy.     Cervical: No cervical adenopathy.  Skin:    Findings: No erythema or rash.     Nails: There is no clubbing.  Neurological:     Mental Status: She is alert.     Diagnostics:    Spirometry was performed and demonstrated an FEV1 of 1.21 at 71 % of predicted.  The patient had an Asthma Control Test with the following results: ACT Total Score: 21.    Assessment and Plan:   1. Asthma, moderate persistent, well-controlled   2. Other allergic rhinitis   3. LPRD (laryngopharyngeal reflux disease)      1.  Continue Flovent 44 - 2 inhalations 1 time a day    2.  Continue Flonase - 1 spray each nostril 1 time per day    3.  Continue Dexilant 30 mg in a.m.    4.  Can use nasal saline and systane eye drops if needed.  5.  Can use albuterol HFA 2 puffs every 4-6 hours if needed.  6. Can increase Flovent and Flonase to 2 times per day during 'Flare Up"   7. Return to clinic in 6 months or earlier if problem  Lynnann appears to be doing pretty well at this point in time with attention to therapy directed against respiratory tract inflammation and against reflux induced respiratory disease as noted above.  We will keep her on a low-dose of Flovent and Flonase and Dexilant.  Assuming she does well with this plan I will see her back in this clinic in 6 months or earlier if there is a problem.  Allena Katz, MD Allergy / Immunology [place]

## 2022-01-11 ENCOUNTER — Encounter: Payer: Self-pay | Admitting: Allergy and Immunology

## 2022-02-02 ENCOUNTER — Ambulatory Visit
Admission: RE | Admit: 2022-02-02 | Discharge: 2022-02-02 | Disposition: A | Discharge status: Home / Self Care | Payer: Medicare HMO | Source: Ambulatory Visit | Attending: Neurology | Admitting: Neurology

## 2022-02-02 ENCOUNTER — Other Ambulatory Visit: Payer: Medicare HMO

## 2022-02-02 ENCOUNTER — Other Ambulatory Visit: Payer: Self-pay

## 2022-02-02 DIAGNOSIS — H93A1 Pulsatile tinnitus, right ear: Secondary | ICD-10-CM

## 2022-02-02 MED ORDER — IOPAMIDOL (ISOVUE-370) INJECTION 76%
75.0000 mL | Freq: Once | INTRAVENOUS | Status: AC | PRN
  Administered 2022-02-02: 75 mL via INTRAVENOUS

## 2022-02-03 ENCOUNTER — Telehealth: Payer: Self-pay | Admitting: Neurology

## 2022-02-03 ENCOUNTER — Other Ambulatory Visit: Payer: Self-pay

## 2022-02-03 DIAGNOSIS — G43709 Chronic migraine without aura, not intractable, without status migrainosus: Secondary | ICD-10-CM

## 2022-02-03 DIAGNOSIS — H93A1 Pulsatile tinnitus, right ear: Secondary | ICD-10-CM

## 2022-02-03 NOTE — Telephone Encounter (Signed)
Patient said she missed a call from someone and she thinks it cause of her results

## 2022-02-19 ENCOUNTER — Other Ambulatory Visit: Payer: Self-pay

## 2022-02-19 ENCOUNTER — Ambulatory Visit
Admission: RE | Admit: 2022-02-19 | Discharge: 2022-02-19 | Disposition: A | Discharge status: Home / Self Care | Payer: Medicare HMO | Source: Ambulatory Visit | Attending: Neurology | Admitting: Neurology

## 2022-02-19 DIAGNOSIS — G43709 Chronic migraine without aura, not intractable, without status migrainosus: Secondary | ICD-10-CM

## 2022-02-19 DIAGNOSIS — H93A1 Pulsatile tinnitus, right ear: Secondary | ICD-10-CM

## 2022-02-19 MED ORDER — GADOBENATE DIMEGLUMINE 529 MG/ML IV SOLN
11.0000 mL | Freq: Once | INTRAVENOUS | Status: AC | PRN
  Administered 2022-02-19: 11 mL via INTRAVENOUS

## 2022-02-23 DIAGNOSIS — M1812 Unilateral primary osteoarthritis of first carpometacarpal joint, left hand: Secondary | ICD-10-CM | POA: Insufficient documentation

## 2022-02-24 ENCOUNTER — Telehealth: Payer: Self-pay | Admitting: Neurology

## 2022-02-24 NOTE — Telephone Encounter (Signed)
Patient called and stated she was calling to go over results from testing she had done on Feb 23?

## 2022-02-24 NOTE — Telephone Encounter (Signed)
See results note. 

## 2022-02-24 NOTE — Progress Notes (Signed)
Pt advised of her results.

## 2022-02-25 ENCOUNTER — Other Ambulatory Visit: Payer: Self-pay | Admitting: Obstetrics and Gynecology

## 2022-03-08 ENCOUNTER — Other Ambulatory Visit: Payer: Self-pay | Admitting: Allergy and Immunology

## 2022-03-16 ENCOUNTER — Other Ambulatory Visit: Payer: Self-pay | Admitting: *Deleted

## 2022-03-16 ENCOUNTER — Encounter: Payer: Self-pay | Admitting: *Deleted

## 2022-03-16 DIAGNOSIS — N904 Leukoplakia of vulva: Secondary | ICD-10-CM

## 2022-03-16 MED ORDER — CLOBETASOL PROPIONATE 0.05 % EX CREA: 1.0000 "application " | TOPICAL_CREAM | Freq: Two times a day (BID) | CUTANEOUS | 5 refills | Status: DC

## 2022-03-16 NOTE — Progress Notes (Signed)
Request to fill RX Clobetasol cream through [facility] mail order instead of [facility]. RX sent to [facility].

## 2022-03-27 ENCOUNTER — Encounter: Payer: Self-pay | Admitting: Obstetrics and Gynecology

## 2022-03-27 ENCOUNTER — Ambulatory Visit (INDEPENDENT_AMBULATORY_CARE_PROVIDER_SITE_OTHER): Payer: Medicare HMO | Admitting: Obstetrics and Gynecology

## 2022-03-27 VITALS — BP 128/80 | HR 91 | Ht 60.0 in | Wt 131.0 lb

## 2022-03-27 DIAGNOSIS — N904 Leukoplakia of vulva: Secondary | ICD-10-CM | POA: Diagnosis not present

## 2022-03-27 NOTE — Progress Notes (Signed)
76 yo P4 with BMI 25, postmenopausal who returns for follow up on lichen sclerosis. Patient reports applying clobetasol cream 3 times a week. She denies any vulva pruritus. She is not sexually active. She denies any episodes of postmenopausal vaginal bleeding. She denies urinary incontinence. She is being followed by a cardiologist for cardiomyopathy and a neurologist for migraines. Patient is without any complaints.   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)    Hypothyroidism    followed by pcp   LBBB (left bundle branch block)    Mild persistent allergic asthma    Palpitations    Panic attacks    PONV (postoperative nausea and vomiting)    Pre-diabetes    Wears glasses    Past Surgical History:  Procedure Laterality Date   CARDIAC SURGERY  02/05/2007   in [place]  for HOCM   CATARACT EXTRACTION W/ INTRAOCULAR LENS  IMPLANT, BILATERAL  right 07-23-2017;  left 10-15-2017   COLONOSCOPY N/A 11/24/2013   Procedure: COLONOSCOPY;  Surgeon: Juanita Craver, MD;  [facility];  Service: Endoscopy;  Laterality: N/A;   DOPPLER ECHOCARDIOGRAPHY  03/14/2013   EF 65-70%,trivial AI,mild-mod. MR,Mod. TR,mod. concentric hypertrophy   EP IMPLANTABLE DEVICE N/A 08/22/2016   Procedure: Loop Recorder Removal;  Surgeon: Sanda Klein, MD;  Location: [facility];  Service: Cardiovascular;  Laterality: N/A;   ESOPHAGOGASTRODUODENOSCOPY N/A 11/24/2013   Procedure: ESOPHAGOGASTRODUODENOSCOPY (EGD);  Surgeon: Juanita Craver, MD;  [facility];  Service: Endoscopy;  Laterality: N/A;   GLAUCOMA SURGERY Bilateral 2019    "release pressure   KNEE ARTHROSCOPY Right 10/17/2013   Procedure: ARTHROSCOPY RIGHT KNEE;  Surgeon: [name], MD;  Location: [facility];  Service: Orthopedics;  Laterality: Right;   LAPAROSCOPIC CHOLECYSTECTOMY  02-03-2010   dr d blackman  '@[facility]'$    LOOP RECORDER IMPLANT  03/18/2013   LOOP RECORDER IMPLANT N/A 03/18/2013   Procedure: LOOP RECORDER IMPLANT;  Surgeon: Sanda Klein, MD;  Location: [place];  Service: Cardiovascular;  Laterality: N/A;   SHOULDER ARTHROSCOPY WITH ROTATOR CUFF REPAIR Right 01/16/2019   Procedure: Right shoulder arthroscopy with subacromial decompression, distal clavicle resection, rotator cuff repair, bicep tenotomy;  Surgeon: Justice Britain, MD;  [facility];  Service: Orthopedics;  Laterality: Right;   TUBAL LIGATION  yrs ago   Family History  Problem Relation Age of Onset   Cancer Mother        breast   Hypertension Mother    Breast cancer Mother    Cancer Father        prostate   Cancer Brother    Diabetes Sister    Heart attack Sister    Migraines Neg Hx    Social History   Tobacco Use   Smoking status: Never   Smokeless tobacco: Never  Vaping Use   Vaping Use: Never used  Substance Use Topics   Alcohol use: No   Drug use: No   ROS See pertinent in HPI. All other systems reviewed and non contributory Blood pressure 128/80,  pulse 91, height 5' (1.524 m), weight 131 lb (59.4 kg). GENERAL: Well-developed, well-nourished female in no acute distress.  ABDOMEN: Soft, nontender, nondistended. No organomegaly. PELVIC: Normal external female genitalia. Vagina is pale and atrophic with evidence of lichen sclerosis at 6 and 12 o'clock approximately 1 cm lesions.  Normal discharge. Normal appearing cervix. Uterus is normal in size. No adnexal mass or tenderness. Chaperone present during the pelvic exam EXTREMITIES: No cyanosis, clubbing, or edema, 2+ distal pulses.  [name] 76 yo here for follow up on lichen sclerosis - Continue current regimen -  RTC in 6 months - Follow up with specialist as scheduled

## 2022-03-27 NOTE — Progress Notes (Signed)
Follow up lichen sclerosis.

## 2022-04-05 ENCOUNTER — Emergency Department (HOSPITAL_BASED_OUTPATIENT_CLINIC_OR_DEPARTMENT_OTHER): Payer: Medicare HMO | Admitting: Radiology

## 2022-04-05 ENCOUNTER — Emergency Department (HOSPITAL_BASED_OUTPATIENT_CLINIC_OR_DEPARTMENT_OTHER): Payer: Medicare HMO

## 2022-04-05 ENCOUNTER — Encounter (HOSPITAL_BASED_OUTPATIENT_CLINIC_OR_DEPARTMENT_OTHER): Payer: Self-pay

## 2022-04-05 ENCOUNTER — Other Ambulatory Visit: Payer: Self-pay

## 2022-04-05 ENCOUNTER — Emergency Department (HOSPITAL_BASED_OUTPATIENT_CLINIC_OR_DEPARTMENT_OTHER)
Admission: EM | Admit: 2022-04-05 | Discharge: 2022-04-05 | Disposition: A | Discharge status: Home / Self Care | Payer: Medicare HMO | Attending: Emergency Medicine | Admitting: Emergency Medicine

## 2022-04-05 DIAGNOSIS — M79605 Pain in left leg: Secondary | ICD-10-CM | POA: Diagnosis present

## 2022-04-05 DIAGNOSIS — M25562 Pain in left knee: Secondary | ICD-10-CM | POA: Diagnosis not present

## 2022-04-05 MED ORDER — OXYCODONE-ACETAMINOPHEN 5-325 MG PO TABS: 1.0000 | ORAL_TABLET | Freq: Once | ORAL | Status: DC

## 2022-04-05 NOTE — Discharge Instructions (Signed)
Follow-up with your primary care doctor regarding your symptoms from today.  Can also follow-up with a sports medicine or orthopedic doctor.  Recommend Tylenol, Motrin as needed for pain control.  Come back here if you are having worsening leg pain, swelling, redness or other new concerning symptom.

## 2022-04-07 NOTE — ED Provider Notes (Signed)
[facility] EMERGENCY DEPT Provider Note   CSN: [id number] Arrival date & time: 04/05/22  1023     History  Chief Complaint  Patient presents with   Leg Pain    Autumn Fitzgerald is a 76 y.o. female.  Presented to the emergency department due to concern for leg pain.  Patient reports that she is having pain in her left knee but it extends up her leg and down her leg somewhat.  Pain is worse with movement and improved with rest.  She does not recall any particular trauma.  She has not noted any significant swelling in her knee or leg.  She denies any numbness or weakness in her leg.  Has seen orthopedic surgery for her other knee previously.  HPI     Home Medications Prior to Admission medications   Medication Sig Start Date End Date Taking? Authorizing Provider  acetaminophen (TYLENOL) 500 MG tablet Take 500 mg by mouth every 6 (six) hours as needed for moderate pain or headache.     [provider]  albuterol (VENTOLIN HFA) 108 (90 Base) MCG/ACT inhaler INHALE TWO PUFFS BY MOUTH INTO LUNGS EVERY 6 HOURS AS NEEDED FOR WHEEZING/SHORTNESS OF BREATH 01/09/22   Ambs, Kathrine Cords, FNP  atenolol (TENORMIN) 25 MG tablet Take 0.5 tablets (12.5 mg total) by mouth daily. 12/03/19   Croitoru, Mihai, MD  clobetasol cream (TEMOVATE) 8.92 % Apply 1 application. topically 2 (two) times daily. 03/16/22   Constant, Peggy, MD  clonazePAM (KLONOPIN) 1 MG tablet Take 2 mg by mouth at bedtime.     [provider]  clonazePAM (KLONOPIN) 2 MG tablet Take by mouth. 01/09/22   [provider]  DEXILANT 30 MG capsule  03/17/21   [provider]  ezetimibe-simvastatin (VYTORIN) 10-40 MG per tablet Take 1 tablet by mouth every evening.     [provider]  famotidine (PEPCID) 40 MG tablet Take 1 tablet (40 mg total) by mouth daily. Patient not taking: Reported on 01/10/2022 07/05/21   Jiles Prows, MD  fluticasone [facility]) 50 MCG/ACT nasal spray Place 1 spray into  both nostrils daily. 01/04/21   Kozlow, Donnamarie Poag, MD  fluticasone (FLOVENT HFA) 44 MCG/ACT inhaler INHALE 2 PUFFS INTO THE LUNGS DAILY WITH SPACER 03/08/22   Kozlow, Donnamarie Poag, MD  levothyroxine (SYNTHROID) 50 MCG tablet Take 50 mcg by mouth daily.  06/15/14   [provider]  Polyethyl Glycol-Propyl Glycol 0.4-0.3 % SOLN Place one drop in both eyes daily as needed 01/04/21   Kozlow, Donnamarie Poag, MD  torsemide (DEMADEX) 5 MG tablet Take 5 mg by mouth daily.    [provider]  XIIDRA 5 % SOLN Place 1 drop into both eyes 2 (two) times daily.  08/07/18   [provider]  zonisamide (ZONEGRAN) 25 MG capsule Take 1 capsule daily for one week, then 2 capsules daily 01/09/22   Pieter Partridge, DO  ZYLET 0.5-0.3 % SUSP Apply to eye. 12/31/21   [provider]      Allergies    Amoxicillin-pot clavulanate, Temazepam, Lipitor [atorvastatin calcium], and Penicillin g    Review of Systems   Review of Systems  Musculoskeletal:  Positive for arthralgias.  All other systems reviewed and are negative.  Physical Exam Updated Vital Signs BP 133/63 (BP Location: Right Arm)   Pulse 84   Temp 98.2 F (36.8 C) (Oral)   Resp 17   SpO2 98%  Physical Exam Vitals and nursing note reviewed.  Constitutional:      General: She is not in acute distress.    Appearance: She is well-developed.  HENT:     Head: Normocephalic and atraumatic.  Eyes:     Conjunctiva/sclera: Conjunctivae normal.  Cardiovascular:     Rate and Rhythm: Normal rate and regular rhythm.     Pulses: Normal pulses.  Pulmonary:     Effort: Pulmonary effort is normal. No respiratory distress.  Abdominal:     General: There is no distension.  Musculoskeletal:        General: No swelling.     Cervical back: Neck supple.     Comments: Left lower extremity: There is some tenderness to the thigh, knee, calf regions, no deformity noted, there is normal DP and PT pulses,  Skin:    General: Skin is warm and dry.      Capillary Refill: Capillary refill takes less than 2 seconds.  Neurological:     General: No focal deficit present.     Mental Status: She is alert.     Comments: Sensation and motor intact in her lower extremities  Psychiatric:        Mood and Affect: Mood normal.    ED Results / Procedures / Treatments   Labs (all labs ordered are listed, but only abnormal results are displayed) Labs Reviewed - No data to display  EKG None  Radiology US Venous Img Lower Unilateral Left  Result Date: 04/05/2022 CLINICAL DATA:  Left leg pain and swelling EXAM: LEFT LOWER EXTREMITY VENOUS DOPPLER ULTRASOUND TECHNIQUE: Gray-scale sonography with compression, as well as color and duplex ultrasound, were performed to evaluate the deep venous system(s) from the level of the common femoral vein through the popliteal and proximal calf veins. COMPARISON:  None. FINDINGS: VENOUS Normal compressibility of the common femoral, superficial femoral, and popliteal veins, as well as the visualized calf veins. Visualized portions of profunda femoral vein and great saphenous vein unremarkable. No filling defects to suggest DVT on grayscale or color Doppler imaging. Doppler waveforms show normal direction of venous flow, normal respiratory plasticity and response to augmentation. Limited views of the contralateral common femoral vein are unremarkable. OTHER None. Limitations: none IMPRESSION: Negative. Electronically Signed   By: Jacqulynn Cadet M.D.   On: 04/05/2022 11:48   DG Knee Complete 4 Views Left  Result Date: 04/05/2022 CLINICAL DATA:  Left knee pain EXAM: LEFT KNEE - COMPLETE 4+ VIEW COMPARISON:  None. FINDINGS: No evidence of fracture, dislocation, or joint effusion. No evidence of arthropathy or other focal bone abnormality. Soft tissues are unremarkable. IMPRESSION: No acute osseous abnormality identified. Electronically Signed   By: Ofilia Neas M.D.   On: 04/05/2022 11:15    Procedures Procedures     Medications Ordered in ED Medications - No data to display  ED Course/ Medical Decision Making/ A&P                           Medical Decision Making Amount and/or Complexity of Data Reviewed Radiology: ordered.   76 year old lady presents to ER due to concern for left knee and left leg pain.  On exam her leg appears normal.  Do not appreciate any significant swelling or deformity.  Normal ROM.  No warmth, no fever, doubt septic joint.  Plain film was independently reviewed by myself.  No acute fracture or dislocation.  DVT study was negative.  I independently reviewed ultrasound images.  She has good distal pulses in  her foot, doubt clinically significant peripheral arterial disease as cause based on this finding.  Given her reassuring physical exam, ultrasound and x-ray, suspect more likely MSK strain and feel she is appropriate for outpatient management.  Recommend following up with Ortho or sports medicine.  Discharged home with family.    After the discussed management above, the patient was determined to be safe for discharge.  The patient was in agreement with this plan and all questions regarding their care were answered.  ED return precautions were discussed and the patient will return to the ED with any significant worsening of condition.         Final Clinical Impression(s) / ED Diagnoses Final diagnoses:  Pain of left lower extremity    Rx / DC Orders ED Discharge Orders     None         Lucrezia Starch, MD 04/07/22 [phone]

## 2022-04-14 ENCOUNTER — Telehealth: Payer: Self-pay | Admitting: Allergy and Immunology

## 2022-04-14 MED ORDER — ALBUTEROL SULFATE HFA 108 (90 BASE) MCG/ACT IN AERS: 2.0000 | INHALATION_SPRAY | RESPIRATORY_TRACT | 1 refills | Status: DC | PRN

## 2022-04-14 NOTE — Telephone Encounter (Signed)
Autumn Fitzgerald states she was supposed to have Ventolin called in for her a while ago and it was never called in.  She would like Ventolin called in to [facility] on [address]

## 2022-04-14 NOTE — Telephone Encounter (Signed)
Called and spoke to patient and she stated that she use to get albuterol every month. I asked if she was having issues breathing and if she was still using her Flovent per her last OV. Patient verbalized that she was. I have sent in another refill for patient. I informed her that she needed to contact us if she is using her albuterol daily or more than three times a week. Patient verbalized understanding and agreed to do so. I also called the pharmacy and they stated that patient picked up last prescription on February 08, 2022.

## 2022-05-02 DIAGNOSIS — M25562 Pain in left knee: Secondary | ICD-10-CM | POA: Insufficient documentation

## 2022-05-26 DIAGNOSIS — F333 Major depressive disorder, recurrent, severe with psychotic symptoms: Secondary | ICD-10-CM | POA: Diagnosis not present

## 2022-05-29 DIAGNOSIS — M25562 Pain in left knee: Secondary | ICD-10-CM | POA: Diagnosis not present

## 2022-05-31 DIAGNOSIS — G43809 Other migraine, not intractable, without status migrainosus: Secondary | ICD-10-CM | POA: Diagnosis not present

## 2022-05-31 DIAGNOSIS — H40033 Anatomical narrow angle, bilateral: Secondary | ICD-10-CM | POA: Diagnosis not present

## 2022-05-31 DIAGNOSIS — H43813 Vitreous degeneration, bilateral: Secondary | ICD-10-CM | POA: Diagnosis not present

## 2022-06-06 DIAGNOSIS — M791 Myalgia, unspecified site: Secondary | ICD-10-CM | POA: Diagnosis not present

## 2022-06-06 DIAGNOSIS — G518 Other disorders of facial nerve: Secondary | ICD-10-CM | POA: Diagnosis not present

## 2022-06-06 DIAGNOSIS — M542 Cervicalgia: Secondary | ICD-10-CM | POA: Diagnosis not present

## 2022-06-06 DIAGNOSIS — G43719 Chronic migraine without aura, intractable, without status migrainosus: Secondary | ICD-10-CM | POA: Diagnosis not present

## 2022-06-14 ENCOUNTER — Other Ambulatory Visit: Payer: Self-pay

## 2022-06-14 MED ORDER — ALBUTEROL SULFATE HFA 108 (90 BASE) MCG/ACT IN AERS: INHALATION_SPRAY | RESPIRATORY_TRACT | 0 refills | Status: DC

## 2022-06-19 DIAGNOSIS — I10 Essential (primary) hypertension: Secondary | ICD-10-CM | POA: Diagnosis not present

## 2022-06-19 DIAGNOSIS — J4520 Mild intermittent asthma, uncomplicated: Secondary | ICD-10-CM | POA: Diagnosis not present

## 2022-06-19 DIAGNOSIS — R7303 Prediabetes: Secondary | ICD-10-CM | POA: Diagnosis not present

## 2022-06-19 DIAGNOSIS — F411 Generalized anxiety disorder: Secondary | ICD-10-CM | POA: Diagnosis not present

## 2022-06-19 DIAGNOSIS — G43909 Migraine, unspecified, not intractable, without status migrainosus: Secondary | ICD-10-CM | POA: Diagnosis not present

## 2022-06-19 DIAGNOSIS — G4700 Insomnia, unspecified: Secondary | ICD-10-CM | POA: Diagnosis not present

## 2022-06-20 DIAGNOSIS — G43719 Chronic migraine without aura, intractable, without status migrainosus: Secondary | ICD-10-CM | POA: Diagnosis not present

## 2022-06-20 DIAGNOSIS — M791 Myalgia, unspecified site: Secondary | ICD-10-CM | POA: Diagnosis not present

## 2022-06-20 DIAGNOSIS — G518 Other disorders of facial nerve: Secondary | ICD-10-CM | POA: Diagnosis not present

## 2022-06-20 DIAGNOSIS — M542 Cervicalgia: Secondary | ICD-10-CM | POA: Diagnosis not present

## 2022-06-22 ENCOUNTER — Encounter: Payer: Self-pay | Admitting: Cardiovascular Disease

## 2022-06-22 ENCOUNTER — Ambulatory Visit (INDEPENDENT_AMBULATORY_CARE_PROVIDER_SITE_OTHER): Payer: Medicare HMO | Admitting: Cardiovascular Disease

## 2022-06-22 VITALS — BP 116/70 | HR 80 | Ht 60.0 in | Wt 132.2 lb

## 2022-06-22 DIAGNOSIS — I421 Obstructive hypertrophic cardiomyopathy: Secondary | ICD-10-CM | POA: Diagnosis not present

## 2022-06-22 DIAGNOSIS — I447 Left bundle-branch block, unspecified: Secondary | ICD-10-CM

## 2022-06-22 DIAGNOSIS — I10 Essential (primary) hypertension: Secondary | ICD-10-CM

## 2022-06-22 DIAGNOSIS — E7800 Pure hypercholesterolemia, unspecified: Secondary | ICD-10-CM | POA: Diagnosis not present

## 2022-06-22 DIAGNOSIS — E039 Hypothyroidism, unspecified: Secondary | ICD-10-CM

## 2022-06-22 NOTE — Progress Notes (Signed)
Cardiology Office Note:    Date:  06/22/2022   ID:  Autumn Fitzgerald, DOB 04/13/1946, MRN [id number]  PCP:  Nolene Ebbs, MD  Cardiologist:  Sanda Klein, MD  Electrophysiologist:  None   Referring MD: Nolene Ebbs, MD   No chief complaint on file.    History of Present Illness:    Autumn Fitzgerald is a 76 y.o. female with a hx of HOCM s/p septal myectomy in 2005, GERD, HTN, HLD, history of near syncope s/p loop recorder that was explanted in 2017, hypothyroidism, LBBB and prediabetes, meningioma.  Previous loop recorder did not show any meaningful arrhythmia. Patient had a normal Myoview in January 2020 as part of the cardiac clearance prior to surgery.   Echocardiogram obtained on 09/10/2019 showed EF 60 to 65%, moderately increased left ventricular posterior wall thickness and septal wall thickness, elevated LVEDP.   She has recently had problems with chest pressure and shortness of breath.  This occurs at rest and is not associated with physical activity.  She is sedentary.  She has to vacuum with a small vacuum cleaner, she tries to use a large when she feels exhausted.  She does not have orthopnea, PND or lower extremity edema.  She has not experienced palpitations or syncope.  She tells me that the chest pressure and shortness of breath are new complaints, but she has had these complaints for years.  I do not have her most recent lipid profile, this is followed by Dr. Jeanie Cooks. Autumn Fitzgerald that the labs were checked in January and that he was happy with the results.  She is generally very sensitive to low doses of meds (including beta blockers and diuretics). Although she could not tolerate a full 25 mg daily dose of atenolol due to dizziness and weakness, she feels better on 12.5 mg of atenolol.  It helps prevent palpitations and limits episodes of chest pain.  Had dyspnea off diuretics, but taking torsemide more than 5 mg daily causes weakness.  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)    Hypothyroidism    followed by pcp   LBBB (left bundle branch block)    Mild persistent allergic asthma    Palpitations    Panic attacks    PONV (postoperative nausea and vomiting)    Pre-diabetes    Wears glasses     Past Surgical History:  Procedure Laterality Date   CARDIAC SURGERY  02/05/2007   in [place]  for HOCM   CATARACT EXTRACTION W/ INTRAOCULAR LENS  IMPLANT, BILATERAL  right 07-23-2017;  left 10-15-2017   COLONOSCOPY N/A 11/24/2013   Procedure: COLONOSCOPY;  Surgeon: Juanita Craver, MD;  Location: [facility];  Service: Endoscopy;  Laterality: N/A;   DOPPLER ECHOCARDIOGRAPHY  03/14/2013   EF 65-70%,trivial AI,mild-mod. MR,Mod. TR,mod. concentric hypertrophy   EP IMPLANTABLE DEVICE N/A 08/22/2016   Procedure: Loop Recorder Removal;  Surgeon: Sanda Klein, MD;  Location: [facility];  Service: Cardiovascular;  Laterality: N/A;   ESOPHAGOGASTRODUODENOSCOPY N/A 11/24/2013   Procedure: ESOPHAGOGASTRODUODENOSCOPY (  EGD);  Surgeon: Juanita Craver, MD;  Location: [facility];  Service: Endoscopy;  Laterality: N/A;   GLAUCOMA SURGERY Bilateral 2019   "release pressure   KNEE ARTHROSCOPY Right 10/17/2013   Procedure: ARTHROSCOPY RIGHT KNEE;  Surgeon: [name], MD;  Location: [facility];  Service: Orthopedics;  Laterality: Right;   LAPAROSCOPIC CHOLECYSTECTOMY  02-03-2010   dr d blackman  '@[facility]'$    LOOP RECORDER IMPLANT  03/18/2013   LOOP RECORDER IMPLANT N/A 03/18/2013   Procedure: LOOP RECORDER IMPLANT;  Surgeon: Sanda Klein, MD;  Location: [facility];  Service: Cardiovascular;   Laterality: N/A;   SHOULDER ARTHROSCOPY WITH ROTATOR CUFF REPAIR Right 01/16/2019   Procedure: Right shoulder arthroscopy with subacromial decompression, distal clavicle resection, rotator cuff repair, bicep tenotomy;  Surgeon: Justice Britain, MD;  Location: [facility];  Service: Orthopedics;  Laterality: Right;   TUBAL LIGATION  yrs ago    Current Medications: Current Meds  Medication Sig   albuterol (VENTOLIN HFA) 108 (90 Base) MCG/ACT inhaler Can inhale 2 puffs every 4-6 hours if needed for cough or wheeze.   atenolol (TENORMIN) 25 MG tablet Take 0.5 tablets (12.5 mg total) by mouth daily.   Boswellia-Glucosamine-Vit D (OSTEO BI-FLEX ONE PER DAY PO) Take 1 tablet by mouth.   Cholecalciferol 50 MCG (2000 UT) TABS Take 2,000 Units by mouth daily in the afternoon.   clobetasol cream (TEMOVATE) 6.60 % Apply 1 application. topically 2 (two) times daily.   clonazePAM (KLONOPIN) 1 MG tablet Take 2 mg by mouth at bedtime.    clonazePAM (KLONOPIN) 2 MG tablet Take by mouth.   ezetimibe-simvastatin (VYTORIN) 10-40 MG per tablet Take 1 tablet by mouth every evening.    fluticasone (FLONASE) 50 MCG/ACT nasal spray Place 1 spray into both nostrils daily.   fluticasone (FLOVENT HFA) 44 MCG/ACT inhaler INHALE 2 PUFFS INTO THE LUNGS DAILY WITH SPACER   levothyroxine (SYNTHROID) 50 MCG tablet Take 50 mcg by mouth daily.    Multiple Vitamin (MULTIVITAMIN) tablet Take 1 tablet by mouth daily.   Polyethyl Glycol-Propyl Glycol 0.4-0.3 % SOLN Place one drop in both eyes daily as needed   torsemide (DEMADEX) 5 MG tablet Take 5 mg by mouth daily.   XIIDRA 5 % SOLN Place 1 drop into both eyes 2 (two) times daily.      Allergies:   Amoxicillin-pot clavulanate, Temazepam, Lipitor [atorvastatin calcium], and Penicillin g   Social History   Socioeconomic History   Marital status: Divorced    Spouse name: Not on file   Number of children: 5   Years of education: 10   Highest education level: Not on file   Occupational History   Not on file  Tobacco Use   Smoking status: Never   Smokeless tobacco: Never  Vaping Use   Vaping Use: Never used  Substance and Sexual Activity   Alcohol use: No   Drug use: No   Sexual activity: Never    Birth control/protection: Post-menopausal, Surgical    Comment: divorced  Other Topics Concern   Not on file  Social History Narrative   Lives at home by herself.    Caffeine use: none    Social Determinants of Radio broadcast assistant Strain: Not on file  Food Insecurity: Not on file  Transportation Needs: Not on file  Physical Activity: Not on file  Stress: Not on file  Social Connections: Not on file     Family History: The patient's family history includes Breast cancer in her  mother; Cancer in her brother, father, and mother; Diabetes in her sister; Heart attack in her sister; Hypertension in her mother. There is no history of Migraines.  ROS:   Please see the history of present illness.    All other systems are reviewed and are negative.   EKGs/Labs/Other Studies Reviewed:    The following studies were reviewed today:  Myoview 12/24/2018 The left ventricular ejection fraction is hyperdynamic (>65%). Nuclear stress EF: 80%. There was no ST segment deviation noted during stress. The study is normal. This is a low risk study.   Normal pharmacologic nuclear stress test with no evidence for prior infarct or ischemia. Normal LVEF.   Echo 09/10/2019 IMPRESSIONS      1. Left ventricular ejection fraction, by visual estimation, is 60 to 65%. The left ventricle has normal function. Normal left ventricular size. Left ventricular septal wall thickness was moderately increased. Moderately increased left ventricular  posterior wall thickness. There is moderately increased left ventricular hypertrophy.  2. Elevated left ventricular end-diastolic pressure.  3. Hypertrophic cardiomyopathy.  4. Left ventricular diastolic Doppler parameters are  consistent with impaired relaxation pattern of LV diastolic filling.  5. Mid-cavitary gradient noted with a peak velocity of 1.5 m/s, peak gradient 9 mmHg at rest. Gradient increases to 20 mmHg with Valsalva (2.38ms). There is no significant systolic anterior motion of the mitral valve.  6. Global right ventricle has normal systolic function.The right ventricular size is normal. No increase in right ventricular wall thickness.  7. Left atrial size was normal.  8. Right atrial size was normal.  9. The mitral valve is normal in structure. Trace mitral valve regurgitation. No evidence of mitral stenosis. 10. The tricuspid valve is normal in structure. Tricuspid valve regurgitation is mild. 11. The aortic valve is tricuspid Aortic valve regurgitation was not visualized by color flow Doppler. Structurally normal aortic valve, with no evidence of sclerosis or stenosis. 12. The pulmonic valve was normal in structure. Pulmonic valve regurgitation is trivial by color flow Doppler. 13. Normal pulmonary artery systolic pressure. 14. The inferior vena cava is normal in size with greater than 50% respiratory variability, suggesting right atrial pressure of 3 mmHg.  EKG:  EKG is ordered today.  ECG is unchanged, NSR and LBBB.  Recent Labs: No results found for requested labs within last 365 days.  Recent Lipid Panel    Component Value Date/Time   CHOL 190 01/03/2021 0231   TRIG 305 (H) 01/03/2021 0231   HDL 52 01/03/2021 0231   CHOLHDL 3.7 01/03/2021 0231   VLDL 19 09/23/2008 2026   LDLCALC 97 01/03/2021 0231    Physical Exam:    VS:  BP 116/70 (BP Location: Left Arm, Patient Position: Sitting, Cuff Size: Normal)   Pulse 80   Ht 5' (1.524 m)   Wt 132 lb 3.2 oz (60 kg)   SpO2 97%   BMI 25.82 kg/m     Wt Readings from Last 3 Encounters:  06/22/22 132 lb 3.2 oz (60 kg)  03/27/22 131 lb (59.4 kg)  01/09/22 129 lb (58.5 kg)      General: Alert, oriented x3, no distress, well-healed  sternotomy scar Head: no evidence of trauma, PERRL, EOMI, no exophtalmos or lid lag, no myxedema, no xanthelasma; normal ears, nose and oropharynx Neck: normal jugular venous pulsations and no hepatojugular reflux; brisk carotid pulses without delay and no carotid bruits Chest: clear to auscultation, no signs of consolidation by percussion or palpation, normal fremitus, symmetrical and full respiratory excursions  Cardiovascular: normal position and quality of the apical impulse, regular rhythm, normal first and second heart sounds, no murmurs, rubs or gallops.  Murmurs not elicited with a Valsalva maneuver. Abdomen: no tenderness or distention, no masses by palpation, no abnormal pulsatility or arterial bruits, normal bowel sounds, no hepatosplenomegaly Extremities: no clubbing, cyanosis or edema; 2+ radial, ulnar and brachial pulses bilaterally; 2+ right femoral, posterior tibial and dorsalis pedis pulses; 2+ left femoral, posterior tibial and dorsalis pedis pulses; no subclavian or femoral bruits Neurological: grossly nonfocal Psych: Normal mood and affect     ASSESSMENT:    1. HOCM (hypertrophic obstructive cardiomyopathy) ([place])   2. LBBB (left bundle branch block)   3. Essential hypertension   4. Pure hypercholesterolemia   5. Acquired hypothyroidism      PLAN:    In order of problems listed above:  HOCM s/p septal myectomy: She Fitzgerald that her chest pressure is a new complaint, but she has had it off and on for years, usually associated with periods of increased emotions..  Unfortunate she cannot tolerate higher daily doses of beta-blocker.   No evidence of residual LV outflow tract obstruction on echo, and no murmur heard on physical exam today either at rest or with a Valsalva maneuver.  But it is conceivable she could have some subendocardial ischemia due to LVH.  No evidence of ischemia on recent nuclear stress testing.  Never had documentation of malignant ventricular  arrhythmia.  We will recheck an echocardiogram to look for signs of elevated left heart filling pressures, which could explain both her shortness of breath and chest discomfort. LBBB: Longstanding conduction abnormality.  Relatively narrow QRS. Hypertension: Very well controlled.  Prefer beta-blockers. Hyperlipidemia: Lipids checked and followed by PCP. Hypothyroidism: On levothyroxine.  Also Fitzgerald normal recent TSH.    Medication Adjustments/Labs and Tests Ordered: Current medicines are reviewed at length with the patient today.  Concerns regarding medicines are outlined above.  Orders Placed This Encounter  Procedures   EKG 12-Lead   ECHOCARDIOGRAM COMPLETE    No orders of the defined types were placed in this encounter.    Patient Instructions   Medication Instructions:  No Changes In Medications at this time.   *If you need a refill on your cardiac medications before your next appointment, please call your pharmacy*   Lab Work: None Ordered At This Time.   If you have labs (blood work) drawn today and your tests are completely normal, you will receive your results only by: [facility] (if you have MyChart) OR A paper copy in the mail If you have any lab test that is abnormal or we need to change your treatment, we will call you to review the results.   Testing/Procedures: Your physician has requested that you have an echocardiogram. Echocardiography is a painless test that uses sound waves to create images of your heart. It provides your doctor with information about the size and shape of your heart and how well your heart's chambers and valves are working. You may receive an ultrasound enhancing agent through an IV if needed to better visualize your heart during the echo.This procedure takes approximately one hour. There are no restrictions for this procedure. This will take place at the [address], [address].      Follow-Up: At [facility], you and your  health needs are our priority.  As part of our continuing mission to provide you with exceptional heart care, we have created designated Provider Care Teams.  These Care Teams include your primary Cardiologist (physician) and Advanced Practice Providers (APPs -  Physician Assistants and Nurse Practitioners) who all work together to provide you with the care you need, when you need it.    Your next appointment:   1 year(s)  The format for your next appointment:   In Person  Provider:   Sanda Klein, MD                     Signed, Sanda Klein, MD  06/22/2022 3:12 PM    [place]

## 2022-06-22 NOTE — Patient Instructions (Signed)
Medication Instructions:  No Changes In Medications at this time.   *If you need a refill on your cardiac medications before your next appointment, please call your pharmacy*   Lab Work: None Ordered At This Time.   If you have labs (blood work) drawn today and your tests are completely normal, you will receive your results only by: [place] (if you have MyChart) OR A paper copy in the mail If you have any lab test that is abnormal or we need to change your treatment, we will call you to review the results.   Testing/Procedures: Your physician has requested that you have an echocardiogram. Echocardiography is a painless test that uses sound waves to create images of your heart. It provides your doctor with information about the size and shape of your heart and how well your heart's chambers and valves are working. You may receive an ultrasound enhancing agent through an IV if needed to better visualize your heart during the echo.This procedure takes approximately one hour. There are no restrictions for this procedure. This will take place at the [address], [address].      Follow-Up: At [facility], you and your health needs are our priority.  As part of our continuing mission to provide you with exceptional heart care, we have created designated Provider Care Teams.  These Care Teams include your primary Cardiologist (physician) and Advanced Practice Providers (APPs -  Physician Assistants and Nurse Practitioners) who all work together to provide you with the care you need, when you need it.    Your next appointment:   1 year(s)  The format for your next appointment:   In Person  Provider:   Sanda Klein, MD

## 2022-06-24 ENCOUNTER — Other Ambulatory Visit: Payer: Self-pay | Admitting: Allergy and Immunology

## 2022-06-27 ENCOUNTER — Telehealth: Payer: Self-pay

## 2022-06-27 DIAGNOSIS — F333 Major depressive disorder, recurrent, severe with psychotic symptoms: Secondary | ICD-10-CM | POA: Diagnosis not present

## 2022-06-27 MED ORDER — FLOVENT HFA 44 MCG/ACT IN AERO: INHALATION_SPRAY | RESPIRATORY_TRACT | 0 refills | Status: DC

## 2022-06-27 NOTE — Telephone Encounter (Signed)
Patient called in - DOB/Pharmacy verified - requesting medication refill on Fluticasone Prop (Flovent HFA) 44 mcg/act. Patient advised she must keep 07/18/22 appt for future refills.   Patient verbalized understanding, no further questions.  Electronically sent in medication refill to pharmacy on file.

## 2022-07-06 ENCOUNTER — Ambulatory Visit (HOSPITAL_COMMUNITY): Payer: Medicare HMO | Attending: Cardiology

## 2022-07-06 DIAGNOSIS — I421 Obstructive hypertrophic cardiomyopathy: Secondary | ICD-10-CM | POA: Diagnosis not present

## 2022-07-06 DIAGNOSIS — I071 Rheumatic tricuspid insufficiency: Secondary | ICD-10-CM

## 2022-07-06 HISTORY — DX: Rheumatic tricuspid insufficiency: I07.1

## 2022-07-06 LAB — ECHOCARDIOGRAM COMPLETE
Area-P 1/2: 4.18 cm2
P 1/2 time: 310 msec
S' Lateral: 1.6 cm

## 2022-07-10 NOTE — Progress Notes (Unsigned)
NEUROLOGY FOLLOW UP OFFICE NOTE  MYHA ARIZPE [id number]  Assessment/Plan:   Chronic migraine without aura, without status migrainosus, not intractable Right sided pulsatile tinnitus.  Unclear etiology but has since resolved Cerebral meningioma  Stable and incidental finding   Follow up with [facility] for continued migraine care Repeat MRI in 10 years Follow up as needed       Subjective:  EDMONIA GONSER is a 76 year old female with CHF, HTN, hyperthyroidism, asthma, HLD, depression and anxiety who follows up for headaches.  UPDATE: To further evaluate pulsatile tinnitus, she had CTA head and neck on 02/02/2022 personally reviewed which revealed no arterial stenosis or LVO but calcified right frontal meningioma looked larger compared to prior imaging.  However, MRI of brain with and without contrast on 02/19/2022 revealed that the frontal meningioma was stable compared to prior MRI from 2016 at 9 mm.  However, the tinnitus has since resolved.   Headaches are being treated by Dr. Domingo Cocking at the [facility].   Current NSAIDS/analgesics:  Fioricet, acetaminophen Current triptans:  none Current ergotamine:  none Current anti-emetic:  none Current muscle relaxants:  none Current Antihypertensive medications:  atenolol, torsemide Current Antidepressant medications:  none Current Anticonvulsant medications:  none Current anti-CGRP:  none Current Vitamins/Herbal/Supplements:  MVI, D3 Current Antihistamines/Decongestants:  Flonase Other therapy:  none Hormone/birth control:  none Other medications:  Synthroid  HISTORY:  Onset:  2016.  Initially in back of head and was found to have a meningioma.  Frontal headache started in May 2022. Location:  across forehead and behind eyes Quality:  pressure Intensity:  moderate to severe.   Aura:  absent Prodrome:  absent Associated symptoms:  photophobia, phonophobia, not too much nausea anymore.  She denies  associated visual disturbance, unilateral numbness or weakness. Duration:  all day.   Frequency:  daily Frequency of abortive medication: Tylenol every night.   Triggers/aggravating factors:  talking on phone may aggravate Relieving factors:  laying down in dark, wears sunglasses Activity:  aggravates Takes a Tylenol everynight.     MRI of brain with and without contrast on 10/15/2015 personally reviewed showed 7 mm calcified meningioma outside the right frontal lobe adjacent to the falx cerebri.  Due to worsening headaches, CT head performed on 04/21/2021 personally reviewed revealed meningioma to be stable.   She also has tinnitus worse in the right ear. It is a wooshing/throbbing sound. 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  She has hypothyroidism that is treated with Synthroid    Past NSAIDS/analgesics:  tramadol Past abortive triptans:  none Past abortive ergotamine:  none Past muscle relaxants:  cyclobenzaprine, methocarbamol Past anti-emetic:  ondansetron Past antihypertensive medications:  metoprolol tartrate Past antidepressant medications:  citalopram, fluoxetine Past anticonvulsant medications:  topiramate, Depakote Past anti-CGRP:  none Past vitamins/Herbal/Supplements:  B12 Past antihistamines/decongestants:  Xyzal, Claritin Other past therapies:  trigger point injects  PAST MEDICAL HISTORY: 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  (Patient not taking: Reported on 06/22/2022)     albuterol (VENTOLIN HFA) 108 (90 Base) MCG/ACT inhaler Can inhale 2 puffs every 4-6 hours if needed for cough or wheeze. 1 each 0   atenolol (TENORMIN) 25 MG tablet Take 0.5 tablets (12.5 mg total) by mouth daily. 30 tablet 3   Boswellia-Glucosamine-Vit D (OSTEO BI-FLEX ONE PER DAY PO) Take 1 tablet by mouth.     Cholecalciferol 50 MCG (2000 UT) TABS Take 2,000 Units by mouth daily in the afternoon.     clobetasol cream (TEMOVATE) 1.27 % Apply 1 application. topically 2 (two) times daily. 30 g 5   clonazePAM (KLONOPIN) 1 MG tablet Take 2 mg by mouth at bedtime.      clonazePAM (KLONOPIN) 2 MG tablet Take by mouth.     ezetimibe-simvastatin (VYTORIN) 10-40 MG per tablet Take 1 tablet by mouth every evening.      famotidine (PEPCID) 40 MG tablet Take 1 tablet (40 mg total) by mouth daily. (Patient not taking: Reported on 06/22/2022) 30 tablet 5   FLOVENT HFA 44 MCG/ACT inhaler INHALE 2 PUFFS INTO THE LUNGS DAILY WITH SPACER. 10.6 g 0   fluticasone (FLONASE) 50 MCG/ACT nasal spray Place 1 spray into both nostrils daily. 16 g 4   levothyroxine (SYNTHROID) 50 MCG tablet Take 50 mcg by mouth daily.      Multiple Vitamin (MULTIVITAMIN) tablet Take 1 tablet by mouth daily.     Polyethyl Glycol-Propyl Glycol 0.4-0.3 % SOLN Place one drop in both eyes daily as needed 30 mL 2   torsemide (DEMADEX) 5 MG tablet Take 5 mg by mouth daily.     XIIDRA 5 % SOLN Place 1 drop into both eyes 2 (two) times daily.      No current facility-administered medications on file prior to visit.    ALLERGIES: Allergies  Allergen Reactions   Amoxicillin-Pot Clavulanate  Other (See Comments)    Severe pains and GI symptoms Did it involve swelling of the face/tongue/throat, SOB, or low BP? No Did it involve sudden or severe rash/hives, skin peeling, or any reaction on the inside of your mouth or nose? No Did you need to seek medical attention at a hospital or doctor's office? No When did it last happen?      within the past 10 years If all above answers are "NO", may proceed with cephalosporin use.    Temazepam Other (See Comments)    Dizziness and doesn't work for patient   Lipitor [Atorvastatin Calcium]     Muscle pain   Penicillin G Other (See Comments)    Other reaction(s): Unknown    FAMILY HISTORY: Family History  Problem Relation Age of Onset   Cancer Mother        breast   Hypertension Mother    Breast cancer Mother    Cancer Father        prostate   Cancer Brother    Diabetes Sister    Heart attack Sister  Migraines Neg Hx       Objective:  Blood pressure 121/75, pulse 86, height 5' (1.524 m), SpO2 95 %. General: No acute distress.  Patient appears well-groomed.     Metta Clines, DO  CC: Nolene Ebbs, MD

## 2022-07-11 ENCOUNTER — Other Ambulatory Visit: Payer: Self-pay | Admitting: Allergy and Immunology

## 2022-07-11 ENCOUNTER — Encounter: Payer: Self-pay | Admitting: Neurology

## 2022-07-11 ENCOUNTER — Ambulatory Visit (INDEPENDENT_AMBULATORY_CARE_PROVIDER_SITE_OTHER): Payer: Medicare HMO | Admitting: Neurology

## 2022-07-11 VITALS — BP 121/75 | HR 86 | Ht 60.0 in

## 2022-07-11 DIAGNOSIS — H93A1 Pulsatile tinnitus, right ear: Secondary | ICD-10-CM

## 2022-07-11 DIAGNOSIS — G43709 Chronic migraine without aura, not intractable, without status migrainosus: Secondary | ICD-10-CM | POA: Diagnosis not present

## 2022-07-11 DIAGNOSIS — D320 Benign neoplasm of cerebral meninges: Secondary | ICD-10-CM | POA: Diagnosis not present

## 2022-07-18 ENCOUNTER — Ambulatory Visit (INDEPENDENT_AMBULATORY_CARE_PROVIDER_SITE_OTHER): Payer: Medicare HMO | Admitting: Allergy and Immunology

## 2022-07-18 VITALS — BP 122/64 | HR 89 | Temp 97.7°F | Resp 16 | Ht 60.0 in | Wt 135.8 lb

## 2022-07-18 DIAGNOSIS — K219 Gastro-esophageal reflux disease without esophagitis: Secondary | ICD-10-CM | POA: Diagnosis not present

## 2022-07-18 DIAGNOSIS — J4540 Moderate persistent asthma, uncomplicated: Secondary | ICD-10-CM

## 2022-07-18 DIAGNOSIS — J3089 Other allergic rhinitis: Secondary | ICD-10-CM | POA: Diagnosis not present

## 2022-07-18 MED ORDER — FLOVENT HFA 44 MCG/ACT IN AERO: 2.0000 | INHALATION_SPRAY | Freq: Every day | RESPIRATORY_TRACT | 5 refills | Status: DC

## 2022-07-18 MED ORDER — LANSOPRAZOLE 15 MG PO CPDR: 15.0000 mg | DELAYED_RELEASE_CAPSULE | Freq: Every morning | ORAL | 5 refills | Status: DC

## 2022-07-18 MED ORDER — ALBUTEROL SULFATE HFA 108 (90 BASE) MCG/ACT IN AERS: 2.0000 | INHALATION_SPRAY | RESPIRATORY_TRACT | 1 refills | Status: DC | PRN

## 2022-07-18 MED ORDER — FLUTICASONE PROPIONATE 50 MCG/ACT NA SUSP: 1.0000 | Freq: Every day | NASAL | 5 refills | Status: DC

## 2022-07-18 NOTE — Progress Notes (Unsigned)
[place] [facility] [place] [facility] [place]   Follow-up Note  Referring Provider: Nolene Ebbs, MD Primary Provider: Nolene Ebbs, MD Date of Office Visit: 07/18/2022  Subjective:   Autumn Fitzgerald (DOB: Apr 19, 1946) is a 76 y.o. female who returns to the [facility] on 07/18/2022 in re-evaluation of the following:  HPI: Autumn Fitzgerald returns to this clinic in evaluation of asthma, allergic rhinitis, LPR.  Her last visit to this clinic was 10 January 2022.  It sounds as though she has had a good interval time regarding control of her airway issue and control of her reflux issue.  She has not required a systemic steroid or antibiotic for any type of airway issue.  She rarely uses the short acting bronchodilator.  She continues to use Flovent now averaging out about 3 times per week.  She does not use any nasal steroid at this point as she has no upper airway symptoms.  Her reflux is under good control while using over-the-counter PPI.  [facility] and is receiving injections of Decadron/lidocaine/Marcaine in her scalp which is working quite well for her headaches.  She had an MRI of her head which identified a stable meningioma without any growth in 7 years.  Allergies as of 07/18/2022       Reactions   Amoxicillin-pot Clavulanate Other (See Comments)   Severe pains and GI symptoms Did it involve swelling of the face/tongue/throat, SOB, or low BP? No Did it involve sudden or severe rash/hives, skin peeling, or any reaction on the inside of your mouth or nose? No Did you need to seek medical attention at a hospital or doctor's office? No When did it last happen?      within the past 10 years If all above answers are "NO", may proceed with cephalosporin use.   Temazepam Other (See Comments)   Dizziness and doesn't work for patient   Lipitor [atorvastatin Calcium]    Muscle pain   Penicillin G Other (See Comments)   Other  reaction(s): Unknown        Medication List    acetaminophen 500 MG tablet Commonly known as: TYLENOL Take 500 mg by mouth every 6 (six) hours as needed for moderate pain or headache.   albuterol 108 (90 Base) MCG/ACT inhaler Commonly known as: VENTOLIN HFA INHALE TWO PUFFS BY MOUTH EVERY 4 TO 6 HOURS INTO LUNGS AS NEEDED FOR COUGH OR WHEEZE   atenolol 25 MG tablet Commonly known as: TENORMIN Take 0.5 tablets (12.5 mg total) by mouth daily.   Cholecalciferol 50 MCG (2000 UT) Tabs Take 2,000 Units by mouth daily in the afternoon.   clonazePAM 2 MG tablet Commonly known as: KLONOPIN Take by mouth.   ezetimibe-simvastatin 10-40 MG tablet Commonly known as: VYTORIN Take 1 tablet by mouth every evening.   Flovent HFA 44 MCG/ACT inhaler Generic drug: fluticasone INHALE 2 PUFFS INTO THE LUNGS DAILY WITH SPACER.   fluticasone 50 MCG/ACT nasal spray Commonly known as: Flonase Place 1 spray into both nostrils daily.   levothyroxine 50 MCG tablet Commonly known as: SYNTHROID Take 50 mcg by mouth daily.   multivitamin tablet Take 1 tablet by mouth daily.   OSTEO BI-FLEX ONE PER DAY PO Take 1 tablet by mouth.   Polyethyl Glycol-Propyl Glycol 0.4-0.3 % Soln Place one drop in both eyes daily as needed   torsemide 5 MG tablet Commonly known as: DEMADEX Take 5 mg by mouth daily.  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)    Hypothyroidism    followed by pcp   LBBB (left bundle branch block)    Mild persistent allergic asthma    Palpitations    Panic attacks    PONV (postoperative nausea and vomiting)     Pre-diabetes    Wears glasses     Past Surgical History:  Procedure Laterality Date   CARDIAC SURGERY  02/05/2007   in [place]  for HOCM   CATARACT EXTRACTION W/ INTRAOCULAR LENS  IMPLANT, BILATERAL  right 07-23-2017;  left 10-15-2017   COLONOSCOPY N/A 11/24/2013   Procedure: COLONOSCOPY;  Surgeon: Juanita Craver, MD;  Location: [facility];  Service: Endoscopy;  Laterality: N/A;   DOPPLER ECHOCARDIOGRAPHY  03/14/2013   EF 65-70%,trivial AI,mild-mod. MR,Mod. TR,mod. concentric hypertrophy   EP IMPLANTABLE DEVICE N/A 08/22/2016   Procedure: Loop Recorder Removal;  Surgeon: Sanda Klein, MD;  Location: [facility];  Service: Cardiovascular;  Laterality: N/A;   ESOPHAGOGASTRODUODENOSCOPY N/A 11/24/2013   Procedure: ESOPHAGOGASTRODUODENOSCOPY (EGD);  Surgeon: Juanita Craver, MD;  Location: [facility];  Service: Endoscopy;  Laterality: N/A;   GLAUCOMA SURGERY Bilateral 2019   "release pressure   KNEE ARTHROSCOPY Right 10/17/2013   Procedure: ARTHROSCOPY RIGHT KNEE;  Surgeon: [name], MD;  Location: [facility];  Service: Orthopedics;  Laterality: Right;   LAPAROSCOPIC CHOLECYSTECTOMY  02-03-2010   dr d blackman  '@[facility]'$    LOOP RECORDER IMPLANT  03/18/2013   LOOP RECORDER IMPLANT N/A 03/18/2013   Procedure: LOOP RECORDER IMPLANT;  Surgeon: Sanda Klein, MD;  Location: [facility];  Service: Cardiovascular;  Laterality: N/A;   SHOULDER ARTHROSCOPY WITH ROTATOR CUFF REPAIR Right 01/16/2019   Procedure: Right shoulder arthroscopy with subacromial decompression, distal clavicle resection, rotator cuff repair, bicep tenotomy;  Surgeon: Justice Britain, MD;  Location: [facility];  Service: Orthopedics;  Laterality: Right;   TUBAL LIGATION  yrs ago    Review of systems negative except as noted in HPI / PMHx or noted below:  Review of Systems  Constitutional: Negative.   HENT: Negative.    Eyes: Negative.   Respiratory: Negative.    Cardiovascular: Negative.   Gastrointestinal:  Negative.   Genitourinary: Negative.   Musculoskeletal: Negative.   Skin: Negative.   Neurological: Negative.   Endo/Heme/Allergies: Negative.   Psychiatric/Behavioral: Negative.       Objective:   Vitals:   07/18/22 1328  BP: 122/64  Pulse: 89  Resp: 16  Temp: 97.7 F (36.5 C)  SpO2: 95%   Height: 5' (152.4 cm)  Weight: 135 lb 12.8 oz (61.6 kg)   Physical Exam Constitutional:      Appearance: She is not diaphoretic.  HENT:     Head: Normocephalic.     Right Ear: Tympanic membrane, ear canal and external ear normal.     Left Ear: Tympanic membrane, ear canal and external ear normal.     Nose: Nose normal. No mucosal edema or rhinorrhea.     Mouth/Throat:     Pharynx: Uvula  midline. No oropharyngeal exudate.  Eyes:     Conjunctiva/sclera: Conjunctivae normal.  Neck:     Thyroid: No thyromegaly.     Trachea: Trachea normal. No tracheal tenderness or tracheal deviation.  Cardiovascular:     Rate and Rhythm: Normal rate and regular rhythm.     Heart sounds: Normal heart sounds, S1 normal and S2 normal. No murmur heard. Pulmonary:     Effort: No respiratory distress.     Breath sounds: Normal breath sounds. No stridor. No wheezing or rales.  Lymphadenopathy:     Head:     Right side of head: No tonsillar adenopathy.     Left side of head: No tonsillar adenopathy.     Cervical: No cervical adenopathy.  Skin:    Findings: No erythema or rash.     Nails: There is no clubbing.  Neurological:     Mental Status: She is alert.     Diagnostics:    Spirometry was performed and demonstrated an FEV1 of 1.24 at 76 % of predicted.   Assessment and Plan:   1. Asthma, moderate persistent, well-controlled   2. Other allergic rhinitis   3. LPRD (laryngopharyngeal reflux disease)      1.  Continue Flovent 44 - 2 inhalations 3-7 times per week   2.  Continue Flonase - 1 spray each nostril 1-7 times per week  3.  Continue lansoprazole 15 mg in a.m.    4.  Can use  nasal saline and systane/ refresh eye drops if needed.  5.  Can use albuterol HFA 2 puffs every 4-6 hours if needed.  6. Can increase Flovent and Flonase to 2 times per day during 'Flare Up"   7. Return to clinic in 6 months or earlier if problem  8. Obtain fall flu vaccine and RSV vaccine  Autumn Fitzgerald is really doing quite well and she will continue to use a relatively low-dose of anti-inflammatory agents for airway and therapy directed against reflux as noted above and assuming she does well with this plan I will see her back in this clinic in 6 months or earlier if there is a problem.           Allena Katz, MD Allergy / Immunology [place]

## 2022-07-18 NOTE — Patient Instructions (Addendum)
  1.  Continue Flovent 44 - 2 inhalations 3-7 times per week   2.  Continue Flonase - 1 spray each nostril 1-7 times per week  3.  Continue lansoprazole 15 mg in a.m.    4.  Can use nasal saline and systane/ refresh eye drops if needed.  5.  Can use albuterol HFA 2 puffs every 4-6 hours if needed.  6. Can increase Flovent and Flonase to 2 times per day during 'Flare Up"   7. Return to clinic in 6 months or earlier if problem  8. Obtain fall flu vaccine and RSV vaccine

## 2022-07-19 ENCOUNTER — Encounter: Payer: Self-pay | Admitting: Allergy and Immunology

## 2022-07-20 DIAGNOSIS — F333 Major depressive disorder, recurrent, severe with psychotic symptoms: Secondary | ICD-10-CM | POA: Diagnosis not present

## 2022-07-21 ENCOUNTER — Other Ambulatory Visit: Payer: Self-pay

## 2022-07-21 DIAGNOSIS — J4540 Moderate persistent asthma, uncomplicated: Secondary | ICD-10-CM

## 2022-07-26 DIAGNOSIS — G518 Other disorders of facial nerve: Secondary | ICD-10-CM | POA: Diagnosis not present

## 2022-07-26 DIAGNOSIS — M791 Myalgia, unspecified site: Secondary | ICD-10-CM | POA: Diagnosis not present

## 2022-07-26 DIAGNOSIS — G43719 Chronic migraine without aura, intractable, without status migrainosus: Secondary | ICD-10-CM | POA: Diagnosis not present

## 2022-07-26 DIAGNOSIS — M542 Cervicalgia: Secondary | ICD-10-CM | POA: Diagnosis not present

## 2022-07-29 ENCOUNTER — Emergency Department (HOSPITAL_BASED_OUTPATIENT_CLINIC_OR_DEPARTMENT_OTHER): Payer: Medicare HMO

## 2022-07-29 ENCOUNTER — Emergency Department (HOSPITAL_BASED_OUTPATIENT_CLINIC_OR_DEPARTMENT_OTHER): Payer: Medicare HMO | Admitting: Radiology

## 2022-07-29 ENCOUNTER — Other Ambulatory Visit: Payer: Self-pay

## 2022-07-29 ENCOUNTER — Emergency Department (HOSPITAL_BASED_OUTPATIENT_CLINIC_OR_DEPARTMENT_OTHER)
Admission: EM | Admit: 2022-07-29 | Discharge: 2022-07-29 | Disposition: A | Discharge status: Home / Self Care | Payer: Medicare HMO | Attending: Emergency Medicine | Admitting: Emergency Medicine

## 2022-07-29 DIAGNOSIS — R0600 Dyspnea, unspecified: Secondary | ICD-10-CM | POA: Diagnosis not present

## 2022-07-29 DIAGNOSIS — R0602 Shortness of breath: Secondary | ICD-10-CM | POA: Insufficient documentation

## 2022-07-29 DIAGNOSIS — R079 Chest pain, unspecified: Secondary | ICD-10-CM | POA: Insufficient documentation

## 2022-07-29 DIAGNOSIS — R42 Dizziness and giddiness: Secondary | ICD-10-CM | POA: Diagnosis not present

## 2022-07-29 DIAGNOSIS — R0789 Other chest pain: Secondary | ICD-10-CM | POA: Diagnosis not present

## 2022-07-29 DIAGNOSIS — R5383 Other fatigue: Secondary | ICD-10-CM | POA: Diagnosis not present

## 2022-07-29 DIAGNOSIS — J45909 Unspecified asthma, uncomplicated: Secondary | ICD-10-CM | POA: Insufficient documentation

## 2022-07-29 DIAGNOSIS — I10 Essential (primary) hypertension: Secondary | ICD-10-CM | POA: Insufficient documentation

## 2022-07-29 LAB — CBC
HCT: 42.6 % (ref 36.0–46.0)
Hemoglobin: 14.2 g/dL (ref 12.0–15.0)
MCH: 28.8 pg (ref 26.0–34.0)
MCHC: 33.3 g/dL (ref 30.0–36.0)
MCV: 86.4 fL (ref 80.0–100.0)
Platelets: 276 10*3/uL (ref 150–400)
RBC: 4.93 MIL/uL (ref 3.87–5.11)
RDW: 13.4 % (ref 11.5–15.5)
WBC: 7.4 10*3/uL (ref 4.0–10.5)
nRBC: 0 % (ref 0.0–0.2)

## 2022-07-29 LAB — TROPONIN I (HIGH SENSITIVITY)
Troponin I (High Sensitivity): 13 ng/L (ref <18)
Troponin I (High Sensitivity): 15 ng/L (ref <18)

## 2022-07-29 LAB — BASIC METABOLIC PANEL
Anion gap: 12 (ref 5–15)
BUN: 11 mg/dL (ref 8–23)
CO2: 23 mmol/L (ref 22–32)
Calcium: 9.1 mg/dL (ref 8.9–10.3)
Chloride: 102 mmol/L (ref 98–111)
Creatinine, Ser: 0.73 mg/dL (ref 0.44–1.00)
GFR, Estimated: >60 mL/min (ref >60)
Glucose, Bld: 130 mg/dL — ABNORMAL HIGH (ref 70–99)
Potassium: 3.6 mmol/L (ref 3.5–5.1)
Sodium: 137 mmol/L (ref 135–145)

## 2022-07-29 LAB — D-DIMER, QUANTITATIVE: D-Dimer, Quant: 0.81 ug/mL-FEU — ABNORMAL HIGH (ref 0.00–0.50)

## 2022-07-29 MED ORDER — IOHEXOL 350 MG/ML SOLN
100.0000 mL | Freq: Once | INTRAVENOUS | Status: AC | PRN
Start: 2022-07-29 — End: 2022-07-29
  Administered 2022-07-29: 100 mL via INTRAVENOUS

## 2022-07-29 NOTE — ED Provider Notes (Addendum)
[facility] EMERGENCY DEPT Provider Note   CSN: [id number] Arrival date & time: 07/29/22  1426     History  Chief Complaint  Patient presents with   Fatigue   Shortness of Breath    Autumn Fitzgerald is a 76 y.o. female.   Shortness of Breath  Patient has history of anxiety, hyperlipidemia, asthma Hypertension, palpitations, cardiomyopathy, depression, migraines.  Patient states she had injections in her head to treat her migraine headaches.  This was on Wednesday.  The injections were combination of lidocaine and steroids.  Patient states following that procedure she has been having some trouble with feeling dizzy and fatigued she is also felt short of breath.  She has discomfort in her chest and her upper abdomen.  She has not had any trouble with vomiting.  She has not had any fevers or coughing.  She denies any leg swelling.    Home Medications Prior to Admission medications   Medication Sig Start Date End Date Taking? Authorizing Provider  acetaminophen (TYLENOL) 500 MG tablet Take 500 mg by mouth every 6 (six) hours as needed for moderate pain or headache.    [provider]  albuterol (VENTOLIN HFA) 108 (90 Base) MCG/ACT inhaler Inhale 2 puffs into the lungs every 4 (four) hours as needed for wheezing or shortness of breath. INHALE TWO PUFFS BY MOUTH EVERY 4 TO 6 HOURS INTO LUNGS AS NEEDED FOR COUGH OR WHEEZE 07/18/22   Kozlow, Donnamarie Poag, MD  atenolol (TENORMIN) 25 MG tablet Take 0.5 tablets (12.5 mg total) by mouth daily. 12/03/19   Croitoru, Mihai, MD  Boswellia-Glucosamine-Vit D (OSTEO BI-FLEX ONE PER DAY PO) Take 1 tablet by mouth.    [provider]  Cholecalciferol 50 MCG (2000 UT) TABS Take 2,000 Units by mouth daily in the afternoon.    [provider]  clonazePAM (KLONOPIN) 2 MG tablet Take by mouth. 01/09/22   [provider]  ezetimibe-simvastatin (VYTORIN) 10-40 MG per tablet Take 1 tablet by mouth every evening.      [provider]  FLOVENT HFA 44 MCG/ACT inhaler Inhale 2 puffs into the lungs daily. INHALE 2 PUFFS INTO THE LUNGS DAILY WITH SPACER. 07/18/22   Kozlow, Donnamarie Poag, MD  fluticasone (FLONASE) 50 MCG/ACT nasal spray Place 1 spray into both nostrils daily. 07/18/22   Kozlow, Donnamarie Poag, MD  lansoprazole (PREVACID) 15 MG capsule Take 1 capsule (15 mg total) by mouth in the morning. 07/18/22   Kozlow, Donnamarie Poag, MD  levothyroxine (SYNTHROID) 50 MCG tablet Take 50 mcg by mouth daily.  06/15/14   [provider]  Multiple Vitamin (MULTIVITAMIN) tablet Take 1 tablet by mouth daily.    [provider]  Polyethyl Glycol-Propyl Glycol 0.4-0.3 % SOLN Place one drop in both eyes daily as needed 01/04/21   Kozlow, Donnamarie Poag, MD  torsemide (DEMADEX) 5 MG tablet Take 5 mg by mouth daily.    [provider]      Allergies    Amoxicillin-pot clavulanate, Temazepam, Lipitor [atorvastatin calcium], and Penicillin g    Review of Systems   Review of Systems  Respiratory:  Positive for shortness of breath.     Physical Exam Updated Vital Signs BP (!) 155/90 (BP Location: Right Arm)   Pulse 79   Temp 98.3 F (36.8 C) (Oral)   Resp 18   Ht 1.524 m (5')   Wt 59 kg   SpO2 100%   BMI 25.39 kg/m  Physical Exam Vitals and nursing note  reviewed.  Constitutional:      Appearance: She is well-developed. She is not diaphoretic.  HENT:     Head: Normocephalic and atraumatic.     Right Ear: External ear normal.     Left Ear: External ear normal.  Eyes:     General: No scleral icterus.       Right eye: No discharge.        Left eye: No discharge.     Conjunctiva/sclera: Conjunctivae normal.  Neck:     Trachea: No tracheal deviation.  Cardiovascular:     Rate and Rhythm: Normal rate and regular rhythm.  Pulmonary:     Effort: Pulmonary effort is normal. No respiratory distress.     Breath sounds: Normal breath sounds. No stridor. No wheezing or rales.  Abdominal:     General: Bowel sounds  are normal. There is no distension.     Palpations: Abdomen is soft.     Tenderness: There is no abdominal tenderness. There is no guarding or rebound.  Musculoskeletal:        General: No tenderness or deformity.     Cervical back: Neck supple.     Right lower leg: No edema.     Left lower leg: No edema.  Skin:    General: Skin is warm and dry.     Findings: No rash.  Neurological:     General: No focal deficit present.     Mental Status: She is alert.     Cranial Nerves: No cranial nerve deficit (no facial droop, extraocular movements intact, no slurred speech).     Sensory: No sensory deficit.     Motor: No abnormal muscle tone or seizure activity.     Coordination: Coordination normal.     Comments: No facial droop, normal strength sensation bilateral upper extremity and lower extremities  Psychiatric:        Mood and Affect: Mood normal.     ED Results / Procedures / Treatments   Labs (all labs ordered are listed, but only abnormal results are displayed) Labs Reviewed  BASIC METABOLIC PANEL - Abnormal; Notable for the following components:      Result Value   Glucose, Bld 130 (*)    All other components within normal limits  D-DIMER, QUANTITATIVE - Abnormal; Notable for the following components:   D-Dimer, Quant 0.81 (*)    All other components within normal limits  SARS CORONAVIRUS 2 BY RT PCR  CBC  TROPONIN I (HIGH SENSITIVITY)  TROPONIN I (HIGH SENSITIVITY)    EKG EKG Interpretation  Date/Time:  Saturday July 29 2022 14:43:33 EDT Ventricular Rate:  84 PR Interval:  128 QRS Duration: 122 QT Interval:  384 QTC Calculation: 453 R Axis:   89 Text Interpretation: Normal sinus rhythm Non-specific intra-ventricular conduction delay Nonspecific ST and T wave abnormality Abnormal ECG When compared with ECG of 21-Apr-2021 15:08, Premature ventricular complexes are no longer Present T wave inversion less evident in Inferior leads Confirmed by Dorie Rank [phone]) on  07/29/2022 3:27:21 PM  Radiology CT Angio Chest PE W and/or Wo Contrast  Result Date: 07/29/2022 CLINICAL DATA:  Pulmonary embolism (PE) suspected, high prob. Dyspnea, fatigue, dizziness, elevated D-dimer. EXAM: CT ANGIOGRAPHY CHEST WITH CONTRAST TECHNIQUE: Multidetector CT imaging of the chest was performed using the standard protocol during bolus administration of intravenous contrast. Multiplanar CT image reconstructions and MIPs were obtained to evaluate the vascular anatomy. RADIATION DOSE REDUCTION: This exam was performed according to the departmental dose-optimization program which  includes automated exposure control, adjustment of the mA and/or kV according to patient size and/or use of iterative reconstruction technique. CONTRAST:  62 mL OMNIPAQUE IOHEXOL 350 MG/ML SOLN COMPARISON:  07/14/2012 FINDINGS: Cardiovascular: Adequate opacification of the pulmonary arterial tree. No intraluminal filling defect identified to suggest acute pulmonary embolism. Central pulmonary arteries are of normal caliber. Mild coronary artery calcification. Cardiac size within normal limits. No pericardial effusion. Mild atherosclerotic calcification within the thoracic aorta. Postsurgical changes related to median sternotomy and aortotomy are again identified. No aortic aneurysm. Mediastinum/Nodes: No enlarged mediastinal, hilar, or axillary lymph nodes. Thyroid gland, trachea, and esophagus demonstrate no significant findings. Lungs/Pleura: Lungs are clear. No pleural effusion or pneumothorax. Upper Abdomen: No acute abnormality. Musculoskeletal: No chest wall abnormality. No acute or significant osseous findings. Review of the MIP images confirms the above findings. IMPRESSION: 1. No pulmonary embolism. No acute intrathoracic pathology identified. 2. Mild coronary artery calcification. Electronically Signed   By: Fidela Salisbury M.D.   On: 07/29/2022 20:05   DG Chest 2 View  Result Date: 07/29/2022 CLINICAL DATA:   Shortness of breath, dizziness EXAM: CHEST - 2 VIEW COMPARISON:  02/24/2020 FINDINGS: The heart size and mediastinal contours are within normal limits. Both lungs are clear. Metallic sutures are seen in sternum, possibly suggesting previous cardiac surgery. IMPRESSION: No active cardiopulmonary disease. Electronically Signed   By: Elmer Picker M.D.   On: 07/29/2022 15:22    Procedures Procedures    Medications Ordered in ED Medications  iohexol (OMNIPAQUE) 350 MG/ML injection 100 mL (100 mLs Intravenous Contrast Given 07/29/22 1945)    ED Course/ Medical Decision Making/ A&P Clinical Course as of 07/29/22 2034  Sat Jul 29, 2022  1546 CBC Normal [JK]  0626 Basic metabolic panel(!) Normal except for hyperglycemia [JK]  1547 DG Chest 2 View Chest x-ray without acute findings [JK]  9485 Basic metabolic panel(!) [JK]  4627 CT Angio Chest PE W and/or Wo Contrast Chest CT without signs of acute PE.  Mild coronary calcifications noted [JK]    Clinical Course User Index [JK] Dorie Rank, MD                           Medical Decision Making Problems Addressed: Chest pain, unspecified type: acute illness or injury that poses a threat to life or bodily functions  Amount and/or Complexity of Data Reviewed Labs: ordered. Decision-making details documented in ED Course. Radiology: ordered. Decision-making details documented in ED Course.  Risk Prescription drug management.   Patient presented to the ED for evaluation of fatigue shortness of breath.  Due to the possibility of pneumonia, CHF exacerbation, PE, COVID virus infection chest x-ray without signs of pneumonia.  CBC metabolic panel were unremarkable.  Serial troponins were normal.  I doubt ACS.  D-dimer was slightly elevated so a CT angiogram was performed.  CT negative for PE.  Consider the possibility of COVID virus infection.  Patient declined testing.  Evaluation and diagnostic testing in the emergency department does not  suggest an emergent condition requiring admission or immediate intervention beyond what has been performed at this time.  The patient is safe for discharge and has been instructed to return immediately for worsening symptoms, change in symptoms or any other concerns.         Final Clinical Impression(s) / ED Diagnoses Final diagnoses:  Chest pain, unspecified type    Rx / DC Orders ED Discharge Orders     None  Dorie Rank, MD 07/29/22 2034    Dorie Rank, MD 07/29/22 2034

## 2022-07-29 NOTE — ED Notes (Signed)
Pt refusing Covid swab at this time.

## 2022-07-29 NOTE — ED Triage Notes (Signed)
Patient arrives with complaints of dizziness, fatigue, and shortness of breath.  Patient reports CHF. Currently getting injections for migraines. Last one was 3 days ago.

## 2022-07-29 NOTE — Discharge Instructions (Addendum)
The test today in the ED were reassuring.  No signs of heart attack.  No signs of blood clot in the lungs.  Follow-up with your doctor next week to be rechecked

## 2022-08-01 DIAGNOSIS — J4520 Mild intermittent asthma, uncomplicated: Secondary | ICD-10-CM | POA: Diagnosis not present

## 2022-08-01 DIAGNOSIS — I10 Essential (primary) hypertension: Secondary | ICD-10-CM | POA: Diagnosis not present

## 2022-08-01 DIAGNOSIS — I2510 Atherosclerotic heart disease of native coronary artery without angina pectoris: Secondary | ICD-10-CM | POA: Diagnosis not present

## 2022-08-01 DIAGNOSIS — K219 Gastro-esophageal reflux disease without esophagitis: Secondary | ICD-10-CM | POA: Diagnosis not present

## 2022-08-11 DIAGNOSIS — F333 Major depressive disorder, recurrent, severe with psychotic symptoms: Secondary | ICD-10-CM | POA: Diagnosis not present

## 2022-08-30 DIAGNOSIS — M542 Cervicalgia: Secondary | ICD-10-CM | POA: Diagnosis not present

## 2022-08-30 DIAGNOSIS — G518 Other disorders of facial nerve: Secondary | ICD-10-CM | POA: Diagnosis not present

## 2022-08-30 DIAGNOSIS — G43719 Chronic migraine without aura, intractable, without status migrainosus: Secondary | ICD-10-CM | POA: Diagnosis not present

## 2022-08-30 DIAGNOSIS — M791 Myalgia, unspecified site: Secondary | ICD-10-CM | POA: Diagnosis not present

## 2022-09-14 DIAGNOSIS — F333 Major depressive disorder, recurrent, severe with psychotic symptoms: Secondary | ICD-10-CM | POA: Diagnosis not present

## 2022-09-18 DIAGNOSIS — Z1231 Encounter for screening mammogram for malignant neoplasm of breast: Secondary | ICD-10-CM | POA: Diagnosis not present

## 2022-09-21 DIAGNOSIS — M25562 Pain in left knee: Secondary | ICD-10-CM | POA: Diagnosis not present

## 2022-09-21 DIAGNOSIS — S83242A Other tear of medial meniscus, current injury, left knee, initial encounter: Secondary | ICD-10-CM | POA: Diagnosis not present

## 2022-09-22 ENCOUNTER — Telehealth: Payer: Self-pay | Admitting: *Deleted

## 2022-09-22 NOTE — Telephone Encounter (Signed)
   Pre-operative Risk Assessment    Patient Name: Autumn Fitzgerald  DOB: May 14, 1946 MRN: [id number]      Request for Surgical Clearance    Procedure:   LEFT KNEE SCOPE AND MENISCUS DEBRIDEMENT  Date of Surgery:  Clearance TBD                                 Surgeon:  DR. Esmond Plants Surgeon's Group or Practice Name:  [facility] Phone number:  [phone] Fax number:  [phone] ATTN: MEGAN DAVIS   Type of Clearance Requested:   - Medical ; NO MEDICATIONS LISTED AS NEEDING TO BE HELD   Type of Anesthesia:   CHOICE   Additional requests/questions:    Jiles Prows   09/22/2022, 5:54 PM

## 2022-09-25 ENCOUNTER — Telehealth: Payer: Self-pay | Admitting: *Deleted

## 2022-09-25 NOTE — Telephone Encounter (Signed)
   Name: Autumn Fitzgerald  DOB: 11/06/1946  MRN: [id number]  Primary Cardiologist: Sanda Klein, MD   Preoperative team, please contact this patient and set up a phone call appointment for further preoperative risk assessment. Please obtain consent and complete medication review. Thank you for your help.  I confirm that guidance regarding antiplatelet and oral anticoagulation therapy has been completed and, if necessary, noted below (none requested).    [name], NP 09/25/2022, 5:10 PM [place]

## 2022-09-25 NOTE — Telephone Encounter (Signed)
Pt agreeable to plan of care for tele pre op appt 09/29/22 @ 3 pm. Med rec and consent are done.

## 2022-09-25 NOTE — Telephone Encounter (Signed)
Pt agreeable to plan of care for tele pre op appt 09/29/22 @ 3 pm. Med rec and consent are done.     Patient Consent for Virtual Visit        Autumn Fitzgerald has provided verbal consent on 09/25/2022 for a virtual visit (video or telephone).   CONSENT FOR VIRTUAL VISIT FOR:  Autumn Fitzgerald  By participating in this virtual visit I agree to the following:  I hereby voluntarily request, consent and authorize [facility] and its employed or contracted physicians, physician assistants, nurse practitioners or other licensed health care professionals (the Practitioner), to provide me with telemedicine health care services (the "Services") as deemed necessary by the treating Practitioner. I acknowledge and consent to receive the Services by the Practitioner via telemedicine. I understand that the telemedicine visit will involve communicating with the Practitioner through live audiovisual communication technology and the disclosure of certain medical information by electronic transmission. I acknowledge that I have been given the opportunity to request an in-person assessment or other available alternative prior to the telemedicine visit and am voluntarily participating in the telemedicine visit.  I understand that I have the right to withhold or withdraw my consent to the use of telemedicine in the course of my care at any time, without affecting my right to future care or treatment, and that the Practitioner or I may terminate the telemedicine visit at any time. I understand that I have the right to inspect all information obtained and/or recorded in the course of the telemedicine visit and may receive copies of available information for a reasonable fee.  I understand that some of the potential risks of receiving the Services via telemedicine include:  Delay or interruption in medical evaluation due to technological equipment failure or disruption; Information transmitted may not be  sufficient (e.g. poor resolution of images) to allow for appropriate medical decision making by the Practitioner; and/or  In rare instances, security protocols could fail, causing a breach of personal health information.  Furthermore, I acknowledge that it is my responsibility to provide information about my medical history, conditions and care that is complete and accurate to the best of my ability. I acknowledge that Practitioner's advice, recommendations, and/or decision may be based on factors not within their control, such as incomplete or inaccurate data provided by me or distortions of diagnostic images or specimens that may result from electronic transmissions. I understand that the practice of medicine is not an exact science and that Practitioner makes no warranties or guarantees regarding treatment outcomes. I acknowledge that a copy of this consent can be made available to me via my patient portal ([facility]), or I can request a printed copy by calling the office of [facility].    I understand that my insurance will be billed for this visit.   I have read or had this consent read to me. I understand the contents of this consent, which adequately explains the benefits and risks of the Services being provided via telemedicine.  I have been provided ample opportunity to ask questions regarding this consent and the Services and have had my questions answered to my satisfaction. I give my informed consent for the services to be provided through the use of telemedicine in my medical care

## 2022-09-26 ENCOUNTER — Encounter: Payer: Self-pay | Admitting: Obstetrics and Gynecology

## 2022-09-26 ENCOUNTER — Ambulatory Visit (INDEPENDENT_AMBULATORY_CARE_PROVIDER_SITE_OTHER): Payer: Medicare HMO | Admitting: Obstetrics and Gynecology

## 2022-09-26 VITALS — BP 117/75 | HR 70 | Ht 60.0 in | Wt 132.0 lb

## 2022-09-26 DIAGNOSIS — L900 Lichen sclerosus et atrophicus: Secondary | ICD-10-CM | POA: Diagnosis not present

## 2022-09-26 DIAGNOSIS — Z1231 Encounter for screening mammogram for malignant neoplasm of breast: Secondary | ICD-10-CM | POA: Diagnosis not present

## 2022-09-26 DIAGNOSIS — R8290 Unspecified abnormal findings in urine: Secondary | ICD-10-CM

## 2022-09-26 LAB — POCT URINALYSIS DIPSTICK
Appearance: NORMAL
Bilirubin, UA: NEGATIVE
Blood, UA: NEGATIVE
Glucose, UA: NEGATIVE
Leukocytes, UA: NEGATIVE
Nitrite, UA: NEGATIVE
Odor: NORMAL
Protein, UA: NEGATIVE
Spec Grav, UA: <=1.005 — AB (ref 1.010–1.025)
Urobilinogen, UA: 0.2 E.U./dL
pH, UA: 5 (ref 5.0–8.0)

## 2022-09-26 NOTE — Progress Notes (Signed)
76 yo P4 with BMI 25, postmenopausal who returns for follow up on lichen sclerosis. Patient reports applying clobetasol cream 3 times a week. She denies any vulva pruritus. She is not sexually active. She denies any episodes of postmenopausal vaginal bleeding. She denies urinary incontinence. She is being followed by a cardiologist for cardiomyopathy and a neurologist for migraines. Patient is without any new complaints.   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)    Hypothyroidism    followed by pcp   LBBB (left bundle branch block)    Mild persistent allergic asthma    Palpitations    Panic attacks    PONV (postoperative nausea and vomiting)    Pre-diabetes    Wears glasses    Past Surgical History:  Procedure Laterality Date   CARDIAC SURGERY  02/05/2007   in [place]  for HOCM   CATARACT EXTRACTION W/ INTRAOCULAR LENS  IMPLANT, BILATERAL  right 07-23-2017;  left 10-15-2017   COLONOSCOPY N/A 11/24/2013   Procedure: COLONOSCOPY;  Surgeon: Juanita Craver, MD;  Location: [facility];  Service: Endoscopy;  Laterality: N/A;   DOPPLER ECHOCARDIOGRAPHY  03/14/2013   EF 65-70%,trivial AI,mild-mod. MR,Mod. TR,mod. concentric hypertrophy   EP IMPLANTABLE DEVICE N/A 08/22/2016   Procedure: Loop Recorder Removal;  Surgeon: Sanda Klein, MD;  Location: [facility];  Service: Cardiovascular;  Laterality: N/A;   ESOPHAGOGASTRODUODENOSCOPY N/A 11/24/2013   Procedure: ESOPHAGOGASTRODUODENOSCOPY (EGD);  Surgeon: Juanita Craver, MD;  Location: [facility];  Service: Endoscopy;  Laterality: N/A;   GLAUCOMA SURGERY Bilateral  2019   "release pressure   KNEE ARTHROSCOPY Right 10/17/2013   Procedure: ARTHROSCOPY RIGHT KNEE;  Surgeon: [name], MD;  Location: [facility];  Service: Orthopedics;  Laterality: Right;   LAPAROSCOPIC CHOLECYSTECTOMY  02-03-2010   dr d blackman  '@[facility]'$    LOOP RECORDER IMPLANT  03/18/2013   LOOP RECORDER IMPLANT N/A 03/18/2013   Procedure: LOOP RECORDER IMPLANT;  Surgeon: Sanda Klein, MD;  Location: [facility];  Service: Cardiovascular;  Laterality: N/A;   SHOULDER ARTHROSCOPY WITH ROTATOR CUFF REPAIR Right 01/16/2019   Procedure: Right shoulder arthroscopy with subacromial decompression, distal clavicle resection, rotator cuff repair, bicep tenotomy;  Surgeon: Justice Britain, MD;  Location: [facility];  Service: Orthopedics;  Laterality: Right;   TUBAL LIGATION  yrs ago   Family History  Problem Relation Age of Onset   Cancer Mother        breast   Hypertension Mother    Breast cancer Mother    Cancer Father        prostate   Cancer Brother    Diabetes Sister    Heart attack Sister    Migraines Neg Hx    Social History   Tobacco Use   Smoking status: Never   Smokeless tobacco: Never  Vaping Use   Vaping Use: Never used  Substance Use Topics   Alcohol use: No   Drug use: No   ROS See pertinent in HPI. All other systems reviewed and non contributory Blood pressure  117/75, pulse 70, height 5' (1.524 m), weight 132 lb (59.9 kg).  GENERAL: Well-developed, well-nourished female in no acute distress.  ABDOMEN: Soft, nontender, nondistended. No organomegaly. PELVIC: Normal external female genitalia. Vagina is pale and atrophic with evidence of lichen sclerosis at 6 and 12 o'clock approximately 1 cm lesions.  Normal discharge. Normal appearing cervix. Uterus is normal in size. No adnexal mass or tenderness. Chaperone present during the pelvic exam EXTREMITIES: No cyanosis, clubbing, or edema, 2+ distal pulses.  [name] 76 yo here for follow up on lichen sclerosis - Continue current  regimen - Screening mammogram ordered - RTC in 6 months - Follow up with specialist as scheduled

## 2022-09-26 NOTE — Progress Notes (Signed)
Follow up lichen sclerosis.  Had mammo 09/20/22 [facility], W[facility].

## 2022-09-28 LAB — URINE CULTURE: Organism ID, Bacteria: NO GROWTH

## 2022-09-29 ENCOUNTER — Ambulatory Visit: Payer: Medicare HMO | Attending: Cardiovascular Disease | Admitting: General Practice

## 2022-09-29 DIAGNOSIS — Z01810 Encounter for preprocedural cardiovascular examination: Secondary | ICD-10-CM

## 2022-09-29 NOTE — Progress Notes (Signed)
Virtual Visit via Telephone Note   Because of Autumn Fitzgerald's co-morbid illnesses, she is at least at moderate risk for complications without adequate follow up.  This format is felt to be most appropriate for this patient at this time.  The patient did not have access to video technology/had technical difficulties with video requiring transitioning to audio format only (telephone).  All issues noted in this document were discussed and addressed.  No physical exam could be performed with this format.  Please refer to the patient's chart for her consent to telehealth for [facility].  Evaluation Performed:  Preoperative cardiovascular risk assessment _____________   Date:  09/29/2022   Patient ID:  Autumn Fitzgerald, DOB December 30, 1945, MRN [id number] Patient Location:  Home Provider location:   Office  Primary Care Provider:  Nolene Ebbs, MD Primary Cardiologist:  Sanda Klein, MD  Chief Complaint / Patient Profile   76 y.o. y/o female with a h/o hypertrophic cardiomyopathy, LBBB, HTN, hyperlipidemia who is pending LEFT KNEE SCOPE AND MENISCUS DEBRIDEMENT and presents today for telephonic preoperative cardiovascular risk assessment.  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)    Hypothyroidism    followed by pcp   LBBB (left bundle branch block)    Mild persistent allergic asthma    Palpitations    Panic attacks    PONV (postoperative nausea and vomiting)    Pre-diabetes    Wears glasses    Past Surgical History:  Procedure Laterality Date   CARDIAC SURGERY  02/05/2007   in [place]  for HOCM   CATARACT EXTRACTION W/ INTRAOCULAR LENS  IMPLANT, BILATERAL  right 07-23-2017;  left 10-15-2017   COLONOSCOPY N/A 11/24/2013   Procedure: COLONOSCOPY;  Surgeon: Juanita Craver, MD;  [facility]: [facility];  Service: Endoscopy;  Laterality: N/A;   DOPPLER ECHOCARDIOGRAPHY  03/14/2013   EF 65-70%,trivial AI,mild-mod. MR,Mod. TR,mod. concentric hypertrophy   EP IMPLANTABLE DEVICE N/A 08/22/2016   Procedure: Loop Recorder Removal;  Surgeon: Sanda Klein, MD;  Location: [place] [facility];  Service: Cardiovascular;  Laterality: N/A;   ESOPHAGOGASTRODUODENOSCOPY N/A 11/24/2013   Procedure: ESOPHAGOGASTRODUODENOSCOPY (EGD);  Surgeon: Juanita Craver, MD;  [facility]: [facility];  Service: Endoscopy;  Laterality: N/A;   GLAUCOMA SURGERY Bilateral 2019   "release pressure   KNEE ARTHROSCOPY Right 10/17/2013   Procedure: ARTHROSCOPY RIGHT KNEE;  Surgeon: [name], MD;  Location: [place];  Service: Orthopedics;  Laterality: Right;   LAPAROSCOPIC CHOLECYSTECTOMY  02-03-2010   dr d blackman  '@[facility]    LOOP RECORDER IMPLANT  03/18/2013   LOOP RECORDER IMPLANT N/A 03/18/2013   Procedure: LOOP RECORDER IMPLANT;  Surgeon: Sanda Klein, MD;  Location: [place] [facility];  Service: Cardiovascular;  Laterality: N/A;   SHOULDER ARTHROSCOPY WITH ROTATOR CUFF REPAIR Right 01/16/2019   Procedure: Right shoulder arthroscopy with subacromial decompression, distal clavicle resection, rotator cuff repair, bicep tenotomy;  Surgeon: Justice Britain, MD;  Location: [facility];  Service: Orthopedics;  Laterality: Right;   TUBAL LIGATION  yrs ago    Allergies  Allergies  Allergen Reactions   Amoxicillin-Pot Clavulanate Other (See Comments)    Severe pains and GI symptoms Did it involve swelling of the face/tongue/throat, SOB, or low BP? No Did it involve sudden or severe rash/hives, skin peeling, or any reaction on the inside of your mouth or nose? No Did you need to seek medical attention at a hospital  or doctor's office? No When did it last happen?      within the past 10 years If all above answers are "NO", may proceed with cephalosporin use.    Temazepam Other (See Comments)    Dizziness and doesn't work for patient   Lipitor [Atorvastatin Calcium]     Muscle pain   Penicillin G Other (See Comments)    Other reaction(s): Unknown    History of Present Illness    Autumn Fitzgerald is a 76 y.o. female who presents via audio/video conferencing for a telehealth visit today.  Pt was last seen in cardiology clinic on 06/22/2022 by Dr. Sallyanne Kuster.  At that time KARNE OZGA was doing well .  The patient is now pending procedure as outlined above. Since her last visit, she remained stable from a cardiac standpoint.  Today she denies chest pain, shortness of breath, lower extremity edema, fatigue, palpitations, melena, hematuria, hemoptysis, diaphoresis, weakness, presyncope, syncope, orthopnea, and PND.    Home Medications    Prior to Admission medications   Medication Sig Start Date End Date Taking? Authorizing Provider  acetaminophen (TYLENOL) 500 MG tablet Take 500 mg by mouth every 6 (six) hours as needed for moderate pain or headache.    [provider]  albuterol (VENTOLIN HFA) 108 (90 Base) MCG/ACT inhaler Inhale 2 puffs into the lungs every 4 (four) hours as needed for wheezing or shortness of breath. INHALE TWO PUFFS BY MOUTH EVERY 4 TO 6 HOURS INTO LUNGS AS NEEDED FOR COUGH OR WHEEZE 07/18/22   Kozlow, Donnamarie Poag, MD  atenolol (TENORMIN) 25 MG tablet Take 0.5 tablets (12.5 mg total) by mouth daily. 12/03/19   Croitoru, Mihai, MD  Boswellia-Glucosamine-Vit D (OSTEO BI-FLEX ONE PER DAY PO) Take 1 tablet by mouth.    [provider]  Cholecalciferol 50 MCG (2000 UT) TABS Take 2,000 Units by mouth daily in the afternoon.    [provider]  clonazePAM (KLONOPIN) 2 MG tablet Take by mouth. 01/09/22   [provider]  ezetimibe-simvastatin (VYTORIN) 10-40 MG  per tablet Take 1 tablet by mouth every evening.     [provider]  FLOVENT HFA 44 MCG/ACT inhaler Inhale 2 puffs into the lungs daily. INHALE 2 PUFFS INTO THE LUNGS DAILY WITH SPACER. 07/18/22   Kozlow, Donnamarie Poag, MD  fluticasone (FLONASE) 50 MCG/ACT nasal spray Place 1 spray into both nostrils daily. 07/18/22   Kozlow, Donnamarie Poag, MD  lansoprazole (PREVACID) 15 MG capsule Take 1 capsule (15 mg total) by mouth in the morning. 07/18/22   Kozlow, Donnamarie Poag, MD  levothyroxine (SYNTHROID) 50 MCG tablet Take 50 mcg by mouth daily.  06/15/14   [provider]  Multiple Vitamin (MULTIVITAMIN) tablet Take 1 tablet by mouth daily.    [provider]  Polyethyl Glycol-Propyl Glycol 0.4-0.3 % SOLN Place one drop in both eyes daily as needed 01/04/21   Kozlow, Donnamarie Poag, MD  torsemide (DEMADEX) 5 MG tablet Take 5 mg by mouth daily.    [provider]    Physical Exam    Vital  Signs:  ANASHA PERFECTO does not have vital signs available for review today.  Given telephonic nature of communication, physical exam is limited. AAOx3. NAD. Normal affect.  Speech and respirations are unlabored.  Accessory Clinical Findings    None  Assessment & Plan    1.  Preoperative Cardiovascular Risk Assessment: Left knee arthroscopy with meniscus debridement; Dr. Mariea Stable; [facility]    Primary Cardiologist: Sanda Klein, MD  Chart reviewed as part of pre-operative protocol coverage. Given past medical history and time since last visit, based on ACC/AHA guidelines, JENESA FORESTA would be at acceptable risk for the planned procedure without further cardiovascular testing.   Patient was advised that if she develops new symptoms prior to surgery to contact our office to arrange a follow-up appointment.  She verbalized understanding.  Her RCRI is a class II risk, 0.9% risk of major cardiac event.  She is able to complete greater than 4 METS of physical activity.  I will route this  recommendation to the requesting party via Epic fax function and remove from pre-op pool.       Time:   Today, I have spent 5 minutes with the patient with telehealth technology discussing medical history, symptoms, and management plan.  Prior to her phone evaluation I spent greater than 10 minutes reviewing her past medical history and cardiac medications.   Deberah Pelton, NP  09/29/2022, 9:13 AM

## 2022-10-02 DIAGNOSIS — F333 Major depressive disorder, recurrent, severe with psychotic symptoms: Secondary | ICD-10-CM | POA: Diagnosis not present

## 2022-10-03 DIAGNOSIS — M7061 Trochanteric bursitis, right hip: Secondary | ICD-10-CM | POA: Diagnosis not present

## 2022-10-03 DIAGNOSIS — M25551 Pain in right hip: Secondary | ICD-10-CM | POA: Diagnosis not present

## 2022-10-04 DIAGNOSIS — H43813 Vitreous degeneration, bilateral: Secondary | ICD-10-CM | POA: Diagnosis not present

## 2022-10-04 DIAGNOSIS — G43809 Other migraine, not intractable, without status migrainosus: Secondary | ICD-10-CM | POA: Diagnosis not present

## 2022-10-04 DIAGNOSIS — H40033 Anatomical narrow angle, bilateral: Secondary | ICD-10-CM | POA: Diagnosis not present

## 2022-10-05 ENCOUNTER — Telehealth: Payer: Self-pay

## 2022-10-05 ENCOUNTER — Other Ambulatory Visit: Payer: Self-pay | Admitting: Obstetrics and Gynecology

## 2022-10-05 MED ORDER — CLOBETASOL PROPIONATE 0.05 % EX OINT: TOPICAL_OINTMENT | CUTANEOUS | 5 refills | Status: DC

## 2022-10-05 NOTE — Telephone Encounter (Signed)
Returned call and advised that a message was sent to the provider to send in the prescription.

## 2022-10-09 DIAGNOSIS — G43719 Chronic migraine without aura, intractable, without status migrainosus: Secondary | ICD-10-CM | POA: Diagnosis not present

## 2022-10-10 ENCOUNTER — Other Ambulatory Visit: Payer: Self-pay | Admitting: Allergy and Immunology

## 2022-10-18 DIAGNOSIS — M7061 Trochanteric bursitis, right hip: Secondary | ICD-10-CM | POA: Diagnosis not present

## 2022-10-23 DIAGNOSIS — R7303 Prediabetes: Secondary | ICD-10-CM | POA: Diagnosis not present

## 2022-10-23 DIAGNOSIS — I5032 Chronic diastolic (congestive) heart failure: Secondary | ICD-10-CM | POA: Diagnosis not present

## 2022-10-23 DIAGNOSIS — M25562 Pain in left knee: Secondary | ICD-10-CM | POA: Diagnosis not present

## 2022-10-23 DIAGNOSIS — I10 Essential (primary) hypertension: Secondary | ICD-10-CM | POA: Diagnosis not present

## 2022-10-23 DIAGNOSIS — J4520 Mild intermittent asthma, uncomplicated: Secondary | ICD-10-CM | POA: Diagnosis not present

## 2022-10-23 NOTE — Progress Notes (Signed)
Surgery orders requested via Epic inbox. °

## 2022-10-24 DIAGNOSIS — F333 Major depressive disorder, recurrent, severe with psychotic symptoms: Secondary | ICD-10-CM | POA: Diagnosis not present

## 2022-10-24 NOTE — H&P (Signed)
Patient's anticipated LOS is less than 2 midnights, meeting these requirements: - Younger than 70 - Lives within 1 hour of care - Has a competent adult at home to recover with post-op recover - NO history of  - Chronic pain requiring opiods  - Diabetes  - Coronary Artery Disease  - Heart failure  - Heart attack  - Stroke  - DVT/VTE  - Cardiac arrhythmia  - Respiratory Failure/COPD  - Renal failure  - Anemia  - Advanced Liver disease     Autumn Fitzgerald is an 76 y.o. female.    Chief Complaint: left knee pain HPI: Pt is a 76 y.o. female complaining of left knee pain for multiple months. Pain had continually increased since the beginning. X-rays in the clinic show meniscal tear left knee. Pt has tried various conservative treatments which have failed to alleviate their symptoms, including injections and therapy. Various options are discussed with the patient. Risks, benefits and expectations were discussed with the patient. Patient understand the risks, benefits and expectations and wishes to proceed with surgery.   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)    Hypothyroidism    followed by pcp   LBBB (left bundle branch block)    Mild persistent allergic asthma    Palpitations    Panic attacks    PONV (postoperative nausea and vomiting)    Pre-diabetes    Wears glasses     PSH: Past Surgical History:  Procedure Laterality Date   CARDIAC SURGERY  02/05/2007   in [place]  for HOCM   CATARACT EXTRACTION W/ INTRAOCULAR LENS  IMPLANT, BILATERAL   right 07-23-2017;  left 10-15-2017   COLONOSCOPY N/A 11/24/2013   Procedure: COLONOSCOPY;  Surgeon: Juanita Craver, MD;  [facility];  Service: Endoscopy;  Laterality: N/A;   DOPPLER ECHOCARDIOGRAPHY  03/14/2013   EF 65-70%,trivial AI,mild-mod. MR,Mod. TR,mod. concentric hypertrophy   EP IMPLANTABLE DEVICE N/A 08/22/2016   Procedure: Loop Recorder Removal;  Surgeon: Sanda Klein, MD;  Location: [facility];  Service: Cardiovascular;  Laterality: N/A;   ESOPHAGOGASTRODUODENOSCOPY N/A 11/24/2013   Procedure: ESOPHAGOGASTRODUODENOSCOPY (EGD);  Surgeon: Juanita Craver, MD;  [facility];  Service: Endoscopy;  Laterality: N/A;   GLAUCOMA SURGERY Bilateral 2019   "release pressure   KNEE ARTHROSCOPY Right 10/17/2013   Procedure: ARTHROSCOPY RIGHT KNEE;  Surgeon: [name], MD;  Location: [facility];  Service: Orthopedics;  Laterality: Right;   LAPAROSCOPIC CHOLECYSTECTOMY  02-03-2010   dr d blackman  '@[facility]    LOOP RECORDER IMPLANT  03/18/2013   LOOP RECORDER IMPLANT N/A 03/18/2013   Procedure: LOOP RECORDER IMPLANT;  Surgeon: Sanda Klein, MD;  Location: [facility];  Service: Cardiovascular;  Laterality: N/A;   SHOULDER ARTHROSCOPY WITH ROTATOR CUFF REPAIR Right 01/16/2019   Procedure: Right shoulder arthroscopy with subacromial decompression, distal clavicle resection, rotator cuff repair, bicep tenotomy;  Surgeon: Justice Britain, MD;  [facility];  Service: Orthopedics;  Laterality: Right;  TUBAL LIGATION  yrs ago    Social History:  reports that she has never smoked. She has never used smokeless tobacco. She reports that she does not drink alcohol and does not use drugs. BMI: Estimated body mass index is 25.78 kg/m as calculated from the following:   Height as of 09/26/22: 5' (1.524 m).   Weight as of 09/26/22: 59.9 kg.  Lab Results  Component Value Date   ALBUMIN 4.2 04/21/2021   Diabetes: Patient does not have a diagnosis of diabetes.     Smoking Status:    reports that she has never smoked. She has never used smokeless tobacco.    Allergies:  Allergies  Allergen Reactions   Amoxicillin-Pot Clavulanate Other (See Comments)    Severe pains and GI symptoms Did it involve swelling of the face/tongue/throat, SOB, or low BP? No Did it involve sudden or severe rash/hives, skin peeling, or any reaction on the inside of your mouth or nose? No Did you need to seek medical attention at a hospital or doctor's office? No When did it last happen?      within the past 10 years If all above answers are "NO", may proceed with cephalosporin use.    Temazepam Other (See Comments)    Dizziness and doesn't work for patient   Lipitor [Atorvastatin Calcium]     Muscle pain   Penicillin G Other (See Comments)    Other reaction(s): Unknown    Medications: No current facility-administered medications for this encounter.   Current Outpatient Medications  Medication Sig Dispense Refill   acetaminophen (TYLENOL) 500 MG tablet Take 500 mg by mouth every 6 (six) hours as needed for moderate pain or headache.     albuterol (VENTOLIN HFA) 108 (90 Base) MCG/ACT inhaler INHALE 2 PUFFS BY MOUTH INTO LUNGS EVERY 4 TO 6 HOURS AS NEEDED FOR WHEEZING/COUGH/SHORTNESS OF BREATH 18 g 1   atenolol (TENORMIN) 25 MG tablet Take 0.5 tablets (12.5 mg total) by mouth daily. 30 tablet 3   Boswellia-Glucosamine-Vit D (OSTEO BI-FLEX ONE PER DAY PO) Take 1 tablet by mouth.     Cholecalciferol 50 MCG (2000 UT) TABS Take 2,000 Units by mouth daily in the afternoon.     clobetasol ointment (TEMOVATE) 0.05 % Apply to affected area every night for 4 weeks, then every other day for 4 weeks and then twice a week for 4 weeks or until resolution. 30 g 5   clonazePAM (KLONOPIN) 2 MG tablet Take by mouth.     ezetimibe-simvastatin (VYTORIN) 10-40 MG per tablet Take 1 tablet by mouth every evening.      FLOVENT HFA 44 MCG/ACT inhaler Inhale 2 puffs into the lungs daily. INHALE 2 PUFFS INTO THE  LUNGS DAILY WITH SPACER. 10.6 g 5   fluticasone (FLONASE) 50 MCG/ACT nasal spray Place 1 spray into both nostrils daily. 16 g 5   lansoprazole (PREVACID) 15 MG capsule Take 1 capsule (15 mg total) by mouth in the morning. 30 capsule 5   levothyroxine (SYNTHROID) 50 MCG tablet Take 50 mcg by mouth daily.      Multiple Vitamin (MULTIVITAMIN) tablet Take 1 tablet by mouth daily.     Polyethyl Glycol-Propyl Glycol 0.4-0.3 % SOLN Place one drop in both eyes daily as needed 30 mL 2   torsemide (DEMADEX) 5 MG tablet Take 5 mg by mouth daily.      No results found for this or any previous visit (from the past 48 hour(s)). No results found.  ROS: Pain  with rom of the left lower extremity  Physical Exam: Alert and oriented 76 y.o. female in no acute distress Cranial nerves 2-12 intact Cervical spine: full rom with no tenderness, nv intact distally Chest: active breath sounds bilaterally, no wheeze rhonchi or rales Heart: regular rate and rhythm, no murmur Abd: non tender non distended with active bowel sounds Hip is stable with rom  Left knee painful rom Nv intact distally No rashes or edema  Assessment/Plan Assessment: left knee meniscal tear  Plan:  Patient will undergo a left knee  by Dr. Veverly Fells at left knee scope Risks benefits and expectations were discussed with the patient. Patient understand risks, benefits and expectations and wishes to proceed. Preoperative templating of the joint replacement has been completed, documented, and submitted to the Operating Room personnel in order to optimize intra-operative equipment management.   Merla Riches PA-C, MPAS [facility] is now [employer] [address] [place], [place], [place] [address] Phone: [phone] [email or web address] Facebook  [employer]

## 2022-10-26 NOTE — Progress Notes (Addendum)
COVID Vaccine Completed:  No  Date of COVID positive in last 90 days:  No  PCP - Nolene Ebbs, MD Cardiologist - Sanda Klein, MD  Cardiac clearance in Epic dated 09-29-22 by Coletta Memos, NP  Chest x-ray - 07-29-22 Epic EKG - 07-29-22 Epic Stress Test - 2020 Epic ECHO - 07-06-22 Epic  Cardiac Cath - In [place] several years ago Pacemaker/ICD device last checked: Spinal Cord Stimulator:  Bowel Prep - N/A  Sleep Study - N/A CPAP -   Prediabetes Fasting Blood Sugar -  Checks Blood Sugar - does not check   Last dose of GLP1 agonist-  N/A GLP1 instructions:  N/A   Last dose of SGLT-2 inhibitors-  N/A SGLT-2 instructions: N/A  Blood Thinner Instructions: Aspirin Instructions:  ASA 81.  Patient states that she is to stay on.   Last Dose:  Activity level:  Can go up a flight of stairs and perform activities of daily living without stopping and without symptoms of chest pain or shortness of breath.  Anesthesia review: LBBB, cardiomyopathy, HTN.  Hx of septal myectomy.  CHF  Patient denies shortness of breath, fever, cough and chest pain at PAT appointment  Patient verbalized understanding of instructions that were given to them at the PAT appointment. Patient was also instructed that they will need to review over the PAT instructions again at home before surgery.

## 2022-10-26 NOTE — Patient Instructions (Addendum)
SURGICAL WAITING ROOM VISITATION Patients having surgery or a procedure may have no more than 2 support people in the waiting area - these visitors may rotate.   Children under the age of 79 must have an adult with them who is not the patient. If the patient needs to stay at the hospital during part of their recovery, the visitor guidelines for inpatient rooms apply. Pre-op nurse will coordinate an appropriate time for 1 support person to accompany patient in pre-op.  This support person may not rotate.    Please refer to the [facility] website for the visitor guidelines for Inpatients (after your surgery is over and you are in a regular room).      Your procedure is scheduled on: 11-08-22   Report to [facility] Main Entrance    Report to admitting at 1:00 PM   Call this number if you have problems the morning of surgery [phone]   Do not eat food :After Midnight.   After Midnight you may have the following liquids until 12:15 PM DAY OF SURGERY  Water Non-Citrus Juices (without pulp, NO RED) Carbonated Beverages Black Coffee (NO MILK/CREAM OR CREAMERS, sugar ok)  Clear Tea (NO MILK/CREAM OR CREAMERS, sugar ok) regular and decaf                             Plain Jell-O (NO RED)                                           Fruit ices (not with fruit pulp, NO RED)                                     Popsicles (NO RED)                                                               Sports drinks like Gatorade (NO RED)                   The day of surgery:  Drink ONE (1) Pre-Surgery G2 at 12:15 PM the morning of surgery. Drink in one sitting. Do not sip.  This drink was given to you during your hospital  pre-op appointment visit. Nothing else to drink after completing the Pre-Surgery G2.          If you have questions, please contact your surgeon's office.   FOLLOW  ANY ADDITIONAL PRE OP INSTRUCTIONS YOU RECEIVED FROM YOUR SURGEON'S OFFICE!!!     Oral Hygiene is also  important to reduce your risk of infection.                                    Remember - BRUSH YOUR TEETH THE MORNING OF SURGERY WITH YOUR REGULAR TOOTHPASTE   Do NOT smoke after Midnight   Take these medicines the morning of surgery with A SIP OF WATER: Atenolol Levothyroxine Pantoprazole Okay to take Tylenol Okay to use inhalers  You may not have any metal on your body including hair pins, jewelry, and body piercing             Do not wear make-up, lotions, powders, perfumes or deodorant              Men may shave face and neck.   Do not bring valuables to the hospital. [place].   Contacts, dentures or bridgework may not be worn into surgery.  DO NOT [place]. PHARMACY WILL DISPENSE MEDICATIONS LISTED ON YOUR MEDICATION LIST TO YOU DURING YOUR ADMISSION [place]!    Patients discharged on the day of surgery will not be allowed to drive home.  Someone NEEDS to stay with you for the first 24 hours after anesthesia.               Please read over the following fact sheets you were given: IF [place]  If you received a COVID test during your pre-op visit  it is requested that you wear a mask when out in public, stay away from anyone that may not be feeling well and notify your surgeon if you develop symptoms. If you test positive for Covid or have been in contact with anyone that has tested positive in the last 10 days please notify you surgeon.  [facility] - Preparing for Surgery Before surgery, you can play an important role.  Because skin is not sterile, your skin needs to be as free of germs as possible.  You can reduce the number of germs on your skin by washing with CHG (chlorahexidine gluconate) soap before surgery.  CHG is an antiseptic cleaner which kills germs and bonds with the skin to continue  killing germs even after washing. Please DO NOT use if you have an allergy to CHG or antibacterial soaps.  If your skin becomes reddened/irritated stop using the CHG and inform your nurse when you arrive at Short Stay. Do not shave (including legs and underarms) for at least 48 hours prior to the first CHG shower.  You may shave your face/neck.  Please follow these instructions carefully:  1.  Shower with CHG Soap the night before surgery and the  morning of surgery.  2.  If you choose to wash your hair, wash your hair first as usual with your normal  shampoo.  3.  After you shampoo, rinse your hair and body thoroughly to remove the shampoo.                             4.  Use CHG as you would any other liquid soap.  You can apply chg directly to the skin and wash.  Gently with a scrungie or clean washcloth.  5.  Apply the CHG Soap to your body ONLY FROM THE NECK DOWN.   Do   not use on face/ open                           Wound or open sores. Avoid contact with eyes, ears mouth and   genitals (private parts).                       Wash face,  Genitals (private parts) with your normal soap.  6.  Wash thoroughly, paying special attention to the area where your    surgery  will be performed.  7.  Thoroughly rinse your body with warm water from the neck down.  8.  DO NOT shower/wash with your normal soap after using and rinsing off the CHG Soap.                9.  Pat yourself dry with a clean towel.            10.  Wear clean pajamas.            11.  Place clean sheets on your bed the night of your first shower and do not  sleep with pets. Day of Surgery : Do not apply any lotions/deodorants the morning of surgery.  Please wear clean clothes to the hospital/surgery center.  FAILURE TO FOLLOW THESE INSTRUCTIONS MAY RESULT IN THE CANCELLATION OF YOUR SURGERY  PATIENT SIGNATURE_________________________________  NURSE  SIGNATURE__________________________________  ________________________________________________________________________    Adam Phenix  An incentive spirometer is a tool that can help keep your lungs clear and active. This tool measures how well you are filling your lungs with each breath. Taking long deep breaths may help reverse or decrease the chance of developing breathing (pulmonary) problems (especially infection) following: A long period of time when you are unable to move or be active. BEFORE THE PROCEDURE  If the spirometer includes an indicator to show your best effort, your nurse or respiratory therapist will set it to a desired goal. If possible, sit up straight or lean slightly forward. Try not to slouch. Hold the incentive spirometer in an upright position. INSTRUCTIONS FOR USE  Sit on the edge of your bed if possible, or sit up as far as you can in bed or on a chair. Hold the incentive spirometer in an upright position. Breathe out normally. Place the mouthpiece in your mouth and seal your lips tightly around it. Breathe in slowly and as deeply as possible, raising the piston or the ball toward the top of the column. Hold your breath for 3-5 seconds or for as long as possible. Allow the piston or ball to fall to the bottom of the column. Remove the mouthpiece from your mouth and breathe out normally. Rest for a few seconds and repeat Steps 1 through 7 at least 10 times every 1-2 hours when you are awake. Take your time and take a few normal breaths between deep breaths. The spirometer may include an indicator to show your best effort. Use the indicator as a goal to work toward during each repetition. After each set of 10 deep breaths, practice coughing to be sure your lungs are clear. If you have an incision (the cut made at the time of surgery), support your incision when coughing by placing a pillow or rolled up towels firmly against it. Once you are able to get out of  bed, walk around indoors and cough well. You may stop using the incentive spirometer when instructed by your caregiver.  RISKS AND COMPLICATIONS Take your time so you do not get dizzy or light-headed. If you are in pain, you may need to take or ask for pain medication before doing incentive spirometry. It is harder to take a deep breath if you are having pain. AFTER USE Rest and breathe slowly and easily. It can be helpful to keep track of a log of your progress. Your caregiver can provide you with a simple table to help with  this. If you are using the spirometer at home, follow these instructions: [place] IF:  You are having difficultly using the spirometer. You have trouble using the spirometer as often as instructed. Your pain medication is not giving enough relief while using the spirometer. You develop fever of 100.5 F (38.1 C) or higher. SEEK IMMEDIATE MEDICAL CARE IF:  You cough up bloody sputum that had not been present before. You develop fever of 102 F (38.9 C) or greater. You develop worsening pain at or near the incision site. MAKE SURE YOU:  Understand these instructions. Will watch your condition. Will get help right away if you are not doing well or get worse. Document Released: 04/09/2007 Document Revised: 02/19/2012 Document Reviewed: 06/10/2007 [facility] Patient Information 2014 [place], [place].   ________________________________________________________________________

## 2022-10-27 ENCOUNTER — Other Ambulatory Visit: Payer: Self-pay

## 2022-10-27 ENCOUNTER — Encounter (HOSPITAL_COMMUNITY)
Admission: RE | Admit: 2022-10-27 | Discharge: 2022-10-27 | Disposition: A | Discharge status: Home / Self Care | Payer: Medicare HMO | Source: Ambulatory Visit | Attending: Orthopedic Surgery | Admitting: Orthopedic Surgery

## 2022-10-27 ENCOUNTER — Encounter (HOSPITAL_COMMUNITY): Payer: Self-pay

## 2022-10-27 VITALS — BP 123/78 | HR 87 | Temp 98.0°F | Resp 18 | Ht 60.0 in | Wt 130.5 lb

## 2022-10-27 DIAGNOSIS — I447 Left bundle-branch block, unspecified: Secondary | ICD-10-CM | POA: Diagnosis not present

## 2022-10-27 DIAGNOSIS — I083 Combined rheumatic disorders of mitral, aortic and tricuspid valves: Secondary | ICD-10-CM | POA: Insufficient documentation

## 2022-10-27 DIAGNOSIS — S83242A Other tear of medial meniscus, current injury, left knee, initial encounter: Secondary | ICD-10-CM | POA: Insufficient documentation

## 2022-10-27 DIAGNOSIS — Z01812 Encounter for preprocedural laboratory examination: Secondary | ICD-10-CM | POA: Insufficient documentation

## 2022-10-27 DIAGNOSIS — I110 Hypertensive heart disease with heart failure: Secondary | ICD-10-CM | POA: Diagnosis not present

## 2022-10-27 DIAGNOSIS — I2510 Atherosclerotic heart disease of native coronary artery without angina pectoris: Secondary | ICD-10-CM | POA: Insufficient documentation

## 2022-10-27 DIAGNOSIS — R7303 Prediabetes: Secondary | ICD-10-CM | POA: Insufficient documentation

## 2022-10-27 DIAGNOSIS — I509 Heart failure, unspecified: Secondary | ICD-10-CM | POA: Insufficient documentation

## 2022-10-27 DIAGNOSIS — I421 Obstructive hypertrophic cardiomyopathy: Secondary | ICD-10-CM | POA: Diagnosis not present

## 2022-10-27 HISTORY — DX: Pneumonia, unspecified organism: J18.9

## 2022-10-27 HISTORY — DX: Heart failure, unspecified: I50.9

## 2022-10-27 LAB — GLUCOSE, CAPILLARY: Glucose-Capillary: 106 mg/dL — ABNORMAL HIGH (ref 70–99)

## 2022-10-27 LAB — NO BLOOD PRODUCTS

## 2022-10-27 LAB — BASIC METABOLIC PANEL
Anion gap: 6 (ref 5–15)
BUN: 12 mg/dL (ref 8–23)
CO2: 30 mmol/L (ref 22–32)
Calcium: 9 mg/dL (ref 8.9–10.3)
Chloride: 104 mmol/L (ref 98–111)
Creatinine, Ser: 0.73 mg/dL (ref 0.44–1.00)
GFR, Estimated: >60 mL/min (ref >60)
Glucose, Bld: 81 mg/dL (ref 70–99)
Potassium: 3.8 mmol/L (ref 3.5–5.1)
Sodium: 140 mmol/L (ref 135–145)

## 2022-10-27 LAB — HEMOGLOBIN A1C
Hgb A1c MFr Bld: 5.5 % (ref 4.8–5.6)
Mean Plasma Glucose: 111.15 mg/dL

## 2022-10-30 NOTE — Anesthesia Preprocedure Evaluation (Addendum)
Anesthesia Evaluation  Patient identified by MRN, date of birth, ID band Patient awake    Reviewed: Allergy & Precautions, NPO status , Patient's Chart, lab work & pertinent test results  Airway Mallampati: II  TM Distance: >3 FB Neck ROM: Full    Dental  (+) Dental Advisory Given   Pulmonary asthma    breath sounds clear to auscultation       Cardiovascular hypertension, Pt. on medications +CHF  + dysrhythmias (LBBB)  Rhythm:Regular Rate:Normal     Neuro/Psych negative neurological ROS     GI/Hepatic Neg liver ROS,GERD  ,,  Endo/Other  Hypothyroidism    Renal/GU negative Renal ROS     Musculoskeletal  (+) Arthritis ,    Abdominal   Peds  Hematology negative hematology ROS (+)   Anesthesia Other Findings   Reproductive/Obstetrics                             Lab Results  Component Value Date   WBC 7.4 07/29/2022   HGB 14.2 07/29/2022   HCT 42.6 07/29/2022   MCV 86.4 07/29/2022   PLT 276 07/29/2022   Lab Results  Component Value Date   CREATININE 0.73 10/27/2022   BUN 12 10/27/2022   NA 140 10/27/2022   K 3.8 10/27/2022   CL 104 10/27/2022   CO2 30 10/27/2022    IMPRESSIONS     1. Hyperdynamic contraction, HOCM post septal myomectomy - peak velocity  2.7 m/s, PG 28 mmHg with Valsalva. Left ventricular ejection fraction, by  estimation, is >75%. The left ventricle has hyperdynamic function. The  left ventricle has no regional wall   motion abnormalities. There is mild left ventricular hypertrophy. Left  ventricular diastolic parameters are consistent with Grade I diastolic  dysfunction (impaired relaxation). The average left ventricular global  longitudinal strain is -19.7 %. The  global longitudinal strain is normal.   2. Right ventricular systolic function is normal. The right ventricular  size is normal. There is normal pulmonary artery systolic pressure. The   estimated right ventricular systolic pressure is 32.3 mmHg.   3. No SAM. The mitral valve is normal in structure. Mild mitral valve  regurgitation. No evidence of mitral stenosis.   4. Tricuspid valve regurgitation is severe.   5. The aortic valve is normal in structure. Aortic valve regurgitation is  mild. No aortic stenosis is present.   6. Pulmonic valve regurgitation is moderate.   7. The inferior vena cava is normal in size with greater than 50%  respiratory variability, suggesting right atrial pressure of 3 mmHg.   Anesthesia Physical Anesthesia Plan  ASA: 3  Anesthesia Plan: General   Post-op Pain Management: Toradol IV (intra-op)* and Ofirmev IV (intra-op)*   Induction: Intravenous  PONV Risk Score and Plan: 3 and Dexamethasone, Ondansetron and Treatment may vary due to age or medical condition  Airway Management Planned: LMA  Additional Equipment: None  Intra-op Plan:   Post-operative Plan: Extubation in OR  Informed Consent: I have reviewed the patients History and Physical, chart, labs and discussed the procedure including the risks, benefits and alternatives for the proposed anesthesia with the patient or authorized representative who has indicated his/her understanding and acceptance.   Patient has DNR.  Discussed DNR with patient and Continue DNR.   Dental advisory given  Plan Discussed with: CRNA  Anesthesia Plan Comments:        Anesthesia Quick Evaluation

## 2022-10-30 NOTE — Progress Notes (Signed)
Anesthesia Chart Review   Case: [id number] Date/Time: 11/08/22 1455   Procedure: ARTHROSCOPY KNEE (Left: Knee) - 60 min   Anesthesia type: Choice   Pre-op diagnosis: left knee medial meniscus tear   Location: [facility] ROOM 08 / [facility]   Surgeons: Netta Cedars, MD       DISCUSSION:76 y.o. never smoker with h/o PONV, HOCM post septal myomectomy, chronic LBBB, HTN, left knee medial meniscus tear scheduled for above procedure 11/08/2022 with Dr. Netta Cedars.   Pt seen by cardiology 09/29/2022. Per OV note, "Chart reviewed as part of pre-operative protocol coverage. Given past medical history and time since last visit, based on ACC/AHA guidelines, Autumn Fitzgerald would be at acceptable risk for the planned procedure without further cardiovascular testing.    Patient was advised that if she develops new symptoms prior to surgery to contact our office to arrange a follow-up appointment.  She verbalized understanding.   Her RCRI is a class II risk, 0.9% risk of major cardiac event.  She is able to complete greater than 4 METS of physical activity."  Anticipate pt can proceed with planned procedure barring acute status change.   VS: BP 123/78   Pulse 87   Temp 36.7 C (Oral)   Resp 18   Ht 5' (1.524 m)   Wt 59.2 kg   SpO2 100%   BMI 25.49 kg/m   PROVIDERS: Nolene Ebbs, MD is PCP   Cardiologist - Sanda Klein, MD  LABS: Labs reviewed: Acceptable for surgery. (all labs ordered are listed, but only abnormal results are displayed)  Labs Reviewed  GLUCOSE, CAPILLARY - Abnormal; Notable for the following components:      Result Value   Glucose-Capillary 106 (*)    All other components within normal limits  HEMOGLOBIN J4N  BASIC METABOLIC PANEL  NO BLOOD PRODUCTS     IMAGES:   EKG:   CV: Echo 07/06/2022 1. Hyperdynamic contraction, HOCM post septal myomectomy - peak velocity  2.7 m/s, PG 28 mmHg with Valsalva. Left ventricular ejection fraction, by  estimation, is >75%. The  left ventricle has hyperdynamic function. The  left ventricle has no regional wall   motion abnormalities. There is mild left ventricular hypertrophy. Left  ventricular diastolic parameters are consistent with Grade I diastolic  dysfunction (impaired relaxation). The average left ventricular global  longitudinal strain is -19.7 %. The  global longitudinal strain is normal.   2. Right ventricular systolic function is normal. The right ventricular  size is normal. There is normal pulmonary artery systolic pressure. The  estimated right ventricular systolic pressure is 82.9 mmHg.   3. No SAM. The mitral valve is normal in structure. Mild mitral valve  regurgitation. No evidence of mitral stenosis.   4. Tricuspid valve regurgitation is severe.   5. The aortic valve is normal in structure. Aortic valve regurgitation is  mild. No aortic stenosis is present.   6. Pulmonic valve regurgitation is moderate.   7. The inferior vena cava is normal in size with greater than 50%  respiratory variability, suggesting right atrial pressure of 3 mmHg.   Myocardial Perfusion 12/24/2018 The left ventricular ejection fraction is hyperdynamic (>65%). Nuclear stress EF: 80%. There was no ST segment deviation noted during stress. The study is normal. This is a low risk study.   Normal pharmacologic nuclear stress test with no evidence for prior infarct or ischemia. Normal LVEF. 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)    Hypothyroidism    followed by pcp   LBBB (left bundle branch block)    Mild persistent  allergic asthma    Palpitations    Panic attacks    Pneumonia    PONV (postoperative nausea and vomiting)    Pre-diabetes    Wears glasses     Past Surgical History:  Procedure Laterality Date   CARDIAC SURGERY  02/05/2007   in [employer]  for HOCM   CATARACT EXTRACTION W/ INTRAOCULAR LENS  IMPLANT, BILATERAL  right 07-23-2017;  left 10-15-2017   COLONOSCOPY N/A 11/24/2013   Procedure: COLONOSCOPY;  Surgeon: Juanita Craver, MD;  Location[facility];  Service: Endoscopy;  Laterality: N/A;   DOPPLER ECHOCARDIOGRAPHY  03/14/2013   EF 65-70%,trivial AI,mild-mod. MR,Mod. TR,mod. concentric hypertrophy   EP IMPLANTABLE DEVICE N/A 08/22/2016   Procedure: Loop Recorder Removal;  Surgeon: Sanda Klein, MD;  Location: [facility];  Service: Cardiovascular;  Laterality: N/A;   ESOPHAGOGASTRODUODENOSCOPY N/A 11/24/2013   Procedure: ESOPHAGOGASTRODUODENOSCOPY (EGD);  Surgeon: Juanita Craver, MD;  [facility];  Service: Endoscopy;  Laterality: N/A;   GLAUCOMA SURGERY Bilateral 2019   "release pressure   KNEE ARTHROSCOPY Right 10/17/2013   Procedure: ARTHROSCOPY RIGHT KNEE;  Surgeon: [name], MD;  Location: [facility];  Service: Orthopedics;  Laterality: Right;   LAPAROSCOPIC CHOLECYSTECTOMY  02-03-2010   dr d blackman  '@[facility]'$    LOOP RECORDER IMPLANT  03/18/2013   LOOP RECORDER IMPLANT N/A 03/18/2013   Procedure: LOOP RECORDER IMPLANT;  Surgeon: Sanda Klein, MD;  Location: [facility] C[facility];  Service: Cardiovascular;  Laterality: N/A;   SHOULDER ARTHROSCOPY WITH ROTATOR CUFF REPAIR Right 01/16/2019   Procedure: Right shoulder arthroscopy with subacromial decompression, distal clavicle resection, rotator cuff repair, bicep tenotomy;  Surgeon: Justice Britain, MD;  [facility];  Service: Orthopedics;  Laterality: Right;   TUBAL LIGATION  yrs ago    MEDICATIONS:  acetaminophen (TYLENOL) 500 MG tablet   albuterol (VENTOLIN HFA) 108 (90 Base) MCG/ACT inhaler   aspirin EC 81  MG tablet   atenolol (TENORMIN) 25 MG tablet   [place]-Glucosamine-Vit D (OSTEO BI-FLEX ONE PER DAY PO)   Carboxymethylcellul-Glycerin (REFRESH RELIEVA OP)   Cholecalciferol 50 MCG (2000 UT) TABS   clobetasol ointment (TEMOVATE) 0.05 %   clonazePAM (KLONOPIN) 2 MG tablet   ezetimibe-simvastatin (VYTORIN) 10-40 MG per tablet   FLOVENT HFA 44 MCG/ACT inhaler   fluticasone (FLONASE) 50 MCG/ACT nasal spray   lansoprazole (PREVACID) 15 MG capsule   levothyroxine (SYNTHROID) 50 MCG tablet   Multiple Vitamin (MULTIVITAMIN) tablet   pantoprazole (PROTONIX) 40 MG tablet   Polyethyl Glycol-Propyl Glycol 0.4-0.3 % SOLN   torsemide (DEMADEX) 5 MG tablet   No current facility-administered medications for this encounter.     Konrad Felix Ward, PA-C [facility] [phone]

## 2022-11-08 ENCOUNTER — Ambulatory Visit (HOSPITAL_COMMUNITY)
Admission: RE | Admit: 2022-11-08 | Discharge: 2022-11-08 | Disposition: A | Discharge status: Home / Self Care | Payer: Medicare HMO | Attending: Orthopedic Surgery | Admitting: Orthopedic Surgery

## 2022-11-08 ENCOUNTER — Ambulatory Visit (HOSPITAL_COMMUNITY): Payer: Medicare HMO | Admitting: Physician Assistant

## 2022-11-08 ENCOUNTER — Encounter (HOSPITAL_COMMUNITY): Payer: Self-pay | Admitting: Orthopedic Surgery

## 2022-11-08 ENCOUNTER — Encounter (HOSPITAL_COMMUNITY)
Admission: RE | Disposition: A | Discharge status: Home / Self Care | Payer: Self-pay | Source: Home / Self Care | Attending: Orthopedic Surgery

## 2022-11-08 ENCOUNTER — Ambulatory Visit (HOSPITAL_BASED_OUTPATIENT_CLINIC_OR_DEPARTMENT_OTHER): Payer: Medicare HMO | Admitting: Anesthesiology

## 2022-11-08 DIAGNOSIS — E785 Hyperlipidemia, unspecified: Secondary | ICD-10-CM | POA: Diagnosis not present

## 2022-11-08 DIAGNOSIS — K219 Gastro-esophageal reflux disease without esophagitis: Secondary | ICD-10-CM | POA: Diagnosis not present

## 2022-11-08 DIAGNOSIS — X58XXXA Exposure to other specified factors, initial encounter: Secondary | ICD-10-CM | POA: Diagnosis not present

## 2022-11-08 DIAGNOSIS — I509 Heart failure, unspecified: Secondary | ICD-10-CM | POA: Insufficient documentation

## 2022-11-08 DIAGNOSIS — I110 Hypertensive heart disease with heart failure: Secondary | ICD-10-CM | POA: Insufficient documentation

## 2022-11-08 DIAGNOSIS — Z7951 Long term (current) use of inhaled steroids: Secondary | ICD-10-CM | POA: Insufficient documentation

## 2022-11-08 DIAGNOSIS — J45909 Unspecified asthma, uncomplicated: Secondary | ICD-10-CM | POA: Diagnosis not present

## 2022-11-08 DIAGNOSIS — M25562 Pain in left knee: Secondary | ICD-10-CM

## 2022-11-08 DIAGNOSIS — M94262 Chondromalacia, left knee: Secondary | ICD-10-CM

## 2022-11-08 DIAGNOSIS — Z79890 Hormone replacement therapy (postmenopausal): Secondary | ICD-10-CM | POA: Insufficient documentation

## 2022-11-08 DIAGNOSIS — R7303 Prediabetes: Secondary | ICD-10-CM

## 2022-11-08 DIAGNOSIS — S83242A Other tear of medial meniscus, current injury, left knee, initial encounter: Secondary | ICD-10-CM

## 2022-11-08 DIAGNOSIS — M1990 Unspecified osteoarthritis, unspecified site: Secondary | ICD-10-CM | POA: Insufficient documentation

## 2022-11-08 DIAGNOSIS — E039 Hypothyroidism, unspecified: Secondary | ICD-10-CM | POA: Insufficient documentation

## 2022-11-08 DIAGNOSIS — S83282A Other tear of lateral meniscus, current injury, left knee, initial encounter: Secondary | ICD-10-CM | POA: Insufficient documentation

## 2022-11-08 DIAGNOSIS — M2242 Chondromalacia patellae, left knee: Secondary | ICD-10-CM | POA: Diagnosis not present

## 2022-11-08 DIAGNOSIS — I447 Left bundle-branch block, unspecified: Secondary | ICD-10-CM | POA: Diagnosis not present

## 2022-11-08 DIAGNOSIS — I421 Obstructive hypertrophic cardiomyopathy: Secondary | ICD-10-CM | POA: Insufficient documentation

## 2022-11-08 DIAGNOSIS — M65862 Other synovitis and tenosynovitis, left lower leg: Secondary | ICD-10-CM | POA: Diagnosis not present

## 2022-11-08 DIAGNOSIS — Z79899 Other long term (current) drug therapy: Secondary | ICD-10-CM | POA: Diagnosis not present

## 2022-11-08 HISTORY — PX: KNEE ARTHROSCOPY: SHX127

## 2022-11-08 LAB — GLUCOSE, CAPILLARY: Glucose-Capillary: 115 mg/dL — ABNORMAL HIGH (ref 70–99)

## 2022-11-08 SURGERY — ARTHROSCOPY, KNEE
Anesthesia: General | Site: Knee | Laterality: Left

## 2022-11-08 MED ORDER — PROPOFOL 10 MG/ML IV BOLUS
INTRAVENOUS | Status: AC
  Filled 2022-11-08: qty 20

## 2022-11-08 MED ORDER — OXYCODONE HCL 5 MG/5ML PO SOLN: 5.0000 mg | Freq: Once | ORAL | Status: DC | PRN

## 2022-11-08 MED ORDER — ASPIRIN 81 MG PO TBEC: 81.0000 mg | DELAYED_RELEASE_TABLET | Freq: Two times a day (BID) | ORAL | 0 refills | Status: AC

## 2022-11-08 MED ORDER — CEFAZOLIN SODIUM-DEXTROSE 2-4 GM/100ML-% IV SOLN
2.0000 g | INTRAVENOUS | Status: AC
  Administered 2022-11-08: 2 g via INTRAVENOUS
  Filled 2022-11-08: qty 100

## 2022-11-08 MED ORDER — DEXAMETHASONE SODIUM PHOSPHATE 10 MG/ML IJ SOLN
INTRAMUSCULAR | Status: DC | PRN
  Administered 2022-11-08: 10 mg via INTRAVENOUS

## 2022-11-08 MED ORDER — FENTANYL CITRATE PF 50 MCG/ML IJ SOSY: 25.0000 ug | PREFILLED_SYRINGE | INTRAMUSCULAR | Status: DC | PRN

## 2022-11-08 MED ORDER — LACTATED RINGERS IV SOLN: INTRAVENOUS | Status: DC

## 2022-11-08 MED ORDER — ONDANSETRON HCL 4 MG/2ML IJ SOLN
INTRAMUSCULAR | Status: DC | PRN
  Administered 2022-11-08: 4 mg via INTRAVENOUS

## 2022-11-08 MED ORDER — FENTANYL CITRATE (PF) 100 MCG/2ML IJ SOLN
INTRAMUSCULAR | Status: AC
  Filled 2022-11-08: qty 2

## 2022-11-08 MED ORDER — HYDROCODONE-ACETAMINOPHEN 5-325 MG PO TABS: 1.0000 | ORAL_TABLET | Freq: Four times a day (QID) | ORAL | 0 refills | Status: DC | PRN

## 2022-11-08 MED ORDER — LIDOCAINE 2% (20 MG/ML) 5 ML SYRINGE
INTRAMUSCULAR | Status: DC | PRN
  Administered 2022-11-08: 60 mg via INTRAVENOUS

## 2022-11-08 MED ORDER — BUPIVACAINE HCL (PF) 0.25 % IJ SOLN
INTRAMUSCULAR | Status: AC
  Filled 2022-11-08: qty 30

## 2022-11-08 MED ORDER — PROPOFOL 10 MG/ML IV BOLUS
INTRAVENOUS | Status: DC | PRN
  Administered 2022-11-08: 120 mg via INTRAVENOUS

## 2022-11-08 MED ORDER — AMISULPRIDE (ANTIEMETIC) 5 MG/2ML IV SOLN: 10.0000 mg | Freq: Once | INTRAVENOUS | Status: DC | PRN

## 2022-11-08 MED ORDER — CHLORHEXIDINE GLUCONATE 0.12 % MT SOLN
15.0000 mL | Freq: Once | OROMUCOSAL | Status: AC
  Administered 2022-11-08: 15 mL via OROMUCOSAL

## 2022-11-08 MED ORDER — SODIUM CHLORIDE 0.9 % IR SOLN
Status: DC | PRN
  Administered 2022-11-08 (×2): 3000 mL

## 2022-11-08 MED ORDER — ONDANSETRON HCL 4 MG PO TABS: 4.0000 mg | ORAL_TABLET | Freq: Two times a day (BID) | ORAL | 0 refills | Status: DC | PRN

## 2022-11-08 MED ORDER — BUPIVACAINE HCL (PF) 0.25 % IJ SOLN
INTRAMUSCULAR | Status: DC | PRN
  Administered 2022-11-08: 30 mL

## 2022-11-08 MED ORDER — ORAL CARE MOUTH RINSE: 15.0000 mL | Freq: Once | OROMUCOSAL | Status: AC

## 2022-11-08 MED ORDER — LIDOCAINE HCL (PF) 2 % IJ SOLN
INTRAMUSCULAR | Status: AC
  Filled 2022-11-08: qty 5

## 2022-11-08 MED ORDER — OXYCODONE HCL 5 MG PO TABS: 5.0000 mg | ORAL_TABLET | Freq: Once | ORAL | Status: DC | PRN

## 2022-11-08 MED ORDER — FENTANYL CITRATE (PF) 100 MCG/2ML IJ SOLN
INTRAMUSCULAR | Status: DC | PRN
  Administered 2022-11-08: 100 ug via INTRAVENOUS

## 2022-11-08 SURGICAL SUPPLY — 38 items
BNDG CONFORM 6X.82 1P STRL (GAUZE/BANDAGES/DRESSINGS) IMPLANT
BNDG ELASTIC 4X5.8 VLCR STR LF (GAUZE/BANDAGES/DRESSINGS) ×1 IMPLANT
BNDG ELASTIC 6X10 VLCR STRL LF (GAUZE/BANDAGES/DRESSINGS) IMPLANT
BNDG ELASTIC 6X5.8 VLCR STR LF (GAUZE/BANDAGES/DRESSINGS) ×1 IMPLANT
BNDG GAUZE DERMACEA FLUFF 4 (GAUZE/BANDAGES/DRESSINGS) IMPLANT
BURR OVAL 8 FLU 4.0X13 (MISCELLANEOUS) ×1 IMPLANT
COVER SURGICAL LIGHT HANDLE (MISCELLANEOUS) ×1 IMPLANT
DISSECTOR 3.5MM X 13CM (MISCELLANEOUS) IMPLANT
DISSECTOR 4.0MM X 13CM (MISCELLANEOUS) IMPLANT
DRAPE ARTHROSCOPY W/POUCH 114 (DRAPES) IMPLANT
DRAPE U-SHAPE 47X51 STRL (DRAPES) ×1 IMPLANT
DRSG ADAPTIC 3X8 NADH LF (GAUZE/BANDAGES/DRESSINGS) IMPLANT
DRSG TEGADERM 2-3/8X2-3/4 SM (GAUZE/BANDAGES/DRESSINGS) IMPLANT
DURAPREP 26ML APPLICATOR (WOUND CARE) ×1 IMPLANT
ELECT REM PT RETURN 15FT ADLT (MISCELLANEOUS) ×1 IMPLANT
GAUZE 4X4 16PLY ~~LOC~~+RFID DBL (SPONGE) ×1 IMPLANT
GAUZE PAD ABD 8X10 STRL (GAUZE/BANDAGES/DRESSINGS) ×1 IMPLANT
GAUZE SPONGE 4X4 12PLY STRL (GAUZE/BANDAGES/DRESSINGS) ×1 IMPLANT
GLOVE BIOGEL PI IND STRL 7.5 (GLOVE) ×2 IMPLANT
GLOVE BIOGEL PI IND STRL 8.5 (GLOVE) ×1 IMPLANT
GLOVE ORTHO TXT STRL SZ7.5 (GLOVE) ×1 IMPLANT
GLOVE SURG ORTHO 8.5 STRL (GLOVE) ×1 IMPLANT
GOWN STRL REUS W/ TWL XL LVL3 (GOWN DISPOSABLE) ×1 IMPLANT
GOWN STRL REUS W/TWL XL LVL3 (GOWN DISPOSABLE) ×1
KIT BASIN OR (CUSTOM PROCEDURE TRAY) ×1 IMPLANT
KIT TURNOVER KIT A (KITS) IMPLANT
MANIFOLD NEPTUNE II (INSTRUMENTS) ×1 IMPLANT
NEEDLE HYPO 22GX1.5 SAFETY (NEEDLE) IMPLANT
PACK ARTHROSCOPY WL (CUSTOM PROCEDURE TRAY) ×1 IMPLANT
PADDING CAST COTTON 6X4 STRL (CAST SUPPLIES) ×1 IMPLANT
PORT APPOLLO RF 90DEGREE MULTI (SURGICAL WAND) IMPLANT
PROTECTOR NERVE ULNAR (MISCELLANEOUS) ×1 IMPLANT
STRIP CLOSURE SKIN 1/2X4 (GAUZE/BANDAGES/DRESSINGS) ×1 IMPLANT
SUT ETHILON 3 0 PS 1 (SUTURE) ×1 IMPLANT
SUT MNCRL AB 4-0 PS2 18 (SUTURE) IMPLANT
TOWEL OR 17X26 10 PK STRL BLUE (TOWEL DISPOSABLE) ×1 IMPLANT
TUBING ARTHROSCOPY IRRIG 16FT (MISCELLANEOUS) ×1 IMPLANT
WRAP KNEE MAXI GEL POST OP (GAUZE/BANDAGES/DRESSINGS) ×1 IMPLANT

## 2022-11-08 NOTE — Interval H&P Note (Signed)
History and Physical Interval Note:  11/08/2022 1:51 PM  Autumn Fitzgerald  has presented today for surgery, with the diagnosis of left knee medial meniscus tear.  The various methods of treatment have been discussed with the patient and family. After consideration of risks, benefits and other options for treatment, the patient has consented to  Procedure(s) with comments: ARTHROSCOPY KNEE (Left) - 60 min as a surgical intervention.  The patient's history has been reviewed, patient examined, no change in status, stable for surgery.  I have reviewed the patient's chart and labs.  Questions were answered to the patient's satisfaction.     [name]

## 2022-11-08 NOTE — Op Note (Unsigned)
NAMEMARIJAYNE, RAUTH MEDICAL RECORD NO: [id number] ACCOUNT NO: [id number] DATE OF BIRTH: June 14, 1946 FACILITY: [facility] LOCATION: [facility] PHYSICIAN: Doran Heater. Veverly Fells, MD  Operative Report   DATE OF PROCEDURE: 11/08/2022  PREOPERATIVE DIAGNOSIS:  Left knee medial meniscus tear.  POSTOPERATIVE DIAGNOSES:  Left knee medial meniscus tear as well as lateral meniscus tear and chondromalacia.  PROCEDURE PERFORMED:  Left knee arthroscopy with partial medial and lateral meniscectomies and chondroplasty in the medial compartment and patellofemoral compartment, debridement type.  ATTENDING SURGEON:  Doran Heater. Veverly Fells, MD  ASSISTANT:  None.  ANESTHESIA:  General anesthesia was used plus local.  ESTIMATED BLOOD LOSS:  Minimal.  FLUID REPLACEMENT:  1000 mL crystalloid.  COUNTS:  Instrument counts correct.  COMPLICATIONS:  No complications.  ANTIBIOTICS:  Perioperative antibiotics were given.  INDICATIONS:  The patient is a 76 year old female who presents with a history of worsening left medial-sided knee pain secondary to torn medial meniscus on MRI.  The patient has failed conservative management, desires operative treatment to eliminate  pain and restore function.  Informed consent obtained.  DESCRIPTION OF PROCEDURE:  After an adequate level of anesthesia was achieved, the patient was positioned supine on the operating room table.  Left leg correctly identified and sterilely prepped and draped in the usual manner.  Timeout called, verifying  correct patient, correct site.  Lateral post was utilized.  After verification of correct patient and correct site, we entered the knee using standard arthroscopic portals including superolateral outflow, anterolateral scope and anteromedial working  portals.  We identified extensive synovitis throughout the knee joint.  We drained the effusion, which appeared clear.  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  Loose flaps and fibrillation debrided using suction shaver back to smooth articular cartilage.  Patellar tracking was normal.  Trochlear wear was minimal. Medial and lateral gutters inspected and free of loose bodies.  There was a undersurface  posterior horn meniscus tear near the meniscal capsular junction.  This was unstable.  We performed a partial meniscectomy, removing about 40% to 50% of the posterior horn of the medial meniscus.  This was debrided up towards the mid body of the medial  meniscus.  Meniscal root integrity was intact.  There was grade 3 chondromalacia over a fairly large area of weightbearing portion of the medial femoral condyle.  We did a tangential chondroplasty, removing all unstable flaps and fibrillation. The tibial  cartilage was normal.  ACL and PCL intact.  Lateral compartment was entered.  There was noted to be a free edge lateral meniscus tear, debrided with baskets and motorized shaver.  About 20% of the meniscus had to be removed.  The remaining meniscus was  probed and felt to be stable.  The articular cartilage was in better shape in the lateral compartment with minimal chondromalacia.  I did a final lavage of the knee joint, followed by suturing with interrupted nylon suture followed by sterile dressing.   We did inject Marcaine after the knee surgery to help with postoperative pain control.  The patient had a compressive knee bandage placed and taken to recovery room in stable condition.   VAI D: 11/08/2022 3:48:01 pm T: 11/08/2022 9:43:00 pm  JOB: [id number]/ [id number]

## 2022-11-08 NOTE — Transfer of Care (Signed)
Immediate Anesthesia Transfer of Care Note  Patient: Autumn Fitzgerald  Procedure(s) Performed: ARTHROSCOPY KNEE; MEDIAL AND LATERAL MENISECTOMIES, CHONDROPLASTY (Left: Knee)  Patient Location: PACU  Anesthesia Type:General  Level of Consciousness: sedated  Airway & Oxygen Therapy: Patient Spontanous Breathing and Patient connected to face mask oxygen  Post-op Assessment: Report given to RN and Post -op Vital signs reviewed and stable  Post vital signs: Reviewed and stable  Last Vitals:  Vitals Value Taken Time  BP    Temp    Pulse 88 11/08/22 1528  Resp 12 11/08/22 1528  SpO2 98 % 11/08/22 1528  Vitals shown include unvalidated device data.  Last Pain:  Vitals:   11/08/22 1214  TempSrc:   PainSc: 0-No pain      Patients Stated Pain Goal: 3 (81/02/54 8628)  Complications: No notable events documented.

## 2022-11-08 NOTE — Discharge Instructions (Signed)
Ice to the knee constantly.  Keep the incisions covered and clean and dry for one week, then ok to get them wet in the shower.  Do exercise as instructed every hour, please to prevent stiffness.   Ankle pumps - move your ankle up and down Knee bending, bend to 90 degrees and then straighten your knee - back and forth for 10 repititions Tighten your thigh muscle and lift your leg (straight leg raise)    DO NOT prop anything under the knee, it will make your knee stiff.  Prop under the ankle to encourage your knee to go straight.   Use the walker while you are up and around for balance for first few days and take some weight off the knee to allow the knee to recover.  Wear your support stockings 24/7 to prevent blood clots and take baby aspirin twice daily for 30 days also to prevent blood clots  Follow up with Dr Veverly Fells in two weeks in the office, call [phone] for appt

## 2022-11-08 NOTE — Anesthesia Procedure Notes (Signed)
Procedure Name: LMA Insertion Date/Time: 11/08/2022 2:27 PM  Performed by: Lind Covert, CRNAPre-anesthesia Checklist: Patient identified, Emergency Drugs available, Suction available, Patient being monitored and Timeout performed Patient Re-evaluated:Patient Re-evaluated prior to induction Oxygen Delivery Method: Circle system utilized Preoxygenation: Pre-oxygenation with 100% oxygen Induction Type: IV induction LMA: LMA inserted LMA Size: 4.0 Tube type: Oral Number of attempts: 1 Placement Confirmation: positive ETCO2 and breath sounds checked- equal and bilateral Tube secured with: Tape Dental Injury: Teeth and Oropharynx as per pre-operative assessment

## 2022-11-08 NOTE — Anesthesia Postprocedure Evaluation (Signed)
Anesthesia Post Note  Patient: Autumn Fitzgerald  Procedure(s) Performed: ARTHROSCOPY KNEE; MEDIAL AND LATERAL MENISECTOMIES, CHONDROPLASTY (Left: Knee)     Patient location during evaluation: PACU Anesthesia Type: General Level of consciousness: awake and alert Pain management: pain level controlled Vital Signs Assessment: post-procedure vital signs reviewed and stable Respiratory status: spontaneous breathing, nonlabored ventilation and respiratory function stable Cardiovascular status: stable and blood pressure returned to baseline Anesthetic complications: no   No notable events documented.  Last Vitals:  Vitals:   11/08/22 1550 11/08/22 1600  BP:    Pulse: 96 96  Resp: 13 (!) 21  Temp:    SpO2: 97% 97%    Last Pain:  Vitals:   11/08/22 1600  TempSrc:   PainSc: 0-No pain                 Audry Pili

## 2022-11-08 NOTE — Brief Op Note (Signed)
11/08/2022  3:43 PM  PATIENT:  Autumn Fitzgerald  76 y.o. female  PRE-OPERATIVE DIAGNOSIS:  left knee medial meniscus tear  POST-OPERATIVE DIAGNOSIS:  left knee medial meniscus tear, lateral meniscus tear, chondromalacia left knee  PROCEDURE:  Procedure(s) with comments: ARTHROSCOPY KNEE; MEDIAL AND LATERAL MENISECTOMIES, CHONDROPLASTY (Left) - 60 min  SURGEON:  Surgeon(s) and Role:    Netta Cedars, MD - Primary  PHYSICIAN ASSISTANT:   ASSISTANTS: none   ANESTHESIA:   local and general  EBL:  minimal  BLOOD ADMINISTERED:none  DRAINS: none   LOCAL MEDICATIONS USED:  MARCAINE     SPECIMEN:  No Specimen  DISPOSITION OF SPECIMEN:  N/A  COUNTS:  YES  TOURNIQUET:  * No tourniquets in log *  DICTATION: .Other Dictation: Dictation Number [id number]  PLAN OF CARE: Discharge to home after PACU  PATIENT DISPOSITION:  PACU - hemodynamically stable.   Delay start of Pharmacological VTE agent (>24hrs) due to surgical blood loss or risk of bleeding: no

## 2022-11-09 ENCOUNTER — Encounter (HOSPITAL_COMMUNITY): Payer: Self-pay | Admitting: Orthopedic Surgery

## 2022-11-22 ENCOUNTER — Ambulatory Visit: Payer: Medicare HMO | Attending: Cardiovascular Disease | Admitting: Cardiovascular Disease

## 2022-11-22 ENCOUNTER — Encounter: Payer: Self-pay | Admitting: Cardiovascular Disease

## 2022-11-22 VITALS — BP 140/74 | HR 98 | Ht 60.0 in | Wt 131.0 lb

## 2022-11-22 DIAGNOSIS — I10 Essential (primary) hypertension: Secondary | ICD-10-CM | POA: Diagnosis not present

## 2022-11-22 DIAGNOSIS — I447 Left bundle-branch block, unspecified: Secondary | ICD-10-CM | POA: Diagnosis not present

## 2022-11-22 DIAGNOSIS — E782 Mixed hyperlipidemia: Secondary | ICD-10-CM

## 2022-11-22 DIAGNOSIS — E039 Hypothyroidism, unspecified: Secondary | ICD-10-CM

## 2022-11-22 DIAGNOSIS — I421 Obstructive hypertrophic cardiomyopathy: Secondary | ICD-10-CM | POA: Diagnosis not present

## 2022-11-22 NOTE — Patient Instructions (Signed)
Medication Instructions:   Take atenolol as prescribed  *If you need a refill on your cardiac medications before your next appointment, please call your pharmacy*     Follow-Up: At [facility], you and your health needs are our priority.  As part of our continuing mission to provide you with exceptional heart care, we have created designated Provider Care Teams.  These Care Teams include your primary Cardiologist (physician) and Advanced Practice Providers (APPs -  Physician Assistants and Nurse Practitioners) who all work together to provide you with the care you need, when you need it.  We recommend signing up for the patient portal called "MyChart".  Sign up information is provided on this After Visit Summary.  MyChart is used to connect with patients for Virtual Visits (Telemedicine).  Patients are able to view lab/test results, encounter notes, upcoming appointments, etc.  Non-urgent messages can be sent to your provider as well.   To learn more about what you can do with MyChart, go to [email or web address].    Your next appointment:   12 month(s)  The format for your next appointment:   In Person  Provider:   Sanda Klein, MD

## 2022-11-22 NOTE — Progress Notes (Signed)
Cardiology Office Note:    Date:  11/22/2022   ID:  Autumn Fitzgerald, DOB [birth year], MRN [id number]  PCP:  Nolene Ebbs, MD  Cardiologist:  Sanda Klein, MD  Electrophysiologist:  None   Referring MD: Nolene Ebbs, MD   Chief Complaint  Patient presents with   Chest Pain     History of Present Illness:    Autumn Fitzgerald is a 76 y.o. female with a hx of HOCM s/p septal myectomy in 2005, GERD, HTN, HLD, history of near syncope s/p loop recorder that was explanted in 2017, hypothyroidism, LBBB and prediabetes, meningioma.  Previous loop recorder did not show any meaningful arrhythmia. Patient had a normal Myoview in January 2020 as part of the cardiac clearance prior to surgery.   Echocardiogram obtained on 09/10/2019 showed EF 60 to 65%, moderately increased left ventricular posterior wall thickness and septal wall thickness, elevated LVEDP.   She was seen in the emergency room on 07/29/2022 with complaints of dizziness and shortness of breath, after having received some scalp injections for migraine headaches.  Her D-dimer was mildly abnormal so she underwent a CT angiogram of the chest to exclude pulmonary embolism, with normal findings.  Mention was made of mild atherosclerotic calcification within the coronaries in the thoracic aorta, but no aortic aneurysm.  She underwent uncomplicated arthroscopic surgery of her left knee for a left medial meniscus tear on 11/08/2022.  During that procedure her heart rate increased to 150 bpm.  She had not been taking her atenolol prescription.  She complains of increased palpitations that she feels as pressure in her chest and throat, again she has not been taking her atenolol prescription.  No change in her chronic complaints of shortness of breath with activity.  Denies edema, orthopnea, PND.  Has not had syncope.  She tells me that the chest pressure and shortness of breath are new complaints, but she has had these complaints for  years.  Most recent lipid profile showed an LDL cholesterol of 97, most recent hemoglobin A1c performed just last month was 5.5%.  It is normal at 0.73 and potassium was normal at 3.8.  She is generally very sensitive to low doses of meds (including beta blockers and diuretics). Although she could not tolerate a full 25 mg daily dose of atenolol due to dizziness and weakness, she feels better on 12.5 mg of atenolol.  It helps prevent palpitations and limits episodes of chest pain.  Had dyspnea off diuretics, but taking torsemide more than 5 mg daily causes weakness.  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)    Hypothyroidism    followed by pcp   LBBB (left bundle branch block)    Mild persistent allergic asthma    Palpitations    Panic attacks    Pneumonia    PONV (postoperative nausea and vomiting)    Pre-diabetes    Wears glasses     Past Surgical History:  Procedure Laterality Date   CARDIAC SURGERY  02/05/2007  in [place]  for HOCM   CATARACT EXTRACTION W/ INTRAOCULAR LENS  IMPLANT, BILATERAL  right 07-23-2017;  left 10-15-2017   COLONOSCOPY N/A 11/24/2013   Procedure: COLONOSCOPY;  Surgeon: Juanita Craver, MD;  Location: [facility];  Service: Endoscopy;  Laterality: N/A;   DOPPLER ECHOCARDIOGRAPHY  03/14/2013   EF 65-70%,trivial AI,mild-mod. MR,Mod. TR,mod. concentric hypertrophy   EP IMPLANTABLE DEVICE N/A 08/22/2016   Procedure: Loop Recorder Removal;  Surgeon: Sanda Klein, MD;  Location: [facility];  Service: Cardiovascular;  Laterality: N/A;    ESOPHAGOGASTRODUODENOSCOPY N/A 11/24/2013   Procedure: ESOPHAGOGASTRODUODENOSCOPY (EGD);  Surgeon: Juanita Craver, MD;  Location: [facility];  Service: Endoscopy;  Laterality: N/A;   GLAUCOMA SURGERY Bilateral 2019   "release pressure   KNEE ARTHROSCOPY Right 10/17/2013   Procedure: ARTHROSCOPY RIGHT KNEE;  Surgeon: [name], MD;  Location: [facility];  Service: Orthopedics;  Laterality: Right;   KNEE ARTHROSCOPY Left 11/08/2022   Procedure: ARTHROSCOPY KNEE; MEDIAL AND LATERAL MENISECTOMIES, CHONDROPLASTY;  Surgeon: Netta Cedars, MD;  Location: [facility];  Service: Orthopedics;  Laterality: Left;  60 min   LAPAROSCOPIC CHOLECYSTECTOMY  02-03-2010   dr d blackman  '@[facility]'$    LOOP RECORDER IMPLANT  03/18/2013   LOOP RECORDER IMPLANT N/A 03/18/2013   Procedure: LOOP RECORDER IMPLANT;  Surgeon: Sanda Klein, MD;  Location: [facility];  Service: Cardiovascular;  Laterality: N/A;   SHOULDER ARTHROSCOPY WITH ROTATOR CUFF REPAIR Right 01/16/2019   Procedure: Right shoulder arthroscopy with subacromial decompression, distal clavicle resection, rotator cuff repair, bicep tenotomy;  Surgeon: Justice Britain, MD;  Location: [facility];  Service: Orthopedics;  Laterality: Right;   TUBAL LIGATION  yrs ago    Current Medications: Current Meds  Medication Sig   acetaminophen (TYLENOL) 500 MG tablet Take 500 mg by mouth every 6 (six) hours as needed for moderate pain or headache.   albuterol (VENTOLIN HFA) 108 (90 Base) MCG/ACT inhaler INHALE 2 PUFFS BY MOUTH INTO LUNGS EVERY 4 TO 6 HOURS AS NEEDED FOR WHEEZING/COUGH/SHORTNESS OF BREATH   aspirin EC 81 MG tablet Take 1 tablet (81 mg total) by mouth 2 (two) times daily. Swallow whole.   Boswellia-Glucosamine-Vit D (OSTEO BI-FLEX ONE PER DAY PO) Take 1 tablet by mouth.   Carboxymethylcellul-Glycerin (REFRESH RELIEVA OP) Place 1 drop into both eyes daily.   Cholecalciferol 50 MCG (2000 UT) TABS Take 2,000 Units by mouth daily.   clobetasol ointment (TEMOVATE) 0.05 % Apply  to affected area every night for 4 weeks, then every other day for 4 weeks and then twice a week for 4 weeks or until resolution. (Patient taking differently: Apply 1 Application topically 3 (three) times a week.)   clonazePAM (KLONOPIN) 2 MG tablet Take 2 mg by mouth daily as needed.   ezetimibe-simvastatin (VYTORIN) 10-40 MG per tablet Take 1 tablet by mouth every evening.    FLOVENT HFA 44 MCG/ACT inhaler Inhale 2 puffs into the lungs daily. INHALE 2 PUFFS INTO THE LUNGS DAILY WITH SPACER.   levothyroxine (SYNTHROID) 50 MCG tablet Take 50 mcg by mouth daily.    Multiple Vitamin (MULTIVITAMIN) tablet Take 1 tablet by mouth daily.   pantoprazole (PROTONIX) 40 MG tablet Take 40 mg by mouth daily.   Polyethyl Glycol-Propyl Glycol 0.4-0.3 % SOLN Place one drop in both eyes daily as needed   torsemide (DEMADEX) 5 MG tablet Take 5 mg by mouth daily.     Allergies:   Amoxicillin-pot clavulanate, Temazepam, Lipitor [atorvastatin calcium], Penicillin g,  and Lansoprazole   Social History   Socioeconomic History   Marital status: Divorced    Spouse name: Not on file   Number of children: 5   Years of education: 10   Highest education level: Not on file  Occupational History   Not on file  Tobacco Use   Smoking status: Never   Smokeless tobacco: Never  Vaping Use   Vaping Use: Never used  Substance and Sexual Activity   Alcohol use: No   Drug use: No   Sexual activity: Never    Birth control/protection: Post-menopausal, Surgical    Comment: divorced  Other Topics Concern   Not on file  Social History Narrative   Lives at home by herself.    Caffeine use: none    Social Determinants of Radio broadcast assistant Strain: Not on file  Food Insecurity: Not on file  Transportation Needs: Not on file  Physical Activity: Not on file  Stress: Not on file  Social Connections: Not on file     Family History: The patient's family history includes Breast cancer in her mother; Cancer in  her brother, father, and mother; Diabetes in her sister; Heart attack in her sister; Hypertension in her mother. There is no history of Migraines.  ROS:   Please see the history of present illness.    All other systems are reviewed and are negative.   EKGs/Labs/Other Studies Reviewed:    The following studies were reviewed today:  Myoview 12/24/2018 The left ventricular ejection fraction is hyperdynamic (>65%). Nuclear stress EF: 80%. There was no ST segment deviation noted during stress. The study is normal. This is a low risk study.   Normal pharmacologic nuclear stress test with no evidence for prior infarct or ischemia. Normal LVEF.   Echo 07/06/2022   1. Hyperdynamic contraction, HOCM post septal myomectomy - peak velocity  2.7 m/s, PG 28 mmHg with Valsalva. Left ventricular ejection fraction, by  estimation, is >75%. The left ventricle has hyperdynamic function. The  left ventricle has no regional wall   motion abnormalities. There is mild left ventricular hypertrophy. Left  ventricular diastolic parameters are consistent with Grade I diastolic  dysfunction (impaired relaxation). The average left ventricular global  longitudinal strain is -19.7 %. The  global longitudinal strain is normal.   2. Right ventricular systolic function is normal. The right ventricular  size is normal. There is normal pulmonary artery systolic pressure. The  estimated right ventricular systolic pressure is 40.8 mmHg.   3. No SAM. The mitral valve is normal in structure. Mild mitral valve  regurgitation. No evidence of mitral stenosis.   4. Tricuspid valve regurgitation is severe.   5. The aortic valve is normal in structure. Aortic valve regurgitation is  mild. No aortic stenosis is present.   6. Pulmonic valve regurgitation is moderate.   7. The inferior vena cava is normal in size with greater than 50%  respiratory variability, suggesting right atrial pressure of 3 mmHg.   EKG:  EKG is  ordered today.  ECG is unchanged, NSR and LBBB.  Recent Labs: 07/29/2022: Hemoglobin 14.2; Platelets 276 10/27/2022: BUN 12; Creatinine, Ser 0.73; Potassium 3.8; Sodium 140  Recent Lipid Panel    Component Value Date/Time   CHOL 190 01/03/2021 0231   TRIG 305 (H) 01/03/2021 0231   HDL 52 01/03/2021 0231   CHOLHDL 3.7 01/03/2021 0231   VLDL 19 09/23/2008 2026   LDLCALC 97 01/03/2021 0231    Physical Exam:  VS:  BP (!) 140/74 (BP Location: Left Arm, Patient Position: Sitting, Cuff Size: Normal)   Pulse 98   Ht 5' (1.524 m)   Wt 59.4 kg   SpO2 97%   BMI 25.58 kg/m     Wt Readings from Last 3 Encounters:  11/22/22 59.4 kg  11/08/22 59.2 kg  10/27/22 59.2 kg       General: Alert, oriented x3, no distress, appears well.  Well-healed sternotomy scar. Head: no evidence of trauma, PERRL, EOMI, no exophtalmos or lid lag, no myxedema, no xanthelasma; normal ears, nose and oropharynx Neck: normal jugular venous pulsations and no hepatojugular reflux; brisk carotid pulses without delay and no carotid bruits Chest: clear to auscultation, no signs of consolidation by percussion or palpation, normal fremitus, symmetrical and full respiratory excursions Cardiovascular: normal position and quality of the apical impulse, regular rhythm, normal first and second heart sounds, no murmurs, rubs or gallops Abdomen: no tenderness or distention, no masses by palpation, no abnormal pulsatility or arterial bruits, normal bowel sounds, no hepatosplenomegaly Extremities: no clubbing, cyanosis or edema; 2+ radial, ulnar and brachial pulses bilaterally; 2+ right femoral, posterior tibial and dorsalis pedis pulses; 2+ left femoral, posterior tibial and dorsalis pedis pulses; no subclavian or femoral bruits Neurological: grossly nonfocal Psych: Normal mood and affect      ASSESSMENT:    1. HOCM (hypertrophic obstructive cardiomyopathy) ([place])   2. LBBB (left bundle branch block)   3. Essential  hypertension   4. Mixed hyperlipidemia   5. Acquired hypothyroidism       PLAN:    In order of problems listed above:  HOCM s/p septal myectomy: Could well have subendocardial ischemia due to severe LVH, but there is no evidence of significant LV outflow tract obstruction by echo and no murmur heard on exam even with provocative maneuvers.  No evidence of ischemia on recent nuclear stress testing.  Never had documentation of malignant ventricular arrhythmia, including during 3 years of loop recorder monitoring.  Explained the rationale for beta-blocker therapy in hypertrophic cardiomyopathy to prevent heart failure, arrhythmia, worsening symptoms during adrenaline surges such as emotions/pain/anesthesia and surgery.  Should not interrupt her atenolol.   LBBB: Longstanding conduction abnormality.  Relatively narrow QRS. Hypertension: controlled.  Prefer beta-blockers. Hyperlipidemia: Labs monitored by PCP. Hypothyroidism: Appears clinically euthyroid.  Most recent TSH was in normal range.    Medication Adjustments/Labs and Tests Ordered: Current medicines are reviewed at length with the patient today.  Concerns regarding medicines are outlined above.  No orders of the defined types were placed in this encounter.   No orders of the defined types were placed in this encounter.    Patient Instructions  Medication Instructions:   Take atenolol as prescribed  *If you need a refill on your cardiac medications before your next appointment, please call your pharmacy*     Follow-Up: At [facility], you and your health needs are our priority.  As part of our continuing mission to provide you with exceptional heart care, we have created designated Provider Care Teams.  These Care Teams include your primary Cardiologist (physician) and Advanced Practice Providers (APPs -  Physician Assistants and Nurse Practitioners) who all work together to provide you with the care you need,  when you need it.  We recommend signing up for the patient portal called "MyChart".  Sign up information is provided on this After Visit Summary.  MyChart is used to connect with patients for Virtual Visits (Telemedicine).  Patients are able  to view lab/test results, encounter notes, upcoming appointments, etc.  Non-urgent messages can be sent to your provider as well.   To learn more about what you can do with MyChart, go to [email or web address].    Your next appointment:   12 month(s)  The format for your next appointment:   In Person  Provider:   Sanda Klein, MD         Signed, Sanda Klein, MD  11/22/2022 10:52 AM    [place]

## 2022-12-26 DIAGNOSIS — F333 Major depressive disorder, recurrent, severe with psychotic symptoms: Secondary | ICD-10-CM | POA: Diagnosis not present

## 2023-01-10 ENCOUNTER — Other Ambulatory Visit: Payer: Self-pay | Admitting: Allergy and Immunology

## 2023-01-29 DIAGNOSIS — I5032 Chronic diastolic (congestive) heart failure: Secondary | ICD-10-CM | POA: Diagnosis not present

## 2023-01-29 DIAGNOSIS — R7303 Prediabetes: Secondary | ICD-10-CM | POA: Diagnosis not present

## 2023-01-29 DIAGNOSIS — E039 Hypothyroidism, unspecified: Secondary | ICD-10-CM | POA: Diagnosis not present

## 2023-01-29 DIAGNOSIS — I10 Essential (primary) hypertension: Secondary | ICD-10-CM | POA: Diagnosis not present

## 2023-01-29 DIAGNOSIS — Z1211 Encounter for screening for malignant neoplasm of colon: Secondary | ICD-10-CM | POA: Diagnosis not present

## 2023-01-29 DIAGNOSIS — J302 Other seasonal allergic rhinitis: Secondary | ICD-10-CM | POA: Diagnosis not present

## 2023-01-29 DIAGNOSIS — E7849 Other hyperlipidemia: Secondary | ICD-10-CM | POA: Diagnosis not present

## 2023-01-29 DIAGNOSIS — Z0000 Encounter for general adult medical examination without abnormal findings: Secondary | ICD-10-CM | POA: Diagnosis not present

## 2023-01-29 DIAGNOSIS — J4520 Mild intermittent asthma, uncomplicated: Secondary | ICD-10-CM | POA: Diagnosis not present

## 2023-01-30 ENCOUNTER — Ambulatory Visit: Payer: Medicare HMO | Admitting: Allergy and Immunology

## 2023-02-07 DIAGNOSIS — H43813 Vitreous degeneration, bilateral: Secondary | ICD-10-CM | POA: Diagnosis not present

## 2023-02-07 DIAGNOSIS — H40033 Anatomical narrow angle, bilateral: Secondary | ICD-10-CM | POA: Diagnosis not present

## 2023-02-12 DIAGNOSIS — I10 Essential (primary) hypertension: Secondary | ICD-10-CM | POA: Diagnosis not present

## 2023-02-12 DIAGNOSIS — I5032 Chronic diastolic (congestive) heart failure: Secondary | ICD-10-CM | POA: Diagnosis not present

## 2023-02-12 DIAGNOSIS — E039 Hypothyroidism, unspecified: Secondary | ICD-10-CM | POA: Diagnosis not present

## 2023-02-12 DIAGNOSIS — J4520 Mild intermittent asthma, uncomplicated: Secondary | ICD-10-CM | POA: Diagnosis not present

## 2023-02-16 DIAGNOSIS — H524 Presbyopia: Secondary | ICD-10-CM | POA: Diagnosis not present

## 2023-02-20 DIAGNOSIS — F333 Major depressive disorder, recurrent, severe with psychotic symptoms: Secondary | ICD-10-CM | POA: Diagnosis not present

## 2023-02-27 ENCOUNTER — Other Ambulatory Visit: Payer: Self-pay

## 2023-02-27 ENCOUNTER — Ambulatory Visit (INDEPENDENT_AMBULATORY_CARE_PROVIDER_SITE_OTHER): Payer: Medicare HMO | Admitting: Allergy and Immunology

## 2023-02-27 VITALS — BP 130/62 | HR 86 | Temp 98.1°F | Resp 16 | Ht 60.63 in | Wt 133.4 lb

## 2023-02-27 DIAGNOSIS — J3089 Other allergic rhinitis: Secondary | ICD-10-CM | POA: Diagnosis not present

## 2023-02-27 DIAGNOSIS — J4540 Moderate persistent asthma, uncomplicated: Secondary | ICD-10-CM

## 2023-02-27 DIAGNOSIS — K219 Gastro-esophageal reflux disease without esophagitis: Secondary | ICD-10-CM

## 2023-02-27 MED ORDER — REFRESH RELIEVA 0.5-0.9 % OP SOLN: 1.0000 [drp] | Freq: Every day | OPHTHALMIC | 1 refills | Status: DC

## 2023-02-27 MED ORDER — FLUTICASONE PROPIONATE 50 MCG/ACT NA SUSP: 1.0000 | Freq: Every day | NASAL | 5 refills | Status: DC

## 2023-02-27 MED ORDER — SYSTANE COMPLETE 0.6 % OP SOLN: 1.0000 [drp] | Freq: Three times a day (TID) | OPHTHALMIC | 1 refills | Status: DC | PRN

## 2023-02-27 MED ORDER — PANTOPRAZOLE SODIUM 40 MG PO TBEC: 40.0000 mg | DELAYED_RELEASE_TABLET | Freq: Every morning | ORAL | 1 refills | Status: DC

## 2023-02-27 MED ORDER — ALBUTEROL SULFATE HFA 108 (90 BASE) MCG/ACT IN AERS: 2.0000 | INHALATION_SPRAY | RESPIRATORY_TRACT | 1 refills | Status: DC | PRN

## 2023-02-27 MED ORDER — ARNUITY ELLIPTA 100 MCG/ACT IN AEPB: 1.0000 | INHALATION_SPRAY | Freq: Every day | RESPIRATORY_TRACT | 5 refills | Status: DC

## 2023-02-27 NOTE — Progress Notes (Signed)
[facility] [place] [facility] [place]   Follow-up Note  Referring Provider: Nolene Ebbs, MD Primary Provider: Nolene Ebbs, MD Date of Office Visit: 02/27/2023  Subjective:   Autumn Fitzgerald (DOB: 05/25/1946) is a 77 y.o. female who returns to the [facility] on 02/27/2023 in re-evaluation of the following:  HPI: [name] returns to the clinic in evaluation of asthma, allergic rhinitis, LPR.  Her last visit to this clinic was 18 July 2022.  She apparently developed a viral respiratory tract illness at the beginning of February affecting both her nose and chest and making her "sick" and extremely achy for which she went to see her primary care doctor who treated her with medications although Nold is not entirely sure exactly what she received.  Fortunately, she resolved that issue.  She is having some problems with her insurance company with her inhaled steroid.  It seems as though she will need to eliminate the use of her inhaled generic fluticasone and start Arnuity.  Overall she has done pretty well regarding her asthma and rarely uses a short acting bronchodilator.  She does not really exercise to any degree.  She has had very little problems with her nose.  She does use a nasal steroid.  She believes that her reflux is under pretty good control on her current therapy.  Allergies as of 02/27/2023       Reactions   Amoxicillin-pot Clavulanate Other (See Comments)   Severe pains and GI symptoms Did it involve swelling of the face/tongue/throat, SOB, or low BP? No Did it involve sudden or severe rash/hives, skin peeling, or any reaction on the inside of your mouth or nose? No Did you need to seek medical attention at a hospital or doctor's office? No When did it last happen?      within the past 10 years If all above answers are "NO", may proceed with cephalosporin use.   Temazepam Other (See Comments)   Dizziness and doesn't work for patient    Lipitor [atorvastatin Calcium]    Muscle pain   Penicillin G Other (See Comments)   Unknown reaction    Lansoprazole Palpitations   "Body felt sick"         Medication List    acetaminophen 500 MG tablet Commonly known as: TYLENOL Take 500 mg by mouth every 6 (six) hours as needed for moderate pain or headache.   albuterol 108 (90 Base) MCG/ACT inhaler Commonly known as: VENTOLIN HFA INHALE 2 PUFFS INTO THE LUNGS EVERY 4 TO 6 HOURS AS NEEDED FOR WHEEZING / COUGH / SHORTNESS OF BREATH   atenolol 25 MG tablet Commonly known as: TENORMIN Take 0.5 tablets (12.5 mg total) by mouth daily.   Cholecalciferol 50 MCG (2000 UT) Tabs Take 2,000 Units by mouth daily.   clobetasol ointment 0.05 % Commonly known as: TEMOVATE Apply to affected area every night for 4 weeks, then every other day for 4 weeks and then twice a week for 4 weeks or until resolution.   clonazePAM 2 MG tablet Commonly known as: KLONOPIN Take 2 mg by mouth daily as needed.   ezetimibe-simvastatin 10-40 MG tablet Commonly known as: VYTORIN Take 1 tablet by mouth every evening.   Flovent HFA 44 MCG/ACT inhaler Generic drug: fluticasone Inhale 2 puffs into the lungs daily. INHALE 2 PUFFS INTO THE LUNGS DAILY WITH SPACER.   fluticasone 50 MCG/ACT nasal spray Commonly known as: Flonase Place 1 spray into both nostrils daily.  HYDROcodone-acetaminophen 5-325 MG tablet Commonly known as: Norco Take 1 tablet by mouth every 6 (six) hours as needed for moderate pain or severe pain.   levothyroxine 50 MCG tablet Commonly known as: SYNTHROID Take 50 mcg by mouth daily.   multivitamin tablet Take 1 tablet by mouth daily.   ondansetron 4 MG tablet Commonly known as: Zofran Take 1 tablet (4 mg total) by mouth 2 (two) times daily as needed for nausea, vomiting or refractory nausea / vomiting.   OSTEO BI-FLEX ONE PER DAY PO Take 1 tablet by mouth.   pantoprazole 40 MG tablet Commonly known as:  PROTONIX Take 40 mg by mouth daily.   Polyethyl Glycol-Propyl Glycol 0.4-0.3 % Soln Place one drop in both eyes daily as needed   REFRESH RELIEVA OP Place 1 drop into both eyes daily.   torsemide 5 MG tablet Commonly known as: DEMADEX Take 5 mg by mouth daily.        Past Medical History:  Diagnosis Date  . Arthritis   . CHF (congestive heart failure) ([place])   . Depression   . GERD (gastroesophageal reflux disease)   . Glaucoma, both eyes   . Head injury, closed, without LOC 02/2013   did not LOC per patient  . Headache   . History of syncope 2014   near syncope,  loop recorder placed and explanted 2017  . HLD (hyperlipidemia)   . Hypertension    "at times"    . 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 Hypothyroidism    followed by pcp  . LBBB (left bundle branch block)   . Mild persistent allergic asthma   . Palpitations   . Panic attacks   . Pneumonia   . PONV (postoperative nausea and vomiting)   . Pre-diabetes   . Wears glasses     Past Surgical History:  Procedure Laterality Date  . CARDIAC SURGERY  02/05/2007   in [place]  for HOCM  . CATARACT EXTRACTION W/ INTRAOCULAR LENS  IMPLANT, BILATERAL  right 07-23-2017;  left 10-15-2017  . COLONOSCOPY N/A 11/24/2013   Procedure: COLONOSCOPY;  Surgeon: Juanita Craver, MD;  Location: [facility];  Service: Endoscopy;  Laterality: N/A;  . DOPPLER ECHOCARDIOGRAPHY  03/14/2013   EF 65-70%,trivial AI,mild-mod. MR,Mod. TR,mod. concentric hypertrophy  . EP IMPLANTABLE DEVICE N/A 08/22/2016   Procedure: Loop Recorder Removal;  Surgeon: Sanda Klein, MD;  Location: [facility];  Service: Cardiovascular;  Laterality: N/A;  . ESOPHAGOGASTRODUODENOSCOPY N/A 11/24/2013   Procedure: ESOPHAGOGASTRODUODENOSCOPY (EGD);  Surgeon: Juanita Craver, MD;  Location: [facility];  Service: Endoscopy;  Laterality: N/A;  . GLAUCOMA SURGERY Bilateral 2019   "release pressure  . KNEE ARTHROSCOPY Right  10/17/2013   Procedure: ARTHROSCOPY RIGHT KNEE;  Surgeon: [name], MD;  Location: [facility];  Service: Orthopedics;  Laterality: Right;  . KNEE ARTHROSCOPY Left 11/08/2022   Procedure: ARTHROSCOPY KNEE; MEDIAL AND LATERAL MENISECTOMIES, CHONDROPLASTY;  Surgeon: Netta Cedars, MD;  Location: [facility];  Service: Orthopedics;  Laterality: Left;  60 min  . LAPAROSCOPIC CHOLECYSTECTOMY  02-03-2010   dr d blackman  @[facility]   . LOOP RECORDER IMPLANT  03/18/2013  . LOOP RECORDER IMPLANT N/A 03/18/2013   Procedure: LOOP RECORDER IMPLANT;  Surgeon: Sanda Klein, MD;  Location: [facility];  Service: Cardiovascular;  Laterality: N/A;  . SHOULDER ARTHROSCOPY WITH ROTATOR CUFF REPAIR Right 01/16/2019   Procedure: Right shoulder arthroscopy with subacromial decompression, distal clavicle resection, rotator cuff repair, bicep tenotomy;  Surgeon:  Justice Britain, MD;  Location: [facility];  Service: Orthopedics;  Laterality: Right;  . TUBAL LIGATION  yrs ago    Review of systems negative except as noted in HPI / PMHx or noted below:  Review of Systems  Constitutional: Negative.   HENT: Negative.    Eyes: Negative.   Respiratory: Negative.    Cardiovascular: Negative.   Gastrointestinal: Negative.   Genitourinary: Negative.   Musculoskeletal: Negative.   Skin: Negative.   Neurological: Negative.   Endo/Heme/Allergies: Negative.   Psychiatric/Behavioral: Negative.       Objective:   Vitals:   02/27/23 1217  BP: 130/62  Pulse: 86  Resp: 16  Temp: 98.1 F (36.7 C)  SpO2: 97%   Height: 5' 0.63" (154 cm)  Weight: 133 lb 6.4 oz (60.5 kg)   Physical Exam Constitutional:      Appearance: She is not diaphoretic.  HENT:     Head: Normocephalic.     Right Ear: Tympanic membrane, ear canal and external ear normal.     Left Ear: Tympanic membrane, ear canal and external ear normal.     Nose: Nose normal. No mucosal edema or rhinorrhea.     Mouth/Throat:     Pharynx: Uvula midline. No oropharyngeal  exudate.  Eyes:     Conjunctiva/sclera: Conjunctivae normal.  Neck:     Thyroid: No thyromegaly.     Trachea: Trachea normal. No tracheal tenderness or tracheal deviation.  Cardiovascular:     Rate and Rhythm: Normal rate and regular rhythm.     Heart sounds: Normal heart sounds, S1 normal and S2 normal. No murmur heard. Pulmonary:     Effort: No respiratory distress.     Breath sounds: Normal breath sounds. No stridor. No wheezing or rales.  Lymphadenopathy:     Head:     Right side of head: No tonsillar adenopathy.     Left side of head: No tonsillar adenopathy.     Cervical: No cervical adenopathy.  Skin:    Findings: No erythema or rash.     Nails: There is no clubbing.  Neurological:     Mental Status: She is alert.    Diagnostics:    Spirometry was performed and demonstrated an FEV1 of 1.21 at 75 % of predicted.   Assessment and Plan:   1. Asthma, moderate persistent, well-controlled   2. Other allergic rhinitis   3. LPRD (laryngopharyngeal reflux disease)      1.  START Arnuity 100 - 1 inhalation 1 time per day (replaces fluticasone / Flovent)  2.  Continue Flonase - 1 spray each nostril 1-7 times per week  3.  Continue pantoprazole 40 mg in a.m.    4.  Can use nasal saline and systane/ refresh eye drops if needed.  5.  Can use albuterol HFA 2 puffs every 4-6 hours if needed.  6. Can increase Arnuity and Flonase to 2 times per day during "Flare up"  7. Return to clinic in 6 months or earlier if problem  Veretta will continue on anti-inflammatory agents for her airway on a pretty consistent basis and also continue to treat her reflux with a proton pump inhibitor and assuming she does well with this plan we will see her back in this clinic in 6 months.  She does have a action plan to initiate if she develops a flareup which includes doubling her dose of Arnuity and Flonase.   Allena Katz, MD [facility] [place]

## 2023-02-27 NOTE — Patient Instructions (Addendum)
  1.  START Arnuity 100 - 1 inhalation 1 time per day (replaces fluticasone / Flovent)  2.  Continue Flonase - 1 spray each nostril 1-7 times per week  3.  Continue pantoprazole 40 mg in a.m.    4.  Can use nasal saline and systane/ refresh eye drops if needed.  5.  Can use albuterol HFA 2 puffs every 4-6 hours if needed.  6. Can increase Arnuity and Flonase to 2 times per day during "Flare up"  7. Return to clinic in 6 months or earlier if problem

## 2023-02-28 ENCOUNTER — Encounter: Payer: Self-pay | Admitting: Allergy and Immunology

## 2023-03-01 DIAGNOSIS — F333 Major depressive disorder, recurrent, severe with psychotic symptoms: Secondary | ICD-10-CM | POA: Diagnosis not present

## 2023-03-28 DIAGNOSIS — F333 Major depressive disorder, recurrent, severe with psychotic symptoms: Secondary | ICD-10-CM | POA: Diagnosis not present

## 2023-05-01 DIAGNOSIS — F333 Major depressive disorder, recurrent, severe with psychotic symptoms: Secondary | ICD-10-CM | POA: Diagnosis not present

## 2023-05-14 DIAGNOSIS — I781 Nevus, non-neoplastic: Secondary | ICD-10-CM | POA: Diagnosis not present

## 2023-05-14 DIAGNOSIS — E7849 Other hyperlipidemia: Secondary | ICD-10-CM | POA: Diagnosis not present

## 2023-05-14 DIAGNOSIS — I10 Essential (primary) hypertension: Secondary | ICD-10-CM | POA: Diagnosis not present

## 2023-05-14 DIAGNOSIS — E038 Other specified hypothyroidism: Secondary | ICD-10-CM | POA: Diagnosis not present

## 2023-05-14 DIAGNOSIS — J4520 Mild intermittent asthma, uncomplicated: Secondary | ICD-10-CM | POA: Diagnosis not present

## 2023-05-14 DIAGNOSIS — K219 Gastro-esophageal reflux disease without esophagitis: Secondary | ICD-10-CM | POA: Diagnosis not present

## 2023-05-14 DIAGNOSIS — R7303 Prediabetes: Secondary | ICD-10-CM | POA: Diagnosis not present

## 2023-05-16 ENCOUNTER — Other Ambulatory Visit: Payer: Self-pay | Admitting: Allergy and Immunology

## 2023-05-16 ENCOUNTER — Telehealth: Payer: Self-pay | Admitting: Allergy and Immunology

## 2023-05-16 NOTE — Telephone Encounter (Signed)
Patient called and needs to have albuterol inhaler  called into [facility]. [phone].

## 2023-05-17 NOTE — Telephone Encounter (Signed)
Albuterol Inhaler was sent in on 05/16/23 by a nurse to [facility] [id number] - [address], [place] [address]    I left a detailed message to notify the patient.

## 2023-05-28 DIAGNOSIS — F333 Major depressive disorder, recurrent, severe with psychotic symptoms: Secondary | ICD-10-CM | POA: Diagnosis not present

## 2023-06-06 DIAGNOSIS — H40033 Anatomical narrow angle, bilateral: Secondary | ICD-10-CM | POA: Diagnosis not present

## 2023-06-06 DIAGNOSIS — H43813 Vitreous degeneration, bilateral: Secondary | ICD-10-CM | POA: Diagnosis not present

## 2023-07-17 ENCOUNTER — Other Ambulatory Visit: Payer: Self-pay | Admitting: Allergy and Immunology

## 2023-08-03 ENCOUNTER — Ambulatory Visit (INDEPENDENT_AMBULATORY_CARE_PROVIDER_SITE_OTHER): Payer: Medicare HMO

## 2023-08-03 ENCOUNTER — Ambulatory Visit (HOSPITAL_COMMUNITY)
Admission: EM | Admit: 2023-08-03 | Discharge: 2023-08-03 | Disposition: A | Discharge status: Home / Self Care | Payer: Medicare HMO | Attending: Physician Assistant | Admitting: Physician Assistant

## 2023-08-03 ENCOUNTER — Encounter (HOSPITAL_COMMUNITY): Payer: Self-pay

## 2023-08-03 DIAGNOSIS — M5030 Other cervical disc degeneration, unspecified cervical region: Secondary | ICD-10-CM

## 2023-08-03 DIAGNOSIS — M62838 Other muscle spasm: Secondary | ICD-10-CM | POA: Diagnosis not present

## 2023-08-03 MED ORDER — LIDOCAINE 5 % EX PTCH: 1.0000 | MEDICATED_PATCH | CUTANEOUS | 0 refills | Status: DC

## 2023-08-03 MED ORDER — BACLOFEN 10 MG PO TABS: 5.0000 mg | ORAL_TABLET | Freq: Two times a day (BID) | ORAL | 0 refills | Status: DC | PRN

## 2023-08-03 MED ORDER — BACLOFEN 5 MG PO TABS: 5.0000 mg | ORAL_TABLET | Freq: Two times a day (BID) | ORAL | 0 refills | Status: DC | PRN

## 2023-08-03 NOTE — ED Provider Notes (Signed)
[facility]    CSN: [id number] Arrival date & time: 08/03/23  1115      History   Chief Complaint Chief Complaint  Patient presents with   Neck Pain    HPI Autumn Fitzgerald is a 77 y.o. female.   Patient presents today with a year plus long history of left neck pain.  She reports that this begins behind her ear and travels into her neck towards her shoulder.  Pain is rated 8 on a 0-10 pain scale, described as tightness/aching, worse at night when she tries to sleep, no alleviating factors identified.  She has tried Tylenol without improvement of symptoms.  Denies previous spinal surgery but does have a history of osteoarthritis and has had multiple arthroscopic procedures.  She denies any headache, fever, nausea, vomiting, visual disturbance, photophobia.  She does have a history of migraines and will occasionally get migraines but this is not increased from baseline.  She denies any recent illness or additional symptoms.  She does occasionally have some numbness in her left hand when she lays on her shoulder at night but denies any ongoing numbness or paresthesias.  She is right-handed.      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)    Hypothyroidism    followed by pcp   LBBB (left bundle branch block)    Mild persistent allergic asthma    Palpitations    Panic attacks    Pneumonia    PONV (postoperative nausea and vomiting)    Pre-diabetes    Wears glasses     Patient Active Problem List   Diagnosis Date Noted   Pain in joint of left knee 05/02/2022   Arthritis  of carpometacarpal (CMC) joint of left thumb 02/23/2022   Not well controlled moderate persistent asthma 12/16/2021   Chronic rhinitis 12/16/2021   Pain in joint of right shoulder 02/05/2019   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   Trochanteric bursitis 09/16/2018   Viral URI with cough 10/09/2017   Encounter for loop recorder at end of battery life 08/02/2016   New onset of headaches after age 33 09/30/2015   Dizziness 09/30/2015   Vision loss, bilateral 09/30/2015   Visual impairment severe bilaterally 09/30/2015   Lichen sclerosus et atrophicus 06/18/2014   Benign gastric polyp 02/20/2014   Degenerative joint disease involving multiple joints 09/10/2013   Hypertrophic obstructive cardiomyopathy (HCC) 07/23/2013   LBBB (left bundle branch block) 07/23/2013   Subclinical hypothyroidism 05/14/2012   Lower abdominal pain 01/10/2012   Panic attacks    LPRD (laryngopharyngeal reflux disease)    Mixed hyperlipidemia    Mild persistent asthma    HTN (hypertension)    Palpitations    Allergic rhinitis 02/24/2011   Anxiety, generalized 02/24/2011   Chronic recurrent major depressive disorder (HCC) 02/24/2011    Past Surgical History:  Procedure Laterality Date   CARDIAC SURGERY  02/05/2007   in [place]   Septal Myectomy  for HOCM   CATARACT EXTRACTION W/ INTRAOCULAR LENS  IMPLANT, BILATERAL  right 07-23-2017;  left 10-15-2017   COLONOSCOPY N/A 11/24/2013   Procedure: COLONOSCOPY;  Surgeon: Charna Elizabeth, MD;  Location: [facility];  Service: Endoscopy;  Laterality: N/A;   DOPPLER ECHOCARDIOGRAPHY  03/14/2013   EF 65-70%,trivial AI,mild-mod. MR,Mod. TR,mod. concentric hypertrophy   EP IMPLANTABLE DEVICE N/A 08/22/2016   Procedure: Loop Recorder Removal;  Surgeon: Thurmon Fair, MD;  Location: [facility];  Service: Cardiovascular;  Laterality: N/A;   ESOPHAGOGASTRODUODENOSCOPY N/A 11/24/2013   Procedure: ESOPHAGOGASTRODUODENOSCOPY (EGD);  Surgeon: Charna Elizabeth, MD;   Location: [facility];  Service: Endoscopy;  Laterality: N/A;   GLAUCOMA SURGERY Bilateral 2019   "release pressure   KNEE ARTHROSCOPY Right 10/17/2013   Procedure: ARTHROSCOPY RIGHT KNEE;  Surgeon: Verlee Rossetti, MD;  Location: [facility];  Service: Orthopedics;  Laterality: Right;   KNEE ARTHROSCOPY Left 11/08/2022   Procedure: ARTHROSCOPY KNEE; MEDIAL AND LATERAL MENISECTOMIES, CHONDROPLASTY;  Surgeon: Beverely Low, MD;  Location: [facility];  Service: Orthopedics;  Laterality: Left;  60 min   LAPAROSCOPIC CHOLECYSTECTOMY  02-03-2010   dr d blackman  @[facility]    LOOP RECORDER IMPLANT  03/18/2013   LOOP RECORDER IMPLANT N/A 03/18/2013   Procedure: LOOP RECORDER IMPLANT;  Surgeon: Thurmon Fair, MD;  Location: [facility];  Service: Cardiovascular;  Laterality: N/A;   SHOULDER ARTHROSCOPY WITH ROTATOR CUFF REPAIR Right 01/16/2019   Procedure: Right shoulder arthroscopy with subacromial decompression, distal clavicle resection, rotator cuff repair, bicep tenotomy;  Surgeon: Francena Hanly, MD;  Location: [facility];  Service: Orthopedics;  Laterality: Right;   TUBAL LIGATION  yrs ago    OB History     Gravida  5   Para  5   Term  5   Preterm      AB      Living  4      SAB      IAB      Ectopic      Multiple      Live Births               Home Medications    Prior to Admission medications   Medication Sig Start Date End Date Taking? Authorizing Provider  lidocaine (LIDODERM) 5 % Place 1 patch onto the skin daily. Remove & Discard patch within 12 hours or as directed by MD 08/03/23  Yes Yedidya Duddy, Denny Peon K, PA-C  albuterol (VENTOLIN HFA) 108 (90 Base) MCG/ACT inhaler INHALE 2 PUFFS INTO THE LUNGS EVERY 4 HOURS AS NEEDED FOR WHEEZING OR SHORTNESS OF BREATH 07/17/23   Kozlow, Alvira Philips, MD  atenolol (TENORMIN) 25 MG tablet Take 0.5 tablets (12.5 mg total) by mouth daily. 12/03/19   Croitoru, Mihai, MD  baclofen 5 MG TABS Take 1 tablet (5 mg total) by mouth 2 (two) times daily as needed for  muscle spasms. 08/03/23   Cristopher Ciccarelli, Noberto Retort, PA-C  Boswellia-Glucosamine-Vit D (OSTEO BI-FLEX ONE PER DAY PO) Take 1 tablet by mouth.    [provider]  carboxymethylcellul-glycerin (REFRESH RELIEVA) 0.5-0.9 % ophthalmic solution Place 1 drop into both eyes daily. 02/27/23   Kozlow, Alvira Philips, MD  Cholecalciferol 50 MCG (2000 UT) TABS Take 2,000 Units by mouth daily.    [provider]  clobetasol ointment (TEMOVATE) 0.05 % Apply to affected area every night for 4 weeks, then every other day for 4 weeks and then twice a week for 4 weeks or until resolution. Patient taking differently: Apply 1 Application topically 3 (three) times a week. 10/05/22  Constant, Peggy, MD  clonazePAM (KLONOPIN) 2 MG tablet Take 2 mg by mouth daily as needed. 11/15/22   [provider]  ezetimibe-simvastatin (VYTORIN) 10-40 MG per tablet Take 1 tablet by mouth every evening.     [provider]  FLOVENT HFA 44 MCG/ACT inhaler Inhale 2 puffs into the lungs daily. INHALE 2 PUFFS INTO THE LUNGS DAILY WITH SPACER. 07/18/22   Kozlow, Alvira Philips, MD  fluticasone (FLONASE) 50 MCG/ACT nasal spray Place 1 spray into both nostrils daily. 02/27/23   Kozlow, Alvira Philips, MD  Fluticasone Furoate (ARNUITY ELLIPTA) 100 MCG/ACT AEPB Inhale 1 puff into the lungs daily. 02/27/23   Kozlow, Alvira Philips, MD  levothyroxine (SYNTHROID) 50 MCG tablet Take 50 mcg by mouth daily.  06/15/14   [provider]  Multiple Vitamin (MULTIVITAMIN) tablet Take 1 tablet by mouth daily.    [provider]  pantoprazole (PROTONIX) 40 MG tablet Take 1 tablet (40 mg total) by mouth in the morning. 02/27/23   Kozlow, Alvira Philips, MD  Polyethyl Glycol-Propyl Glycol 0.4-0.3 % SOLN Place one drop in both eyes daily as needed 01/04/21   Kozlow, Alvira Philips, MD  Propylene Glycol (SYSTANE COMPLETE) 0.6 % SOLN Apply 1 drop to eye 3 (three) times daily as needed. 02/27/23   Kozlow, Alvira Philips, MD  torsemide (DEMADEX) 5 MG tablet Take 5 mg by mouth daily.     [provider]    Family History Family History  Problem Relation Age of Onset   Cancer Mother        breast   Hypertension Mother    Breast cancer Mother    Cancer Father        prostate   Cancer Brother    Diabetes Sister    Heart attack Sister    Migraines Neg Hx     Social History Social History   Tobacco Use   Smoking status: Never   Smokeless tobacco: Never  Vaping Use   Vaping status: Never Used  Substance Use Topics   Alcohol use: No   Drug use: No     Allergies   Amoxicillin-pot clavulanate, Temazepam, Lipitor [atorvastatin calcium], Amoxicillin, Lansoprazole, and Penicillin g   Review of Systems Review of Systems  Constitutional:  Positive for activity change. Negative for appetite change, fatigue and fever.  Respiratory:  Negative for cough and shortness of breath.   Cardiovascular:  Negative for chest pain.  Gastrointestinal:  Negative for abdominal pain, diarrhea, nausea and vomiting.  Musculoskeletal:  Positive for neck pain. Negative for arthralgias and myalgias.  Neurological:  Negative for dizziness, light-headedness and headaches.     Physical Exam Triage Vital Signs ED Triage Vitals  Encounter Vitals Group     BP 08/03/23 1254 (!) 134/90     Systolic BP Percentile --      Diastolic BP Percentile --      Pulse Rate 08/03/23 1254 75     Resp 08/03/23 1254 18     Temp --      Temp src --      SpO2 08/03/23 1254 100 %     Weight 08/03/23 1253 131 lb (59.4 kg)     Height --      Head Circumference --      Peak Flow --      Pain Score 08/03/23 1253 8     Pain Loc --      Pain Education --      Exclude from Growth Chart --  No data found.  Updated Vital Signs BP (!) 134/90 (BP Location: Right Arm)   Pulse 75   Resp 18   Wt 131 lb (59.4 kg)   SpO2 100%   BMI 25.06 kg/m   Visual Acuity Right Eye Distance:   Left Eye Distance:   Bilateral Distance:    Right Eye Near:   Left Eye Near:    Bilateral Near:      Physical Exam Vitals reviewed.  Constitutional:      General: She is awake. She is not in acute distress.    Appearance: Normal appearance. She is well-developed. She is not ill-appearing.     Comments: Very pleasant female appears stated age in no acute distress sitting comfortably in exam room  HENT:     Head: Normocephalic and atraumatic.     Right Ear: Tympanic membrane, ear canal and external ear normal. Tympanic membrane is not erythematous or bulging.     Left Ear: Tympanic membrane, ear canal and external ear normal. Tympanic membrane is not erythematous or bulging.     Nose: Nose normal.     Mouth/Throat:     Pharynx: Uvula midline. No oropharyngeal exudate or posterior oropharyngeal erythema.  Eyes:     Extraocular Movements: Extraocular movements intact.     Conjunctiva/sclera: Conjunctivae normal.     Pupils: Pupils are equal, round, and reactive to light.  Cardiovascular:     Rate and Rhythm: Normal rate and regular rhythm.     Heart sounds: Normal heart sounds, S1 normal and S2 normal. No murmur heard. Pulmonary:     Effort: Pulmonary effort is normal.     Breath sounds: Normal breath sounds. No wheezing, rhonchi or rales.     Comments: Clear to auscultation bilaterally Musculoskeletal:     Cervical back: Normal range of motion and neck supple. Tenderness present. No bony tenderness. Pain with movement and spinous process tenderness present. No muscular tenderness.     Comments: Tenderness over insertion of SCM and along belly of this muscle.  Mild tenderness palpation over trapezius.  No deformity or step-off noted.  No pain percussion of vertebrae.  Normal active range of motion of neck.  Psychiatric:        Behavior: Behavior is cooperative.      UC Treatments / Results  Labs (all labs ordered are listed, but only abnormal results are displayed) Labs Reviewed - No data to display  EKG   Radiology DG Cervical Spine Complete  Result Date:  08/03/2023 CLINICAL DATA:  Pain for 1 year. EXAM: CERVICAL SPINE - COMPLETE 4+ VIEW COMPARISON:  None Available. FINDINGS: Straightening of normal lordosis. Mild broad-based levo scoliotic curvature. Trace retrolisthesis of C3 on C4 and C4 on C5. Disc space narrowing and spurring from C3-C4 through C6-C7. Multilevel facet hypertrophy. No radiographic evidence of fracture or focal bone abnormality. No prevertebral soft tissue thickening. IMPRESSION: 1. Mild broad-based levo scoliotic curvature. 2. Moderate multilevel degenerative disc disease. Mild facet hypertrophy. 3. Trace degenerative retrolisthesis of C3 on C4 and C4 on C5. Electronically Signed   By: Narda Rutherford M.D.   On: 08/03/2023 15:11    Procedures Procedures (including critical care time)  Medications Ordered in UC Medications - No data to display  Initial Impression / Assessment and Plan / UC Course  I have reviewed the triage vital signs and the nursing notes.  Pertinent labs & imaging results that were available during my care of the patient were reviewed by me and considered in  my medical decision making (see chart for details).     Patient is well-appearing, afebrile, nontoxic, nontachycardic.  X-ray was obtained given bony tenderness that showed degenerative changes without acute osseous abnormality.  We discussed that this is the likely cause of her symptoms contributing to associated musculoskeletal pain.  She is limited in the medications that can be used given her past medical history so was prescribed baclofen 5 mg up to twice a day as needed.  We discussed this can be sedating and she is not to drive or drink alcohol with taking it.  She should space away from her Klonopin due to associated sedation.  She can use Tylenol for breakthrough pain.  Lidocaine patches were sent to pharmacy.  Encouraged her to use heat, rest, stretch for additional symptom relief.  She would likely benefit from physical therapy and potentially  other interventions such as dry needling and so encouraged her to follow-up with orthopedics to arrange additional therapies.  She is already established with [facility] and will call them to schedule an appointment for additional evaluation.  We discussed that if she has any worsening or changing symptoms she needs to be seen immediately.  Strict return precautions given.  Excuse note provided.  Final Clinical Impressions(s) / UC Diagnoses   Final diagnoses:  Neck muscle spasm  DDD (degenerative disc disease), cervical     Discharge Instructions      Your x-ray showed arthritis throughout your spine.  I believe this is contributing to your symptoms.  Take baclofen up to twice a day.  This will make you sleepy so do not drive or drink alcohol while taking it.  Apply lidocaine patch during the day and then remove this at night.  Use only 1 patch per 24 hours.  I do recommend that you follow-up with orthopedics as you may benefit from physical therapy or more advanced imaging.  Call them to schedule an appointment.  You can use Tylenol for breakthrough pain.  If anything worsens and you have increasing pain, severe headache, fever, nausea, vomiting you need to be seen immediately.     ED Prescriptions     Medication Sig Dispense Auth. Provider   baclofen (LIORESAL) 10 MG tablet  (Status: Discontinued) Take 0.5 tablets (5 mg total) by mouth 2 (two) times daily as needed for muscle spasms. 30 each Meline Russaw K, PA-C   lidocaine (LIDODERM) 5 % Place 1 patch onto the skin daily. Remove & Discard patch within 12 hours or as directed by MD 30 patch Kaiden Dardis K, PA-C   baclofen 5 MG TABS Take 1 tablet (5 mg total) by mouth 2 (two) times daily as needed for muscle spasms. 14 tablet Dvaughn Fickle, Noberto Retort, PA-C      PDMP not reviewed this encounter.   Jeani Hawking, PA-C 08/03/23 1529

## 2023-08-03 NOTE — ED Triage Notes (Signed)
Left side neck pain x 3 months.

## 2023-08-03 NOTE — Discharge Instructions (Addendum)
Your x-ray showed arthritis throughout your spine.  I believe this is contributing to your symptoms.  Take baclofen up to twice a day.  This will make you sleepy so do not drive or drink alcohol while taking it.  Apply lidocaine patch during the day and then remove this at night.  Use only 1 patch per 24 hours.  I do recommend that you follow-up with orthopedics as you may benefit from physical therapy or more advanced imaging.  Call them to schedule an appointment.  You can use Tylenol for breakthrough pain.  If anything worsens and you have increasing pain, severe headache, fever, nausea, vomiting you need to be seen immediately.

## 2023-08-28 ENCOUNTER — Telehealth: Payer: Self-pay | Admitting: Gastroenterology

## 2023-08-28 ENCOUNTER — Ambulatory Visit (INDEPENDENT_AMBULATORY_CARE_PROVIDER_SITE_OTHER): Payer: Medicare HMO | Admitting: Allergy and Immunology

## 2023-08-28 ENCOUNTER — Other Ambulatory Visit: Payer: Self-pay

## 2023-08-28 ENCOUNTER — Encounter: Payer: Self-pay | Admitting: Allergy and Immunology

## 2023-08-28 VITALS — BP 128/72 | HR 77 | Temp 98.0°F | Resp 18

## 2023-08-28 DIAGNOSIS — R1084 Generalized abdominal pain: Secondary | ICD-10-CM | POA: Diagnosis not present

## 2023-08-28 DIAGNOSIS — J4540 Moderate persistent asthma, uncomplicated: Secondary | ICD-10-CM

## 2023-08-28 DIAGNOSIS — K219 Gastro-esophageal reflux disease without esophagitis: Secondary | ICD-10-CM

## 2023-08-28 DIAGNOSIS — J3089 Other allergic rhinitis: Secondary | ICD-10-CM

## 2023-08-28 MED ORDER — PANTOPRAZOLE SODIUM 40 MG PO TBEC: 40.0000 mg | DELAYED_RELEASE_TABLET | Freq: Every morning | ORAL | 1 refills | Status: DC

## 2023-08-28 MED ORDER — ARNUITY ELLIPTA 100 MCG/ACT IN AEPB: 1.0000 | INHALATION_SPRAY | Freq: Every day | RESPIRATORY_TRACT | 5 refills | Status: DC

## 2023-08-28 MED ORDER — FLUTICASONE PROPIONATE 50 MCG/ACT NA SUSP: 1.0000 | Freq: Every day | NASAL | 1 refills | Status: DC

## 2023-08-28 NOTE — Telephone Encounter (Signed)
Inbound call from patient requesting to transfer her care. Patient wishing to be seen for colonoscopy, abdominal pain, and being unable to eat. Patient has history with [facility]. Patient unsure when last colonoscopy was. Patient wishing to transfer her care due to patient having to be seen in [facility] and her son use to take her. Patient's son has now moved to [place] and patient does not have transportation. Patient will have records sent over for review.

## 2023-08-28 NOTE — Patient Instructions (Addendum)
  1.  Continue Arnuity 100 - 1 inhalation 1-7 times per week  2.  Continue Flonase - 1 spray each nostril 1-7 times per week  3.  Continue pantoprazole 40 mg in a.m.    4.  Can use nasal saline and systane/ refresh eye drops if needed.  5.  Can use albuterol HFA 2 puffs every 4-6 hours if needed.  6. Can increase Arnuity and Flonase to 2 times per day during "Flare up"  7. Return to clinic in 6 months or earlier if problem  8. Obtain fall flu vaccine  9. Visit with [facility] about stomach issue

## 2023-08-28 NOTE — Progress Notes (Unsigned)
[place] [facility] [place] [facility] [place] [facility] [place] -    Follow-up Note  Referring Provider: [name], MD Primary Provider: [name], MD Date of Office Visit: 08/28/2023  Subjective:   Autumn Fitzgerald (DOB: 1946/07/15) is a 77 y.o. female who [facility] in re-evaluation of the following:  HPI: Autumn Fitzgerald returns to this clinic in evaluation of asthma, allergic rhinitis, LPR.  I last saw her in this  Overall she feels as though her asthma and allergic rhinitis is under very good control while using Arnuity and Flonase just a few times per week.  Rarely does she use any short acting bronchodilator and she has not required a systemic steroid or an antibiotic for any type of airway issue.  She does not have any classic reflux symptoms and her throat is doing well while using pantoprazole.  She has been having postprandial pain and apparently this was a problem in the past secondary to "stomach polyps" which have been removed on 2 occasions by Dr. Loreta Ave.  However, it does not appear as though Dr. Loreta Ave is providing any current care regarding this issue and Autumn Fitzgerald has contacted [facility] to see if she can get in for evaluation and treatment this morning.  She is also had plantars fasciitis of her left foot and she is also had some left-sided neck pain that is undergoing evaluation at this point.  It sounds as though she has a appointment to see a orthopedic surgeon.  She has tried some exercises for her plantar fasciitis which has not helped at all.  Allergies as of 08/28/2023       Reactions   Amoxicillin-pot Clavulanate Other (See Comments)   Severe pains and GI symptoms Did it involve swelling of the face/tongue/throat, SOB, or low BP? No Did it involve sudden or severe rash/hives, skin peeling, or any reaction on the inside of your mouth or nose? No Did you need to seek medical attention at a hospital or doctor's office? No When did it  last happen?      within the past 10 years If all above answers are "NO", may proceed with cephalosporin use.   Temazepam Other (See Comments)   Dizziness and doesn't work for patient   Lipitor [atorvastatin Calcium]    Muscle pain   Amoxicillin Nausea And Vomiting   Lansoprazole Palpitations   "Body felt sick"    Penicillin G Other (See Comments)   Unknown reaction         Medication List    albuterol 108 (90 Base) MCG/ACT inhaler Commonly known as: VENTOLIN HFA INHALE 2 PUFFS INTO THE LUNGS EVERY 4 HOURS AS NEEDED FOR WHEEZING OR SHORTNESS OF BREATH   Arnuity Ellipta 100 MCG/ACT Aepb Generic drug: Fluticasone Furoate Inhale 1 puff into the lungs daily.   atenolol 25 MG tablet Commonly known as: TENORMIN Take 0.5 tablets (12.5 mg total) by mouth daily.   Baclofen 5 MG Tabs Take 1 tablet (5 mg total) by mouth 2 (two) times daily as needed for muscle spasms.   Cholecalciferol 50 MCG (2000 UT) Tabs Take 2,000 Units by mouth daily.   clobetasol ointment 0.05 % Commonly known as: TEMOVATE Apply to affected area every night for 4 weeks, then every other day for 4 weeks and then twice a week for 4 weeks or until resolution.   clonazePAM 2 MG tablet Commonly known as: KLONOPIN Take 2 mg by mouth daily as needed.   ezetimibe-simvastatin  10-40 MG tablet Commonly known as: VYTORIN Take 1 tablet by mouth every evening.   Flovent HFA 44 MCG/ACT inhaler Generic drug: fluticasone Inhale 2 puffs into the lungs daily. INHALE 2 PUFFS INTO THE LUNGS DAILY WITH SPACER.   fluticasone 50 MCG/ACT nasal spray Commonly known as: Flonase Place 1 spray into both nostrils daily.   levothyroxine 50 MCG tablet Commonly known as: SYNTHROID Take 50 mcg by mouth daily.   lidocaine 5 % Commonly known as: Lidoderm Place 1 patch onto the skin daily. Remove & Discard patch within 12 hours or as directed by MD   multivitamin tablet Take 1 tablet by mouth daily.   OSTEO BI-FLEX ONE PER  DAY PO Take 1 tablet by mouth.   pantoprazole 40 MG tablet Commonly known as: PROTONIX Take 1 tablet (40 mg total) by mouth in the morning.   Polyethyl Glycol-Propyl Glycol 0.4-0.3 % Soln Place one drop in both eyes daily as needed   Refresh Relieva 0.5-0.9 % ophthalmic solution Generic drug: carboxymethylcellul-glycerin Place 1 drop into both eyes daily.   Systane Complete 0.6 % Soln Generic drug: Propylene Glycol Apply 1 drop to eye 3 (three) times daily as needed.   torsemide 5 MG tablet Commonly known as: DEMADEX Take 5 mg by mouth daily.    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)    Hypothyroidism    followed by pcp   LBBB (left bundle branch block)    Mild persistent allergic asthma    Palpitations    Panic attacks    Pneumonia    PONV (postoperative nausea and vomiting)    Pre-diabetes    Wears glasses     Past Surgical History:  Procedure Laterality Date   CARDIAC SURGERY  02/05/2007   in [place]   Septal Myectomy  for HOCM   CATARACT EXTRACTION W/ INTRAOCULAR LENS  IMPLANT, BILATERAL  right 07-23-2017;  left 10-15-2017   COLONOSCOPY N/A 11/24/2013   Procedure: COLONOSCOPY;  Surgeon: Charna Elizabeth, MD;  Location: [facility];  Service: Endoscopy;  Laterality: N/A;   DOPPLER ECHOCARDIOGRAPHY  03/14/2013   EF 65-70%,trivial AI,mild-mod. MR,Mod. TR,mod. concentric hypertrophy   EP IMPLANTABLE DEVICE N/A 08/22/2016   Procedure: Loop Recorder Removal;  Surgeon: Thurmon Fair, MD;  Location: [facility];  Service: Cardiovascular;  Laterality: N/A;    ESOPHAGOGASTRODUODENOSCOPY N/A 11/24/2013   Procedure: ESOPHAGOGASTRODUODENOSCOPY (EGD);  Surgeon: Charna Elizabeth, MD;  Location: [facility];  Service: Endoscopy;  Laterality: N/A;   GLAUCOMA SURGERY Bilateral 2019   "release pressure   KNEE ARTHROSCOPY Right 10/17/2013   Procedure: ARTHROSCOPY RIGHT KNEE;  Surgeon: Verlee Rossetti, MD;  Location: [facility] OR;  Service: Orthopedics;  Laterality: Right;   KNEE ARTHROSCOPY Left 11/08/2022   Procedure: ARTHROSCOPY KNEE; MEDIAL AND LATERAL MENISECTOMIES, CHONDROPLASTY;  Surgeon: Beverely Low, MD;  Location: [facility] ORS;  Service: Orthopedics;  Laterality: Left;  60 min   LAPAROSCOPIC CHOLECYSTECTOMY  02-03-2010   dr d blackman  @[facility]    LOOP RECORDER IMPLANT  03/18/2013  LOOP RECORDER IMPLANT N/A 03/18/2013   Procedure: LOOP RECORDER IMPLANT;  Surgeon: Thurmon Fair, MD;  Location: [facility] CATH LAB;  Service: Cardiovascular;  Laterality: N/A;   SHOULDER ARTHROSCOPY WITH ROTATOR CUFF REPAIR Right 01/16/2019   Procedure: Right shoulder arthroscopy with subacromial decompression, distal clavicle resection, rotator cuff repair, bicep tenotomy;  Surgeon: Francena Hanly, MD;  Location: [facility];  Service: Orthopedics;  Laterality: Right;   TUBAL LIGATION  yrs ago    Review of systems negative except as noted in HPI / PMHx or noted below:  Review of Systems  Constitutional: Negative.   HENT: Negative.    Eyes: Negative.   Respiratory: Negative.    Cardiovascular: Negative.   Gastrointestinal: Negative.   Genitourinary: Negative.   Musculoskeletal: Negative.   Skin: Negative.   Neurological: Negative.   Endo/Heme/Allergies: Negative.   Psychiatric/Behavioral: Negative.       Objective:   Vitals:   08/28/23 1327  BP: 128/72  Pulse: 77  Resp: 18  Temp: 98 F (36.7 C)  SpO2: 97%          Physical Exam Constitutional:      Appearance: She is not diaphoretic.  HENT:     Head: Normocephalic.     Right Ear: Tympanic membrane, ear canal and external ear  normal.     Left Ear: Tympanic membrane, ear canal and external ear normal.     Nose: Nose normal. No mucosal edema or rhinorrhea.     Mouth/Throat:     Pharynx: Uvula midline. No oropharyngeal exudate.  Eyes:     Conjunctiva/sclera: Conjunctivae normal.  Neck:     Thyroid: No thyromegaly.     Trachea: Trachea normal. No tracheal tenderness or tracheal deviation.  Cardiovascular:     Rate and Rhythm: Normal rate and regular rhythm.     Heart sounds: Normal heart sounds, S1 normal and S2 normal. No murmur heard. Pulmonary:     Effort: No respiratory distress.     Breath sounds: Normal breath sounds. No stridor. No wheezing or rales.  Lymphadenopathy:     Head:     Right side of head: No tonsillar adenopathy.     Left side of head: No tonsillar adenopathy.     Cervical: No cervical adenopathy.  Skin:    Findings: No erythema or rash.     Nails: There is no clubbing.  Neurological:     Mental Status: She is alert.     Diagnostics:    Spirometry was performed and demonstrated an FEV1 of 1.30 at 81 % of predicted.  The patient had an Asthma Control Test with the following results: ACT Total Score: 25.    Assessment and Plan:   1. Asthma, moderate persistent, well-controlled   2. Other allergic rhinitis   3. LPRD (laryngopharyngeal reflux disease)   4. Generalized postprandial abdominal pain     1.  Continue Arnuity 100 - 1 inhalation 1-7 times per week  2.  Continue Flonase - 1 spray each nostril 1-7 times per week  3.  Continue pantoprazole 40 mg in a.m.    4.  Can use nasal saline and systane/ refresh eye drops if needed.  5.  Can use albuterol HFA 2 puffs every 4-6 hours if needed.  6. Can increase Arnuity and Flonase to 2 times per day during "Flare up"  7. Return to clinic in 6 months or earlier if problem  8. Obtain fall flu vaccine  9. Visit with [facility] about stomach issue  Shy appears to be doing pretty well regarding her respiratory tract issue and  her reflux induced respiratory disease on her current plan as noted above and she will remain on some dose of Arnuity and some dose of Flonase and consistent dose of pantoprazole.  She varies her dose of Arnuity and Flonase depending on how she is doing and currently while using these agents about twice a week she feels as though she is doing very well.  She has some kind of postprandial pain that needs to be evaluated and we will see what happens regarding her attempt to have [facility] address this issue.  Apparently they are waiting for records from Dr. Loreta Ave before proceeding with any further evaluation or treatment.  I will see her back in this clinic in 6 months or earlier if there is a problem.   Laurette Schimke, MD Allergy / Immunology [facility]

## 2023-08-29 ENCOUNTER — Encounter: Payer: Self-pay | Admitting: Allergy and Immunology

## 2023-08-29 ENCOUNTER — Telehealth: Payer: Self-pay

## 2023-08-29 NOTE — Telephone Encounter (Signed)
-----   Message from [facility] sent at 08/28/2023  4:33 PM EDT -----  ----- Message ----- From: Jessica Priest, MD Sent: 08/28/2023   1:59 PM EDT To: [facility]  Please refer [name] to [facility] for post prandial abdominal pain and gastric polyps. Please give her a call today and let her know we are making this referral.

## 2023-08-29 NOTE — Telephone Encounter (Signed)
Patient contacted [facility] on 08/28/23  per telephone contact and their office has requested her to get records from her previous GI provider in [facility].   Referral has been placed in system to [facility].   MyChart message sent to patient with details.

## 2023-08-31 ENCOUNTER — Other Ambulatory Visit: Payer: Self-pay

## 2023-08-31 ENCOUNTER — Emergency Department (HOSPITAL_BASED_OUTPATIENT_CLINIC_OR_DEPARTMENT_OTHER): Payer: Medicare HMO

## 2023-08-31 ENCOUNTER — Emergency Department (HOSPITAL_BASED_OUTPATIENT_CLINIC_OR_DEPARTMENT_OTHER)
Admission: EM | Admit: 2023-08-31 | Discharge: 2023-08-31 | Disposition: A | Discharge status: Home / Self Care | Payer: Medicare HMO | Attending: Emergency Medicine | Admitting: Emergency Medicine

## 2023-08-31 DIAGNOSIS — R140 Abdominal distension (gaseous): Secondary | ICD-10-CM | POA: Diagnosis not present

## 2023-08-31 DIAGNOSIS — R1013 Epigastric pain: Secondary | ICD-10-CM | POA: Insufficient documentation

## 2023-08-31 LAB — COMPREHENSIVE METABOLIC PANEL
ALT: 21 U/L (ref 0–44)
AST: 24 U/L (ref 15–41)
Albumin: 4.3 g/dL (ref 3.5–5.0)
Alkaline Phosphatase: 85 U/L (ref 38–126)
Anion gap: 10 (ref 5–15)
BUN: 10 mg/dL (ref 8–23)
CO2: 27 mmol/L (ref 22–32)
Calcium: 9.2 mg/dL (ref 8.9–10.3)
Chloride: 104 mmol/L (ref 98–111)
Creatinine, Ser: 0.84 mg/dL (ref 0.44–1.00)
GFR, Estimated: >60 mL/min (ref >60)
Glucose, Bld: 95 mg/dL (ref 70–99)
Potassium: 3.7 mmol/L (ref 3.5–5.1)
Sodium: 141 mmol/L (ref 135–145)
Total Bilirubin: 1.5 mg/dL — ABNORMAL HIGH (ref 0.3–1.2)
Total Protein: 7.2 g/dL (ref 6.5–8.1)

## 2023-08-31 LAB — URINALYSIS, ROUTINE W REFLEX MICROSCOPIC
Bacteria, UA: NONE SEEN
Bilirubin Urine: NEGATIVE
Glucose, UA: NEGATIVE mg/dL
Ketones, ur: NEGATIVE mg/dL
Nitrite: NEGATIVE
Protein, ur: NEGATIVE mg/dL
Specific Gravity, Urine: 1.013 (ref 1.005–1.030)
pH: 6 (ref 5.0–8.0)

## 2023-08-31 LAB — CBC
HCT: 42.3 % (ref 36.0–46.0)
Hemoglobin: 14 g/dL (ref 12.0–15.0)
MCH: 28.4 pg (ref 26.0–34.0)
MCHC: 33.1 g/dL (ref 30.0–36.0)
MCV: 85.8 fL (ref 80.0–100.0)
Platelets: 256 10*3/uL (ref 150–400)
RBC: 4.93 MIL/uL (ref 3.87–5.11)
RDW: 13.4 % (ref 11.5–15.5)
WBC: 5.2 10*3/uL (ref 4.0–10.5)
nRBC: 0 % (ref 0.0–0.2)

## 2023-08-31 LAB — LIPASE, BLOOD: Lipase: 16 U/L (ref 11–51)

## 2023-08-31 MED ORDER — DICYCLOMINE HCL 20 MG PO TABS
20.0000 mg | ORAL_TABLET | Freq: Two times a day (BID) | ORAL | 0 refills | Status: DC | PRN
Start: 2023-08-31 — End: 2023-09-18

## 2023-08-31 MED ORDER — SUCRALFATE 1 G PO TABS
1.0000 g | ORAL_TABLET | Freq: Three times a day (TID) | ORAL | 0 refills | Status: DC
Start: 2023-08-31 — End: 2023-09-18

## 2023-08-31 MED ORDER — IOHEXOL 300 MG/ML  SOLN
100.0000 mL | Freq: Once | INTRAMUSCULAR | Status: AC | PRN
  Administered 2023-08-31: 100 mL via INTRAVENOUS

## 2023-08-31 NOTE — ED Triage Notes (Signed)
Pt reports generalized abdominal pain for 2 months and reports she cannot eat anything without the pain worsening.  AAOx4 in triage, NAD.

## 2023-08-31 NOTE — ED Notes (Signed)
Patient returned from CT

## 2023-08-31 NOTE — ED Notes (Signed)
Patient transported to CT 

## 2023-08-31 NOTE — ED Provider Notes (Signed)
[place] EMERGENCY DEPARTMENT AT [facility] Provider Note   CSN: [id number] Arrival date & time: 08/31/23  7829     History  Chief Complaint  Patient presents with   Abdominal Pain    Autumn Fitzgerald is a 77 y.o. female presenting to the ED with complaint of abdominal pain.  The patient reports she has had chronic abdominal and epigastric pain for many years.  She has seen originally a gastroenterologist here who was Dr. Loreta Ave, but then was referred to [facility], now is attempting to find a more local doctor because she is not able to get transportation to [facility].  Unfortunately she has had difficulty obtaining the paperwork necessary to reestablish care with a local GI doctor.  She is here requesting assistance with getting medical records.  She also reports that she feels that her abdomen is bloated.  She said last night she had a meal of chicken and rice and vegetables and had significant pain and bloating afterwards.  She did move her bowels earlier today.  She did reports a history of a cholecystectomy.  She also reports that she had "a polyp removed" from her stomach during her last colonoscopy and endoscopy.  Sigmoid polyp removed 07/2018 per external records.  Jan 2019 record reports normal emptying study in 2011, normal EGD in 2015 and a normal abdominal CT in 2017.  She has been trialed on probiotics, PPIs, IBgard, FD guard without relief.  In 2018 she had hydrogen breath test rule out SIBO.  In 2018 she also had an endoscopy performed there were pyloric stenosis was noted and treated with balloon dilatation.    EGD 11/05/17: 1. The mucosa of the esophagus appeared normal 2. Stenosis was found at the pylorus; Using a TTS-balloon the stricture was dilated up to 14mm 3. Multiple sessile polyps ranging between 3-70mm in size were found in the gastric fundus; multiple biopsies was performed 4. Duodenal inflammation was found in the duodenal bulb 5. Retroflexion was  performed in the stomach and revealed no abnormalities   HPI     Home Medications Prior to Admission medications   Medication Sig Start Date End Date Taking? Authorizing Provider  dicyclomine (BENTYL) 20 MG tablet Take 1 tablet (20 mg total) by mouth 2 (two) times daily as needed for up to 30 doses for spasms. For cramping abdominal pain 08/31/23  Yes Eldana Isip, Kermit Balo, MD  sucralfate (CARAFATE) 1 g tablet Take 1 tablet (1 g total) by mouth 4 (four) times daily -  with meals and at bedtime for 60 doses. 08/31/23 09/15/23 Yes Terald Sleeper, MD  albuterol (VENTOLIN HFA) 108 (90 Base) MCG/ACT inhaler INHALE 2 PUFFS INTO THE LUNGS EVERY 4 HOURS AS NEEDED FOR WHEEZING OR SHORTNESS OF BREATH 07/17/23   Kozlow, Alvira Philips, MD  atenolol (TENORMIN) 25 MG tablet Take 0.5 tablets (12.5 mg total) by mouth daily. 12/03/19   Croitoru, Mihai, MD  baclofen 5 MG TABS Take 1 tablet (5 mg total) by mouth 2 (two) times daily as needed for muscle spasms. 08/03/23   Raspet, Noberto Retort, PA-C  Boswellia-Glucosamine-Vit D (OSTEO BI-FLEX ONE PER DAY PO) Take 1 tablet by mouth.    [provider]  carboxymethylcellul-glycerin (REFRESH RELIEVA) 0.5-0.9 % ophthalmic solution Place 1 drop into both eyes daily. 02/27/23   Kozlow, Alvira Philips, MD  Cholecalciferol 50 MCG (2000 UT) TABS Take 2,000 Units by mouth daily.    [provider]  clobetasol ointment (TEMOVATE) 0.05 % Apply to  affected area every night for 4 weeks, then every other day for 4 weeks and then twice a week for 4 weeks or until resolution. Patient taking differently: Apply 1 Application topically 3 (three) times a week. 10/05/22   Constant, Peggy, MD  clonazePAM (KLONOPIN) 2 MG tablet Take 2 mg by mouth daily as needed. 11/15/22   [provider]  ezetimibe-simvastatin (VYTORIN) 10-40 MG per tablet Take 1 tablet by mouth every evening.     [provider]  FLOVENT HFA 44 MCG/ACT inhaler Inhale 2 puffs into the lungs daily. INHALE 2 PUFFS  INTO THE LUNGS DAILY WITH SPACER. 07/18/22   Kozlow, Alvira Philips, MD  fluticasone (FLONASE) 50 MCG/ACT nasal spray Place 1 spray into both nostrils daily. 08/28/23   Kozlow, Alvira Philips, MD  Fluticasone Furoate (ARNUITY ELLIPTA) 100 MCG/ACT AEPB Inhale 1 puff into the lungs daily. 08/28/23   Kozlow, Alvira Philips, MD  levothyroxine (SYNTHROID) 50 MCG tablet Take 50 mcg by mouth daily.  06/15/14   [provider]  lidocaine (LIDODERM) 5 % Place 1 patch onto the skin daily. Remove & Discard patch within 12 hours or as directed by MD 08/03/23   Raspet, Noberto Retort, PA-C  Multiple Vitamin (MULTIVITAMIN) tablet Take 1 tablet by mouth daily.    [provider]  pantoprazole (PROTONIX) 40 MG tablet Take 1 tablet (40 mg total) by mouth in the morning. 08/28/23   Kozlow, Alvira Philips, MD  Polyethyl Glycol-Propyl Glycol 0.4-0.3 % SOLN Place one drop in both eyes daily as needed 01/04/21   Kozlow, Alvira Philips, MD  Propylene Glycol (SYSTANE COMPLETE) 0.6 % SOLN Apply 1 drop to eye 3 (three) times daily as needed. 02/27/23   Kozlow, Alvira Philips, MD  torsemide (DEMADEX) 5 MG tablet Take 5 mg by mouth daily.    [provider]      Allergies    Amoxicillin-pot clavulanate, Temazepam, Lipitor [atorvastatin calcium], Amoxicillin, Lansoprazole, and Penicillin g    Review of Systems   Review of Systems  Physical Exam Updated Vital Signs BP (!) 151/73 (BP Location: Right Arm)   Pulse 79   Temp 98.2 F (36.8 C)   Resp 17   SpO2 99%  Physical Exam Constitutional:      General: She is not in acute distress. HENT:     Head: Normocephalic and atraumatic.  Eyes:     Conjunctiva/sclera: Conjunctivae normal.     Pupils: Pupils are equal, round, and reactive to light.  Cardiovascular:     Rate and Rhythm: Normal rate and regular rhythm.  Pulmonary:     Effort: Pulmonary effort is normal. No respiratory distress.  Abdominal:     General: There is distension.     Tenderness: There is abdominal tenderness in the epigastric area.   Skin:    General: Skin is warm and dry.  Neurological:     General: No focal deficit present.     Mental Status: She is alert. Mental status is at baseline.  Psychiatric:        Mood and Affect: Mood normal.        Behavior: Behavior normal.     ED Results / Procedures / Treatments   Labs (all labs ordered are listed, but only abnormal results are displayed) Labs Reviewed  COMPREHENSIVE METABOLIC PANEL - Abnormal; Notable for the following components:      Result Value   Total Bilirubin 1.5 (*)    All other components within normal limits  URINALYSIS, ROUTINE W  REFLEX MICROSCOPIC - Abnormal; Notable for the following components:   Hgb urine dipstick TRACE (*)    Leukocytes,Ua SMALL (*)    All other components within normal limits  LIPASE, BLOOD  CBC    EKG None  Radiology CT ABDOMEN PELVIS W CONTRAST  Result Date: 08/31/2023 CLINICAL DATA:  Abdominal pain for 2 months which is getting worse EXAM: CT ABDOMEN AND PELVIS WITH CONTRAST TECHNIQUE: Multidetector CT imaging of the abdomen and pelvis was performed using the standard protocol following bolus administration of intravenous contrast. RADIATION DOSE REDUCTION: This exam was performed according to the departmental dose-optimization program which includes automated exposure control, adjustment of the mA and/or kV according to patient size and/or use of iterative reconstruction technique. CONTRAST:  OMNIPAQUE IOHEXOL 300 MG/ML  SOLN COMPARISON:  12/06/2016 FINDINGS: Lower chest: There is some linear opacity along the lung bases likely scar or atelectasis. No pleural effusion. Status post median sternotomy. Hepatobiliary: Fatty liver infiltration. Previous cholecystectomy. Patent portal vein. Pancreas: Unremarkable. No pancreatic ductal dilatation or surrounding inflammatory changes. Spleen: Normal in size without focal abnormality. Adrenals/Urinary Tract: Adrenal glands are preserved. No enhancing renal mass or collecting  system dilatation. The ureters have normal course and caliber extending down to the urinary bladder. Preserved contour of the urinary bladder. Stomach/Bowel: On this non oral contrast exam, the large bowel has a normal course and caliber with some scattered stool. Normal appendix. The small bowel is nondilated. No free air or free fluid. Stomach is underdistended. Borderline wall thickening or fold thickening. Please correlate for any symptoms. Vascular/Lymphatic: Aortic atherosclerosis. No enlarged abdominal or pelvic lymph nodes. Reproductive: Uterus and bilateral adnexa are unremarkable. Other: No abdominal wall hernia or abnormality. No abdominopelvic ascites. Musculoskeletal: Curvature of the spine. Scattered degenerative changes of the spine and pelvis. IMPRESSION: No bowel obstruction, free air or free fluid. Normal appendix. Stomach is underdistended but there is some questionable fold thickening. Please correlate with any particular symptoms. Fatty liver infiltration.  Previous cholecystectomy. Electronically Signed   By: Karen Kays M.D.   On: 08/31/2023 12:02    Procedures Procedures    Medications Ordered in ED Medications  iohexol (OMNIPAQUE) 300 MG/ML solution 100 mL (100 mLs Intravenous Contrast Given 08/31/23 1107)    ED Course/ Medical Decision Making/ A&P                                 Medical Decision Making Amount and/or Complexity of Data Reviewed Labs: ordered. Radiology: ordered.  Risk Prescription drug management.   This patient presents to the ED with concern for epigastric abdominal pain, abdominal bloating and distention. This involves an extensive number of treatment options, and is a complaint that carries with it a high risk of complications and morbidity.  The differential diagnosis includes gastroparesis versus gastritis versus bowel obstruction ileus versus other  Co-morbidities that complicate the patient evaluation: History of suspected gastroparesis or  pyloric stenosis per external records  External records from outside source obtained and reviewed including gastroenterology [facility] office record as noted above including recent EGD from 2018  I ordered and personally interpreted labs.  The pertinent results include: No emergent findings  I ordered imaging studies including CT abdomen pelvis with contrast I independently visualized and interpreted imaging which showed no emergent findings I agree with the radiologist interpretation   I have reviewed the patients home medicines and have made adjustments as needed   Dispostion:  After consideration of the diagnostic results and the patients response to treatment, I feel that the patent would benefit from outpatient follow-up.  I prescribed her Bentyl for what appears to be a spasmodic abdominal pain, likely GI pain, as well as Carafate for her reflux symptoms.  Her family members here to pick her up.         Final Clinical Impression(s) / ED Diagnoses Final diagnoses:  Epigastric pain    Rx / DC Orders ED Discharge Orders          Ordered    sucralfate (CARAFATE) 1 g tablet  3 times daily with meals & bedtime        08/31/23 1221    dicyclomine (BENTYL) 20 MG tablet  2 times daily PRN        08/31/23 1221              Terald Sleeper, MD 08/31/23 1506

## 2023-08-31 NOTE — Telephone Encounter (Signed)
Patient came into the office stating that she wants to be seen for abdominal pain. She states last GI hx was in 2019 in [facility] [place]. New patient appointment scheduled for December.

## 2023-09-14 ENCOUNTER — Ambulatory Visit (INDEPENDENT_AMBULATORY_CARE_PROVIDER_SITE_OTHER): Payer: Medicare HMO | Admitting: Gastroenterology

## 2023-09-14 ENCOUNTER — Encounter: Payer: Self-pay | Admitting: Gastroenterology

## 2023-09-14 ENCOUNTER — Telehealth: Payer: Self-pay

## 2023-09-14 VITALS — BP 140/80 | HR 82 | Ht 61.0 in | Wt 137.0 lb

## 2023-09-14 DIAGNOSIS — R1013 Epigastric pain: Secondary | ICD-10-CM

## 2023-09-14 DIAGNOSIS — K581 Irritable bowel syndrome with constipation: Secondary | ICD-10-CM

## 2023-09-14 DIAGNOSIS — Z8719 Personal history of other diseases of the digestive system: Secondary | ICD-10-CM | POA: Diagnosis not present

## 2023-09-14 DIAGNOSIS — Z860100 Personal history of colon polyps, unspecified: Secondary | ICD-10-CM

## 2023-09-14 DIAGNOSIS — R9389 Abnormal findings on diagnostic imaging of other specified body structures: Secondary | ICD-10-CM

## 2023-09-14 DIAGNOSIS — R140 Abdominal distension (gaseous): Secondary | ICD-10-CM

## 2023-09-14 MED ORDER — NA SULFATE-K SULFATE-MG SULF 17.5-3.13-1.6 GM/177ML PO SOLN: 1.0000 | Freq: Once | ORAL | 0 refills | Status: AC

## 2023-09-14 NOTE — Progress Notes (Signed)
Chief Complaint: GI problems  Referring Provider:  Jessica Priest, MD      ASSESSMENT AND PLAN;   #1. Epi pain with abn CT showing gastric wall thickening. H/O pyloric stenosis s/p dilatation to 14 mm 2018. Dx with functional dyspepsia. Neg eval for gastroparesis including GES, EGG 2019.  Previous cholecystectomy.  #2. H/O polyps  #3. IBS-C with bloating  Plan: -EGD/colon (with pyloric dil, if needed) after cardiology clearence. -Continue protonix 40mg  po QD -Miralax 17g po every day -Continie bentyl 20mg  po BID prn -I have reassured patient.   HPI:    Autumn Fitzgerald is a 77 y.o. female  hx of HOCM s/p septal myectomy in 2005, GERD, HTN, HLD, history of near syncope s/p loop recorder that was explanted in 2017, hypothyroidism, LBBB and prediabetes, meningioma, Nl EF (2DE-06/2022 >75%), anxiety, s/p cholecystectomy  Seen in ED 08/31/2023 With epi pain, bloating. Nl CBC, CMP, lipase CT Abdo/pelvis which showed questionable  gastric wall thickening. advised EGD.  She has been having epi pain x since 2015, worse with eating.  Associated with bloating and early satiety.  She also has intermittent nausea.  No dysphagia.  Has seen several gastroenterologists with extensive GI workup.  -Seen by Dr. Loreta Ave had neg EGD in 2015, normal GES, negative CT Abdo/pelvis, negative hydrogen SIBO breath test 08/2017 -Seen by Dr. Mare Loan underwent EGD 11/05/2017 which showed pyloric stenosis, dilated to 14 mm.  Multiple hyperplastic gastric polyps.  She did not have any benefit after pyloric dilatation.  Underwent repeat GES on 01/03/2018 which was normal.  She also had normal EGD on 01/10/2018.  Diagnosed with functional dyspepsia.  Given trial of amitriptyline 25 mg p.o. nightly which she did not tolerate. -She has tried multiple medications including probiotics, PPIs, IBgard, FD guard without any significant relief.   Also with longstanding history of constipation with colonoscopy 07/2018  showing small sessile polyps.  It was recommended to perform repeat colonoscopy in 5 years.  No jaundice dark urine or pale stools.    Past GI WU:  CT AP with contrast 08/31/2023 No bowel obstruction, free air or free fluid. Normal appendix. Stomach is underdistended but there is some questionable fold thickening. Please correlate with any particular symptoms. Fatty liver infiltration.  Previous cholecystectomy.  EGD 11/05/2017 (Dr Truitt Merle): Pyloric stenosis, dil 14 mm. Multiple small gastric polyps  Colon 07/15/2018: (Dr. Truitt Merle) Small sessile polyps s/p polypectomy.  I could not find biopsy results.  Repeat 5 years.  EGD 11/24/2013: Dr. Caroline Sauger polyps s/p polypectomy using hot snare, small HH.  Colonoscopy 11/24/2013: Dr. Rosana Fret but normal.  Some stool in the colon.  Internal hemorrhoids.  Per report repeat 10 years.  MRI head with contrast 02/2022 1. Negative for retrocochlear lesion. 2. Mild mastoid opacification on the left more than right. 3. 9 mm high right frontal meningioma without worrisome change from 2016.  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)  Hypothyroidism    followed by pcp   LBBB (left bundle branch block)    Mild persistent allergic asthma    Palpitations    Panic attacks    Pneumonia    PONV (postoperative nausea and vomiting)    Pre-diabetes    Wears glasses     Past Surgical History:  Procedure Laterality Date   CARDIAC SURGERY  02/05/2007   in [place]   Septal Myectomy  for HOCM    CATARACT EXTRACTION W/ INTRAOCULAR LENS  IMPLANT, BILATERAL  right 07-23-2017;  left 10-15-2017   COLONOSCOPY N/A 11/24/2013   Procedure: COLONOSCOPY;  Surgeon: Charna Elizabeth, MD;  Location: [facility];  Service: Endoscopy;  Laterality: N/A;   DOPPLER ECHOCARDIOGRAPHY  03/14/2013   EF 65-70%,trivial AI,mild-mod. MR,Mod. TR,mod. concentric hypertrophy   EP IMPLANTABLE DEVICE N/A 08/22/2016   Procedure: Loop Recorder Removal;  Surgeon: Thurmon Fair, MD;  Location: [facility];  Service: Cardiovascular;  Laterality: N/A;   ESOPHAGOGASTRODUODENOSCOPY N/A 11/24/2013   Procedure: ESOPHAGOGASTRODUODENOSCOPY (EGD);  Surgeon: Charna Elizabeth, MD;  Location: [facility];  Service: Endoscopy;  Laterality: N/A;   GLAUCOMA SURGERY Bilateral 2019   "release pressure   KNEE ARTHROSCOPY Right 10/17/2013   Procedure: ARTHROSCOPY RIGHT KNEE;  Surgeon: Verlee Rossetti, MD;  Location: [facility] OR;  Service: Orthopedics;  Laterality: Right;   KNEE ARTHROSCOPY Left 11/08/2022   Procedure: ARTHROSCOPY KNEE; MEDIAL AND LATERAL MENISECTOMIES, CHONDROPLASTY;  Surgeon: Beverely Low, MD;  Location: [facility];  Service: Orthopedics;  Laterality: Left;  60 min   LAPAROSCOPIC CHOLECYSTECTOMY  02-03-2010   dr d blackman  @[facility]    LOOP RECORDER IMPLANT  03/18/2013   LOOP RECORDER IMPLANT N/A 03/18/2013   Procedure: LOOP RECORDER IMPLANT;  Surgeon: Thurmon Fair, MD;  Location: [facility]ATH [facility];  Service: Cardiovascular;  Laterality: N/A;   SHOULDER ARTHROSCOPY WITH ROTATOR CUFF REPAIR Right 01/16/2019   Procedure: Right shoulder arthroscopy with subacromial decompression, distal clavicle resection, rotator cuff repair, bicep tenotomy;  Surgeon: Francena Hanly, MD;  Location: [facility];  Service: Orthopedics;  Laterality: Right;   TUBAL LIGATION  yrs ago    Family History  Problem Relation Age of Onset   Cancer Mother        breast   Hypertension Mother    Breast cancer Mother    Cancer Father        prostate   Diabetes Sister    Heart  attack Sister    Cancer Brother    Liver disease Neg Hx    Colon cancer Neg Hx    Esophageal cancer Neg Hx     Social History   Tobacco Use   Smoking status: Never   Smokeless tobacco: Never  Vaping Use   Vaping status: Never Used  Substance Use Topics   Alcohol use: No   Drug use: No    Current Outpatient Medications  Medication Sig Dispense Refill   albuterol (VENTOLIN HFA) 108 (90 Base) MCG/ACT inhaler INHALE 2 PUFFS INTO THE LUNGS EVERY 4 HOURS AS NEEDED FOR WHEEZING OR SHORTNESS OF BREATH 18 g 1   atenolol (TENORMIN) 25 MG tablet Take 0.5 tablets (12.5 mg total) by mouth daily. 30 tablet 3   baclofen 5 MG TABS Take 1 tablet (5 mg total) by mouth 2 (two) times daily as needed for muscle spasms. 14 tablet 0   Boswellia-Glucosamine-Vit D (OSTEO BI-FLEX ONE PER DAY PO) Take 1 tablet by mouth.     carboxymethylcellul-glycerin (REFRESH RELIEVA) 0.5-0.9 % ophthalmic solution Place  1 drop into both eyes daily. 45 mL 1   Cholecalciferol 50 MCG (2000 UT) TABS Take 2,000 Units by mouth daily.     clobetasol ointment (TEMOVATE) 0.05 % Apply to affected area every night for 4 weeks, then every other day for 4 weeks and then twice a week for 4 weeks or until resolution. (Patient taking differently: Apply 1 Application topically 3 (three) times a week.) 30 g 5   clonazePAM (KLONOPIN) 2 MG tablet Take 2 mg by mouth daily as needed.     dicyclomine (BENTYL) 20 MG tablet Take 1 tablet (20 mg total) by mouth 2 (two) times daily as needed for up to 30 doses for spasms. For cramping abdominal pain 30 tablet 0   ezetimibe-simvastatin (VYTORIN) 10-40 MG per tablet Take 1 tablet by mouth every evening.      FLOVENT HFA 44 MCG/ACT inhaler Inhale 2 puffs into the lungs daily. INHALE 2 PUFFS INTO THE LUNGS DAILY WITH SPACER. 10.6 g 5   fluticasone (FLONASE) 50 MCG/ACT nasal spray Place 1 spray into both nostrils daily. 48 g 1   Fluticasone Furoate (ARNUITY ELLIPTA) 100 MCG/ACT AEPB Inhale 1 puff into the  lungs daily. 30 each 5   levothyroxine (SYNTHROID) 50 MCG tablet Take 50 mcg by mouth daily.      lidocaine (LIDODERM) 5 % Place 1 patch onto the skin daily. Remove & Discard patch within 12 hours or as directed by MD 30 patch 0   Multiple Vitamin (MULTIVITAMIN) tablet Take 1 tablet by mouth daily.     pantoprazole (PROTONIX) 40 MG tablet Take 1 tablet (40 mg total) by mouth in the morning. 90 tablet 1   Polyethyl Glycol-Propyl Glycol 0.4-0.3 % SOLN Place one drop in both eyes daily as needed 30 mL 2   Propylene Glycol (SYSTANE COMPLETE) 0.6 % SOLN Apply 1 drop to eye 3 (three) times daily as needed. 45 mL 1   sucralfate (CARAFATE) 1 g tablet Take 1 tablet (1 g total) by mouth 4 (four) times daily -  with meals and at bedtime for 60 doses. 60 tablet 0   torsemide (DEMADEX) 5 MG tablet Take 5 mg by mouth daily.     No current facility-administered medications for this visit.    Allergies  Allergen Reactions   Amoxicillin-Pot Clavulanate Other (See Comments)    Severe pains and GI symptoms Did it involve swelling of the face/tongue/throat, SOB, or low BP? No Did it involve sudden or severe rash/hives, skin peeling, or any reaction on the inside of your mouth or nose? No Did you need to seek medical attention at a hospital or doctor's office? No When did it last happen?      within the past 10 years If all above answers are "NO", may proceed with cephalosporin use.    Temazepam Other (See Comments)    Dizziness and doesn't work for patient   Lipitor [Atorvastatin Calcium]     Muscle pain   Amoxicillin Nausea And Vomiting   Lansoprazole Palpitations    "Body felt sick"    Penicillin G Other (See Comments)    Unknown reaction     Review of Systems:  Constitutional: Denies fever, chills, diaphoresis, appetite change and fatigue.  HEENT: neg Respiratory: Denies SOB, DOE, cough, chest tightness,  and wheezing.   Cardiovascular: Denies chest pain, palpitations and leg swelling.   Genitourinary: Denies dysuria, urgency, frequency, hematuria, flank pain and difficulty urinating.  Musculoskeletal: Has myalgias, back pain, joint swelling, arthralgias and  gait problem.  Skin: No rash.  Neurological: Denies dizziness, seizures, syncope, weakness, light-headedness, numbness and headaches.  Hematological: Denies adenopathy. Easy bruising, personal or family bleeding history  Psychiatric/Behavioral: Has anxiety or depression     Physical Exam:    BP (!) 140/80   Pulse 82   Ht 5\' 1"  (1.549 m)   Wt 137 lb (62.1 kg)   BMI 25.89 kg/m  Wt Readings from Last 3 Encounters:  09/14/23 137 lb (62.1 kg)  08/03/23 131 lb (59.4 kg)  02/27/23 133 lb 6.4 oz (60.5 kg)   Constitutional:  Well-developed, in no acute distress. Psychiatric: Normal mood and affect. Behavior is normal. HEENT: Pupils normal.  Conjunctivae are normal. No scleral icterus. Cardiovascular: Normal rate, regular rhythm. No edema Pulmonary/chest: Effort normal and breath sounds normal. No wheezing, rales or rhonchi. Abdominal: Soft, nondistended. Nontender. Bowel sounds active throughout. There are no masses palpable. No hepatomegaly. Rectal: Deferred Neurological: Alert and oriented to person place and time. Skin: Skin is warm and dry. No rashes noted.  Data Reviewed: I have personally reviewed following labs and imaging studies  CBC:    Latest Ref Rng & Units 08/31/2023   10:14 AM 07/29/2022    2:47 PM 04/21/2021    3:13 PM  CBC  WBC 4.0 - 10.5 K/uL 5.2  7.4  6.8   Hemoglobin 12.0 - 15.0 g/dL 16.1  09.6  04.5   Hematocrit 36.0 - 46.0 % 42.3  42.6  45.8   Platelets 150 - 400 K/uL 256  276  295     CMP:    Latest Ref Rng & Units 08/31/2023   10:14 AM 10/27/2022    1:55 PM 07/29/2022    2:47 PM  CMP  Glucose 70 - 99 mg/dL 95  81  409   BUN 8 - 23 mg/dL 10  12  11    Creatinine 0.44 - 1.00 mg/dL 8.11  9.14  7.82   Sodium 135 - 145 mmol/L 141  140  137   Potassium 3.5 - 5.1 mmol/L 3.7  3.8  3.6    Chloride 98 - 111 mmol/L 104  104  102   CO2 22 - 32 mmol/L 27  30  23    Calcium 8.9 - 10.3 mg/dL 9.2  9.0  9.1   Total Protein 6.5 - 8.1 g/dL 7.2     Total Bilirubin 0.3 - 1.2 mg/dL 1.5     Alkaline Phos 38 - 126 U/L 85     AST 15 - 41 U/L 24     ALT 0 - 44 U/L 21         Radiology Studies: CT ABDOMEN PELVIS W CONTRAST  Result Date: 08/31/2023 CLINICAL DATA:  Abdominal pain for 2 months which is getting worse EXAM: CT ABDOMEN AND PELVIS WITH CONTRAST TECHNIQUE: Multidetector CT imaging of the abdomen and pelvis was performed using the standard protocol following bolus administration of intravenous contrast. RADIATION DOSE REDUCTION: This exam was performed according to the departmental dose-optimization program which includes automated exposure control, adjustment of the mA and/or kV according to patient size and/or use of iterative reconstruction technique. CONTRAST:  OMNIPAQUE IOHEXOL 300 MG/ML  SOLN COMPARISON:  12/06/2016 FINDINGS: Lower chest: There is some linear opacity along the lung bases likely scar or atelectasis. No pleural effusion. Status post median sternotomy. Hepatobiliary: Fatty liver infiltration. Previous cholecystectomy. Patent portal vein. Pancreas: Unremarkable. No pancreatic ductal dilatation or surrounding inflammatory changes. Spleen: Normal in size without focal abnormality. Adrenals/Urinary Tract:  Adrenal glands are preserved. No enhancing renal mass or collecting system dilatation. The ureters have normal course and caliber extending down to the urinary bladder. Preserved contour of the urinary bladder. Stomach/Bowel: On this non oral contrast exam, the large bowel has a normal course and caliber with some scattered stool. Normal appendix. The small bowel is nondilated. No free air or free fluid. Stomach is underdistended. Borderline wall thickening or fold thickening. Please correlate for any symptoms. Vascular/Lymphatic: Aortic atherosclerosis. No enlarged  abdominal or pelvic lymph nodes. Reproductive: Uterus and bilateral adnexa are unremarkable. Other: No abdominal wall hernia or abnormality. No abdominopelvic ascites. Musculoskeletal: Curvature of the spine. Scattered degenerative changes of the spine and pelvis. IMPRESSION: No bowel obstruction, free air or free fluid. Normal appendix. Stomach is underdistended but there is some questionable fold thickening. Please correlate with any particular symptoms. Fatty liver infiltration.  Previous cholecystectomy. Electronically Signed   By: Karen Kays M.D.   On: 08/31/2023 12:02      Edman Circle, MD 09/14/2023, 10:35 AM  Cc: Jessica Priest, MD

## 2023-09-14 NOTE — Telephone Encounter (Deleted)
-----   Message from NULTY,JOHN sent at 09/14/2023  3:42 PM EDT ----- Regarding: RE: Clearance Khalil Belote,  This pt is cleared for anesthetic care at [facility].  Thanks,  Cathlyn Parsons ----- Message ----- From: Lurline Hare, CMA Sent: 09/14/2023   1:49 PM EDT To: Cathlyn Parsons, CRNA Subject: Clearance                                      Can patient have her EGD/Colon in the [facility] or does she needs to be in the hospital? She said she has done it in the hospital before

## 2023-09-14 NOTE — Telephone Encounter (Signed)
-----   Message from NULTY,JOHN sent at 09/14/2023  3:42 PM EDT ----- Regarding: RE: Clearance Khalil Belote,  This pt is cleared for anesthetic care at [facility].  Thanks,  Cathlyn Parsons ----- Message ----- From: Lurline Hare, CMA Sent: 09/14/2023   1:49 PM EDT To: Cathlyn Parsons, CRNA Subject: Clearance                                      Can patient have her EGD/Colon in the [facility] or does she needs to be in the hospital? She said she has done it in the hospital before

## 2023-09-14 NOTE — Telephone Encounter (Signed)
   Name: Autumn Fitzgerald  DOB: 1946/09/10  MRN: [id number]  Primary Cardiologist: Thurmon Fair, MD   Preoperative team, please contact this patient and set up a phone call appointment for further preoperative risk assessment. Please obtain consent and complete medication review. Thank you for your help.Last seen by Dr. [name] on 11/22/2022  I confirm that guidance regarding antiplatelet and oral anticoagulation therapy has been completed and, if necessary, noted below. Not on any anticoagulants or antiplatelets   I also confirmed the patient resides in the state of [place]. As per [facility] Medical Board telemedicine laws, the patient must reside in the state in which the provider is licensed.   Joni Reining, NP 09/14/2023, 3:57 PM [facility]

## 2023-09-14 NOTE — Patient Instructions (Addendum)
_______________________________________________________  If your blood pressure at your visit was 140/90 or greater, please contact your primary care physician to follow up on this.  _______________________________________________________  If you are age 78 or older, your body mass index should be between 23-30. Your Body mass index is 25.89 kg/m. If this is out of the aforementioned range listed, please consider follow up with your Primary Care Provider.  If you are age 24 or younger, your body mass index should be between 19-25. Your Body mass index is 25.89 kg/m. If this is out of the aformentioned range listed, please consider follow up with your Primary Care Provider.   ________________________________________________________  The [facility] providers would like to encourage you to use [facility] to communicate with providers for non-urgent requests or questions.  Due to long hold times on the telephone, sending your provider a message by [facility] may be a faster and more efficient way to get a response.  Please allow 48 business hours for a response.  Please remember that this is for non-urgent requests.  _______________________________________________________  Continue protonix 40mg  daily, Miralax 17g daily and Bentyl 20mg  2 times a day as needed  We have sent the following medications to your pharmacy for you to pick up at your convenience: Suprep  You will be contacted by our office prior to your procedure regarding clearance.  If you do not hear from our office 1 week prior to your scheduled procedure, please call [phone] to discuss.   You have been scheduled for an endoscopy and colonoscopy. Please follow the written instructions given to you at your visit today.  Please pick up your prep supplies at the pharmacy within the next 1-3 days.  If you use inhalers (even only as needed), please bring them with you on the day of your procedure.  DO NOT TAKE 7 DAYS PRIOR TO  TEST- Trulicity (dulaglutide) Ozempic, Wegovy (semaglutide) Mounjaro (tirzepatide) Bydureon Bcise (exanatide extended release)  DO NOT TAKE 1 DAY PRIOR TO YOUR TEST Rybelsus (semaglutide) Adlyxin (lixisenatide) Victoza (liraglutide) Byetta (exanatide) ___________________________________________________________________________  Thank you,  Dr. Lynann Bologna

## 2023-09-14 NOTE — Telephone Encounter (Signed)
[facility] Pre-operative Risk Assessment     Request for surgical clearance:     Endoscopy Procedure  What type of surgery is being performed?     EGD/Colon  When is this surgery scheduled?     10-31-2023  What type of clearance is required ?   Medical  Are there any medications that need to be held prior to surgery and how long? Can we see if patient is cleared to have an EGD/Colonoscopy  Practice name and name of physician performing surgery?      [facility]  What is your office phone and fax number?      Phone- [phone]  Fax- [phone]  Anesthesia type (None, local, MAC, general) ?       MAC

## 2023-09-18 ENCOUNTER — Telehealth: Payer: Self-pay | Admitting: Cardiovascular Disease

## 2023-09-18 NOTE — Telephone Encounter (Signed)
Patient is returning call to schedule a tele visit appt.

## 2023-09-18 NOTE — Telephone Encounter (Signed)
Pt has been scheduled for a tele visit 10/04/23 10:00.  Consent on file / medications reconciled.     Patient Consent for Virtual Visit        Autumn Fitzgerald has provided verbal consent on 09/18/2023 for a virtual visit (video or telephone).   CONSENT FOR VIRTUAL VISIT FOR:  Autumn Fitzgerald  By participating in this virtual visit I agree to the following:  I hereby voluntarily request, consent and authorize [facility] and its employed or contracted physicians, physician assistants, nurse practitioners or other licensed health care professionals (the Practitioner), to provide me with telemedicine health care services (the "Services") as deemed necessary by the treating Practitioner. I acknowledge and consent to receive the Services by the Practitioner via telemedicine. I understand that the telemedicine visit will involve communicating with the Practitioner through live audiovisual communication technology and the disclosure of certain medical information by electronic transmission. I acknowledge that I have been given the opportunity to request an in-person assessment or other available alternative prior to the telemedicine visit and am voluntarily participating in the telemedicine visit.  I understand that I have the right to withhold or withdraw my consent to the use of telemedicine in the course of my care at any time, without affecting my right to future care or treatment, and that the Practitioner or I may terminate the telemedicine visit at any time. I understand that I have the right to inspect all information obtained and/or recorded in the course of the telemedicine visit and may receive copies of available information for a reasonable fee.  I understand that some of the potential risks of receiving the Services via telemedicine include:  Delay or interruption in medical evaluation due to technological equipment failure or disruption; Information transmitted may not be  sufficient (e.g. poor resolution of images) to allow for appropriate medical decision making by the Practitioner; and/or  In rare instances, security protocols could fail, causing a breach of personal health information.  Furthermore, I acknowledge that it is my responsibility to provide information about my medical history, conditions and care that is complete and accurate to the best of my ability. I acknowledge that Practitioner's advice, recommendations, and/or decision may be based on factors not within their control, such as incomplete or inaccurate data provided by me or distortions of diagnostic images or specimens that may result from electronic transmissions. I understand that the practice of medicine is not an exact science and that Practitioner makes no warranties or guarantees regarding treatment outcomes. I acknowledge that a copy of this consent can be made available to me via my patient portal [facility] MyChart), or I can request a printed copy by calling the office of [facility].    I understand that my insurance will be billed for this visit.   I have read or had this consent read to me. I understand the contents of this consent, which adequately explains the benefits and risks of the Services being provided via telemedicine.  I have been provided ample opportunity to ask questions regarding this consent and the Services and have had my questions answered to my satisfaction. I give my informed consent for the services to be provided through the use of telemedicine in my medical care

## 2023-09-18 NOTE — Telephone Encounter (Signed)
Pt has been scheduled for a tele visit 10/04/23 10:00.  Consent on file / medications reconciled.

## 2023-09-18 NOTE — Telephone Encounter (Signed)
Lvm for pt to call back and ask for pre op team to set up tele appt.

## 2023-09-20 IMAGING — MR MR HEAD WO/W CM
16 series · 42 of 48 positions shown · IV contrast (multihance)
Comparison: 02/02/2022 CTA.  Brain MRI 10/15/2015

CLINICAL DATA: Tinnitus. Pain and noise in the bilateral ears.
Tremors and difficulty walking. History of cerebral meningioma

EXAM:
MRI HEAD WITHOUT AND WITH CONTRAST
TECHNIQUE: Multiplanar, multiecho pulse sequences of the brain and surrounding
structures were obtained without and with intravenous contrast.
CONTRAST:  11mL MULTIHANCE GADOBENATE DIMEGLUMINE 529 MG/ML IV SOLN

[Series 2: T1 · sagittal · 5.0mm · 0.47mm/px · 1 of 24 slices shown (1 of 3)]
[im 1/24]
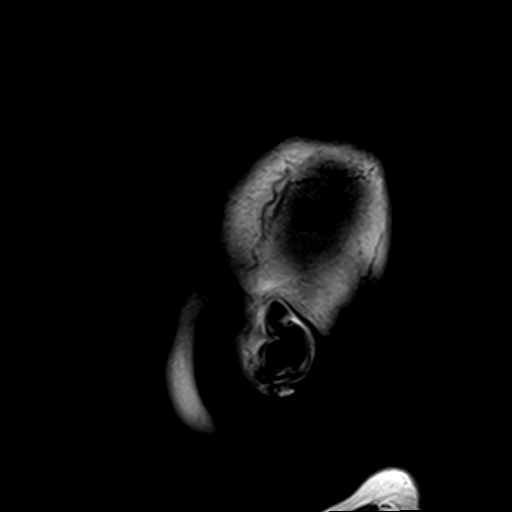

[Series 3: DWI · axial · 3.0mm · 1.80mm/px · z∈[-72,+78]mm · 7 of 102 slices shown (1 of 4)]
[im 1/102]
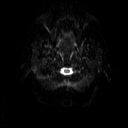
[im 17/102]
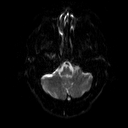
[im 34/102]
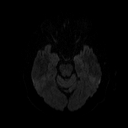
[im 51/102]
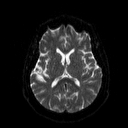
[im 68/102]
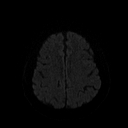
[im 85/102]
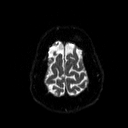
[im 102/102]
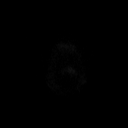

[Series 4: DWI · axial · 3.0mm · 1.80mm/px · z∈[-72,+78]mm · 4 of 51 slices shown (2 of 4)]
[im 1/51]
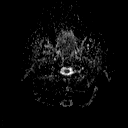
[im 17/51]
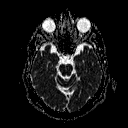
[im 34/51]
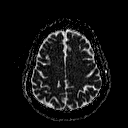
[im 51/51]
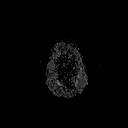

[Series 5: DWI · coronal · 5.0mm · 1.80mm/px · 5 of 68 slices shown (3 of 4)]
[im 1/68]
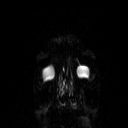
[im 17/68]
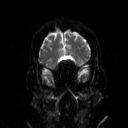
[im 34/68]
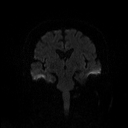
[im 51/68]
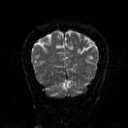
[im 68/68]
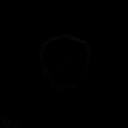

[Series 6: DWI · coronal · 5.0mm · 1.80mm/px · 2 of 34 slices shown (4 of 4)]
[im 1/34]
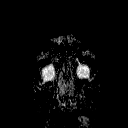
[im 34/34]
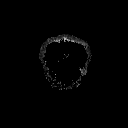

[Series 7: T2 · axial · 5.0mm · 0.60mm/px · z∈[-74,+81]mm · 2 of 24 slices shown (1 of 2)]
[im 1/24]
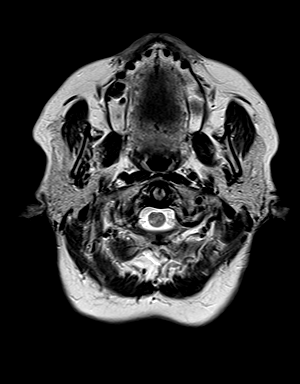
[im 24/24]
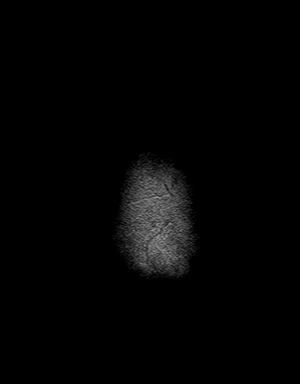

[Series 8: FLAIR · axial · 3.0mm · 0.45mm/px · z∈[-71,+77]mm · 2 of 33 slices shown]
[im 1/33]
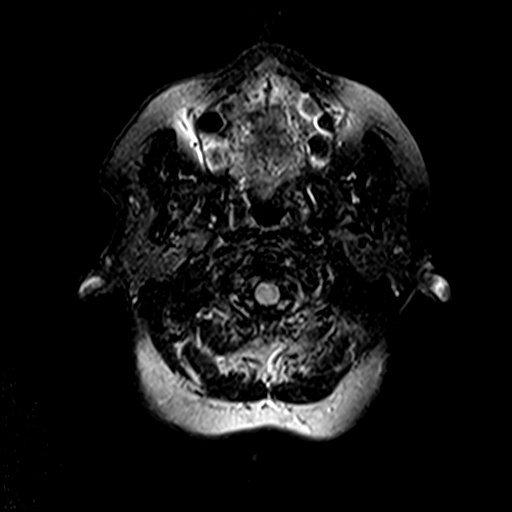
[im 33/33]
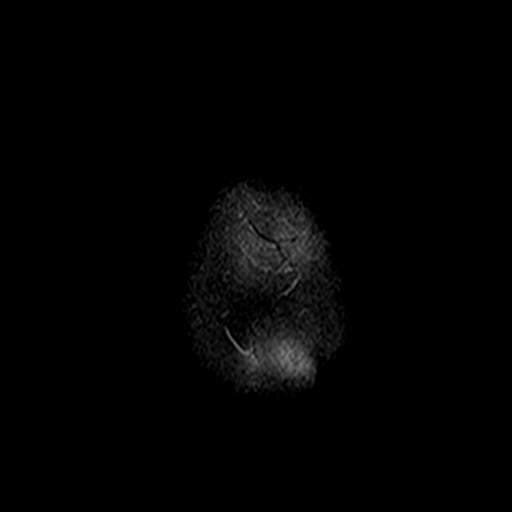

[Series 10: swi_images · axial · 4.0mm · 0.90mm/px · z∈[-75,+81]mm · 3 of 40 slices shown]
[im 1/40]
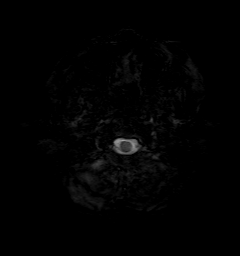
[im 20/40]
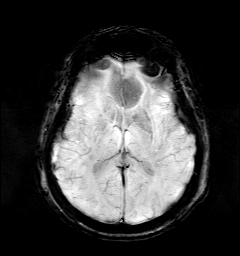
[im 40/40]
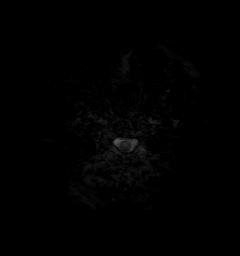

[Series 11: T1 · coronal · 3.0mm · 0.35mm/px · 1 of 18 slices shown (2 of 3)]
[im 1/18]
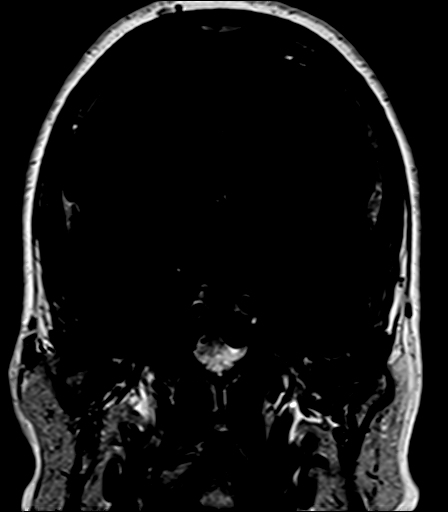

[Series 12: T1 · axial · 3.0mm · 0.35mm/px · 1 of 16 slices shown (3 of 3)]
[im 1/16]
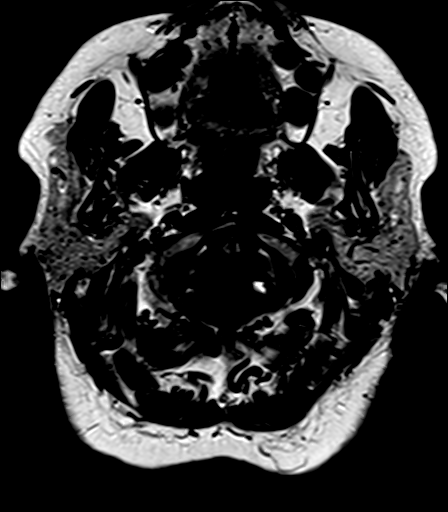

[Series 13: bSSFP · axial · 1.0mm · 0.28mm/px · z∈[-70,-19]mm · 4 of 52 slices shown]
[im 1/52]
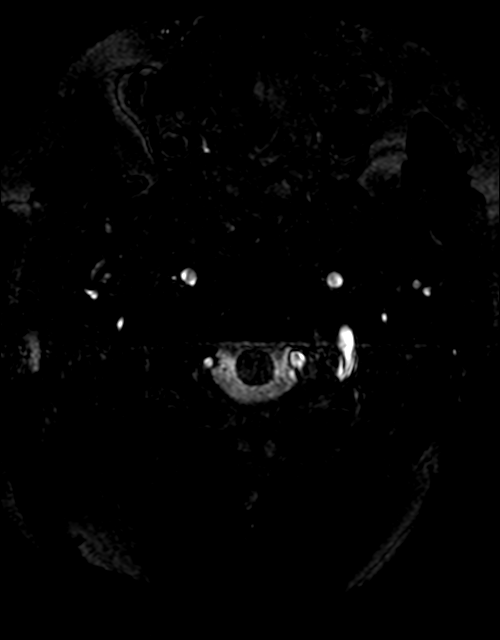
[im 18/52]
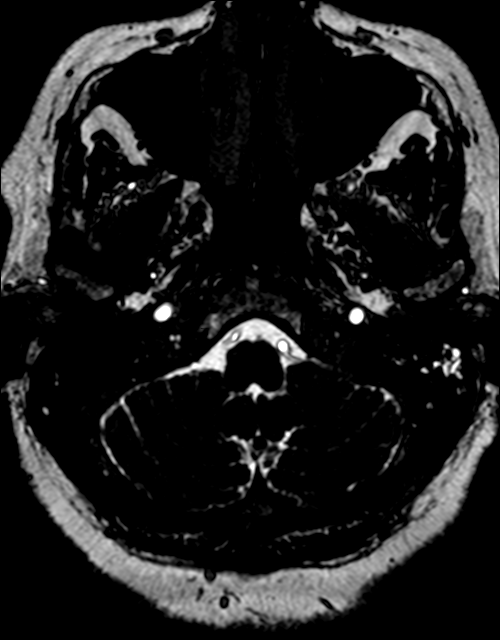
[im 35/52]
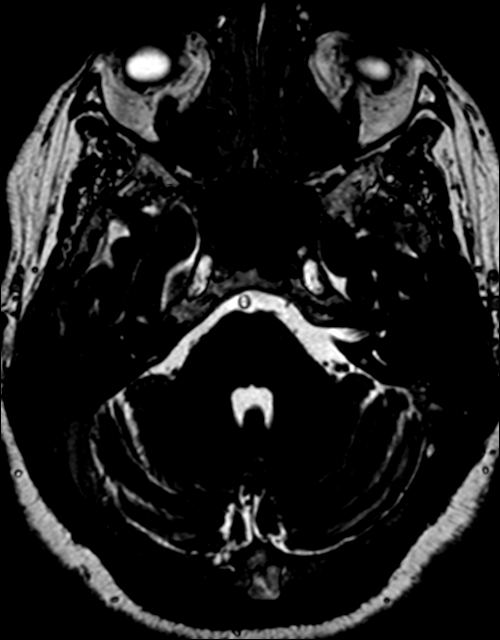
[im 52/52]
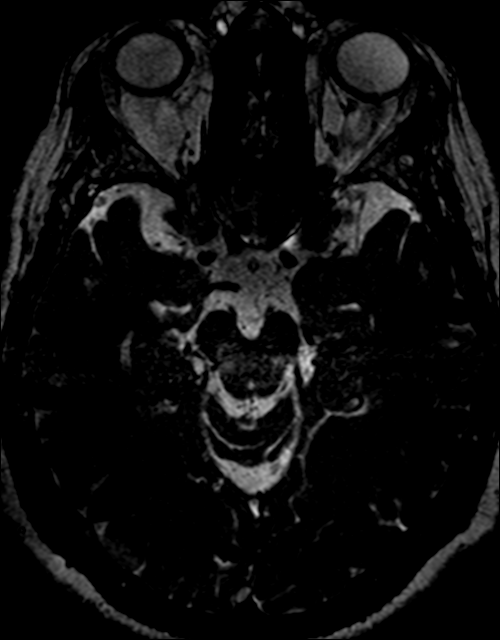

[Series 14: T1 post-contrast · coronal · 3.0mm · 0.35mm/px · 1 of 18 slices shown (1 of 3)]
[im 1/18]
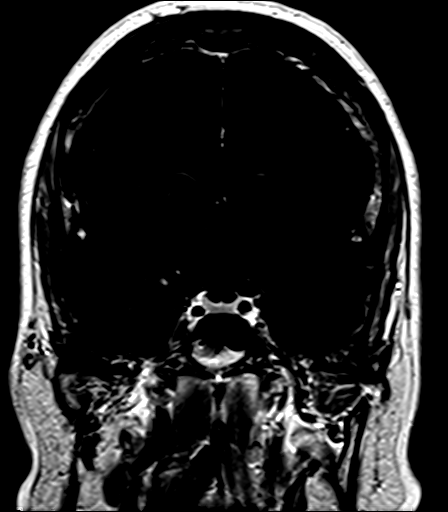

[Series 15: T1 post-contrast · axial · 3.0mm · 0.35mm/px · 1 of 16 slices shown (2 of 3)]
[im 1/16]
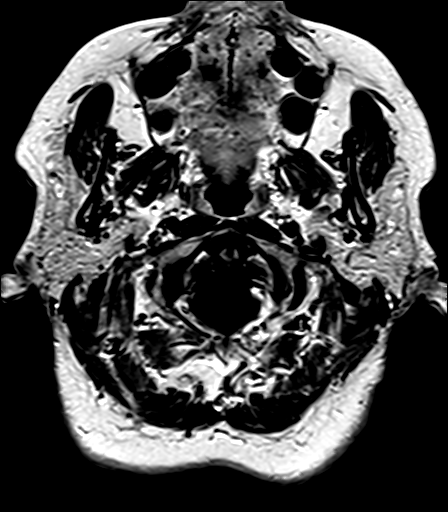

[Series 16: T2 · coronal · 5.0mm · 0.45mm/px · 2 of 26 slices shown (2 of 2)]
[im 1/26]
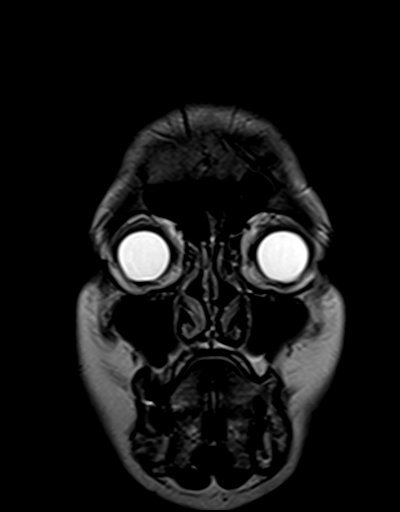
[im 26/26]
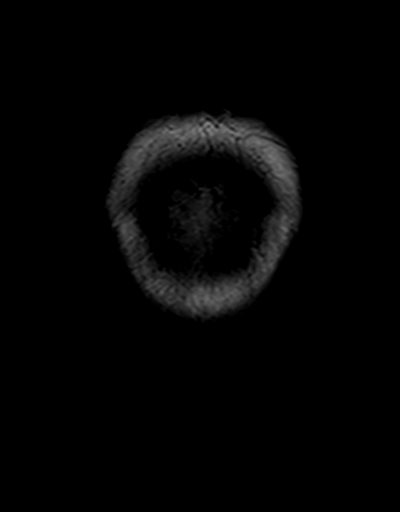

[Series 17: t1_mpr_tra post · axial · 1.0mm · 0.75mm/px · z∈[-67,-5]mm · 4 of 144 slices shown]
[im 1/144]
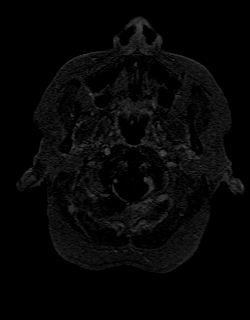
[im 16/144]
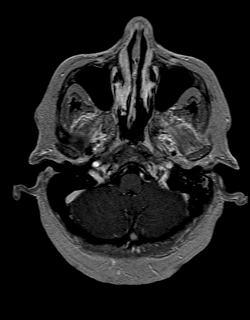
[im 48/144]
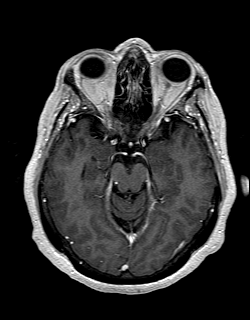
[im 64/144]
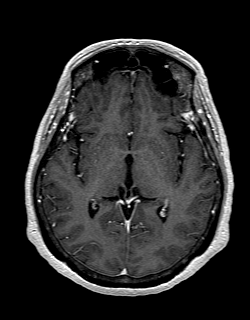

[Series 18: T1 post-contrast · sagittal · 5.0mm · 0.47mm/px · 2 of 24 slices shown (3 of 3)]
[im 1/24]
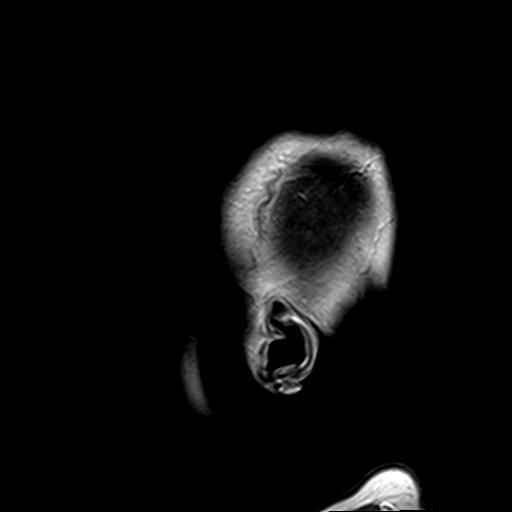
[im 24/24]
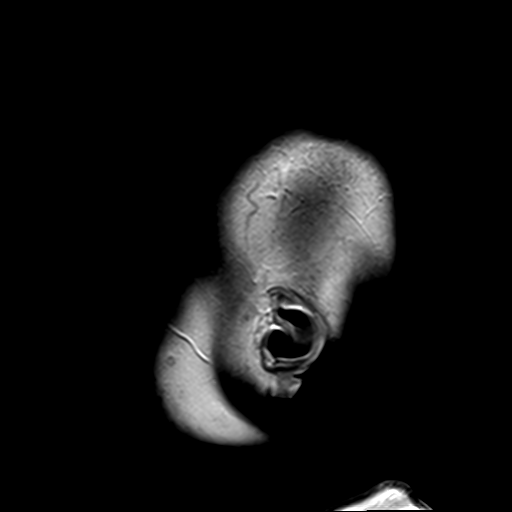

[42 of 48 positions shown; findings below may reference images not displayed]

FINDINGS: Brain: Symmetric normal labyrinthine signal. Negative for
retrocochlear mass or inflammatory enhancement.

High right parafalcine meningioma measuring 9 mm, 7 mm in 6330. No
associated mass effect or brain edema. Age normal brain volume and
white matter appearance.

Vascular: Major flow voids and vascular enhancements are preserved.

Skull and upper cervical spine: Normal marrow signal

Sinuses/Orbits: Mild opacification of left more than right mastoid
air cells with negative nasopharynx.
IMPRESSION: 1. Negative for retrocochlear lesion.
2. Mild mastoid opacification on the left more than right.
3. 9 mm high right frontal meningioma without worrisome change from

## 2023-09-27 ENCOUNTER — Other Ambulatory Visit: Payer: Self-pay | Admitting: Allergy and Immunology

## 2023-10-01 ENCOUNTER — Ambulatory Visit: Payer: Medicare HMO | Admitting: Obstetrics and Gynecology

## 2023-10-01 ENCOUNTER — Encounter: Payer: Self-pay | Admitting: Obstetrics and Gynecology

## 2023-10-01 VITALS — BP 150/97 | HR 77 | Wt 134.0 lb

## 2023-10-01 DIAGNOSIS — L900 Lichen sclerosus et atrophicus: Secondary | ICD-10-CM | POA: Diagnosis not present

## 2023-10-01 MED ORDER — CLOBETASOL PROPIONATE 0.05 % EX OINT: 1.0000 | TOPICAL_OINTMENT | CUTANEOUS | 3 refills | Status: DC

## 2023-10-01 NOTE — Progress Notes (Signed)
77 yo P4 with BMI 25, postmenopausal who returns for follow up on lichen sclerosis. Patient reports applying clobetasol cream 3 times a week. She denies any vulva pruritus. She is not sexually active. She denies any episodes of postmenopausal vaginal bleeding. She denies urinary incontinence. She is being followed by a cardiologist for cardiomyopathy. Patient is without any new complaints.   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)    Hypothyroidism    followed by pcp   LBBB (left bundle branch block)    Mild persistent allergic asthma    Palpitations    Panic attacks    Pneumonia    PONV (postoperative nausea and vomiting)    Pre-diabetes    Wears glasses    Past Surgical History:  Procedure Laterality Date   CARDIAC SURGERY  02/05/2007   in [place]   Septal Myectomy  for HOCM   CATARACT EXTRACTION W/ INTRAOCULAR LENS  IMPLANT, BILATERAL  right 07-23-2017;  left 10-15-2017   COLONOSCOPY N/A 11/24/2013   Procedure: COLONOSCOPY;  Surgeon: Charna Elizabeth, MD;  Location: [facility];  Service: Endoscopy;  Laterality: N/A;   DOPPLER ECHOCARDIOGRAPHY  03/14/2013   EF 65-70%,trivial AI,mild-mod. MR,Mod. TR,mod. concentric hypertrophy   EP IMPLANTABLE DEVICE N/A 08/22/2016   Procedure: Loop Recorder Removal;  Surgeon: Thurmon Fair, MD;  [facility]: [facility];  Service: Cardiovascular;  Laterality: N/A;   ESOPHAGOGASTRODUODENOSCOPY N/A 11/24/2013   Procedure: ESOPHAGOGASTRODUODENOSCOPY (EGD);  Surgeon: Charna Elizabeth, MD;  Location: [facility];   Service: Endoscopy;  Laterality: N/A;   GLAUCOMA SURGERY Bilateral 2019   "release pressure   KNEE ARTHROSCOPY Right 10/17/2013   Procedure: ARTHROSCOPY RIGHT KNEE;  Surgeon: Verlee Rossetti, MD;  Location: [facility] OR;  Service: Orthopedics;  Laterality: Right;   KNEE ARTHROSCOPY Left 11/08/2022   Procedure: ARTHROSCOPY KNEE; MEDIAL AND LATERAL MENISECTOMIES, CHONDROPLASTY;  Surgeon: Beverely Low, MD;  Location: [facility];  Service: Orthopedics;  Laterality: Left;  60 min   LAPAROSCOPIC CHOLECYSTECTOMY  02-03-2010   dr d blackman  @[facility]    LOOP RECORDER IMPLANT  03/18/2013   LOOP RECORDER IMPLANT N/A 03/18/2013   Procedure: LOOP RECORDER IMPLANT;  Surgeon: Thurmon Fair, MD;  Location: [facility];  Service: Cardiovascular;  Laterality: N/A;   SHOULDER ARTHROSCOPY WITH ROTATOR CUFF REPAIR Right 01/16/2019   Procedure: Right shoulder arthroscopy with subacromial decompression, distal clavicle resection, rotator cuff repair, bicep tenotomy;  Surgeon: Francena Hanly, MD;  Location: [facility];  Service: Orthopedics;  Laterality: Right;   TUBAL LIGATION  yrs ago   Family History  Problem Relation Age of Onset   Cancer Mother        breast   Hypertension Mother    Breast cancer Mother    Cancer Father        prostate   Diabetes Sister    Heart attack Sister    Cancer Brother    Liver disease Neg Hx    Colon cancer Neg Hx  Esophageal cancer Neg Hx    Social History   Tobacco Use   Smoking status: Never   Smokeless tobacco: Never  Vaping Use   Vaping status: Never Used  Substance Use Topics   Alcohol use: No   Drug use: No   ROS See pertinent in HPI. All other systems reviewed and non contributory Blood pressure (!) 150/97, pulse 77, weight 134 lb (60.8 kg).   GENERAL: Well-developed, well-nourished female in no acute distress.  ABDOMEN: Soft, nontender, nondistended. No organomegaly. PELVIC: Normal external female genitalia. Vagina is pale and atrophic with evidence of lichen sclerosis at 6  and 12 o'clock approximately 1 cm lesions.  Normal discharge. Normal appearing cervix. Uterus is normal in size. No adnexal mass or tenderness. Chaperone present during the pelvic exam EXTREMITIES: No cyanosis, clubbing, or edema, 2+ distal pulses.  [name] 77 yo here for follow up on lichen sclerosis - Continue current regimen - Screening mammogram done with [facility] last week - RTC in 6 months - Follow up with specialist as scheduled - Elevated BP today and patient plans to follow up with cardiologist - Patient scheduled for colonoscopy next month

## 2023-10-04 ENCOUNTER — Ambulatory Visit: Payer: Medicare HMO | Attending: Nurse Practitioner | Admitting: Nurse Practitioner

## 2023-10-04 ENCOUNTER — Encounter: Payer: Self-pay | Admitting: Nurse Practitioner

## 2023-10-04 DIAGNOSIS — Z01810 Encounter for preprocedural cardiovascular examination: Secondary | ICD-10-CM | POA: Diagnosis not present

## 2023-10-04 NOTE — Progress Notes (Signed)
Virtual Visit via Telephone Note   Because of Autumn Fitzgerald's co-morbid illnesses, she is at least at moderate risk for complications without adequate follow up.  This format is felt to be most appropriate for this patient at this time.  The patient did not have access to video technology/had technical difficulties with video requiring transitioning to audio format only (telephone).  All issues noted in this document were discussed and addressed.  No physical exam could be performed with this format.  Please refer to the patient's chart for her consent to telehealth for [facility].  Evaluation Performed:  Preoperative cardiovascular risk assessment _____________   Date:  10/04/2023   Patient ID:  Autumn Fitzgerald, DOB 21-Jul-1946, MRN [id number] Patient Location:  Home Provider location:   Office  Primary Care Provider:  [name], MD Primary Cardiologist:  Thurmon Fair, MD  Chief Complaint / Patient Profile   77 y.o. y/o female with a h/o HOCM s/p septal myectomy in 2005, hypertension, hyperlipidemia, near syncope s/p loop recorder explanted in 2017, GERD, hypothyroidism, palpitations, LBBB, meningioma, and prediabetes who is pending EGD/colonoscopy on 10/31/23 and presents today for telephonic preoperative cardiovascular risk assessment.  History of Present Illness    Autumn Fitzgerald is a 77 y.o. female who presents via audio/video conferencing for a telehealth visit today.  Pt was last seen in cardiology clinic on 11/22/2022 by Dr. [name].  At that time Autumn Fitzgerald was doing well.  The patient is now pending procedure as outlined above. Since her last visit, she reports shortness of breath with moderate activity because she has to breath through her mouth. She denies chest pain, lower extremity edema, fatigue, palpitations, melena, hematuria, hemoptysis, diaphoresis, weakness, presyncope, syncope, orthopnea, and PND. She is active around her home with house work  and going up and down stairs. She admits she does these activities at a slow pace because of plantar fascitis and shortness of breath. 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)    Hypothyroidism    followed by pcp   LBBB (left bundle branch block)    Mild persistent allergic asthma    Palpitations    Panic attacks    Pneumonia    PONV (postoperative nausea and vomiting)    Pre-diabetes    Wears glasses    Past Surgical History:  Procedure Laterality Date   CARDIAC SURGERY  02/05/2007   in [place]   Septal Myectomy  for HOCM   CATARACT EXTRACTION W/ INTRAOCULAR LENS  IMPLANT, BILATERAL  right 07-23-2017;  left 10-15-2017   COLONOSCOPY N/A 11/24/2013   Procedure: COLONOSCOPY;  Surgeon: Charna Elizabeth, MD;  Location: [facility];  Service: Endoscopy;  Laterality: N/A;   DOPPLER ECHOCARDIOGRAPHY  03/14/2013   EF 65-70%,trivial AI,mild-mod. MR,Mod. TR,mod. concentric hypertrophy  EP IMPLANTABLE DEVICE N/A 08/22/2016   Procedure: Loop Recorder Removal;  Surgeon: Thurmon Fair, MD;  [facility];  Service: Cardiovascular;  Laterality: N/A;   ESOPHAGOGASTRODUODENOSCOPY N/A 11/24/2013   Procedure: ESOPHAGOGASTRODUODENOSCOPY (EGD);  Surgeon: Charna Elizabeth, MD;  Location: [facility];  Service: Endoscopy;  Laterality: N/A;   GLAUCOMA SURGERY Bilateral 2019   "release pressure   KNEE ARTHROSCOPY Right 10/17/2013   Procedure:  ARTHROSCOPY RIGHT KNEE;  Surgeon: Verlee Rossetti, MD;  Location: [facility] OR;  Service: Orthopedics;  Laterality: Right;   KNEE ARTHROSCOPY Left 11/08/2022   Procedure: ARTHROSCOPY KNEE; MEDIAL AND LATERAL MENISECTOMIES, CHONDROPLASTY;  Surgeon: Beverely Low, MD;  Location: [facility];  Service: Orthopedics;  Laterality: Left;  60 min   LAPAROSCOPIC CHOLECYSTECTOMY  02-03-2010   dr d blackman  @[facility]    LOOP RECORDER IMPLANT  03/18/2013   LOOP RECORDER IMPLANT N/A 03/18/2013   Procedure: LOOP RECORDER IMPLANT;  Surgeon: Thurmon Fair, MD;  [facility]: [facility];  Service: Cardiovascular;  Laterality: N/A;   SHOULDER ARTHROSCOPY WITH ROTATOR CUFF REPAIR Right 01/16/2019   Procedure: Right shoulder arthroscopy with subacromial decompression, distal clavicle resection, rotator cuff repair, bicep tenotomy;  Surgeon: Francena Hanly, MD;  Location: [facility];  Service: Orthopedics;  Laterality: Right;   TUBAL LIGATION  yrs ago    Allergies  Allergies  Allergen Reactions   Amoxicillin-Pot Clavulanate Other (See Comments)    Severe pains and GI symptoms Did it involve swelling of the face/tongue/throat, SOB, or low BP? No Did it involve sudden or severe rash/hives, skin peeling, or any reaction on the inside of your mouth or nose? No Did you need to seek medical attention at a hospital or doctor's office? No When did it last happen?      within the past 10 years If all above answers are "NO", may proceed with cephalosporin use.    Temazepam Other (See Comments)    Dizziness and doesn't work for patient   Lipitor [Atorvastatin Calcium]     Muscle pain   Amoxicillin Nausea And Vomiting   Lansoprazole Palpitations    "Body felt sick"    Penicillin G Other (See Comments)    Unknown reaction     Home Medications    Prior to Admission medications   Medication Sig Start Date End Date Taking? Authorizing Provider  albuterol (VENTOLIN HFA) 108 (90 Base) MCG/ACT inhaler INHALE 2 PUFFS INTO THE LUNGS EVERY 4 HOURS  AS NEEDED FOR WHEEZING OR SHORTNESS OF BREATH 09/28/23   Kozlow, Alvira Philips, MD  atenolol (TENORMIN) 25 MG tablet Take 0.5 tablets (12.5 mg total) by mouth daily. 12/03/19   Croitoru, Mihai, MD  baclofen 5 MG TABS Take 1 tablet (5 mg total) by mouth 2 (two) times daily as needed for muscle spasms. 08/03/23   Raspet, Noberto Retort, PA-C  Boswellia-Glucosamine-Vit D (OSTEO BI-FLEX ONE PER DAY PO) Take 1 tablet by mouth.    [provider]  carboxymethylcellul-glycerin (REFRESH RELIEVA) 0.5-0.9 % ophthalmic solution Place 1 drop into both eyes daily. 02/27/23   Kozlow, Alvira Philips, MD  Cholecalciferol 50 MCG (2000 UT) TABS Take 2,000 Units by mouth daily.    [provider]  clobetasol ointment (TEMOVATE) 0.05 % Apply 1 Application topically 3 (three) times a week. 10/01/23   Constant, Peggy, MD  clonazePAM (KLONOPIN) 2 MG tablet Take 2 mg by mouth daily as needed. 11/15/22   [provider]  ezetimibe-simvastatin (VYTORIN) 10-40 MG per tablet Take 1 tablet by mouth every  evening.     [provider]  FLOVENT HFA 44 MCG/ACT inhaler Inhale 2 puffs into the lungs daily. INHALE 2 PUFFS INTO THE LUNGS DAILY WITH SPACER. 07/18/22   Kozlow, Alvira Philips, MD  fluticasone (FLONASE) 50 MCG/ACT nasal spray Place 1 spray into both nostrils daily. 08/28/23   Kozlow, Alvira Philips, MD  Fluticasone Furoate (ARNUITY ELLIPTA) 100 MCG/ACT AEPB Inhale 1 puff into the lungs daily. 08/28/23   Kozlow, Alvira Philips, MD  levothyroxine (SYNTHROID) 50 MCG tablet Take 50 mcg by mouth daily.  06/15/14   [provider]  lidocaine (LIDODERM) 5 % Place 1 patch onto the skin daily. Remove & Discard patch within 12 hours or as directed by MD 08/03/23   Raspet, Noberto Retort, PA-C  Multiple Vitamin (MULTIVITAMIN) tablet Take 1 tablet by mouth daily.    [provider]  pantoprazole (PROTONIX) 40 MG tablet Take 1 tablet (40 mg total) by mouth in the morning. 08/28/23   Kozlow, Alvira Philips, MD  Polyethyl Glycol-Propyl Glycol 0.4-0.3 % SOLN  Place one drop in both eyes daily as needed 01/04/21   Kozlow, Alvira Philips, MD  Propylene Glycol (SYSTANE COMPLETE) 0.6 % SOLN Apply 1 drop to eye 3 (three) times daily as needed. 02/27/23   Kozlow, Alvira Philips, MD  torsemide (DEMADEX) 5 MG tablet Take 5 mg by mouth daily.    [provider]    Physical Exam    Vital Signs:  Autumn Fitzgerald does not have vital signs available for review today.  Given telephonic nature of communication, physical exam is limited. AAOx3. NAD. Normal affect.  Speech and respirations are unlabored.  Accessory Clinical Findings    None  Assessment & Plan    1.  Preoperative Cardiovascular Risk Assessment: According to the Revised Cardiac Risk Index (RCRI), her Perioperative Risk of Major Cardiac Event is (%): 0.9. Her Functional Capacity in METs is: 4.64 according to the Duke Activity Status Index (DASI). The patient is doing well from a cardiac perspective. Therefore, based on ACC/AHA guidelines, the patient would be at acceptable risk for the planned procedure without further cardiovascular testing. I will forward clearance to requesting provider.   The patient was advised that if she develops new symptoms prior to surgery to contact our office to arrange for a follow-up visit, and she verbalized understanding.  No request to hold cardiac medications.  A copy of this note will be routed to requesting surgeon.  Time:   Today, I have spent 10 minutes with the patient with telehealth technology discussing medical history, symptoms, and management plan.    Levi Aland, NP-C  10/04/2023, 10:06 AM [address] Office [phone] Fax [phone]

## 2023-10-11 NOTE — Telephone Encounter (Signed)
Spoke with patient and she knows she is clear to have the procedure and voiced understanding and has her prep

## 2023-10-11 NOTE — Addendum Note (Signed)
Addended by: Alberteen Sam E on: 10/11/2023 03:47 PM   Modules accepted: Orders

## 2023-10-11 NOTE — Telephone Encounter (Signed)
LVM for patient to call back. ?

## 2023-10-19 ENCOUNTER — Encounter: Payer: Self-pay | Admitting: Gastroenterology

## 2023-10-28 ENCOUNTER — Encounter: Payer: Self-pay | Admitting: Certified Registered Nurse Anesthetist

## 2023-10-31 ENCOUNTER — Ambulatory Visit: Payer: Medicare HMO | Admitting: Gastroenterology

## 2023-10-31 ENCOUNTER — Encounter: Payer: Self-pay | Admitting: Gastroenterology

## 2023-10-31 VITALS — BP 108/84 | HR 75 | Temp 97.8°F | Resp 12 | Ht 61.0 in | Wt 137.0 lb

## 2023-10-31 DIAGNOSIS — D125 Benign neoplasm of sigmoid colon: Secondary | ICD-10-CM

## 2023-10-31 DIAGNOSIS — R1013 Epigastric pain: Secondary | ICD-10-CM

## 2023-10-31 DIAGNOSIS — Z1211 Encounter for screening for malignant neoplasm of colon: Secondary | ICD-10-CM

## 2023-10-31 DIAGNOSIS — K635 Polyp of colon: Secondary | ICD-10-CM | POA: Diagnosis not present

## 2023-10-31 DIAGNOSIS — K317 Polyp of stomach and duodenum: Secondary | ICD-10-CM | POA: Diagnosis not present

## 2023-10-31 DIAGNOSIS — K640 First degree hemorrhoids: Secondary | ICD-10-CM | POA: Diagnosis not present

## 2023-10-31 DIAGNOSIS — K2950 Unspecified chronic gastritis without bleeding: Secondary | ICD-10-CM | POA: Diagnosis not present

## 2023-10-31 DIAGNOSIS — Z860100 Personal history of colon polyps, unspecified: Secondary | ICD-10-CM | POA: Diagnosis not present

## 2023-10-31 MED ORDER — SODIUM CHLORIDE 0.9 % IV SOLN: 500.0000 mL | Freq: Once | INTRAVENOUS | Status: DC

## 2023-10-31 NOTE — Op Note (Signed)
[facility] Patient Name: Autumn Fitzgerald Procedure Date: 10/31/2023 1:36 PM MRN: [id number] Endoscopist: Lynann Bologna , MD, [id number] Age: 77 Referring MD:  Date of Birth: June 12, 1946 Gender: Female Account #: [id number] Procedure:                Colonoscopy Indications:              High risk colon cancer surveillance: Personal                            history of colonic polyps Medicines:                Monitored Anesthesia Care Procedure:                Pre-Anesthesia Assessment:                           - Prior to the procedure, a History and Physical                            was performed, and patient medications and                            allergies were reviewed. The patient's tolerance of                            previous anesthesia was also reviewed. The risks                            and benefits of the procedure and the sedation                            options and risks were discussed with the patient.                            All questions were answered, and informed consent                            was obtained. Prior Anticoagulants: The patient has                            taken no anticoagulant or antiplatelet agents. ASA                            Grade Assessment: II - A patient with mild systemic                            disease. After reviewing the risks and benefits,                            the patient was deemed in satisfactory condition to                            undergo the procedure.  After obtaining informed consent, the colonoscope                            was passed under direct vision. Throughout the                            procedure, the patient's blood pressure, pulse, and                            oxygen saturations were monitored continuously. The                            Olympus Scope [id number] was introduced through the                            anus and advanced to the 1 cm into  the ileum. The                            colonoscopy was performed without difficulty. The                            patient tolerated the procedure well. The quality                            of the bowel preparation was good. The terminal                            ileum, ileocecal valve, appendiceal orifice, and                            rectum were photographed. Scope In: 1:51:33 PM Scope Out: 2:04:07 PM Scope Withdrawal Time: 0 hours 9 minutes 55 seconds  Total Procedure Duration: 0 hours 12 minutes 34 seconds  Findings:                 A 4 mm polyp was found in the distal sigmoid colon.                            The polyp was sessile. The polyp was removed with a                            cold snare. Resection and retrieval were complete.                           Non-bleeding internal hemorrhoids were found during                            retroflexion. The hemorrhoids were small and Grade                            I (internal hemorrhoids that do not prolapse).                           The terminal  ileum appeared normal.                           The exam was otherwise without abnormality on                            direct and retroflexion views. Complications:            No immediate complications. Estimated Blood Loss:     Estimated blood loss: none. Impression:               - One 4 mm polyp in the distal sigmoid colon,                            removed with a cold snare. Resected and retrieved.                           - Non-bleeding internal hemorrhoids.                           - The examined portion of the ileum was normal.                           - The examination was otherwise normal on direct                            and retroflexion views. Recommendation:           - Patient has a contact number available for                            emergencies. The signs and symptoms of potential                            delayed complications were discussed with  the                            patient. Return to normal activities tomorrow.                            Written discharge instructions were provided to the                            patient.                           - Resume previous diet.                           - Continue present medications including MiraLAX 17                            g p.o. daily.                           - Await pathology results.                           -  Repeat colonoscopy is not recommended for                            screening purposes.                           - Return to GI clinic PRN. Lynann Bologna, MD 10/31/2023 2:09:40 PM This report has been signed electronically.

## 2023-10-31 NOTE — Progress Notes (Signed)
Called to room to assist during endoscopic procedure.  Patient ID and intended procedure confirmed with present staff. Received instructions for my participation in the procedure from the performing physician.  

## 2023-10-31 NOTE — Patient Instructions (Addendum)
Handouts Provided:  Polyps   Continue present medications including Miralax 17 g by mouth daily.  Repeat Colonoscopy is not recommended for screening purposes.   YOU HAD AN ENDOSCOPIC PROCEDURE TODAY AT THE [facility]:   Refer to the procedure report that was given to you for any specific questions about what was found during the examination.  If the procedure report does not answer your questions, please call your gastroenterologist to clarify.  If you requested that your care partner not be given the details of your procedure findings, then the procedure report has been included in a sealed envelope for you to review at your convenience later.  YOU SHOULD EXPECT: Some feelings of bloating in the abdomen. Passage of more gas than usual.  Walking can help get rid of the air that was put into your GI tract during the procedure and reduce the bloating. If you had a lower endoscopy (such as a colonoscopy or flexible sigmoidoscopy) you may notice spotting of blood in your stool or on the toilet paper. If you underwent a bowel prep for your procedure, you may not have a normal bowel movement for a few days.  Please Note:  You might notice some irritation and congestion in your nose or some drainage.  This is from the oxygen used during your procedure.  There is no need for concern and it should clear up in a day or so.  SYMPTOMS TO REPORT IMMEDIATELY:  Following lower endoscopy (colonoscopy or flexible sigmoidoscopy):  Excessive amounts of blood in the stool  Significant tenderness or worsening of abdominal pains  Swelling of the abdomen that is new, acute  Fever of 100F or higher  Following upper endoscopy (EGD)  Vomiting of blood or coffee ground material  New chest pain or pain under the shoulder blades  Painful or persistently difficult swallowing  New shortness of breath  Fever of 100F or higher  Black, tarry-looking stools  For urgent or emergent issues, a  gastroenterologist can be reached at any hour by calling [phone]. Do not use MyChart messaging for urgent concerns.    DIET:  We do recommend a small meal at first, but then you may proceed to your regular diet.  Drink plenty of fluids but you should avoid alcoholic beverages for 24 hours.  ACTIVITY:  You should plan to take it easy for the rest of today and you should NOT DRIVE or use heavy machinery until tomorrow (because of the sedation medicines used during the test).    FOLLOW UP: Our staff will call the number listed on your records the next business day following your procedure.  We will call around 7:15- 8:00 am to check on you and address any questions or concerns that you may have regarding the information given to you following your procedure. If we do not reach you, we will leave a message.     If any biopsies were taken you will be contacted by phone or by letter within the next 1-3 weeks.  Please call us at [phone] if you have not heard about the biopsies in 3 weeks.    SIGNATURES/CONFIDENTIALITY: You and/or your care partner have signed paperwork which will be entered into your electronic medical record.  These signatures attest to the fact that that the information above on your After Visit Summary has been reviewed and is understood.  Full responsibility of the confidentiality of this discharge information lies with you and/or your care-partner.

## 2023-10-31 NOTE — Op Note (Signed)
[facility] Patient Name: Autumn Fitzgerald Procedure Date: 10/31/2023 1:36 PM MRN: [id number] Endoscopist: Lynann Bologna , MD, [id number] Age: 77 Referring MD:  Date of Birth: 06/12/1946 Gender: Female Account #: [id number] Procedure:                Upper GI endoscopy Indications:              Epigastric abdominal pain Medicines:                Monitored Anesthesia Care Procedure:                Pre-Anesthesia Assessment:                           - Prior to the procedure, a History and Physical                            was performed, and patient medications and                            allergies were reviewed. The patient's tolerance of                            previous anesthesia was also reviewed. The risks                            and benefits of the procedure and the sedation                            options and risks were discussed with the patient.                            All questions were answered, and informed consent                            was obtained. Prior Anticoagulants: The patient has                            taken no anticoagulant or antiplatelet agents. ASA                            Grade Assessment: III - A patient with severe                            systemic disease. After reviewing the risks and                            benefits, the patient was deemed in satisfactory                            condition to undergo the procedure.                           After obtaining informed consent, the endoscope was  passed under direct vision. Throughout the                            procedure, the patient's blood pressure, pulse, and                            oxygen saturations were monitored continuously. The                            Olympus scope [id number] was introduced through the                            mouth, and advanced to the second part of duodenum.                            The upper GI endoscopy  was accomplished without                            difficulty. The patient tolerated the procedure                            well. Scope In: Scope Out: Findings:                 The examined esophagus was normal.                           The Z-line was regular and was found 35 cm from the                            incisors.                           Multiple 6 to 12 mm semi-sessile polyps with no                            bleeding and no stigmata of recent bleeding were                            found in the gastric fundus and in the gastric                            body. Three polyps were removed with a cold snare.                            Resection and retrieval were complete. Few biopsies                            were obtained from the antrum to rule out H. pylori.                           The examined duodenum was normal. No outlet  obstruction. Complications:            No immediate complications. Estimated Blood Loss:     Estimated blood loss: none. Impression:               - Normal esophagus.                           - Z-line regular, 35 cm from the incisors.                           - Multiple gastric polyps. Resected and retrieved x                            3.                           - Normal examined duodenum. Recommendation:           - Patient has a contact number available for                            emergencies. The signs and symptoms of potential                            delayed complications were discussed with the                            patient. Return to normal activities tomorrow.                            Written discharge instructions were provided to the                            patient.                           - Resume previous diet.                           - Continue present medications.                           - Await pathology results.                           - The findings and recommendations  were discussed                            with the patient's family. Lynann Bologna, MD 10/31/2023 2:06:16 PM This report has been signed electronically.

## 2023-10-31 NOTE — Progress Notes (Signed)
Report given to PACU, vss 

## 2023-11-01 ENCOUNTER — Telehealth: Payer: Self-pay

## 2023-11-01 NOTE — Telephone Encounter (Signed)
Follow up call to pt, lm for pt to call if having any difficulty with normal activities or eating and drinking.  Also to call if any other questions or concerns.  

## 2023-11-05 LAB — SURGICAL PATHOLOGY

## 2023-11-05 NOTE — Telephone Encounter (Signed)
Pt requesting sooner appointment for abdominal pain. Pt recently had EGD and Colonoscopy: Pt was scheduled to see Dr. Chales Abrahams on 12/19/2023 at 1:30 PM. Pt made aware. Pt verbalized understanding with all questions answered.

## 2023-11-05 NOTE — Telephone Encounter (Signed)
Inbound call from patient, would like to speak to a nurse, she states she would like to discuss a test for IBS. She states she is unable to eat.

## 2023-11-12 ENCOUNTER — Encounter: Payer: Self-pay | Admitting: Gastroenterology

## 2023-11-19 ENCOUNTER — Encounter: Payer: Self-pay | Admitting: Cardiovascular Disease

## 2023-11-19 ENCOUNTER — Ambulatory Visit: Payer: Medicare HMO | Attending: Cardiovascular Disease | Admitting: Cardiovascular Disease

## 2023-11-19 VITALS — BP 122/72 | HR 81 | Ht 61.0 in | Wt 129.8 lb

## 2023-11-19 DIAGNOSIS — I10 Essential (primary) hypertension: Secondary | ICD-10-CM | POA: Diagnosis not present

## 2023-11-19 DIAGNOSIS — I447 Left bundle-branch block, unspecified: Secondary | ICD-10-CM

## 2023-11-19 DIAGNOSIS — I421 Obstructive hypertrophic cardiomyopathy: Secondary | ICD-10-CM | POA: Diagnosis not present

## 2023-11-19 NOTE — Progress Notes (Signed)
Cardiology Office Note:    Date:  11/19/2023   ID:  Autumn Fitzgerald, DOB [birth year], MRN [id number]  PCP:  [name], MD  Cardiologist:  Thurmon Fair, MD  Electrophysiologist:  None   Referring MD: [name], MD   Chief Complaint  Patient presents with   Abdominal Pain     History of Present Illness:    Autumn Fitzgerald is a 77 y.o. female with a hx of HOCM s/p septal myectomy in 2005, GERD, HTN, HLD, history of near syncope s/p loop recorder that was explanted in 2017, hypothyroidism, LBBB and prediabetes, meningioma, incidentally noted aortic atherosclerosis with normal caliber aorta.    Previous loop recorder did not show any meaningful arrhythmia. Patient had a normal Myoview in January 2020 as part of the cardiac clearance prior to surgery.   Echocardiogram obtained recently on 07/06/2022 still shows evidence of hypertrophic cardiomyopathy with previous septal myectomy and a peak gradient of 28 mmHg following the Valsalva maneuver (on my review this is a midcavity gradient, not true LV outflow obstruction), grade 1 diastolic dysfunction, no evidence of elevated mean left atrial pressure and normal pulmonary artery pressure.   Biggest complaint is abdominal discomfort especially after she eats.  She has temporary improvement with Gas-X or antacids.  She had an upper endoscopy that showed numerous gastric polyps which were benign at biopsy.  H. pylori was negative.  She had a negative colonoscopy.  She finds it hard to eat more than a tiny amounts of food at a time and she is lost weight as a consequence.  She tells me that she has been diagnosed as having irritable bowel syndrome.  She has a follow-up visit scheduled with her gastroenterologist Dr. Chales Abrahams in January.  She is rarely troubled by palpitations.  She is still not taking her atenolol on a regular basis because she is afraid it will drop her blood pressure too much.  NYHA functional class II exertional dyspnea is  unchanged, does not have edema, orthopnea, PND or chest pain.  No change in her chronic complaints of shortness of breath with activity.  Denies edema, orthopnea, PND.  Has not had syncope.  Most recent lipid profile showed an LDL cholesterol of 65, most recent hemoglobin A1c 6.1%.  Creatinine 1.06 and potassium 4.7, hemoglobin 14.2, normal TSH and LFTs.  She is generally very sensitive to low doses of meds (including beta blockers and diuretics). Although she could not tolerate a full 25 mg daily dose of atenolol due to dizziness and weakness, she feels better on 12.5 mg of atenolol.  It helps prevent palpitations and limits episodes of chest pain.  Had dyspnea off diuretics, but taking torsemide more than 5 mg daily causes weakness.  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)    Hypothyroidism    followed by pcp   LBBB (left bundle branch block)    Mild persistent allergic asthma    Palpitations  Panic attacks    Pneumonia    PONV (postoperative nausea and vomiting)    Pre-diabetes    Tricuspid regurgitation 07/06/2022   severe   Wears glasses     Past Surgical History:  Procedure Laterality Date   CARDIAC SURGERY  02/05/2007   in [place]   Septal Myectomy  for HOCM   CATARACT EXTRACTION W/ INTRAOCULAR LENS  IMPLANT, BILATERAL  right 07-23-2017;  left 10-15-2017   COLONOSCOPY N/A 11/24/2013   Procedure: COLONOSCOPY;  Surgeon: Charna Elizabeth, MD;  Location: [facility];  Service: Endoscopy;  Laterality: N/A;   DOPPLER ECHOCARDIOGRAPHY  03/14/2013    EF 65-70%,trivial AI,mild-mod. MR,Mod. TR,mod. concentric hypertrophy   EP IMPLANTABLE DEVICE N/A 08/22/2016   Procedure: Loop Recorder Removal;  Surgeon: Thurmon Fair, MD;  Location: [facility];  Service: Cardiovascular;  Laterality: N/A;   ESOPHAGOGASTRODUODENOSCOPY N/A 11/24/2013   Procedure: ESOPHAGOGASTRODUODENOSCOPY (EGD);  Surgeon: Charna Elizabeth, MD;  Location: [facility];  Service: Endoscopy;  Laterality: N/A;   GLAUCOMA SURGERY Bilateral 2019   "release pressure   KNEE ARTHROSCOPY Right 10/17/2013   Procedure: ARTHROSCOPY RIGHT KNEE;  Surgeon: Verlee Rossetti, MD;  Location: [facility] OR;  Service: Orthopedics;  Laterality: Right;   KNEE ARTHROSCOPY Left 11/08/2022   Procedure: ARTHROSCOPY KNEE; MEDIAL AND LATERAL MENISECTOMIES, CHONDROPLASTY;  Surgeon: Beverely Low, MD;  Location: [facility];  Service: Orthopedics;  Laterality: Left;  60 min   LAPAROSCOPIC CHOLECYSTECTOMY  02-03-2010   dr d blackman  @[facility]    LOOP RECORDER IMPLANT  03/18/2013   LOOP RECORDER IMPLANT N/A 03/18/2013   Procedure: LOOP RECORDER IMPLANT;  Surgeon: Thurmon Fair, MD;  Location: [facility];  Service: Cardiovascular;  Laterality: N/A;   SHOULDER ARTHROSCOPY WITH ROTATOR CUFF REPAIR Right 01/16/2019   Procedure: Right shoulder arthroscopy with subacromial decompression, distal clavicle resection, rotator cuff repair, bicep tenotomy;  Surgeon: Francena Hanly, MD;  Location: [facility];  Service: Orthopedics;  Laterality: Right;   TUBAL LIGATION  yrs ago    Current Medications: Current Meds  Medication Sig   dicyclomine (BENTYL) 20 MG tablet Take 20 mg by mouth daily at 6 (six) AM.     Allergies:   Amoxicillin-pot clavulanate, Temazepam, Lipitor [atorvastatin calcium], Amoxicillin, Lansoprazole, and Penicillin g   Social History   Socioeconomic History   Marital status: Divorced    Spouse name: Not on file   Number of children: 5   Years of education: 10   Highest education level: Not on file  Occupational  History   Occupation: retired  Tobacco Use   Smoking status: Never   Smokeless tobacco: Never  Vaping Use   Vaping status: Never Used  Substance and Sexual Activity   Alcohol use: No   Drug use: No   Sexual activity: Never    Birth control/protection: Post-menopausal, Surgical    Comment: divorced  Other Topics Concern   Not on file  Social History Narrative   Lives at home by herself.    Caffeine use: none    Social Determinants of Corporate investment banker Strain: Not on file  Food Insecurity: Not on file  Transportation Needs: Not on file  Physical Activity: Not on file  Stress: Not on file  Social Connections: Not on file     Family History: The patient's family history includes Breast cancer in her mother; Cancer in her brother, father, and mother; Diabetes in her sister; Heart attack in her sister; Hypertension in her mother. There is no history of  Liver disease, Colon cancer, Esophageal cancer, Stomach cancer, or Rectal cancer.  ROS:   Please see the history of present illness.    All other systems are reviewed and are negative.   EKGs/Labs/Other Studies Reviewed:    The following studies were reviewed today:  Myoview 12/24/2018 The left ventricular ejection fraction is hyperdynamic (>65%). Nuclear stress EF: 80%. There was no ST segment deviation noted during stress. The study is normal. This is a low risk study.   Normal pharmacologic nuclear stress test with no evidence for prior infarct or ischemia. Normal LVEF.   Echo 07/06/2022   1. Hyperdynamic contraction, HOCM post septal myomectomy - peak velocity  2.7 m/s, PG 28 mmHg with Valsalva. Left ventricular ejection fraction, by  estimation, is >75%. The left ventricle has hyperdynamic function. The  left ventricle has no regional wall   motion abnormalities. There is mild left ventricular hypertrophy. Left  ventricular diastolic parameters are consistent with Grade I diastolic  dysfunction  (impaired relaxation). The average left ventricular global  longitudinal strain is -19.7 %. The  global longitudinal strain is normal.   2. Right ventricular systolic function is normal. The right ventricular  size is normal. There is normal pulmonary artery systolic pressure. The  estimated right ventricular systolic pressure is 24.7 mmHg.   3. No SAM. The mitral valve is normal in structure. Mild mitral valve  regurgitation. No evidence of mitral stenosis.   4. Tricuspid valve regurgitation is severe.   5. The aortic valve is normal in structure. Aortic valve regurgitation is  mild. No aortic stenosis is present.   6. Pulmonic valve regurgitation is moderate.   7. The inferior vena cava is normal in size with greater than 50%  respiratory variability, suggesting right atrial pressure of 3 mmHg.   EKG:  EKG is ordered today.  ECG is unchanged, NSR and LBBB.  Recent Labs: 08/31/2023: ALT 21; BUN 10; Creatinine, Ser 0.84; Hemoglobin 14.0; Platelets 256; Potassium 3.7; Sodium 141  Recent Lipid Panel    Component Value Date/Time   CHOL 190 01/03/2021 0231   TRIG 305 (H) 01/03/2021 0231   HDL 52 01/03/2021 0231   CHOLHDL 3.7 01/03/2021 0231   VLDL 19 09/23/2008 2026   LDLCALC 97 01/03/2021 0231    Physical Exam:    VS:  BP 122/72 (BP Location: Left Arm, Patient Position: Sitting, Cuff Size: Normal)   Pulse 81   Ht 5\' 1"  (1.549 m)   Wt 129 lb 12.8 oz (58.9 kg)   SpO2 99%   BMI 24.53 kg/m     Wt Readings from Last 3 Encounters:  11/19/23 129 lb 12.8 oz (58.9 kg)  10/31/23 137 lb (62.1 kg)  10/01/23 134 lb (60.8 kg)     General: Alert, oriented x3, no distress, lean. Head: no evidence of trauma, PERRL, EOMI, no exophtalmos or lid lag, no myxedema, no xanthelasma; normal ears, nose and oropharynx Neck: normal jugular venous pulsations and no hepatojugular reflux; brisk carotid pulses without delay and no carotid bruits Chest: clear to auscultation, no signs of consolidation by  percussion or palpation, normal fremitus, symmetrical and full respiratory excursions Cardiovascular: normal position and quality of the apical impulse, regular rhythm, normal first and paradoxically split second heart sounds, no murmurs enthesis even with the Valsalva maneuver), rubs or gallops Abdomen: no tenderness or distention, no masses by palpation, no abnormal pulsatility or arterial bruits, normal bowel sounds, no hepatosplenomegaly Extremities: no clubbing, cyanosis or edema; 2+ radial, ulnar and brachial pulses  bilaterally; 2+ right femoral, posterior tibial and dorsalis pedis pulses; 2+ left femoral, posterior tibial and dorsalis pedis pulses; no subclavian or femoral bruits Neurological: grossly nonfocal Psych: Normal mood and affect    ASSESSMENT:    1. Hypertension, unspecified type   2. Hypertrophic obstructive cardiomyopathy (HCC)   3. LBBB (left bundle branch block)       PLAN:    In order of problems listed above:  HOCM s/p septal myectomy: She does not have true LV outflow tract obstruction.  There is no evidence of this on physical exam or by the echo (her gradient is mid cavitary).  She is not currently complaining of any chest discomfort and had no evidence of ischemia on recent nuclear stress testing.  Never had documentation of malignant ventricular arrhythmia, including during 3 years of loop recorder monitoring.  Explained the rationale for beta-blocker therapy in hypertrophic cardiomyopathy to prevent heart failure, arrhythmia, worsening symptoms during adrenaline surges such as emotions/pain/anesthesia and surgery.  Should not interrupt her atenolol.   LBBB: Longstanding conduction abnormality.  Relatively narrow QRS. Hypertension: controlled.  Prefer beta-blockers. Hyperlipidemia: Labs monitored by PCP. Hypothyroidism: Appears clinically euthyroid.  Most recent TSH was in normal range.    Medication Adjustments/Labs and Tests Ordered: Current medicines are  reviewed at length with the patient today.  Concerns regarding medicines are outlined above.  Orders Placed This Encounter  Procedures   EKG 12-Lead    No orders of the defined types were placed in this encounter.    Patient Instructions  Medication Instructions:  No changes *If you need a refill on your cardiac medications before your next appointment, please call your pharmacy*  Follow-Up: At [facility], you and your health needs are our priority.  As part of our continuing mission to provide you with exceptional heart care, we have created designated Provider Care Teams.  These Care Teams include your primary Cardiologist (physician) and Advanced Practice Providers (APPs -  Physician Assistants and Nurse Practitioners) who all work together to provide you with the care you need, when you need it.  We recommend signing up for the patient portal called "MyChart".  Sign up information is provided on this After Visit Summary.  MyChart is used to connect with patients for Virtual Visits (Telemedicine).  Patients are able to view lab/test results, encounter notes, upcoming appointments, etc.  Non-urgent messages can be sent to your provider as well.   To learn more about what you can do with MyChart, go to [email or web address].    Your next appointment:   1 year(s)  Provider:   Thurmon Fair, MD       Signed, Thurmon Fair, MD  11/19/2023 4:14 PM    [facility]

## 2023-11-19 NOTE — Patient Instructions (Signed)

## 2023-11-28 ENCOUNTER — Ambulatory Visit: Payer: Medicare HMO | Admitting: Gastroenterology

## 2023-11-29 ENCOUNTER — Ambulatory Visit: Payer: Medicare HMO | Admitting: Gastroenterology

## 2023-11-30 ENCOUNTER — Other Ambulatory Visit: Payer: Self-pay | Admitting: Allergy and Immunology

## 2023-12-19 ENCOUNTER — Other Ambulatory Visit: Payer: Medicare HMO

## 2023-12-19 ENCOUNTER — Ambulatory Visit: Payer: Medicare HMO | Admitting: Gastroenterology

## 2023-12-19 ENCOUNTER — Encounter: Payer: Self-pay | Admitting: Gastroenterology

## 2023-12-19 VITALS — BP 118/70 | HR 97 | Ht 61.0 in | Wt 128.0 lb

## 2023-12-19 DIAGNOSIS — R140 Abdominal distension (gaseous): Secondary | ICD-10-CM

## 2023-12-19 DIAGNOSIS — R634 Abnormal weight loss: Secondary | ICD-10-CM | POA: Diagnosis not present

## 2023-12-19 DIAGNOSIS — Z860100 Personal history of colon polyps, unspecified: Secondary | ICD-10-CM

## 2023-12-19 DIAGNOSIS — R1013 Epigastric pain: Secondary | ICD-10-CM | POA: Diagnosis not present

## 2023-12-19 DIAGNOSIS — R933 Abnormal findings on diagnostic imaging of other parts of digestive tract: Secondary | ICD-10-CM

## 2023-12-19 DIAGNOSIS — K581 Irritable bowel syndrome with constipation: Secondary | ICD-10-CM

## 2023-12-19 DIAGNOSIS — R9389 Abnormal findings on diagnostic imaging of other specified body structures: Secondary | ICD-10-CM

## 2023-12-19 LAB — CBC WITH DIFFERENTIAL/PLATELET
Basophils Absolute: 0.1 10*3/uL (ref 0.0–0.1)
Basophils Relative: 0.9 % (ref 0.0–3.0)
Eosinophils Absolute: 0.2 10*3/uL (ref 0.0–0.7)
Eosinophils Relative: 2.4 % (ref 0.0–5.0)
HCT: 42.4 % (ref 36.0–46.0)
Hemoglobin: 13.9 g/dL (ref 12.0–15.0)
Lymphocytes Relative: 35.7 % (ref 12.0–46.0)
Lymphs Abs: 2.5 10*3/uL (ref 0.7–4.0)
MCHC: 32.8 g/dL (ref 30.0–36.0)
MCV: 88.5 fL (ref 78.0–100.0)
Monocytes Absolute: 0.6 10*3/uL (ref 0.1–1.0)
Monocytes Relative: 9 % (ref 3.0–12.0)
Neutro Abs: 3.6 10*3/uL (ref 1.4–7.7)
Neutrophils Relative %: 52 % (ref 43.0–77.0)
Platelets: 260 10*3/uL (ref 150.0–400.0)
RBC: 4.79 Mil/uL (ref 3.87–5.11)
RDW: 13.3 % (ref 11.5–15.5)
WBC: 7 10*3/uL (ref 4.0–10.5)

## 2023-12-19 LAB — COMPREHENSIVE METABOLIC PANEL
ALT: 17 U/L (ref 0–35)
AST: 27 U/L (ref 0–37)
Albumin: 4.5 g/dL (ref 3.5–5.2)
Alkaline Phosphatase: 95 U/L (ref 39–117)
BUN: 8 mg/dL (ref 6–23)
CO2: 29 meq/L (ref 19–32)
Calcium: 9.1 mg/dL (ref 8.4–10.5)
Chloride: 102 meq/L (ref 96–112)
Creatinine, Ser: 0.78 mg/dL (ref 0.40–1.20)
GFR: 72.52 mL/min (ref >60.00)
Glucose, Bld: 75 mg/dL (ref 70–99)
Potassium: 3.7 meq/L (ref 3.5–5.1)
Sodium: 140 meq/L (ref 135–145)
Total Bilirubin: 1.1 mg/dL (ref 0.2–1.2)
Total Protein: 7.5 g/dL (ref 6.0–8.3)

## 2023-12-19 LAB — LIPASE: Lipase: 20 U/L (ref 11.0–59.0)

## 2023-12-19 LAB — TSH: TSH: 2.67 u[IU]/mL (ref 0.35–5.50)

## 2023-12-19 NOTE — Patient Instructions (Signed)
 _______________________________________________________  If your blood pressure at your visit was 140/90 or greater, please contact your primary care physician to follow up on this.  _______________________________________________________  If you are age 78 or older, your body mass index should be between 23-30. Your Body mass index is 24.19 kg/m. If this is out of the aforementioned range listed, please consider follow up with your Primary Care Provider.  If you are age 70 or younger, your body mass index should be between 19-25. Your Body mass index is 24.19 kg/m. If this is out of the aformentioned range listed, please consider follow up with your Primary Care Provider.   ________________________________________________________  The [facility] providers would like to encourage you to use MYCHART to communicate with providers for non-urgent requests or questions.  Due to long hold times on the telephone, sending your provider a message by [facility] may be a faster and more efficient way to get a response.  Please allow 48 business hours for a response.  Please remember that this is for non-urgent requests.  _______________________________________________________  Your provider has requested that you go to the basement level for lab work before leaving today. Press B on the elevator. The lab is located at the first door on the left as you exit the elevator.  Continue Protonix  daily, bentyl  2 times a day as needed and Miralax 17g daily in 8oz water  You have been scheduled for a CT scan of the abdomen and pelvis at [facility] [place], [place], [place] [address]).   You are scheduled on 12-26-23 at 430pm. You should arrive by 215pm your appointment time for registration. Please follow the written instructions below on the day of your exam:  WARNING: IF YOU ARE ALLERGIC TO IODINE/X-RAY DYE, PLEASE NOTIFY RADIOLOGY IMMEDIATELY AT [phone]! YOU WILL BE GIVEN A 13 HOUR  PREMEDICATION PREP.  1) Do not eat or drink anything after 1230pm (4 hours prior to your test) 2) You will be given 2 bottles of oral contrast to drink on site.    Drink 1 bottle of contrast @ 230pm (2 hours prior to your exam)  Drink 1 bottle of contrast @ 330pm (1 hour prior to your exam)  You may take any medications as prescribed with a small amount of water, if necessary. If you take any of the following medications: METFORMIN, GLUCOPHAGE, GLUCOVANCE, AVANDAMET, RIOMET, FORTAMET, ACTOPLUS MET, JANUMET, GLUMETZA or METAGLIP, you MAY be asked to HOLD this medication 48 hours AFTER the exam.  The purpose of you drinking the oral contrast is to aid in the visualization of your intestinal tract. The contrast solution may cause some diarrhea. Depending on your individual set of symptoms, you may also receive an intravenous injection of x-ray contrast/dye. Plan on being at [facility] for 30 minutes or longer, depending on the type of exam you are having performed.  This test typically takes 30-45 minutes to complete.  If you have any questions regarding your exam or if you need to reschedule, you may call the CT department at [phone] between the hours of 8:00 am and 5:00 pm, Monday-Friday.  Thank you,  Dr. Lynnie Bring

## 2023-12-19 NOTE — Progress Notes (Signed)
 Chief Complaint: FU  Referring Provider:  Shelda Atlas, MD      ASSESSMENT AND PLAN;   #1. Epi pain with abn CT showing gastric wall thickening/wt loss. Neg EGD 10/2023 except for gastric polyps. H/O pyloric stenosis s/p dilatation to 14 mm 2018. Dx with functional dyspepsia. Neg eval for gastroparesis including GES, EGG 2019.  Previous cholecystectomy.  #2. H/O polyps- neg colon Nov 2024  #3. IBS-C with bloating  Plan: -CBC, CMP, lipase, TSH -Rpt CT AP with contrast. -Continue protonix  40mg  po QD -Miralax 17g po every day -Continie bentyl  20mg  po BID prn -I have reassured patient.   HPI:    Autumn Fitzgerald is a 78 y.o. female  hx of HOCM s/p septal myectomy in 2005, GERD, HTN, HLD, history of near syncope s/p loop recorder that was explanted in 2017, hypothyroidism, LBBB and prediabetes, meningioma, Nl EF (2DE-06/2022 >75%), anxiety, s/p cholecystectomy.  With continued epigastric pain that radiates to the back. The pain has been ongoing for four months, during which time they lost five pounds due to decreased appetite. The patient reports that the pain was particularly severe around the time of their recent colonoscopy. They were not aware of their IBS diagnosis until recently and initially attributed the pain to polyps.  The patient has been managing their bowel movements with daily Miralax and reports regular bowel movements. They have also been taking Pantoprazole  and Bentyl  for their stomach pain. They deny any nausea or vomiting.  The patient also has a history of heart disease, specifically hypertrophic obstructive cardiomyopathy, for which they underwent surgery 16 years ago. They report that they were near death at the time of the surgery and attribute their survival to their proactive approach to their health. Despite their heart condition, they report that their heart doctor has said they are doing well.  The patient also reports back pain, but it is unclear  whether this is related to their other symptoms or a separate issue. They express a willingness to undergo further testing, including a CT scan and blood work, to investigate their ongoing stomach and back pain.        Wt Readings from Last 3 Encounters:  12/19/23 128 lb (58.1 kg)  11/19/23 129 lb 12.8 oz (58.9 kg)  10/31/23 137 lb (62.1 kg)     Seen in ED 08/31/2023 With epi pain, bloating. Nl CBC, CMP, lipase CT Abdo/pelvis which showed questionable  gastric wall thickening. advised EGD.  She has been having epi pain x since 2015, worse with eating.  Associated with bloating and early satiety.  She also has intermittent nausea.  No dysphagia.  Has seen several gastroenterologists with extensive GI workup.  -Seen by Dr. Kristie had neg EGD in 2015, normal GES, negative CT Abdo/pelvis, negative hydrogen SIBO breath test 08/2017 -Seen by Dr. Vivi Phillips underwent EGD 11/05/2017 which showed pyloric stenosis, dilated to 14 mm.  Multiple hyperplastic gastric polyps.  She did not have any benefit after pyloric dilatation.  Underwent repeat GES on 01/03/2018 which was normal.  She also had normal EGD on 01/10/2018.  Diagnosed with functional dyspepsia.  Given trial of amitriptyline  25 mg p.o. nightly which she did not tolerate. -She has tried multiple medications including probiotics, PPIs, IBgard, FD guard without any significant relief.   Also with longstanding history of constipation with colonoscopy 07/2018 showing small sessile polyps.  It was recommended to perform repeat colonoscopy in 5 years.  No jaundice dark urine or pale stools.  Past GI WU: EGD 10/31/2023 - Normal esophagus. - Z- line regular, 35 cm from the incisors. - Multiple gastric polyps. Resected and retrieved x 3. - Normal examined duodenum. -Bx: neg for HP, fundic gland polyps  Colonoscopy 10/31/2023 - One 4 mm polyp in the distal sigmoid colon, removed with a cold snare. Resected and retrieved. - Non- bleeding  internal hemorrhoids. - The examined portion of the ileum was normal. -Bx- neg. No need to rpt   CT AP with contrast 08/31/2023 No bowel obstruction, free air or free fluid. Normal appendix. Stomach is underdistended but there is some questionable fold thickening. Please correlate with any particular symptoms. Fatty liver infiltration.  Previous cholecystectomy.  EGD 11/05/2017 (Dr Lonni Phillips): Pyloric stenosis, dil 14 mm. Multiple small gastric polyps  Colon 07/15/2018: (Dr. Lonni Phillips) Small sessile polyps s/p polypectomy.  I could not find biopsy results.  Repeat 5 years.  EGD 11/24/2013: Dr. Seymour polyps s/p polypectomy using hot snare, small HH.  Colonoscopy 11/24/2013: Dr. Levis but normal.  Some stool in the colon.  Internal hemorrhoids.  Per report repeat 10 years.  MRI head with contrast 02/2022 1. Negative for retrocochlear lesion. 2. Mild mastoid opacification on the left more than right. 3. 9 mm high right frontal meningioma without worrisome change from 2016.  Past Medical History:  Diagnosis Date   Anxiety    Arthritis    CHF (congestive heart failure) (HCC)    Depression    Elevated cholesterol    GERD (gastroesophageal reflux disease)    Glaucoma, both eyes    Head injury, closed, without LOC 02/2013   did not LOC per patient   Headache    History of syncope 2014   near syncope,  loop recorder placed and explanted 2017   HLD (hyperlipidemia)    Hypertension    at times     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)    Hypothyroidism    followed by pcp   LBBB (left bundle branch block)    Mild persistent allergic asthma    Palpitations    Panic attacks    Pneumonia    PONV (postoperative nausea and vomiting)    Pre-diabetes    Tricuspid regurgitation 07/06/2022   severe   Wears glasses     Past Surgical History:  Procedure Laterality Date   CARDIAC SURGERY  02/05/2007   in [place]    Septal Myectomy  for HOCM   CATARACT  EXTRACTION W/ INTRAOCULAR LENS  IMPLANT, BILATERAL  right 07-23-2017;  left 10-15-2017   COLONOSCOPY N/A 11/24/2013   Procedure: COLONOSCOPY;  Surgeon: Renaye Sous, MD;  Location: [facility];  Service: Endoscopy;  Laterality: N/A;   DOPPLER ECHOCARDIOGRAPHY  03/14/2013   EF 65-70%,trivial AI,mild-mod. MR,Mod. TR,mod. concentric hypertrophy   EP IMPLANTABLE DEVICE N/A 08/22/2016   Procedure: Loop Recorder Removal;  Surgeon: Jerel Balding, MD;  Location: [facility];  Service: Cardiovascular;  Laterality: N/A;   ESOPHAGOGASTRODUODENOSCOPY N/A 11/24/2013   Procedure: ESOPHAGOGASTRODUODENOSCOPY (EGD);  Surgeon: Renaye Sous, MD;  Location: [facility];  Service: Endoscopy;  Laterality: N/A;   GLAUCOMA SURGERY Bilateral 2019   release pressure   KNEE ARTHROSCOPY Right 10/17/2013   Procedure: ARTHROSCOPY RIGHT KNEE;  Surgeon: Elspeth JONELLE Her, MD;  [facility];  Service: Orthopedics;  Laterality: Right;   KNEE ARTHROSCOPY Left 11/08/2022   Procedure: ARTHROSCOPY KNEE; MEDIAL AND LATERAL MENISECTOMIES, CHONDROPLASTY;  Surgeon: Her Kemps, MD;  Location: [facility] ORS;  Service: Orthopedics;  Laterality: Left;  60 min  LAPAROSCOPIC CHOLECYSTECTOMY  02-03-2010   dr d blackman  @[facility]    LOOP RECORDER IMPLANT  03/18/2013   LOOP RECORDER IMPLANT N/A 03/18/2013   Procedure: LOOP RECORDER IMPLANT;  Surgeon: Jerel Balding, MD;  Location: [facility] CATH LAB;  Service: Cardiovascular;  Laterality: N/A;   SHOULDER ARTHROSCOPY WITH ROTATOR CUFF REPAIR Right 01/16/2019   Procedure: Right shoulder arthroscopy with subacromial decompression, distal clavicle resection, rotator cuff repair, bicep tenotomy;  Surgeon: Melita Drivers, MD;  Location: [facility] ORS;  Service: Orthopedics;  Laterality: Right;   TUBAL LIGATION  yrs ago    Family History  Problem Relation Age of Onset   Cancer Mother        breast   Hypertension Mother    Breast cancer Mother    Cancer Father        prostate   Diabetes Sister    Heart attack  Sister    Cancer Brother    Liver disease Neg Hx    Colon cancer Neg Hx    Esophageal cancer Neg Hx    Stomach cancer Neg Hx    Rectal cancer Neg Hx     Social History   Tobacco Use   Smoking status: Never   Smokeless tobacco: Never  Vaping Use   Vaping status: Never Used  Substance Use Topics   Alcohol use: No   Drug use: No    Current Outpatient Medications  Medication Sig Dispense Refill   acetaminophen  (TYLENOL ) 325 MG tablet Take 650 mg by mouth every 6 (six) hours as needed.     albuterol  (VENTOLIN  HFA) 108 (90 Base) MCG/ACT inhaler INHALE 2 PUFFS BY MOUTH EVERY 4 HOURS AS NEEDED FOR WHEEZING OR SHORTNESS OF BREATH 18 g 1   atenolol  (TENORMIN ) 25 MG tablet Take 0.5 tablets (12.5 mg total) by mouth daily. 30 tablet 3   baclofen  5 MG TABS Take 1 tablet (5 mg total) by mouth 2 (two) times daily as needed for muscle spasms. 14 tablet 0   Boswellia-Glucosamine-Vit D (OSTEO BI-FLEX ONE PER DAY PO) Take 1 tablet by mouth.     Cholecalciferol 50 MCG (2000 UT) TABS Take 2,000 Units by mouth daily.     clobetasol  ointment (TEMOVATE ) 0.05 % Apply 1 Application topically 3 (three) times a week. 60 g 3   clonazePAM (KLONOPIN) 2 MG tablet Take 2 mg by mouth daily as needed.     dicyclomine  (BENTYL ) 20 MG tablet Take 20 mg by mouth daily at 6 (six) AM.     ezetimibe -simvastatin  (VYTORIN ) 10-40 MG per tablet Take 1 tablet by mouth every evening.      FLOVENT  HFA 44 MCG/ACT inhaler Inhale 2 puffs into the lungs daily. INHALE 2 PUFFS INTO THE LUNGS DAILY WITH SPACER. 10.6 g 5   Fluticasone  Furoate (ARNUITY ELLIPTA ) 100 MCG/ACT AEPB Inhale 1 puff into the lungs daily. 30 each 5   levothyroxine  (SYNTHROID ) 50 MCG tablet Take 50 mcg by mouth daily.      Lifitegrast (XIIDRA OP) Apply to eye.     Multiple Vitamin (MULTIVITAMIN) tablet Take 1 tablet by mouth daily.     pantoprazole  (PROTONIX ) 40 MG tablet Take 1 tablet (40 mg total) by mouth in the morning. 90 tablet 1   torsemide (DEMADEX) 5 MG  tablet Take 5 mg by mouth daily.     carboxymethylcellul-glycerin (REFRESH RELIEVA) 0.5-0.9 % ophthalmic solution Place 1 drop into both eyes daily. (Patient not taking: Reported on 12/19/2023) 45 mL 1   fluticasone  (FLONASE ) 50 MCG/ACT  nasal spray Place 1 spray into both nostrils daily. (Patient not taking: Reported on 12/19/2023) 48 g 1   lidocaine  (LIDODERM ) 5 % Place 1 patch onto the skin daily. Remove & Discard patch within 12 hours or as directed by MD (Patient not taking: Reported on 12/19/2023) 30 patch 0   Polyethyl Glycol-Propyl Glycol 0.4-0.3 % SOLN Place one drop in both eyes daily as needed (Patient not taking: Reported on 12/19/2023) 30 mL 2   No current facility-administered medications for this visit.    Allergies  Allergen Reactions   Amoxicillin-Pot Clavulanate Other (See Comments)    Severe pains and GI symptoms Did it involve swelling of the face/tongue/throat, SOB, or low BP? No Did it involve sudden or severe rash/hives, skin peeling, or any reaction on the inside of your mouth or nose? No Did you need to seek medical attention at a hospital or doctor's office? No When did it last happen?      within the past 10 years If all above answers are "NO", may proceed with cephalosporin use.    Temazepam Other (See Comments)    Dizziness and doesn't work for patient   Lipitor [Atorvastatin Calcium]     Muscle pain   Amoxicillin Nausea And Vomiting   Lansoprazole  Palpitations    Body felt sick    Penicillin G Other (See Comments)    Unknown reaction     Review of Systems:  Psychiatric/Behavioral: Has anxiety or depression     Physical Exam:    BP 118/70   Pulse 97   Ht 5' 1 (1.549 m)   Wt 128 lb (58.1 kg)   BMI 24.19 kg/m  Wt Readings from Last 3 Encounters:  12/19/23 128 lb (58.1 kg)  11/19/23 129 lb 12.8 oz (58.9 kg)  10/31/23 137 lb (62.1 kg)   Constitutional:  Well-developed, in no acute distress. Psychiatric: Normal mood and affect. Behavior is  normal. HEENT: Pupils normal.  Conjunctivae are normal. No scleral icterus. Cardiovascular: Normal rate, regular rhythm. No edema Pulmonary/chest: Effort normal and breath sounds normal. No wheezing, rales or rhonchi. Abdominal: Soft, nondistended. Nontender. Bowel sounds active throughout. There are no masses palpable. No hepatomegaly. Rectal: Deferred Neurological: Alert and oriented to person place and time. Skin: Skin is warm and dry. No rashes noted.  Data Reviewed: I have personally reviewed following labs and imaging studies  CBC:    Latest Ref Rng & Units 08/31/2023   10:14 AM 07/29/2022    2:47 PM 04/21/2021    3:13 PM  CBC  WBC 4.0 - 10.5 K/uL 5.2  7.4  6.8   Hemoglobin 12.0 - 15.0 g/dL 85.9  85.7  85.1   Hematocrit 36.0 - 46.0 % 42.3  42.6  45.8   Platelets 150 - 400 K/uL 256  276  295     CMP:    Latest Ref Rng & Units 08/31/2023   10:14 AM 10/27/2022    1:55 PM 07/29/2022    2:47 PM  CMP  Glucose 70 - 99 mg/dL 95  81  869   BUN 8 - 23 mg/dL 10  12  11    Creatinine 0.44 - 1.00 mg/dL 9.15  9.26  9.26   Sodium 135 - 145 mmol/L 141  140  137   Potassium 3.5 - 5.1 mmol/L 3.7  3.8  3.6   Chloride 98 - 111 mmol/L 104  104  102   CO2 22 - 32 mmol/L 27  30  23    Calcium  8.9 - 10.3 mg/dL 9.2  9.0  9.1   Total Protein 6.5 - 8.1 g/dL 7.2     Total Bilirubin 0.3 - 1.2 mg/dL 1.5     Alkaline Phos 38 - 126 U/L 85     AST 15 - 41 U/L 24     ALT 0 - 44 U/L 21         Radiology Studies: No results found.    Anselm Bring, MD 12/19/2023, 1:32 PM  Cc: Shelda Atlas, MD

## 2023-12-26 ENCOUNTER — Ambulatory Visit (HOSPITAL_COMMUNITY)
Admission: RE | Admit: 2023-12-26 | Discharge: 2023-12-26 | Disposition: A | Discharge status: Home / Self Care | Payer: Medicare HMO | Source: Ambulatory Visit | Attending: Gastroenterology | Admitting: Gastroenterology

## 2023-12-26 DIAGNOSIS — R1013 Epigastric pain: Secondary | ICD-10-CM | POA: Insufficient documentation

## 2023-12-26 DIAGNOSIS — R634 Abnormal weight loss: Secondary | ICD-10-CM | POA: Diagnosis present

## 2023-12-26 DIAGNOSIS — R140 Abdominal distension (gaseous): Secondary | ICD-10-CM | POA: Insufficient documentation

## 2023-12-26 DIAGNOSIS — K581 Irritable bowel syndrome with constipation: Secondary | ICD-10-CM | POA: Diagnosis present

## 2023-12-26 DIAGNOSIS — R9389 Abnormal findings on diagnostic imaging of other specified body structures: Secondary | ICD-10-CM | POA: Diagnosis present

## 2023-12-26 MED ORDER — IOHEXOL 300 MG/ML  SOLN
30.0000 mL | Freq: Once | INTRAMUSCULAR | Status: AC | PRN
  Administered 2023-12-26: 30 mL via ORAL

## 2023-12-26 MED ORDER — IOHEXOL 300 MG/ML  SOLN
100.0000 mL | Freq: Once | INTRAMUSCULAR | Status: AC | PRN
  Administered 2023-12-26: 100 mL via INTRAVENOUS

## 2023-12-27 ENCOUNTER — Ambulatory Visit (HOSPITAL_COMMUNITY)
Admission: EM | Admit: 2023-12-27 | Discharge: 2023-12-27 | Disposition: A | Discharge status: Home / Self Care | Payer: Medicare HMO | Attending: Internal Medicine | Admitting: Internal Medicine

## 2023-12-27 ENCOUNTER — Encounter (HOSPITAL_COMMUNITY): Payer: Self-pay

## 2023-12-27 DIAGNOSIS — L299 Pruritus, unspecified: Secondary | ICD-10-CM | POA: Diagnosis not present

## 2023-12-27 MED ORDER — METHYLPREDNISOLONE 4 MG PO TBPK: ORAL_TABLET | ORAL | 0 refills | Status: DC

## 2023-12-27 NOTE — ED Triage Notes (Signed)
Patient here today with c/o itching all over X 1 week. Patient has a h/o CHF.

## 2023-12-27 NOTE — ED Provider Notes (Signed)
[facility]    CSN: [id number] Arrival date & time: 12/27/23  1128      History   Chief Complaint Chief Complaint  Patient presents with   Pruritis    HPI Autumn Fitzgerald is a 78 y.o. female who presents with onset of itching on top scalp,  her upper back and abdomen x 1 week. She has not used anything new or eaten anything new. States she did this 20 years ago and was placed on antihistamines, so when it would come back she would take that. But now that she has CHF she is not allowed to have any antihistamines. She was able to find a ride today, so finally came. She has an allergies whom she follows with q 6 months. She denies noticing a rash. Had normal labs 12/19/23.     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)    Hypothyroidism    followed by pcp   LBBB (left bundle branch block)    Mild persistent allergic asthma    Palpitations    Panic attacks    Pneumonia    PONV (postoperative nausea and vomiting)    Pre-diabetes    Tricuspid regurgitation 07/06/2022   severe   Wears glasses     Patient Active Problem List   Diagnosis Date Noted   Pain in joint of left knee 05/02/2022   Arthritis of carpometacarpal (CMC) joint of left thumb 02/23/2022   Not well controlled moderate persistent asthma 12/16/2021   Chronic rhinitis 12/16/2021   Pain in joint of right shoulder 02/05/2019   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   Trochanteric bursitis 09/16/2018   Viral URI with cough 10/09/2017   Encounter for loop recorder at  end of battery life 08/02/2016   New onset of headaches after age 35 09/30/2015   Dizziness 09/30/2015   Vision loss, bilateral 09/30/2015   Visual impairment severe bilaterally 09/30/2015   Lichen sclerosus et atrophicus 06/18/2014   Benign gastric polyp 02/20/2014   Degenerative joint disease involving multiple joints 09/10/2013   Hypertrophic obstructive cardiomyopathy (HCC) 07/23/2013   LBBB (left bundle branch block) 07/23/2013   Subclinical hypothyroidism 05/14/2012   Lower abdominal pain 01/10/2012   Panic attacks    LPRD (laryngopharyngeal reflux disease)    Mixed hyperlipidemia    Mild persistent asthma    HTN (hypertension)    Palpitations    Allergic rhinitis 02/24/2011   Anxiety, generalized 02/24/2011   Chronic recurrent major depressive disorder (HCC) 02/24/2011    Past Surgical History:  Procedure Laterality Date   CARDIAC SURGERY  02/05/2007   in [place]   Septal Myectomy  for HOCM   CATARACT EXTRACTION W/ INTRAOCULAR LENS  IMPLANT, BILATERAL  right 07-23-2017;  left 10-15-2017   COLONOSCOPY N/A 11/24/2013   Procedure: COLONOSCOPY;  Surgeon: Charna Elizabeth, MD;  Location: [facility];  Service: Endoscopy;  Laterality: N/A;   DOPPLER ECHOCARDIOGRAPHY  03/14/2013   EF 65-70%,trivial AI,mild-mod.  MR,Mod. TR,mod. concentric hypertrophy   EP IMPLANTABLE DEVICE N/A 08/22/2016   Procedure: Loop Recorder Removal;  Surgeon: Thurmon Fair, MD;  [facility];  Service: Cardiovascular;  Laterality: N/A;   ESOPHAGOGASTRODUODENOSCOPY N/A 11/24/2013   Procedure: ESOPHAGOGASTRODUODENOSCOPY (EGD);  Surgeon: Charna Elizabeth, MD;  Location: [facility];  Service: Endoscopy;  Laterality: N/A;   GLAUCOMA SURGERY Bilateral 2019   "release pressure   KNEE ARTHROSCOPY Right 10/17/2013   Procedure: ARTHROSCOPY RIGHT KNEE;  Surgeon: Verlee Rossetti, MD;  Location[facility];  Service: Orthopedics;  Laterality: Right;   KNEE ARTHROSCOPY Left 11/08/2022   Procedure: ARTHROSCOPY  KNEE; MEDIAL AND LATERAL MENISECTOMIES, CHONDROPLASTY;  Surgeon: Beverely Low, MD;  Location: [facility];  Service: Orthopedics;  Laterality: Left;  60 min   LAPAROSCOPIC CHOLECYSTECTOMY  02-03-2010   dr d blackman  @[facility]    LOOP RECORDER IMPLANT  03/18/2013   LOOP RECORDER IMPLANT N/A 03/18/2013   Procedure: LOOP RECORDER IMPLANT;  Surgeon: Thurmon Fair, MD;  Location: [facility];  Service: Cardiovascular;  Laterality: N/A;   SHOULDER ARTHROSCOPY WITH ROTATOR CUFF REPAIR Right 01/16/2019   Procedure: Right shoulder arthroscopy with subacromial decompression, distal clavicle resection, rotator cuff repair, bicep tenotomy;  Surgeon: Francena Hanly, MD;  Location: [facility];  Service: Orthopedics;  Laterality: Right;   TUBAL LIGATION  yrs ago    OB History     Gravida  5   Para  5   Term  5   Preterm      AB      Living  4      SAB      IAB      Ectopic      Multiple      Live Births               Home Medications    Prior to Admission medications   Medication Sig Start Date End Date Taking? Authorizing Provider  methylPREDNISolone (MEDROL DOSEPAK) 4 MG TBPK tablet As directed 12/27/23  Yes Rodriguez-Southworth, Nettie Elm, PA-C  acetaminophen (TYLENOL) 325 MG tablet Take 650 mg by mouth every 6 (six) hours as needed.    [provider]  albuterol (VENTOLIN HFA) 108 (90 Base) MCG/ACT inhaler INHALE 2 PUFFS BY MOUTH EVERY 4 HOURS AS NEEDED FOR WHEEZING OR SHORTNESS OF BREATH 11/30/23   Kozlow, Alvira Philips, MD  atenolol (TENORMIN) 25 MG tablet Take 0.5 tablets (12.5 mg total) by mouth daily. 12/03/19   Croitoru, Mihai, MD  Boswellia-Glucosamine-Vit D (OSTEO BI-FLEX ONE PER DAY PO) Take 1 tablet by mouth.    [provider]  Cholecalciferol 50 MCG (2000 UT) TABS Take 2,000 Units by mouth daily.    [provider]  clobetasol ointment (TEMOVATE) 0.05 % Apply 1 Application topically 3 (three) times a week. 10/01/23   Constant, Peggy, MD  clonazePAM (KLONOPIN) 2 MG  tablet Take 2 mg by mouth daily as needed. 11/15/22   [provider]  dicyclomine (BENTYL) 20 MG tablet Take 20 mg by mouth daily at 6 (six) AM. 11/02/23   [provider]  ezetimibe-simvastatin (VYTORIN) 10-40 MG per tablet Take 1 tablet by mouth every evening.     [provider]  Fluticasone Furoate (ARNUITY ELLIPTA) 100 MCG/ACT AEPB Inhale 1 puff into the lungs daily. 08/28/23   Kozlow, Alvira Philips, MD  levothyroxine (SYNTHROID) 50 MCG tablet Take 50 mcg by mouth daily.  06/15/14   [provider]  Multiple Vitamin (MULTIVITAMIN) tablet Take 1 tablet by  mouth daily.    [provider]  pantoprazole (PROTONIX) 40 MG tablet Take 1 tablet (40 mg total) by mouth in the morning. 08/28/23   Kozlow, Alvira Philips, MD  Polyethyl Glycol-Propyl Glycol 0.4-0.3 % SOLN Place one drop in both eyes daily as needed Patient not taking: Reported on 12/19/2023 01/04/21   Kozlow, Alvira Philips, MD    Family History Family History  Problem Relation Age of Onset   Cancer Mother        breast   Hypertension Mother    Breast cancer Mother    Cancer Father        prostate   Diabetes Sister    Heart attack Sister    Cancer Brother    Liver disease Neg Hx    Colon cancer Neg Hx    Esophageal cancer Neg Hx    Stomach cancer Neg Hx    Rectal cancer Neg Hx     Social History Social History   Tobacco Use   Smoking status: Never   Smokeless tobacco: Never  Vaping Use   Vaping status: Never Used  Substance Use Topics   Alcohol use: No   Drug use: No     Allergies   Amoxicillin-pot clavulanate, Temazepam, Lipitor [atorvastatin calcium], Amoxicillin, Lansoprazole, and Penicillin g   Review of Systems Review of Systems As noted in HPI  Physical Exam Triage Vital Signs ED Triage Vitals [12/27/23 1240]  Encounter Vitals Group     BP 135/83     Systolic BP Percentile      Diastolic BP Percentile      Pulse Rate 80     Resp 16     Temp 97.7 F (36.5 C)     Temp Source  Oral     SpO2 99 %     Weight 128 lb (58.1 kg)     Height 5\' 1"  (1.549 m)     Head Circumference      Peak Flow      Pain Score 0     Pain Loc      Pain Education      Exclude from Growth Chart    No data found.  Updated Vital Signs BP 135/83 (BP Location: Right Arm)   Pulse 80   Temp 97.7 F (36.5 C) (Oral)   Resp 16   Ht 5\' 1"  (1.549 m)   Wt 128 lb (58.1 kg)   SpO2 99%   BMI 24.19 kg/m   Visual Acuity Right Eye Distance:   Left Eye Distance:   Bilateral Distance:    Right Eye Near:   Left Eye Near:    Bilateral Near:     Physical Exam Vitals and nursing note reviewed.  Constitutional:      General: She is not in acute distress.    Appearance: She is normal weight. She is not toxic-appearing.  HENT:     Right Ear: External ear normal.     Left Ear: External ear normal.  Eyes:     General: No scleral icterus.    Conjunctiva/sclera: Conjunctivae normal.  Pulmonary:     Effort: Pulmonary effort is normal.  Musculoskeletal:     Cervical back: Neck supple.  Skin:    General: Skin is warm and dry.     Findings: No bruising, erythema, lesion or rash.  Neurological:     Mental Status: She is alert and oriented to person, place, and time.     Gait: Gait normal.  Psychiatric:  Mood and Affect: Mood normal.        Behavior: Behavior normal.        Thought Content: Thought content normal.        Judgment: Judgment normal.      UC Treatments / Results  Labs (all labs ordered are listed, but only abnormal results are displayed) Labs Reviewed - No data to display  EKG   Radiology No results found.  Procedures Procedures (including critical care time)  Medications Ordered in UC Medications - No data to display  Initial Impression / Assessment and Plan / UC Course  I have reviewed the triage vital signs and the nursing notes.  Pruritus of unknown cause  I placed her on Medrol and advised to FU with her allergies in 7-10 days if this does not  resolve Final Clinical Impressions(s) / UC Diagnoses   Final diagnoses:  Pruritus   Discharge Instructions   None    ED Prescriptions     Medication Sig Dispense Auth. Provider   methylPREDNISolone (MEDROL DOSEPAK) 4 MG TBPK tablet As directed 21 tablet Rodriguez-Southworth, Nettie Elm, PA-C      PDMP not reviewed this encounter.   Garey Ham, [place] 12/27/23 1338

## 2024-01-04 ENCOUNTER — Encounter: Payer: Self-pay | Admitting: Internal Medicine

## 2024-01-04 ENCOUNTER — Other Ambulatory Visit: Payer: Self-pay

## 2024-01-04 ENCOUNTER — Ambulatory Visit: Payer: Medicare HMO | Admitting: Internal Medicine

## 2024-01-04 VITALS — BP 108/60 | HR 77 | Temp 98.3°F | Resp 12 | Ht 59.65 in | Wt 127.1 lb

## 2024-01-04 DIAGNOSIS — K219 Gastro-esophageal reflux disease without esophagitis: Secondary | ICD-10-CM

## 2024-01-04 DIAGNOSIS — J4540 Moderate persistent asthma, uncomplicated: Secondary | ICD-10-CM

## 2024-01-04 DIAGNOSIS — L299 Pruritus, unspecified: Secondary | ICD-10-CM | POA: Diagnosis not present

## 2024-01-04 DIAGNOSIS — J3089 Other allergic rhinitis: Secondary | ICD-10-CM

## 2024-01-04 NOTE — Patient Instructions (Addendum)
Will get labs to evaluate for causes of pruritus: tryptase, Cu index, alpha gal   If pruritus (itch) returns start allegra (fexofenadine) 1-2 times per day for at least 7 days or until itch resolves    Start low FODMAP diet for IBS   4.  Continue Arnuity 100 - 1 inhalation 1-7 times per week  5.  Continue Flonase - 1 spray each nostril 1-7 times per week  6.  Continue pantoprazole 40 mg in a.m.    7.  Can use nasal saline and systane/ refresh eye drops if needed.  8.  Can use albuterol HFA 2 puffs every 4-6 hours if needed.  9. Can increase Arnuity and Flonase to 2 times per day during "Flare up"  10. Return to clinic in 6 months or earlier if problem  11. Obtain fall flu vaccine, consider Covid and RSV vaccines   Follow up: 6 months   Thank you so much for letting me partake in your care today.  Don't hesitate to reach out if you have any additional concerns!  Ferol Luz, MD  [facility] [place] [place]   What can I eat and drink on a low-FODMAP diet?  Below are some examples of foods that are low in F?D????.  ?Grains - White rice, brown rice, quinoa, cornmeal, and oats. Choose grains that are marked certified gluten-free.  ?Dairy products - Lactose-free dairy products like almond milk and oat milk. Certain cheeses like cheddar, [place], brie, mozzarella, parmesan, and feta.  ?Meats and proteins - Lean, tender, well-cooked meats, chicken, and fish. Eggs, tempeh, tofu, peanuts, pecans, macadamia nuts, walnuts, pine nuts, pumpkin seed, sesame seeds, sunflower seeds, and nut butters made with these nuts.  ?Fruits - Grapes, oranges, strawberries, blueberries, raspberries, pineapple, honeydew melon, cantaloupe, kiwi, limes, and starfruit.  ?Vegetables - Zucchini, cucumbers, eggplant, squash, potatoes, celery, carrots, bell peppers, bean sprouts, radishes, kale, spinach, bok choy, green beans, and lettuce.  ?Other foods - Canola and olive oils and dark  chocolate. Use maple syrup, brown sugar, stevia, or table sugar as sweeteners.  What foods and drinks should I avoid on a low-FODMAP diet?  Below are some examples of foods that are high in F?D??Ps. Avoid these foods.  ?Grains to avoid - Wheat, barley, rye, and products made with these grains such as bread, cereal, biscuits, and crackers.  ?Dairy products to avoid - Dairy products with lactose like milk and milk products like custard, yogurt, buttermilk, soft cheese, whipped cream, cottage cheese, ice cream, and soy milk.  ?Meats and proteins to avoid - Pistachios, cashews, and nut butters made from these nuts. Hummus, dried peas, lentils, beans, and other legumes.  ?Fruits to avoid - Apples, pears, mangoes, cherries, apricots, nectarines, plums, watermelon, peaches, blackberries, prunes, avocados, and fruit juices made with these fruits.  ?Vegetables to avoid - Onions, leeks, garlic, shallots, asparagus, sugar snap peas, mushrooms, cauliflower, artichokes, and green peas.  ?Other foods to avoid - High-fructose corn syrup, agave nectar, honey, chai and chamomile tea, soda, sports drinks, milk chocolate. Low-calorie sweeteners such as xylitol, which is found in sugar-free gum and mints. Added fibers in the form of chicory root or inulin.

## 2024-01-04 NOTE — Progress Notes (Signed)
FOLLOW UP Date of Service/Encounter:  01/14/24  Subjective:  Autumn Fitzgerald (DOB: 1946/01/15) is a 78 y.o. female [facility] in re-evaluation of the following: asthma, allergic rhinitis, LPR  History obtained from: chart review and patient.  For Review, LV was on 08/28/23  with Dr. Lucie Leather seen for routine follow-up.  Since last visit she had UC visit on 12/27/23 for pruritus treated with medrol dose pack.  Labs obtained on 12/19/2023 showed normal TSH, lipase, CMP, CBC she also underwent an EGD for stomach pain, biopsy showed benign gastric polyp, chronic gastritis and was negative for H. pylori  Today presents for follow-up. Discussed the use of AI scribe software for clinical note transcription with the patient, who gave verbal consent to proceed.  History of Present Illness   The patient, with a history of asthma, allergies, reflux, and recently diagnosed IBS, presents with a chief complaint of severe itching. The itching, which started a week prior to the visit, was so severe that it led to a visit to urgent care. The patient describes the itching as being all over the head and neck. There was no associated rash, but the patient did scratch the skin with a nail. The patient has a history of similar itching episodes in [place], which resulted in a rash on the face and body. The patient was given prednisone at urgent care, which successfully resolved the itching.  The patient also has a history of congestive heart failure, for which they underwent open heart surgery 16 years ago. The patient has been advised against taking certain antihistamines due to their heart condition.  In addition to the itching, the patient reports ongoing stomach pain associated with IBS. The patient describes the pain as being in the back and leading to bloating after eating. This has resulted in a weight loss of six pounds. The patient is currently taking Arnuity, a  daily inhaler, Protonix for acid reflux, and has an albuterol inhaler, which they report not needing to use.         All medications reviewed by clinical staff and updated in chart. No new pertinent medical or surgical history except as noted in HPI.  ROS: All others negative except as noted per HPI.   Objective:  BP 108/60 (BP Location: Right Arm, Patient Position: Sitting, Cuff Size: Normal)   Pulse 77   Temp 98.3 F (36.8 C) (Temporal)   Resp 12   Ht 4' 11.65" (1.515 m)   Wt 127 lb 1.6 oz (57.7 kg)   SpO2 97%   BMI 25.12 kg/m  Body mass index is 25.12 kg/m. Physical Exam: General Appearance:  Alert, cooperative, no distress, appears stated age  Head:  Normocephalic, without obvious abnormality, atraumatic  Eyes:  Conjunctiva clear, EOM's intact  Ears EACs normal bilaterally  Nose: Nares normal, hypertrophic turbinates, normal mucosa, no visible anterior polyps, and septum midline  Throat: Lips, tongue normal; teeth and gums normal, normal posterior oropharynx  Neck: Supple, symmetrical  Lungs:   clear to auscultation bilaterally, Respirations unlabored, no coughing  Heart:  regular rate and rhythm and no murmur, Appears well perfused  Extremities: No edema  Skin: Skin color, texture, turgor normal and no rashes or lesions on visualized portions of skin  Neurologic: No gross deficits   Labs:  Lab Orders         Tryptase         Chronic Urticaria  Alpha-Gal Panel       Assessment/Plan   Will get labs to evaluate for causes of pruritus: tryptase, Cu index, alpha gal   If pruritus (itch) returns start allegra (fexofenadine) 1-2 times per day for at least 7 days or until itch resolves    Start low FODMAP diet for IBS   4.  Continue Arnuity 100 - 1 inhalation 1-7 times per week  5.  Continue Flonase - 1 spray each nostril 1-7 times per week  6.  Continue pantoprazole 40 mg in a.m.    7.  Can use nasal saline and systane/ refresh eye drops if  needed.  8.  Can use albuterol HFA 2 puffs every 4-6 hours if needed.  9. Can increase Arnuity and Flonase to 2 times per day during "Flare up"  10. Return to clinic in 6 months or earlier if problem  11. Obtain fall flu vaccine, consider Covid and RSV vaccines   Follow up: 6 months   Thank you so much for letting me partake in your care today.  Don't hesitate to reach out if you have any additional concerns!  Ferol Luz, MD  [facility] [place] [place]  Other:     Thank you so much for letting me partake in your care today.  Don't hesitate to reach out if you have any additional concerns!  Ferol Luz, MD  [facility] [place], [place]

## 2024-01-15 LAB — ALPHA-GAL PANEL
Allergen Lamb IgE: <0.1 kU/L
Beef IgE: <0.1 kU/L
IgE (Immunoglobulin E), Serum: 34 [IU]/mL (ref 6–495)
O215-IgE Alpha-Gal: <0.1 kU/L
Pork IgE: <0.1 kU/L

## 2024-01-15 LAB — CHRONIC URTICARIA: cu index: 5.6 (ref <10)

## 2024-01-15 LAB — TRYPTASE: Tryptase: 5.2 ug/L (ref 2.2–13.2)

## 2024-01-22 ENCOUNTER — Ambulatory Visit (INDEPENDENT_AMBULATORY_CARE_PROVIDER_SITE_OTHER): Payer: Medicare HMO | Admitting: Gastroenterology

## 2024-01-22 ENCOUNTER — Encounter: Payer: Self-pay | Admitting: Gastroenterology

## 2024-01-22 VITALS — BP 104/62 | HR 84 | Ht 61.0 in | Wt 127.1 lb

## 2024-01-22 DIAGNOSIS — K581 Irritable bowel syndrome with constipation: Secondary | ICD-10-CM | POA: Diagnosis not present

## 2024-01-22 DIAGNOSIS — R1084 Generalized abdominal pain: Secondary | ICD-10-CM | POA: Diagnosis not present

## 2024-01-22 DIAGNOSIS — F411 Generalized anxiety disorder: Secondary | ICD-10-CM

## 2024-01-22 DIAGNOSIS — R1013 Epigastric pain: Secondary | ICD-10-CM

## 2024-01-22 DIAGNOSIS — R634 Abnormal weight loss: Secondary | ICD-10-CM | POA: Diagnosis not present

## 2024-01-22 DIAGNOSIS — K589 Irritable bowel syndrome without diarrhea: Secondary | ICD-10-CM

## 2024-01-22 NOTE — Progress Notes (Signed)
Chief Complaint: abdominal pain and weight loss Primary GI MD: Dr. Chales Abrahams  HPI: 78 year old female history of HOCM s/p septa myectomy in 2005, GERD, hypertension, hyperlipidemia, history of near syncope s/p loop recorder that was explanted 2017, hypothyroidism, LBBB and prediabetes, meningioma, normal ejection fraction, anxiety, s/p cholecystectomy, presents for follow-up of IBS.  Patient recently seen 12/19/2023 by Dr. Chales Abrahams.  At that time he was having epigastric pain and weight loss with abnormal CT showing gastric wall thickening.  Underwent EGD 10/2023 which was negative except for gastric polyps.  She does have history of pyloric stenosis s/p dilation to 14 mm 2018.  Previous negative evaluation for gastroparesis including GES, EGD EGD 2019.  Diagnosed with functional dyspepsia.  At last visit patient was recommended Protonix 40 Mg once daily, MiraLAX daily, Bentyl 20 Mg twice daily  Underwent repeat CT abdomen pelvis 01/02/2024 which showed under distended stomach with apparent mural thickening correlate for gastritis.  No pericolonic inflammation.  Normal liver.  Normal pancreas without ductal dilation.  Lab work 12/19/2023 Normal CBC, CMP, lipase, TSH  ----------------TODAY---------------------  Patient presents today reporting persistent abdominal pain located in her upper abdomen that wraps around her back and to her lower abdomen.  Patient states the pain is constant and persistent.  No clear exacerbation with food or bowel movements.  States the pain never goes away.  She reports continued weight loss.  In total she states she has lost about 6 pounds in 1 month.  Is having trouble eating due to the pain.  She is very concerned as all of the testing has been negative and she is concerned she has cancer and would like further workup.  Doing well with her bowel habits and taking all of her medicine correctly and none of it is working for her.  Wt Readings from Last 3 Encounters:  01/22/24  127 lb 2 oz (57.7 kg)  01/04/24 127 lb 1.6 oz (57.7 kg)  12/27/23 128 lb (58.1 kg)    Weight in November 2024 - 137lbs.  PREVIOUS GI WORKUP   She has been having epi pain x since 2015, worse with eating.  Associated with bloating and early satiety.  She also has intermittent nausea.  No dysphagia.  Has seen several gastroenterologists with extensive GI workup.  -Seen by Dr. Loreta Ave had neg EGD in 2015, normal GES, negative CT Abdo/pelvis, negative hydrogen SIBO breath test 08/2017 -Seen by Dr. Mare Loan underwent EGD 11/05/2017 which showed pyloric stenosis, dilated to 14 mm.  Multiple hyperplastic gastric polyps.  She did not have any benefit after pyloric dilatation.  Underwent repeat GES on 01/03/2018 which was normal.  She also had normal EGD on 01/10/2018.  Diagnosed with functional dyspepsia.  Given trial of amitriptyline 25 mg p.o. nightly which she did not tolerate. -She has tried multiple medications including probiotics, PPIs, IBgard, FD guard without any significant relief.   EGD 10/31/2023 - Normal esophagus. - Z- line regular, 35 cm from the incisors. - Multiple gastric polyps. Resected and retrieved x 3. - Normal examined duodenum. -Bx: neg for HP, fundic gland polyps   Colonoscopy 10/31/2023 - One 4 mm polyp in the distal sigmoid colon, removed with a cold snare. Resected and retrieved. - Non- bleeding internal hemorrhoids. - The examined portion of the ileum was normal. -Bx- neg. No need to rpt     CT AP with contrast 08/31/2023 No bowel obstruction, free air or free fluid. Normal appendix. Stomach is underdistended but there is some questionable fold  thickening. Please correlate with any particular symptoms. Fatty liver infiltration.  Previous cholecystectomy.   EGD 11/05/2017 (Dr Truitt Merle): Pyloric stenosis, dil 14 mm. Multiple small gastric polyps   Colon 07/15/2018: (Dr. Truitt Merle) Small sessile polyps s/p polypectomy.  I could not find biopsy results.  Repeat 5  years.   EGD 11/24/2013: Dr. Caroline Sauger polyps s/p polypectomy using hot snare, small HH.   Colonoscopy 11/24/2013: Dr. Rosana Fret but normal.  Some stool in the colon.  Internal hemorrhoids.  Per report repeat 10 years.   MRI head with contrast 02/2022 1. Negative for retrocochlear lesion. 2. Mild mastoid opacification on the left more than right. 3. 9 mm high right frontal meningioma without worrisome change from 2016.  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)    Hypothyroidism    followed by pcp   LBBB (left bundle branch block)    Mild persistent allergic asthma    Palpitations    Panic attacks    Pneumonia    PONV (postoperative nausea and vomiting)    Pre-diabetes    Tricuspid regurgitation 07/06/2022   severe   Wears glasses     Past Surgical History:  Procedure Laterality Date   CARDIAC SURGERY  02/05/2007   in [place]   Septal Myectomy  for HOCM   CATARACT EXTRACTION W/ INTRAOCULAR LENS  IMPLANT, BILATERAL  right 07-23-2017;  left 10-15-2017   COLONOSCOPY N/A 11/24/2013   Procedure: COLONOSCOPY;  Surgeon: Charna Elizabeth, MD;  Location: [facility];  Service: Endoscopy;  Laterality: N/A;   DOPPLER ECHOCARDIOGRAPHY  03/14/2013   EF 65-70%,trivial AI,mild-mod. MR,Mod. TR,mod. concentric hypertrophy   EP IMPLANTABLE DEVICE N/A 08/22/2016   Procedure: Loop Recorder Removal;  Surgeon: Thurmon Fair, MD;  Location: [facility];  Service: Cardiovascular;  Laterality: N/A;   ESOPHAGOGASTRODUODENOSCOPY N/A 11/24/2013   Procedure:  ESOPHAGOGASTRODUODENOSCOPY (EGD);  Surgeon: Charna Elizabeth, MD;  Location: [facility];  Service: Endoscopy;  Laterality: N/A;   GLAUCOMA SURGERY Bilateral 2019   "release pressure   KNEE ARTHROSCOPY Right 10/17/2013   Procedure: ARTHROSCOPY RIGHT KNEE;  Surgeon: Verlee Rossetti, MD;  Location: [facility] OR;  Service: Orthopedics;  Laterality: Right;   KNEE ARTHROSCOPY Left 11/08/2022   Procedure: ARTHROSCOPY KNEE; MEDIAL AND LATERAL MENISECTOMIES, CHONDROPLASTY;  Surgeon: Beverely Low, MD;  Location: [facility];  Service: Orthopedics;  Laterality: Left;  60 min   LAPAROSCOPIC CHOLECYSTECTOMY  02-03-2010   dr d blackman  @[facility]    LOOP RECORDER IMPLANT  03/18/2013   LOOP RECORDER IMPLANT N/A 03/18/2013   Procedure: LOOP RECORDER IMPLANT;  Surgeon: Thurmon Fair, MD;  Location: [facility];  Service: Cardiovascular;  Laterality: N/A;   SHOULDER ARTHROSCOPY WITH ROTATOR CUFF REPAIR Right 01/16/2019   Procedure: Right shoulder arthroscopy with subacromial decompression, distal clavicle resection, rotator cuff repair, bicep tenotomy;  Surgeon: Francena Hanly, MD;  Location: [facility];  Service: Orthopedics;  Laterality: Right;   TUBAL LIGATION  yrs ago  Current Outpatient Medications  Medication Sig Dispense Refill   acetaminophen (TYLENOL) 325 MG tablet Take 650 mg by mouth every 6 (six) hours as needed.     albuterol (VENTOLIN HFA) 108 (90 Base) MCG/ACT inhaler INHALE 2 PUFFS BY MOUTH EVERY 4 HOURS AS NEEDED FOR WHEEZING OR SHORTNESS OF BREATH 18 g 1   atenolol (TENORMIN) 25 MG tablet Take 0.5 tablets (12.5 mg total) by mouth daily. 30 tablet 3   Boswellia-Glucosamine-Vit D (OSTEO BI-FLEX ONE PER DAY PO) Take 1 tablet by mouth.     Cholecalciferol 50 MCG (2000 UT) TABS Take 2,000 Units by mouth daily.     clobetasol ointment (TEMOVATE) 0.05 % Apply 1 Application topically 3 (three) times a week. 60 g 3   clonazePAM (KLONOPIN) 2 MG tablet Take 2 mg by mouth daily as needed.     dicyclomine (BENTYL) 20 MG tablet Take  20 mg by mouth daily at 6 (six) AM.     ezetimibe-simvastatin (VYTORIN) 10-40 MG per tablet Take 1 tablet by mouth every evening.      Fluticasone Furoate (ARNUITY ELLIPTA) 100 MCG/ACT AEPB Inhale 1 puff into the lungs daily. 30 each 5   levothyroxine (SYNTHROID) 50 MCG tablet Take 50 mcg by mouth daily.      Multiple Vitamin (MULTIVITAMIN) tablet Take 1 tablet by mouth daily.     pantoprazole (PROTONIX) 40 MG tablet Take 1 tablet (40 mg total) by mouth in the morning. 90 tablet 1   Polyethyl Glycol-Propyl Glycol 0.4-0.3 % SOLN Place one drop in both eyes daily as needed (Patient not taking: Reported on 01/22/2024) 30 mL 2   No current facility-administered medications for this visit.    Allergies as of 01/22/2024 - Review Complete 01/22/2024  Allergen Reaction Noted   Amoxicillin-pot clavulanate Other (See Comments) 06/02/2014   Temazepam Other (See Comments) 01/28/2016   Lipitor [atorvastatin calcium]  01/08/2019   Amoxicillin Nausea And Vomiting 07/17/2017   Lansoprazole Palpitations 10/26/2022   Penicillin g Other (See Comments) 01/08/2017    Family History  Problem Relation Age of Onset   Cancer Mother        breast   Hypertension Mother    Breast cancer Mother    Cancer Father        prostate   Diabetes Sister    Heart attack Sister    Cancer Brother    Cancer Son    Liver disease Neg Hx    Colon cancer Neg Hx    Esophageal cancer Neg Hx    Stomach cancer Neg Hx    Rectal cancer Neg Hx     Social History   Socioeconomic History   Marital status: Divorced    Spouse name: Not on file   Number of children: 5   Years of education: 10   Highest education level: Not on file  Occupational History   Occupation: retired  Tobacco Use   Smoking status: Never    Passive exposure: Past   Smokeless tobacco: Never  Vaping Use   Vaping status: Never Used  Substance and Sexual Activity   Alcohol use: No   Drug use: No   Sexual activity: Never    Birth  control/protection: Post-menopausal, Surgical    Comment: divorced  Other Topics Concern   Not on file  Social History Narrative   Lives at home by herself.    Caffeine use: none    Social Drivers of Corporate investment banker Strain: Not on [employer]  Insecurity: Not on file  Transportation Needs: Not on file  Physical Activity: Not on file  Stress: Not on file  Social Connections: Not on file  Intimate Partner Violence: Not on file    Review of Systems:    Constitutional: No weight loss, fever, chills, weakness or fatigue HEENT: Eyes: No change in vision               Ears, Nose, Throat:  No change in hearing or congestion Skin: No rash or itching Cardiovascular: No chest pain, chest pressure or palpitations   Respiratory: No SOB or cough Gastrointestinal: See HPI and otherwise negative Genitourinary: No dysuria or change in urinary frequency Neurological: No headache, dizziness or syncope Musculoskeletal: No new muscle or joint pain Hematologic: No bleeding or bruising Psychiatric: No history of depression or anxiety    Physical Exam:  Vital signs: BP 104/62   Pulse 84   Ht 5\' 1"  (1.549 m)   Wt 127 lb 2 oz (57.7 kg)   SpO2 98%   BMI 24.02 kg/m   Constitutional: NAD, Well developed, Well nourished, alert and cooperative. Difficult historian, tangential thinking. Head:  Normocephalic and atraumatic. Eyes:   PEERL, EOMI. No icterus. Conjunctiva pink. Respiratory: Respirations even and unlabored. Lungs clear to auscultation bilaterally.   No wheezes, crackles, or rhonchi.  Cardiovascular:  Regular rate and rhythm. No peripheral edema, cyanosis or pallor.  Gastrointestinal:  Soft, nondistended, generalized tenderness. No rebound or guarding. Normal bowel sounds. No appreciable masses or hepatomegaly. Rectal:  Not performed.  Msk:  Symmetrical without gross deformities. Without edema, no deformity or joint abnormality.  Neurologic:  Alert and  oriented x4;  grossly  normal neurologically.  Skin:   Dry and intact without significant lesions or rashes. Psychiatric: Oriented to person, place and time. Demonstrates good judgement and reason without abnormal affect or behaviors.   RELEVANT LABS AND IMAGING: CBC    Component Value Date/Time   WBC 7.0 12/19/2023 1418   RBC 4.79 12/19/2023 1418   HGB 13.9 12/19/2023 1418   HGB 14.8 06/29/2020 0000   HCT 42.4 12/19/2023 1418   HCT 46.2 06/29/2020 0000   PLT 260.0 12/19/2023 1418   MCV 88.5 12/19/2023 1418   MCV 88 06/29/2020 0000   MCH 28.4 08/31/2023 1014   MCHC 32.8 12/19/2023 1418   RDW 13.3 12/19/2023 1418   RDW 12.8 06/29/2020 0000   LYMPHSABS 2.5 12/19/2023 1418   LYMPHSABS 2.3 06/29/2020 0000   MONOABS 0.6 12/19/2023 1418   EOSABS 0.2 12/19/2023 1418   EOSABS 0.1 06/29/2020 0000   BASOSABS 0.1 12/19/2023 1418   BASOSABS 0.1 06/29/2020 0000    CMP     Component Value Date/Time   NA 140 12/19/2023 1418   NA 144 06/29/2020 0000   K 3.7 12/19/2023 1418   CL 102 12/19/2023 1418   CO2 29 12/19/2023 1418   GLUCOSE 75 12/19/2023 1418   BUN 8 12/19/2023 1418   BUN 7 (L) 06/29/2020 0000   CREATININE 0.78 12/19/2023 1418   CREATININE 0.83 01/03/2021 0231   CALCIUM 9.1 12/19/2023 1418   PROT 7.5 12/19/2023 1418   PROT 7.4 06/29/2020 0000   ALBUMIN 4.5 12/19/2023 1418   ALBUMIN 4.4 06/29/2020 0000   AST 27 12/19/2023 1418   ALT 17 12/19/2023 1418   ALKPHOS 95 12/19/2023 1418   BILITOT 1.1 12/19/2023 1418   BILITOT 1.0 06/29/2020 0000   GFRNONAA >60 08/31/2023 1014   GFRNONAA 69 01/03/2021 0231   GFRAA 80 01/03/2021  0231     Assessment/Plan:   Epigastric pain Weight loss Generalized abdominal pain Extensive GI workup for chronic epigastric pain ongoing for many years with many endoscopic procedures that have been negative.  Negative eval for gastroparesis including GES, EGD EGD 2019 history of pyloric stenosis s/p dilation to 14 mm in 2018.  Recent repeat CTAP with contrast  unrevealing.  No improvement despite Protonix, MiraLAX, Bentyl.  Normal LFTs. No jaundice/clay stools.  10 pound weight loss since November 2024. -Schedule EUS per Dr. Urban Gibson previous recommendation - If EUS is negative consider TCA such as amitriptyline for gut brain axis disorder (with note that this could worsen her IBS-C so need to weight risk vs benefit) - Risks and benefits of EUS including sedation, perforation, post EUS pancreatitis explained to patient and she verbalizes understanding and wishes to proceed.    This visit required 38 minutes of patient care (this includes precharting, chart review, review of results, face-to-face time used for counseling as well as treatment plan and follow-up. The patient was provided an opportunity to ask questions and all were answered. The patient agreed with the plan and demonstrated an understanding of the instructions.   Lara Mulch [facility] 01/22/2024, 11:58 AM  Cc: [name], MD

## 2024-01-22 NOTE — Patient Instructions (Addendum)
You will be contact by Dr Meridee Score nurse do discuss EUS  _______________________________________________________  If your blood pressure at your visit was 140/90 or greater, please contact your primary care physician to follow up on this.  _______________________________________________________  If you are age 79 or older, your body mass index should be between 23-30. Your Body mass index is 24.02 kg/m. If this is out of the aforementioned range listed, please consider follow up with your Primary Care Provider.  If you are age 23 or younger, your body mass index should be between 19-25. Your Body mass index is 24.02 kg/m. If this is out of the aformentioned range listed, please consider follow up with your Primary Care Provider.   ________________________________________________________  The [facility] providers would like to encourage you to use [facility] to communicate with providers for non-urgent requests or questions.  Due to long hold times on the telephone, sending your provider a message by [facility] may be a faster and more efficient way to get a response.  Please allow 48 business hours for a response.  Please remember that this is for non-urgent requests.  _______________________________________________________   Thank you for entrusting me with your care and choosing [facility].  Bayley Leanna Sato, PA-C

## 2024-01-24 ENCOUNTER — Telehealth: Payer: Self-pay

## 2024-01-24 NOTE — Telephone Encounter (Signed)
-----   Message from [facility] sent at 01/24/2024  4:06 PM EST -----    ----- Message ----- From: Legrand Como, PA-C Sent: 01/22/2024  12:05 PM EST To: Lemar Lofty., MD; Lynann Bologna, MD

## 2024-01-24 NOTE — Progress Notes (Signed)
I think EUS is going to be low yield for the "under distention mural thickening of the stomach". With that being said we can certainly pursue this as per the recommendations of patient's primary GI in the setting of the continued weight loss. I would probably not wait for EUS scheduling before consideration of TCA for functional abdominal pain, but ultimately defer that to the patient's primary GI team. I will have my team work on scheduling my next available upper EUS for the patient.  If it is too far out, for the patient's preference, patient will need to be referred elsewhere.  Patty, you may go ahead and move forward with scheduling an upper EUS as able.

## 2024-01-24 NOTE — Telephone Encounter (Signed)
Author: Mansouraty, Netty Starring., MD Service: Gastroenterology Author Type: Physician  Filed: 01/24/2024  4:08 PM Encounter Date: 01/22/2024 Status: Signed  Editor: Mansouraty, Netty Starring., MD (Physician)   I think EUS is going to be low yield for the "under distention mural thickening of the stomach". With that being said we can certainly pursue this as per the recommendations of patient's primary GI in the setting of the continued weight loss. I would probably not wait for EUS scheduling before consideration of TCA for functional abdominal pain, but ultimately defer that to the patient's primary GI team. I will have my team work on scheduling my next available upper EUS for the patient.  If it is too far out, for the patient's preference, patient will need to be referred elsewhere.  Dianne Whelchel, you may go ahead and move forward with scheduling an upper EUS as able.

## 2024-01-28 ENCOUNTER — Other Ambulatory Visit: Payer: Self-pay

## 2024-01-28 DIAGNOSIS — R634 Abnormal weight loss: Secondary | ICD-10-CM

## 2024-01-28 DIAGNOSIS — R1013 Epigastric pain: Secondary | ICD-10-CM

## 2024-01-28 DIAGNOSIS — R1084 Generalized abdominal pain: Secondary | ICD-10-CM

## 2024-01-28 DIAGNOSIS — R9389 Abnormal findings on diagnostic imaging of other specified body structures: Secondary | ICD-10-CM

## 2024-01-28 DIAGNOSIS — R140 Abdominal distension (gaseous): Secondary | ICD-10-CM

## 2024-01-28 NOTE — Telephone Encounter (Signed)
EUS has been set up for 03/11/24 at 915 am at [facility] with GM   Left message on machine to call back

## 2024-01-29 NOTE — Telephone Encounter (Signed)
 EUS scheduled, pt instructed and medications reviewed.  Patient instructions mailed to home.  Patient to call with any questions or concerns.

## 2024-01-29 NOTE — Telephone Encounter (Signed)
Line rings then goes to fast busy will attempt later

## 2024-01-29 NOTE — Telephone Encounter (Signed)
Patient called to follow up on an appointment she's suppose to get for a procedure. She was advised that a message was left yesterday regarding her appt. The patient is stating she has not received any calls or voicemail's. Requested a call back to discuss further.

## 2024-01-31 ENCOUNTER — Other Ambulatory Visit: Payer: Self-pay | Admitting: Allergy and Immunology

## 2024-02-19 ENCOUNTER — Ambulatory Visit (INDEPENDENT_AMBULATORY_CARE_PROVIDER_SITE_OTHER): Payer: Medicare HMO | Admitting: Allergy and Immunology

## 2024-02-19 VITALS — BP 118/70 | HR 97 | Temp 98.0°F | Resp 16 | Ht 60.0 in | Wt 125.6 lb

## 2024-02-19 DIAGNOSIS — K219 Gastro-esophageal reflux disease without esophagitis: Secondary | ICD-10-CM | POA: Diagnosis not present

## 2024-02-19 DIAGNOSIS — J4540 Moderate persistent asthma, uncomplicated: Secondary | ICD-10-CM | POA: Diagnosis not present

## 2024-02-19 DIAGNOSIS — J3089 Other allergic rhinitis: Secondary | ICD-10-CM

## 2024-02-19 DIAGNOSIS — L299 Pruritus, unspecified: Secondary | ICD-10-CM

## 2024-02-19 MED ORDER — ARNUITY ELLIPTA 100 MCG/ACT IN AEPB: 1.0000 | INHALATION_SPRAY | Freq: Every morning | RESPIRATORY_TRACT | 1 refills | Status: DC

## 2024-02-19 MED ORDER — PANTOPRAZOLE SODIUM 40 MG PO TBEC: 40.0000 mg | DELAYED_RELEASE_TABLET | Freq: Every morning | ORAL | 1 refills | Status: DC

## 2024-02-19 MED ORDER — FLUTICASONE PROPIONATE 50 MCG/ACT NA SUSP: 1.0000 | Freq: Every day | NASAL | 1 refills | Status: DC

## 2024-02-19 MED ORDER — ALBUTEROL SULFATE HFA 108 (90 BASE) MCG/ACT IN AERS: 2.0000 | INHALATION_SPRAY | RESPIRATORY_TRACT | 1 refills | Status: DC | PRN

## 2024-02-19 NOTE — Progress Notes (Unsigned)
 [place] [facility] [place] [facility] [place] - [place] [facility] [place]   Follow-up Note  Referring Provider: [name], MD Primary Provider: [name], MD Date of Office Visit: 02/19/2024  Subjective:   Autumn Fitzgerald (DOB: 10-04-1946) is a 78 y.o. female who [facility] in re-evaluation of the following:  HPI: Autumn Fitzgerald returns to this clinic in evaluation of asthma, allergic rhinitis, LPR.  I last saw her in this clinic 28 August 2023.  She was seen by Dr. Marlynn Perking on 04 January 2024 for a episode of pruritus.  Fortunately her pruritic disorder has resolved and she never did use any antihistamines to address this issue.  She does not know why this issue started and she does not know why it resolved.  Her airway issue is going quite well.  She does not really have any problems with her nose or her chest.  Her reflux is under very good control.  She is having a problem with irritable bowel syndrome with constipation.  She does not use her medications on a regular basis.  She may use Arnuity a few times a week and she rarely uses any Flonase.  Sounds as though she does take pantoprazole on a pretty consistent basis.  Rarely does she need to use any albuterol.  Allergies as of 02/19/2024       Reactions   Amoxicillin-pot Clavulanate Other (See Comments)   Severe pains and GI symptoms Did it involve swelling of the face/tongue/throat, SOB, or low BP? No Did it involve sudden or severe rash/hives, skin peeling, or any reaction on the inside of your mouth or nose? No Did you need to seek medical attention at a hospital or doctor's office? No When did it last happen?      within the past 10 years If all above answers are "NO", may proceed with cephalosporin use.   Temazepam Other (See Comments)   Dizziness and doesn't work for patient   Lipitor [atorvastatin Calcium]    Muscle pain   Amoxicillin Nausea And Vomiting   Lansoprazole Palpitations    "Body felt sick"    Penicillin G Other (See Comments)   Unknown reaction         Medication List    acetaminophen 325 MG tablet Commonly known as: TYLENOL Take 650 mg by mouth every 6 (six) hours as needed.   albuterol 108 (90 Base) MCG/ACT inhaler Commonly known as: VENTOLIN HFA Inhale 2 puffs into the lungs every 4 (four) hours as needed for wheezing or shortness of breath. What changed: See the new instructions.   Arnuity Ellipta 100 MCG/ACT Aepb Generic drug: Fluticasone Furoate Inhale 1 puff into the lungs every morning. What changed: when to take this   atenolol 25 MG tablet Commonly known as: TENORMIN Take 0.5 tablets (12.5 mg total) by mouth daily.   Cholecalciferol 50 MCG (2000 UT) Tabs Take 2,000 Units by mouth daily.   clobetasol ointment 0.05 % Commonly known as: TEMOVATE Apply 1 Application topically 3 (three) times a week.   clonazePAM 2 MG tablet Commonly known as: KLONOPIN Take 2 mg by mouth daily as needed.   dicyclomine 20 MG tablet Commonly known as: BENTYL Take 20 mg by mouth daily at 6 (six) AM.   ezetimibe-simvastatin 10-40 MG tablet Commonly known as: VYTORIN Take 1 tablet by mouth every evening.   fluticasone 50 MCG/ACT nasal spray Commonly known as: FLONASE Place 1 spray into both nostrils daily.   levothyroxine  50 MCG tablet Commonly known as: SYNTHROID Take 50 mcg by mouth daily.   multivitamin tablet Take 1 tablet by mouth daily.   OSTEO BI-FLEX ONE PER DAY PO Take 1 tablet by mouth.   pantoprazole 40 MG tablet Commonly known as: PROTONIX Take 1 tablet (40 mg total) by mouth in the morning.   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)    Hypothyroidism    followed by pcp   LBBB (left bundle branch block)    Mild persistent allergic asthma    Palpitations    Panic attacks    Pneumonia    PONV (postoperative nausea and vomiting)    Pre-diabetes    Tricuspid regurgitation 07/06/2022   severe   Wears glasses     Past Surgical History:  Procedure Laterality Date   CARDIAC SURGERY  02/05/2007   in [place]   Septal Myectomy  for HOCM   CATARACT EXTRACTION W/ INTRAOCULAR LENS  IMPLANT, BILATERAL  right 07-23-2017;  left 10-15-2017   COLONOSCOPY N/A 11/24/2013   Procedure: COLONOSCOPY;  Surgeon: Charna Elizabeth, MD;  Location: [facility];  Service: Endoscopy;  Laterality: N/A;   DOPPLER ECHOCARDIOGRAPHY  03/14/2013   EF 65-70%,trivial AI,mild-mod. MR,Mod. TR,mod. concentric hypertrophy   EP IMPLANTABLE DEVICE N/A 08/22/2016   Procedure: Loop Recorder Removal;  Surgeon: Thurmon Fair, MD;  Location: [facility];  Service: Cardiovascular;  Laterality: N/A;   ESOPHAGOGASTRODUODENOSCOPY N/A 11/24/2013   Procedure: ESOPHAGOGASTRODUODENOSCOPY (EGD);  Surgeon: Charna Elizabeth, MD;  Location: [facility];  Service: Endoscopy;  Laterality: N/A;   GLAUCOMA SURGERY Bilateral 2019   "release pressure   KNEE ARTHROSCOPY Right 10/17/2013   Procedure: ARTHROSCOPY RIGHT KNEE;  Surgeon: Verlee Rossetti, MD;  Location: [facility];  Service: Orthopedics;  Laterality: Right;   KNEE ARTHROSCOPY Left 11/08/2022   Procedure: ARTHROSCOPY KNEE; MEDIAL AND LATERAL MENISECTOMIES, CHONDROPLASTY;  Surgeon: Beverely Low, MD;  Location: [facility];  Service: Orthopedics;  Laterality: Left;  60 min   LAPAROSCOPIC CHOLECYSTECTOMY  02-03-2010   dr d blackman  @[facility]    LOOP RECORDER IMPLANT  03/18/2013   LOOP  RECORDER IMPLANT N/A 03/18/2013   Procedure: LOOP RECORDER IMPLANT;  Surgeon: Thurmon Fair, MD;  Location: [facility];  Service: Cardiovascular;  Laterality: N/A;   SHOULDER ARTHROSCOPY WITH ROTATOR CUFF REPAIR Right 01/16/2019   Procedure: Right shoulder arthroscopy with subacromial decompression, distal clavicle resection, rotator cuff repair, bicep tenotomy;  Surgeon: Francena Hanly, MD;  Location: [facility];  Service: Orthopedics;  Laterality: Right;   TUBAL LIGATION  yrs ago    Review of systems negative except as noted in HPI / PMHx or noted below:  ROS   Objective:   There were no vitals filed for this visit. Height: 5\' 1"  (154.9 cm)      Physical Exam Constitutional:  Appearance: She is not diaphoretic.  HENT:     Head: Normocephalic.     Right Ear: Tympanic membrane, ear canal and external ear normal.     Left Ear: Tympanic membrane, ear canal and external ear normal.     Nose: Nose normal. No mucosal edema or rhinorrhea.     Mouth/Throat:     Pharynx: Uvula midline. No oropharyngeal exudate.  Eyes:     Conjunctiva/sclera: Conjunctivae normal.  Neck:     Thyroid: No thyromegaly.     Trachea: Trachea normal. No tracheal tenderness or tracheal deviation.  Cardiovascular:     Rate and Rhythm: Normal rate and regular rhythm.     Heart sounds: Normal heart sounds, S1 normal and S2 normal. No murmur heard. Pulmonary:     Effort: No respiratory distress.     Breath sounds: Normal breath sounds. No stridor. No wheezing or rales.  Lymphadenopathy:     Head:     Right side of head: No tonsillar adenopathy.     Left side of head: No tonsillar adenopathy.     Cervical: No cervical adenopathy.  Skin:    Findings: No erythema or rash.     Nails: There is no clubbing.  Neurological:     Mental Status: She is alert.     Diagnostics:    Spirometry was performed and demonstrated an FEV1 of 1.21 at 76 % of predicted.  The patient had an Asthma Control Test with the  following results:  .    Results of blood tests obtained 04 January 2024 identifies tryptase 5.2 UG/L, CU index 5.6, negative alpha gal panel with IgE 34 KU/L,  Assessment and Plan:   1. Asthma, moderate persistent, well-controlled   2. Other allergic rhinitis   3. LPRD (laryngopharyngeal reflux disease)     1.  Continue Arnuity 100 - 1 inhalation 1-7 times per week  2.  Continue Flonase - 1 spray each nostril 1-7 times per week  3.  Continue pantoprazole 40 mg in a.m.    4.  Can use nasal saline and systane/ refresh eye drops if needed.  5.  Can use albuterol HFA 2 puffs every 4-6 hours if needed.  6. Can increase Arnuity and Flonase to 2 times per day during "Flare up"  7. Return to clinic in 6 months or earlier if problem  8. Influenza = Tamiflu. Covid = Paxlovid  Autumn Fitzgerald fortunately has resolved her pruritic disorder and obviously there is no need for any further evaluation for this issue at this point in time.  Her airway issue appears to be under pretty good control at this point time while intermittently using anti-inflammatory agents for her airway.  Her reflux appears to be doing as well while using a proton pump inhibitor.  She will continue on the plan noted above and we will see her back in this clinic in 6 months or earlier if there is a problem.            Autumn Schimke, MD Allergy / Immunology [facility]

## 2024-02-19 NOTE — Patient Instructions (Signed)
  1.  Continue Arnuity 100 - 1 inhalation 1-7 times per week  2.  Continue Flonase - 1 spray each nostril 1-7 times per week  3.  Continue pantoprazole 40 mg in a.m.    4.  Can use nasal saline and systane/ refresh eye drops if needed.  5.  Can use albuterol HFA 2 puffs every 4-6 hours if needed.  6. Can increase Arnuity and Flonase to 2 times per day during "Flare up"  7. Return to clinic in 6 months or earlier if problem  8. Influenza = Tamiflu. Covid = Paxlovid

## 2024-02-20 ENCOUNTER — Encounter: Payer: Self-pay | Admitting: Allergy and Immunology

## 2024-03-04 ENCOUNTER — Telehealth: Payer: Self-pay | Admitting: Gastroenterology

## 2024-03-04 ENCOUNTER — Encounter (HOSPITAL_COMMUNITY): Payer: Self-pay | Admitting: Gastroenterology

## 2024-03-04 ENCOUNTER — Other Ambulatory Visit: Payer: Self-pay

## 2024-03-04 ENCOUNTER — Telehealth: Payer: Self-pay | Admitting: *Deleted

## 2024-03-04 NOTE — Telephone Encounter (Signed)
 Primary Cardiologist:Mihai Croitoru, MD   Preoperative team, please contact this patient and set up a phone call appointment for further preoperative risk assessment. Please obtain consent and complete medication review. Thank you for your help.   I confirm that guidance regarding antiplatelet and oral anticoagulation therapy has been completed and, if necessary, noted below (none requested).   I also confirmed the patient resides in the state of [place]. As per [facility] Medical Board telemedicine laws, the patient must reside in the state in which the provider is licensed.   Levi Aland, NP-C  03/04/2024, 4:03 PM [address], [address] Office [phone] Fax [phone]

## 2024-03-04 NOTE — Progress Notes (Addendum)
 PCP - [name], MD  Cardiologist - Dr. Ellwood Sayers 11-19-23   PPM/ICD -  Device Orders -  Rep Notified -   Chest x-ray -  EKG - 11-19-23 epic Stress Test -  ECHO - 01-06-22 epic Cardiac Cath -   Sleep Study -  CPAP -   Fasting Blood Sugar -  Checks Blood Sugar _____ times a day  Blood Thinner Instructions: Aspirin Instructions:  ERAS Protcol - PRE-SURGERY n/a   COVID vaccine -yes  Activity--Can complete ADL's without CP some SOB with vacuuming  not new for pt.   Anesthesia review: septal myomectomy 2005, CHF, cardiomyopathy  Patient denies shortness of breath, fever, cough and chest pain at PAT appointment   All instructions explained to the patient, with a verbal understanding of the material. Patient agrees to go over the instructions while at home for a better understanding. Patient also instructed to self quarantine after being tested for COVID-19. The opportunity to ask questions was provided.  Dr. Lesia Sago

## 2024-03-04 NOTE — Telephone Encounter (Signed)
 Pt has been scheduled tele preop appt 03/06/24. Med rec and consent are done.

## 2024-03-04 NOTE — Telephone Encounter (Signed)
 [facility] Pre-operative Risk Assessment     Request for surgical clearance:     Endoscopy Procedure  What type of surgery is being performed?     Upper endoscopic ultrasound  When is this surgery scheduled?     03/11/24  What type of clearance is required ?   MEDICAL CLEARANCE  Are there any medications that need to be held prior to surgery and how long? N/A   Practice name and name of physician performing surgery?      [facility]  What is your office phone and fax number?      Phone- [phone]  Fax- [phone]  Anesthesia type (None, local, MAC, general) ?       MAC   Please route your response to P [facility] POD C TRIAGE

## 2024-03-04 NOTE — Progress Notes (Signed)
Attempted to obtain medical history. Unable to reach pt. At this time. HIPAA complaint voicemail left with pre-surgical testing number. 

## 2024-03-04 NOTE — Telephone Encounter (Signed)
 I received a call from [facility] regarding this patient and they stated that the patient will need a cardiac clearance before she has her procedure. Please advise.

## 2024-03-04 NOTE — Telephone Encounter (Signed)
 Pt returning call, requesting cb

## 2024-03-04 NOTE — Telephone Encounter (Signed)
 Pt has been scheduled tele preop appt 03/06/24. Med rec and consent are done.      Patient Consent for Virtual Visit        Autumn Fitzgerald has provided verbal consent on 03/04/2024 for a virtual visit (video or telephone).   CONSENT FOR VIRTUAL VISIT FOR:  Autumn Fitzgerald  By participating in this virtual visit I agree to the following:  I hereby voluntarily request, consent and authorize [facility] and its employed or contracted physicians, physician assistants, nurse practitioners or other licensed health care professionals (the Practitioner), to provide me with telemedicine health care services (the "Services") as deemed necessary by the treating Practitioner. I acknowledge and consent to receive the Services by the Practitioner via telemedicine. I understand that the telemedicine visit will involve communicating with the Practitioner through live audiovisual communication technology and the disclosure of certain medical information by electronic transmission. I acknowledge that I have been given the opportunity to request an in-person assessment or other available alternative prior to the telemedicine visit and am voluntarily participating in the telemedicine visit.  I understand that I have the right to withhold or withdraw my consent to the use of telemedicine in the course of my care at any time, without affecting my right to future care or treatment, and that the Practitioner or I may terminate the telemedicine visit at any time. I understand that I have the right to inspect all information obtained and/or recorded in the course of the telemedicine visit and may receive copies of available information for a reasonable fee.  I understand that some of the potential risks of receiving the Services via telemedicine include:  Delay or interruption in medical evaluation due to technological equipment failure or disruption; Information transmitted may not be sufficient (e.g. poor  resolution of images) to allow for appropriate medical decision making by the Practitioner; and/or  In rare instances, security protocols could fail, causing a breach of personal health information.  Furthermore, I acknowledge that it is my responsibility to provide information about my medical history, conditions and care that is complete and accurate to the best of my ability. I acknowledge that Practitioner's advice, recommendations, and/or decision may be based on factors not within their control, such as incomplete or inaccurate data provided by me or distortions of diagnostic images or specimens that may result from electronic transmissions. I understand that the practice of medicine is not an exact science and that Practitioner makes no warranties or guarantees regarding treatment outcomes. I acknowledge that a copy of this consent can be made available to me via my patient portal [facility] MyChart), or I can request a printed copy by calling the office of [facility].    I understand that my insurance will be billed for this visit.   I have read or had this consent read to me. I understand the contents of this consent, which adequately explains the benefits and risks of the Services being provided via telemedicine.  I have been provided ample opportunity to ask questions regarding this consent and the Services and have had my questions answered to my satisfaction. I give my informed consent for the services to be provided through the use of telemedicine in my medical care

## 2024-03-04 NOTE — Telephone Encounter (Signed)
 Left message to call back to scheduled tele appt to be done this week per preop APP today.

## 2024-03-04 NOTE — Progress Notes (Addendum)
 Spoke with Jahiem Franzoni Stanley  at Dr. Meridee Score office. Per Shanda Bumps Ward PA-C pt. Needs cardiac clearance for upcoming procedure on 03-11-24. Gwenda Heiner Stanley stated she will give this information to Dr. Meridee Score nurse.

## 2024-03-05 ENCOUNTER — Telehealth: Payer: Self-pay

## 2024-03-05 NOTE — Telephone Encounter (Signed)
 Procedure:03/11/24 Procedure date: endoscopy Procedure location: [facility] Arrival Time: 7:30 Spoke with the patient Y/N: Y Any prep concerns? N  Has the patient obtained the prep from the pharmacy ? Y Do you have a care partner and transportation: Y Any additional concerns? N

## 2024-03-06 ENCOUNTER — Ambulatory Visit: Attending: Physician Assistant

## 2024-03-06 DIAGNOSIS — Z01810 Encounter for preprocedural cardiovascular examination: Secondary | ICD-10-CM

## 2024-03-06 NOTE — Progress Notes (Signed)
 Virtual Visit via Telephone Note   Because of Autumn Fitzgerald co-morbid illnesses, she is at least at moderate risk for complications without adequate follow up.  This format is felt to be most appropriate for this patient at this time.  Due to technical limitations with video connection (technology), today's appointment will be conducted as an audio only telehealth visit, and Autumn Fitzgerald verbally agreed to proceed in this manner.   All issues noted in this document were discussed and addressed.  No physical exam could be performed with this format.  Evaluation Performed:  Preoperative cardiovascular risk assessment _____________   Date:  03/06/2024   Patient ID:  Autumn Fitzgerald, DOB 1946/01/17, MRN [id number] Patient Location:  Home Provider location:   Office  Primary Care Provider:  [name], MD Primary Cardiologist:  Thurmon Fair, MD  Chief Complaint / Patient Profile   78 y.o. y/o female with a h/o HOCM s/p septal myectomy in 2005, GERD, HTN, HLD, hx of near syncope s/p loop recorder that was explanted in 2017, hypothyroidism, LBBB and prediabetes, meningioma, aortic atherosclerosis with normal caliber aorta who is pending upper endoscopy and presents today for telephonic preoperative cardiovascular risk assessment.  History of Present Illness    Autumn Fitzgerald is a 78 y.o. female who presents via audio/video conferencing for a telehealth visit today.  Pt was last seen in cardiology clinic on 11/19/2023 by Dr. [name].  At that time Autumn Fitzgerald was doing well.  The patient is now pending procedure as outlined above. Since her last visit, she tells me her biggest issue is stomach discomfort especially after she eats.  This pain does not resolve with rest.  She tells me she was diagnosed with IBS.  No chest pain and no palpitations.  She does have some shortness of breath when completing indoor activities like dusting or vacuuming but this is nothing new for her.  She  usually sits down and rests and it resolves.  No medications indicated as needing held.  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)    Hypothyroidism    followed by pcp   LBBB (left bundle branch block)    Mild persistent allergic asthma    no longer   Palpitations    Panic attacks    Pneumonia    Pre-diabetes    Tricuspid regurgitation 07/06/2022   severe   Wears glasses    Past Surgical History:  Procedure Laterality Date   CARDIAC SURGERY  02/05/2007   in [place]   Septal Myectomy  for HOCM   CATARACT EXTRACTION W/ INTRAOCULAR LENS  IMPLANT, BILATERAL  right 07-23-2017;  left 10-15-2017   COLONOSCOPY N/A 11/24/2013   Procedure: COLONOSCOPY;  Surgeon: Charna Elizabeth, MD;  Location: [facility];  Service: Endoscopy;  Laterality: N/A;   DOPPLER ECHOCARDIOGRAPHY  03/14/2013   EF 65-70%,trivial AI,mild-mod. MR,Mod. TR,mod.  concentric hypertrophy   EP IMPLANTABLE DEVICE N/A 08/22/2016   Procedure: Loop Recorder Removal;  Surgeon: Thurmon Fair, MD;  Location: [facility];  Service: Cardiovascular;  Laterality: N/A;   ESOPHAGOGASTRODUODENOSCOPY N/A 11/24/2013   Procedure: ESOPHAGOGASTRODUODENOSCOPY (EGD);  Surgeon: Charna Elizabeth, MD;  Location: [facility];  Service: Endoscopy;  Laterality: N/A;   GLAUCOMA SURGERY Bilateral 2019   "release pressure   KNEE ARTHROSCOPY Right 10/17/2013   Procedure: ARTHROSCOPY RIGHT KNEE;  Surgeon: Verlee Rossetti, MD;  Location: [facility];  Service: Orthopedics;   Laterality: Right;   KNEE ARTHROSCOPY Left 11/08/2022   Procedure: ARTHROSCOPY KNEE; MEDIAL AND LATERAL MENISECTOMIES, CHONDROPLASTY;  Surgeon: Beverely Low, MD;  Location: [facility];  Service: Orthopedics;  Laterality: Left;  60 min   LAPAROSCOPIC CHOLECYSTECTOMY  02-03-2010   dr d blackman  @[facility]    LOOP RECORDER IMPLANT  03/18/2013   LOOP RECORDER IMPLANT N/A 03/18/2013   Procedure: LOOP RECORDER IMPLANT;  Surgeon: Thurmon Fair, MD;  Location: [facility];  Service: Cardiovascular;  Laterality: N/A;   SHOULDER ARTHROSCOPY WITH ROTATOR CUFF REPAIR Right 01/16/2019   Procedure: Right shoulder arthroscopy with subacromial decompression, distal clavicle resection, rotator cuff repair, bicep tenotomy;  Surgeon: Francena Hanly, MD;  Location: [facility];  Service: Orthopedics;  Laterality: Right;   TUBAL LIGATION  yrs ago    Allergies  Allergies  Allergen Reactions   Amoxicillin-Pot Clavulanate Other (See Comments)    Severe pains and GI symptoms Did it involve swelling of the face/tongue/throat, SOB, or low BP? No Did it involve sudden or severe rash/hives, skin peeling, or any reaction on the inside of your mouth or nose? No Did you need to seek medical attention at a hospital or doctor's office? No When did it last happen?      within the past 10 years If all above answers are "NO", may proceed with cephalosporin use.    Temazepam Other (See Comments)    Dizziness and doesn't work for patient   Lipitor [Atorvastatin Calcium]     Muscle pain   Amoxicillin Nausea And Vomiting   Lansoprazole Palpitations    "Body felt sick"    Penicillin G Other (See Comments)    Unknown reaction     Home Medications    Prior to Admission medications   Medication Sig Start Date End Date Taking? Authorizing Provider  acetaminophen (TYLENOL) 325 MG tablet Take 650 mg by mouth every 6 (six) hours as needed.    [provider]  albuterol (VENTOLIN HFA) 108 (90 Base) MCG/ACT inhaler Inhale 2 puffs into  the lungs every 4 (four) hours as needed for wheezing or shortness of breath. 02/19/24   Kozlow, Alvira Philips, MD  atenolol (TENORMIN) 25 MG tablet Take 0.5 tablets (12.5 mg total) by mouth daily. 12/03/19   Croitoru, Mihai, MD  Boswellia-Glucosamine-Vit D (OSTEO BI-FLEX ONE PER DAY PO) Take 1 tablet by mouth.    [provider]  Cholecalciferol 50 MCG (2000 UT) TABS Take 2,000 Units by mouth daily.    [provider]  clobetasol ointment (TEMOVATE) 0.05 % Apply 1 Application topically 3 (three) times a week. 10/01/23   Constant, Peggy, MD  clonazePAM (KLONOPIN) 2 MG tablet Take 2 mg by mouth daily as needed. 11/15/22   [provider]  dicyclomine (BENTYL) 20 MG tablet Take 20 mg by mouth daily at 6 (six) AM. 11/02/23   [provider]  ezetimibe-simvastatin (VYTORIN) 10-40 MG per tablet Take 1 tablet by mouth every evening.  [provider]  fluticasone (FLONASE) 50 MCG/ACT nasal spray Place 1 spray into both nostrils daily. 02/19/24   Kozlow, Alvira Philips, MD  Fluticasone Furoate (ARNUITY ELLIPTA) 100 MCG/ACT AEPB Inhale 1 puff into the lungs every morning. 02/19/24   Kozlow, Alvira Philips, MD  levothyroxine (SYNTHROID) 50 MCG tablet Take 50 mcg by mouth daily.  06/15/14   [provider]  Multiple Vitamin (MULTIVITAMIN) tablet Take 1 tablet by mouth daily.    [provider]  pantoprazole (PROTONIX) 40 MG tablet Take 1 tablet (40 mg total) by mouth in the morning. 02/19/24   Kozlow, Alvira Philips, MD  Polyethyl Glycol-Propyl Glycol 0.4-0.3 % SOLN Place one drop in both eyes daily as needed 01/04/21   Kozlow, Alvira Philips, MD    Physical Exam    Vital Signs:  Autumn Fitzgerald does not have vital signs available for review today.  Given telephonic nature of communication, physical exam is limited. AAOx3. NAD. Normal affect.  Speech and respirations are unlabored.  Accessory Clinical Findings    None  Assessment & Plan    1.  Preoperative Cardiovascular Risk  Assessment:   Autumn Fitzgerald perioperative risk of a major cardiac event is 0.9% according to the Revised Cardiac Risk Index (RCRI).  Therefore, she is at low risk for perioperative complications.   Her functional capacity is good at 5.07 METs according to the Duke Activity Status Index (DASI). Recommendations: According to ACC/AHA guidelines, no further cardiovascular testing needed.  The patient may proceed to surgery at acceptable risk.     The patient was advised that if she develops new symptoms prior to surgery to contact our office to arrange for a follow-up visit, and she verbalized understanding.   A copy of this note will be routed to requesting surgeon.  Time:   Today, I have spent 5 minutes with the patient with telehealth technology discussing medical history, symptoms, and management plan.     Sharlene Dory, PA-C  03/06/2024, 10:23 AM

## 2024-03-10 NOTE — Anesthesia Preprocedure Evaluation (Addendum)
 Anesthesia Evaluation  Patient identified by MRN, date of birth, ID band Patient awake    Reviewed: Allergy & Precautions, NPO status , Patient's Chart, lab work & pertinent test results  Airway Mallampati: II  TM Distance: >3 FB Neck ROM: Full    Dental no notable dental hx. (+) Teeth Intact, Dental Advisory Given   Pulmonary asthma    Pulmonary exam normal breath sounds clear to auscultation       Cardiovascular hypertension, (-) angina + CABG and +CHF  (-) Past MI Normal cardiovascular exam+ dysrhythmias  Rhythm:Regular Rate:Normal  06/2022 [facility] Hyperdynamic contraction, HOCM post septal myomectomy - peak velocity  2.7 m/s, PG 28 mmHg with Valsalva. Left ventricular ejection fraction, by  estimation, is >75%. The left ventricle has hyperdynamic function. The  left ventricle has no regional wall   motion abnormalities. There is mild left ventricular hypertrophy. Left  ventricular diastolic parameters are consistent with Grade I diastolic  dysfunction (impaired relaxation). The average left ventricular global  longitudinal strain is -19.7 %. The  global longitudinal strain is normal.   2. Right ventricular systolic function is normal. The right ventricular  size is normal. There is normal pulmonary artery systolic pressure. The  estimated right ventricular systolic pressure is 24.7 mmHg.   3. No SAM. The mitral valve is normal in structure. Mild mitral valve  regurgitation. No evidence of mitral stenosis.   4. Tricuspid valve regurgitation is severe.   5. The aortic valve is normal in structure. Aortic valve regurgitation is  mild. No aortic stenosis is present.   6. Pulmonic valve regurgitation is moderate.   7. The inferior vena cava is normal in size with greater than 50%  respiratory variability, suggesting right atrial pressure of 3 mmHg.      Neuro/Psych  Headaches  Anxiety Depression       GI/Hepatic ,GERD  ,,   Endo/Other  Hypothyroidism    Renal/GU      Musculoskeletal  (+) Arthritis ,    Abdominal   Peds  Hematology   Anesthesia Other Findings See list  Reproductive/Obstetrics                             Anesthesia Physical Anesthesia Plan  ASA: 3  Anesthesia Plan: MAC   Post-op Pain Management:    Induction:   PONV Risk Score and Plan: 2 and Treatment may vary due to age or medical condition and Propofol infusion  Airway Management Planned: Natural Airway and Nasal Cannula  Additional Equipment: None  Intra-op Plan:   Post-operative Plan:   Informed Consent: I have reviewed the patients History and Physical, chart, labs and discussed the procedure including the risks, benefits and alternatives for the proposed anesthesia with the patient or authorized representative who has indicated his/her understanding and acceptance.     Dental advisory given  Plan Discussed with: CRNA and Surgeon  Anesthesia Plan Comments: (Abnormal ct Abdominal pain wt loss)       Anesthesia Quick Evaluation

## 2024-03-11 ENCOUNTER — Other Ambulatory Visit: Payer: Self-pay

## 2024-03-11 ENCOUNTER — Ambulatory Visit (HOSPITAL_COMMUNITY): Payer: Self-pay | Admitting: Anesthesiology

## 2024-03-11 ENCOUNTER — Ambulatory Visit (HOSPITAL_BASED_OUTPATIENT_CLINIC_OR_DEPARTMENT_OTHER): Payer: Self-pay | Admitting: Anesthesiology

## 2024-03-11 ENCOUNTER — Encounter (HOSPITAL_COMMUNITY)
Admission: RE | Disposition: A | Discharge status: Home / Self Care | Payer: Self-pay | Source: Home / Self Care | Attending: Gastroenterology

## 2024-03-11 ENCOUNTER — Ambulatory Visit (HOSPITAL_COMMUNITY)
Admission: RE | Admit: 2024-03-11 | Discharge: 2024-03-11 | Disposition: A | Discharge status: Home / Self Care | Payer: Medicare HMO | Attending: Gastroenterology | Admitting: Gastroenterology

## 2024-03-11 ENCOUNTER — Encounter (HOSPITAL_COMMUNITY): Payer: Self-pay | Admitting: Gastroenterology

## 2024-03-11 DIAGNOSIS — K317 Polyp of stomach and duodenum: Secondary | ICD-10-CM | POA: Diagnosis not present

## 2024-03-11 DIAGNOSIS — E039 Hypothyroidism, unspecified: Secondary | ICD-10-CM | POA: Diagnosis not present

## 2024-03-11 DIAGNOSIS — R140 Abdominal distension (gaseous): Secondary | ICD-10-CM | POA: Diagnosis not present

## 2024-03-11 DIAGNOSIS — M1990 Unspecified osteoarthritis, unspecified site: Secondary | ICD-10-CM | POA: Diagnosis not present

## 2024-03-11 DIAGNOSIS — K449 Diaphragmatic hernia without obstruction or gangrene: Secondary | ICD-10-CM

## 2024-03-11 DIAGNOSIS — R1084 Generalized abdominal pain: Secondary | ICD-10-CM

## 2024-03-11 DIAGNOSIS — Z951 Presence of aortocoronary bypass graft: Secondary | ICD-10-CM | POA: Insufficient documentation

## 2024-03-11 DIAGNOSIS — Z8249 Family history of ischemic heart disease and other diseases of the circulatory system: Secondary | ICD-10-CM | POA: Insufficient documentation

## 2024-03-11 DIAGNOSIS — R933 Abnormal findings on diagnostic imaging of other parts of digestive tract: Secondary | ICD-10-CM

## 2024-03-11 DIAGNOSIS — K2289 Other specified disease of esophagus: Secondary | ICD-10-CM

## 2024-03-11 DIAGNOSIS — K30 Functional dyspepsia: Secondary | ICD-10-CM | POA: Diagnosis not present

## 2024-03-11 DIAGNOSIS — R410 Disorientation, unspecified: Secondary | ICD-10-CM | POA: Diagnosis present

## 2024-03-11 DIAGNOSIS — Z7722 Contact with and (suspected) exposure to environmental tobacco smoke (acute) (chronic): Secondary | ICD-10-CM | POA: Insufficient documentation

## 2024-03-11 DIAGNOSIS — K3189 Other diseases of stomach and duodenum: Secondary | ICD-10-CM | POA: Diagnosis not present

## 2024-03-11 DIAGNOSIS — R634 Abnormal weight loss: Secondary | ICD-10-CM

## 2024-03-11 DIAGNOSIS — I509 Heart failure, unspecified: Secondary | ICD-10-CM

## 2024-03-11 DIAGNOSIS — I110 Hypertensive heart disease with heart failure: Secondary | ICD-10-CM

## 2024-03-11 DIAGNOSIS — R9389 Abnormal findings on diagnostic imaging of other specified body structures: Secondary | ICD-10-CM

## 2024-03-11 DIAGNOSIS — R1013 Epigastric pain: Secondary | ICD-10-CM

## 2024-03-11 DIAGNOSIS — I899 Noninfective disorder of lymphatic vessels and lymph nodes, unspecified: Secondary | ICD-10-CM | POA: Diagnosis not present

## 2024-03-11 DIAGNOSIS — R935 Abnormal findings on diagnostic imaging of other abdominal regions, including retroperitoneum: Secondary | ICD-10-CM | POA: Diagnosis not present

## 2024-03-11 HISTORY — PX: EUS: SHX5427

## 2024-03-11 HISTORY — DX: Anemia, unspecified: D64.9

## 2024-03-11 HISTORY — PX: ESOPHAGOGASTRODUODENOSCOPY: SHX5428

## 2024-03-11 HISTORY — PX: POLYPECTOMY: SHX5525

## 2024-03-11 SURGERY — UPPER ENDOSCOPIC ULTRASOUND (EUS) RADIAL
Anesthesia: Monitor Anesthesia Care

## 2024-03-11 MED ORDER — PROPOFOL 1000 MG/100ML IV EMUL
INTRAVENOUS | Status: AC
  Filled 2024-03-11: qty 100

## 2024-03-11 MED ORDER — PANTOPRAZOLE SODIUM 40 MG PO TBEC: 40.0000 mg | DELAYED_RELEASE_TABLET | Freq: Two times a day (BID) | ORAL | 0 refills | Status: DC

## 2024-03-11 MED ORDER — PROPOFOL 10 MG/ML IV BOLUS
INTRAVENOUS | Status: DC | PRN
  Administered 2024-03-11 (×5): 20 mg via INTRAVENOUS

## 2024-03-11 MED ORDER — LIDOCAINE 2% (20 MG/ML) 5 ML SYRINGE
INTRAMUSCULAR | Status: DC | PRN
  Administered 2024-03-11: 100 mg via INTRAVENOUS

## 2024-03-11 MED ORDER — PROPOFOL 500 MG/50ML IV EMUL
INTRAVENOUS | Status: DC | PRN
  Administered 2024-03-11: 125 ug/kg/min via INTRAVENOUS

## 2024-03-11 MED ORDER — SODIUM CHLORIDE 0.9 % IV SOLN
INTRAVENOUS | Status: DC
Start: 2024-03-11 — End: 2024-03-11

## 2024-03-11 NOTE — Discharge Instructions (Signed)

## 2024-03-11 NOTE — Anesthesia Postprocedure Evaluation (Signed)
 Anesthesia Post Note  Patient: Autumn Fitzgerald  Procedure(s) Performed: UPPER ENDOSCOPIC ULTRASOUND (EUS) RADIAL POLYPECTOMY     Patient location during evaluation: PACU Anesthesia Type: MAC Level of consciousness: awake and alert Pain management: pain level controlled Vital Signs Assessment: post-procedure vital signs reviewed and stable Respiratory status: spontaneous breathing, nonlabored ventilation and respiratory function stable Cardiovascular status: blood pressure returned to baseline and stable Postop Assessment: no apparent nausea or vomiting Anesthetic complications: no   No notable events documented.  Last Vitals:  Vitals:   03/11/24 1000 03/11/24 1010  BP: 127/80 128/82  Pulse: 94 89  Resp: (!) 28 20  Temp:    SpO2: 99% 99%    Last Pain:  Vitals:   03/11/24 1010  TempSrc:   PainSc: 0-No pain                 Lannie Fields

## 2024-03-11 NOTE — H&P (Signed)
 GASTROENTEROLOGY PROCEDURE H&P NOTE   Primary Care Physician: [name], MD  HPI: Autumn Fitzgerald is a 78 y.o. female who presents for EGD/EUS to evaluate gastric lining with abnormal CT and for continued epigastric abdominal pain.  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)    Hypothyroidism    followed by pcp   LBBB (left bundle branch block)    Mild persistent allergic asthma    no longer   Palpitations    Panic attacks    Pneumonia    Pre-diabetes    Tricuspid regurgitation 07/06/2022   severe   Wears glasses    Past Surgical History:  Procedure Laterality Date   CARDIAC SURGERY  02/05/2007   in [place]   Septal Myectomy  for HOCM   CATARACT EXTRACTION W/ INTRAOCULAR LENS  IMPLANT, BILATERAL  right 07-23-2017;  left 10-15-2017   COLONOSCOPY N/A 11/24/2013   Procedure: COLONOSCOPY;  Surgeon: Charna Elizabeth, MD;  Location: [facility];  Service: Endoscopy;  Laterality: N/A;   DOPPLER ECHOCARDIOGRAPHY  03/14/2013   EF 65-70%,trivial AI,mild-mod. MR,Mod. TR,mod. concentric hypertrophy   EP IMPLANTABLE DEVICE N/A 08/22/2016   Procedure: Loop Recorder Removal;  Surgeon: Thurmon Fair, MD;  [facility];  Service: Cardiovascular;  Laterality: N/A;   ESOPHAGOGASTRODUODENOSCOPY N/A 11/24/2013   Procedure: ESOPHAGOGASTRODUODENOSCOPY (EGD);  Surgeon: Charna Elizabeth, MD;  Location: [facility];  Service: Endoscopy;  Laterality: N/A;   GLAUCOMA SURGERY Bilateral 2019   "release pressure   KNEE ARTHROSCOPY  Right 10/17/2013   Procedure: ARTHROSCOPY RIGHT KNEE;  Surgeon: Verlee Rossetti, MD;  Location: [facility];  Service: Orthopedics;  Laterality: Right;   KNEE ARTHROSCOPY Left 11/08/2022   Procedure: ARTHROSCOPY KNEE; MEDIAL AND LATERAL MENISECTOMIES, CHONDROPLASTY;  Surgeon: Beverely Low, MD;  Location: [facility];  Service: Orthopedics;  Laterality: Left;  60 min   LAPAROSCOPIC CHOLECYSTECTOMY  02-03-2010   dr d blackman  @[facility]    LOOP RECORDER IMPLANT  03/18/2013   LOOP RECORDER IMPLANT N/A 03/18/2013   Procedure: LOOP RECORDER IMPLANT;  Surgeon: Thurmon Fair, MD;  Location: [facility];  Service: Cardiovascular;  Laterality: N/A;   SHOULDER ARTHROSCOPY WITH ROTATOR CUFF REPAIR Right 01/16/2019   Procedure: Right shoulder arthroscopy with subacromial decompression, distal clavicle resection, rotator cuff repair, bicep tenotomy;  Surgeon: Francena Hanly, MD;  Location: [facility];  Service: Orthopedics;  Laterality: Right;   TUBAL LIGATION  yrs ago   Current Facility-Administered Medications  Medication Dose Route Frequency Provider Last Rate Last Admin   0.9 %  sodium chloride infusion   Intravenous Continuous Mansouraty, Netty Starring., MD        Current Facility-Administered Medications:    0.9 %  sodium chloride infusion, , Intravenous, Continuous, Mansouraty, Netty Starring., MD Allergies  Allergen Reactions   Amoxicillin-Pot Clavulanate Other (See Comments)    Severe pains and GI symptoms Did it involve swelling of the face/tongue/throat, SOB, or  low BP? No Did it involve sudden or severe rash/hives, skin peeling, or any reaction on the inside of your mouth or nose? No Did you need to seek medical attention at a hospital or doctor's office? No When did it last happen?      within the past 10 years If all above answers are "NO", may proceed with cephalosporin use.    Temazepam Other (See Comments)    Dizziness and doesn't work for patient   Lipitor [Atorvastatin Calcium]     Muscle pain   Amoxicillin  Nausea And Vomiting   Lansoprazole Palpitations    "Body felt sick"    Penicillin G Other (See Comments)    Unknown reaction    Family History  Problem Relation Age of Onset   Cancer Mother        breast   Hypertension Mother    Breast cancer Mother    Cancer Father        prostate   Diabetes Sister    Heart attack Sister    Cancer Brother    Cancer Son    Liver disease Neg Hx    Colon cancer Neg Hx    Esophageal cancer Neg Hx    Stomach cancer Neg Hx    Rectal cancer Neg Hx    Social History   Socioeconomic History   Marital status: Divorced    Spouse name: Not on file   Number of children: 5   Years of education: 10   Highest education level: Not on file  Occupational History   Occupation: retired  Tobacco Use   Smoking status: Never    Passive exposure: Past   Smokeless tobacco: Never  Vaping Use   Vaping status: Never Used  Substance and Sexual Activity   Alcohol use: No   Drug use: No   Sexual activity: Never    Birth control/protection: Post-menopausal, Surgical    Comment: divorced  Other Topics Concern   Not on file  Social History Narrative   Lives at home by herself.    Caffeine use: none    Social Drivers of Corporate investment banker Strain: Not on file  Food Insecurity: Not on file  Transportation Needs: Not on file  Physical Activity: Not on file  Stress: Not on file  Social Connections: Not on file  Intimate Partner Violence: Not on file    Physical Exam: There were no vitals filed for this visit. There is no height or weight on file to calculate BMI. GEN: NAD EYE: Sclerae anicteric ENT: MMM CV: Non-tachycardic GI: Soft, NT/ND NEURO:  Alert & Oriented x 3  Lab Results: No results for input(s): "WBC", "HGB", "HCT", "PLT" in the last 72 hours. BMET No results for input(s): "NA", "K", "CL", "CO2", "GLUCOSE", "BUN", "CREATININE", "CALCIUM" in the last 72 hours. LFT No results for input(s): "PROT", "ALBUMIN", "AST", "ALT",  "ALKPHOS", "BILITOT", "BILIDIR", "IBILI" in the last 72 hours. PT/INR No results for input(s): "LABPROT", "INR" in the last 72 hours.   Impression / Plan: This is a 78 y.o.female who presents for EGD/EUS to evaluate gastric lining with abnormal CT and for continued epigastric abdominal pain.   The risks and benefits of endoscopic evaluation/treatment were discussed with the patient and/or family; these include but are not limited to the risk of perforation, infection, bleeding, missed lesions, lack of diagnosis, severe illness requiring hospitalization, as well as anesthesia and sedation related illnesses.  The patient's history has been reviewed, patient examined, no change  in status, and deemed stable for procedure.  The patient and/or family is agreeable to proceed.    Corliss Parish, MD [facility] Office # [phone]

## 2024-03-11 NOTE — Transfer of Care (Signed)
 Immediate Anesthesia Transfer of Care Note  Patient: Autumn Fitzgerald  Procedure(s) Performed: UPPER ENDOSCOPIC ULTRASOUND (EUS) RADIAL POLYPECTOMY  Patient Location: Endoscopy Unit  Anesthesia Type:MAC  Level of Consciousness: drowsy  Airway & Oxygen Therapy: Patient Spontanous Breathing and Patient connected to face mask oxygen  Post-op Assessment: Report given to RN and Post -op Vital signs reviewed and stable  Post vital signs: Reviewed and stable  Last Vitals:  Vitals Value Taken Time  BP    Temp    Pulse    Resp    SpO2      Last Pain:  Vitals:   03/11/24 0833  TempSrc: Temporal  PainSc: 0-No pain         Complications: No notable events documented.

## 2024-03-11 NOTE — Op Note (Signed)
 [facility] Patient Name: Autumn Fitzgerald Procedure Date: 03/11/2024 MRN: [id number] Attending MD: Corliss Parish , MD, [id number] Date of Birth: 11-27-1946 CSN: [id number] Age: 78 Admit Type: Inpatient Procedure:                Upper EUS Indications:              Abnormal abdominal/pelvic CT scan, Functional                            Dyspepsia, Abdominal bloating Providers:                Corliss Parish, MD, Suzy Bouchard, RN, Salley Scarlet, Technician, Leroy Libman, CRNA Referring MD:              Medicines:                Monitored Anesthesia Care Complications:            No immediate complications. Estimated Blood Loss:     Estimated blood loss was minimal. Procedure:                Pre-Anesthesia Assessment:                           - Prior to the procedure, a History and Physical                            was performed, and patient medications and                            allergies were reviewed. The patient's tolerance of                            previous anesthesia was also reviewed. The risks                            and benefits of the procedure and the sedation                            options and risks were discussed with the patient.                            All questions were answered, and informed consent                            was obtained. Prior Anticoagulants: The patient has                            taken no anticoagulant or antiplatelet agents. ASA                            Grade Assessment: III - A patient with severe  systemic disease. After reviewing the risks and                            benefits, the patient was deemed in satisfactory                            condition to undergo the procedure.                           After obtaining informed consent, the endoscope was                            passed under direct vision. Throughout the                             procedure, the patient's blood pressure, pulse, and                            oxygen saturations were monitored continuously. The                            [id number]-H190 ([id number] Olympus endoscope was introduced                            through the mouth, and advanced to the second part                            of duodenum. The GF-UE190-AL5 ([id number]) Olympus                            radial ultrasound scope was introduced through the                            mouth, and advanced to the duodenum for ultrasound                            examination from the stomach and duodenum. The                            GF-[id number] ([id number]) Olympus linear ultrasound scope                            was introduced through the mouth, and advanced to                            the duodenum for ultrasound examination from the                            stomach and duodenum. The upper EUS was                            accomplished without difficulty. The patient  tolerated the procedure. Scope In: Scope Out: Findings:      ENDOSCOPIC FINDING: :      No gross lesions were noted in the entire esophagus.      The Z-line was irregular and was found 35 cm from the incisors.      A 1 cm hiatal hernia was present.      Greater than 30, 4 to 10 mm semi-sessile polyps were found in the       cardia, in the gastric fundus and in the gastric body. 2 of these polyps       showed evidence of recent oozing/bleeding. The rest showed no stigmata       of recent bleeding. The 2 polyps were removed with a cold snare.       Resection and retrieval were complete.      Striped mildly erythematous mucosa without bleeding was found in the       gastric antrum.      No other gross lesions were noted in the entire examined stomach (this       is previously been biopsied and negative for H. pylori within the last       few months or not redone).      No gross lesions were noted in the duodenal  bulb, in the first portion       of the duodenum and in the second portion of the duodenum. Biopsies were       taken with a cold forceps for histology to rule out enteropathy.      ENDOSONOGRAPHIC FINDING: :      Endosonographic images of the stomach were unremarkable. No masses and       no wall thickening were identified. The gastric cardia wall measured 3.7       mm, the gastric proximal body wall measured 3.2 mm, the gastric distal       body wall measured 3.5 mm, the gastric antrum wall measured 2.8 mm.      Endosonographic imaging in the pancreatic head (PD = 1.4 mm), genu of       the pancreas (PD = 1.5 mm), pancreatic body (PD = 1.1 mm) and pancreatic       tail (PD = 0.5 mm) showed no mass, pancreatic duct changes or       parenchymal abnormalities.      There was no sign of significant endosonographic abnormality in the       common bile duct (4.4 mm) and in the common hepatic duct (5.1 mm). No       stones and ducts with regular contour were identified.      Endosonographic imaging of the ampulla showed no intramural       (subepithelial) lesion.      Endosonographic imaging in the visualized portion of the liver showed no       mass.      No malignant-appearing lymph nodes were visualized in the celiac region       (level 20), perigastric region, peripancreatic region and porta hepatis       region.      The celiac region was visualized. Impression:               EGD impression:                           - No gross lesions in the entire esophagus. Z-line  irregular, 35 cm from the incisors.                           - 1 cm hiatal hernia.                           - Multiple gastric polyps. 2 with evidence of                            recent oozing/bleeding were resected and retrieved.                           - Erythematous mucosa in the antrum. No other gross                            lesions in the entire stomach. The stomach is                             previously been biopsied for HP and was negative,                            so not redone.                           - No gross lesions in the duodenal bulb, in the                            first portion of the duodenum and in the second                            portion of the duodenum. Biopsied.                           EUS impression:                           - Endosonographic images of the stomach were                            unremarkable. I could not visualize significant                            abnormality in the wall of the stomach.                           - Endosonographic images of the pancreas showed no                            evidence of a mass or lesion with a normal pancreas                            duct throughout.                           - There  was no sign of significant pathology in the                            common bile duct and in the common hepatic duct.                           - No malignant-appearing lymph nodes were                            visualized in the celiac region (level 20),                            perigastric region, peripancreatic region and porta                            hepatis region. Moderate Sedation:      Not Applicable - Patient had care per Anesthesia. Recommendation:           - The patient will be observed post-procedure,                            until all discharge criteria are met.                           - Discharge patient to home.                           - Patient has a contact number available for                            emergencies. The signs and symptoms of potential                            delayed complications were discussed with the                            patient. Return to normal activities tomorrow.                            Written discharge instructions were provided to the                            patient.                           - Resume previous diet.                            - Increase Protonix to 40 mg twice daily for 1                            month then may decrease to 40 mg daily as his                            normal for her (new  prescription for twice daily                            dosing was sent to pharmacy).                           - Observe patient's clinical course.                           - Await path results.                           - The findings and recommendations were discussed                            with the patient.                           - The findings and recommendations were discussed                            with the designated responsible adult. Procedure Code(s):        --- Professional ---                           [phone], Esophagogastroduodenoscopy, flexible,                            transoral; with removal of tumor(s), polyp(s), or                            other lesion(s) by snare technique                           43237, Esophagogastroduodenoscopy, flexible,                            transoral; with endoscopic ultrasound examination                            limited to the esophagus, stomach or duodenum, and                            adjacent structures                           43239, 59, Esophagogastroduodenoscopy, flexible,                            transoral; with biopsy, single or multiple Diagnosis Code(s):        --- Professional ---                           K22.89, Other specified disease of esophagus                           K44.9, Diaphragmatic hernia without obstruction or  gangrene                           K31.7, Polyp of stomach and duodenum                           K31.89, Other diseases of stomach and duodenum                           I89.9, Noninfective disorder of lymphatic vessels                            and lymph nodes, unspecified                           K30, Functional dyspepsia                           R14.0, Abdominal  distension (gaseous)                           R93.5, Abnormal findings on diagnostic imaging of                            other abdominal regions, including retroperitoneum CPT copyright 2022 [employer]. All rights reserved. The codes documented in this report are preliminary and upon coder review may  be revised to meet current compliance requirements. Corliss Parish, MD 03/11/2024 10:03:03 AM Number of Addenda: 0

## 2024-03-12 ENCOUNTER — Encounter (HOSPITAL_COMMUNITY): Payer: Self-pay | Admitting: Gastroenterology

## 2024-03-12 LAB — SURGICAL PATHOLOGY

## 2024-03-13 ENCOUNTER — Encounter: Payer: Self-pay | Admitting: Gastroenterology

## 2024-03-25 ENCOUNTER — Telehealth: Payer: Self-pay | Admitting: Gastroenterology

## 2024-03-25 NOTE — Telephone Encounter (Signed)
 I spoke with the pt and made her aware of the contents of the pathology letter below.    All her questions were answered to the best of my ability.      What does this all mean? The stomach polyps that we removed due to signs of recent bleeding returned as fundic gland polyps.  These are benign polyps are not precancerous.  They do not require follow-up.

## 2024-03-25 NOTE — Telephone Encounter (Signed)
 Inbound call from patient, states she would like to go over the results from her EUS. Patient states she has not received the letter in regards to results and would also like to know if the 30+ polyps that were found are going to be removed or the next steps in regards to that.

## 2024-04-25 ENCOUNTER — Telehealth: Payer: Self-pay | Admitting: Gastroenterology

## 2024-04-25 NOTE — Telephone Encounter (Signed)
 This is a Dr Venice Gillis pt- Dr Brice Campi only did the EUS.

## 2024-04-25 NOTE — Telephone Encounter (Signed)
 Returned call to patient & she is still having ongoing, intermittent generalized abdominal pain after eating, weakness, and weight loss. Most recent weight documented is 125lb, she's unsure of current weight. Denies any n/v. She mostly eats chicken, soup, and potatoes. She's taking 40 mg of pantoprazole  BID & bentyl  20 mg PRN. She does get some relief from the bentyl . She's also concerned about the number of polyps (32 or more) that was seen on EUS 03/21/24. She also stated at last OV a nutrition referral was discussed, but never placed. Advised her I'd follow up on referral & any further recommendations provider has and will be in touch. Last seen for OV with [place], PA 01/22/24.

## 2024-04-25 NOTE — Telephone Encounter (Signed)
 Pt made aware of PA recommendations. OV scheduled for 05/01/24 at 11:00 am with Dr. Venice Gillis.

## 2024-04-25 NOTE — Telephone Encounter (Signed)
 Patient requesting to speak with a nurses on regards to abdominal pain. Please advise.   Thank you

## 2024-04-25 NOTE — Telephone Encounter (Signed)
 Left message for patient to call back

## 2024-05-01 ENCOUNTER — Ambulatory Visit (INDEPENDENT_AMBULATORY_CARE_PROVIDER_SITE_OTHER): Admitting: Gastroenterology

## 2024-05-01 ENCOUNTER — Encounter: Payer: Self-pay | Admitting: Gastroenterology

## 2024-05-01 VITALS — BP 122/62 | HR 87 | Ht 61.0 in | Wt 125.0 lb

## 2024-05-01 DIAGNOSIS — R933 Abnormal findings on diagnostic imaging of other parts of digestive tract: Secondary | ICD-10-CM | POA: Diagnosis not present

## 2024-05-01 DIAGNOSIS — Z9049 Acquired absence of other specified parts of digestive tract: Secondary | ICD-10-CM

## 2024-05-01 DIAGNOSIS — R140 Abdominal distension (gaseous): Secondary | ICD-10-CM

## 2024-05-01 DIAGNOSIS — K581 Irritable bowel syndrome with constipation: Secondary | ICD-10-CM

## 2024-05-01 DIAGNOSIS — K317 Polyp of stomach and duodenum: Secondary | ICD-10-CM | POA: Diagnosis not present

## 2024-05-01 DIAGNOSIS — Z860100 Personal history of colon polyps, unspecified: Secondary | ICD-10-CM

## 2024-05-01 DIAGNOSIS — K589 Irritable bowel syndrome without diarrhea: Secondary | ICD-10-CM

## 2024-05-01 MED ORDER — PANTOPRAZOLE SODIUM 20 MG PO TBEC: 20.0000 mg | DELAYED_RELEASE_TABLET | Freq: Every day | ORAL | 4 refills | Status: AC

## 2024-05-01 NOTE — Progress Notes (Signed)
 Chief Complaint: FU  Referring Provider:  Charle Congo, MD      ASSESSMENT AND PLAN;   #1. Abn CT showing gastric wall thickening d/t fundic gland polyps. Neg EUS 03/2024. Neg EGD 10/2023 except for gastric polyps. H/O pyloric stenosis s/p dilatation to 14 mm 2018. Dx with functional dyspepsia. Neg eval for gastroparesis including GES, EGG 2019.  Previous cholecystectomy.  #2. IBS-C with bloating. Neg colon Nov 2024. Excerberated with stress.  Plan:  -FODMAP diet/nutritionist consult -Decrease protonix  20mg  po every day #90, 4RF -Miralax 17g po every day -Continue bentyl  20mg  po BID prn -I have reassured patient.  I am glad she is seen psychiatrist last week.   HPI:    Autumn Fitzgerald is a 78 y.o. female  hx of HOCM s/p septal myectomy in 2005, GERD, HTN, HLD, history of near syncope s/p loop recorder that was explanted in 2017, hypothyroidism, LBBB and prediabetes, meningioma, Nl EF (2DE-06/2022 >75%), anxiety, s/p cholecystectomy.  Discussed the use of AI scribe software for clinical note transcription with the patient, who gave verbal consent to proceed.  History of Present Illness Autumn Fitzgerald is a 78 year old female with IBS who presents with abdominal pain and dietary concerns.  She has a history of IBS with predominant constipation, managed with daily Miralax and dicyclomine  as needed for pain. Despite dietary modifications, including avoiding foods like ice cream that exacerbate her symptoms, she feels weak and is concerned about her nutritional intake. Her diet mainly consists of bananas, honey bars, plantains, chicken soup without onions, and white rice with chicken or ribs.  She has a history of gastric polyps, specifically fundic gland polyps, with more than thirty-two polyps identified during an endoscopic ultrasound. She is concerned about the potential risks of these polyps and believes that her long-term use of Protonix  for reflux, due to insurance issues with  other medications, may contribute to their presence. She also has a hernia in her stomach.  She experiences bloating, which she associates with her IBS, and believes stress contributes to her symptoms. She notes significant stress at home due to being alone and her four sons not visiting her. She has a history of anxiety, managed with clonazepam, and reports dizziness upon lying down and getting up, as well as frequent headaches and migraines. She mentions having a small tumor in her head.  Her abdominal pain dates back to 2015, with previous endoscopy and CT scan showing abdominal pain. She recalls visiting a doctor in 2018 but was not informed about having IBS at that time. She has been experiencing pain intermittently since then.  She weighs 125 pounds, which she notes has increased since she started eating chicken to combat feelings of weakness.    Wt Readings from Last 3 Encounters:  05/01/24 125 lb (56.7 kg)  03/11/24 124 lb (56.2 kg)  02/19/24 125 lb 9.6 oz (57 kg)     Seen in ED 08/31/2023 With epi pain, bloating. Nl CBC, CMP, lipase CT Abdo/pelvis which showed questionable  gastric wall thickening. advised EGD.   From prev notes: She has been having epi pain x since 2015, worse with eating.  Associated with bloating and early satiety.  She also has intermittent nausea.  No dysphagia.  Has seen several gastroenterologists with extensive GI workup.  -Seen by Dr. Tova Fresh had neg EGD in 2015, normal GES, negative CT Abdo/pelvis, negative hydrogen SIBO breath test 08/2017 -Seen by Dr. Squire Dyers underwent EGD 11/05/2017 which showed pyloric stenosis,  dilated to 14 mm.  Multiple hyperplastic gastric polyps.  She did not have any benefit after pyloric dilatation.  Underwent repeat GES on 01/03/2018 which was normal.  She also had normal EGD on 01/10/2018.  Diagnosed with functional dyspepsia.  Given trial of amitriptyline  25 mg p.o. nightly which she did not tolerate. -She has tried multiple  medications including probiotics, PPIs, IBgard, FD guard without any significant relief.   Also with longstanding history of constipation with colonoscopy 07/2018 showing small sessile polyps.  It was recommended to perform repeat colonoscopy in 5 years.  No jaundice dark urine or pale stools.    Past GI WU:  EUS 03/11/2024  EGD impression: - No gross lesions in the entire esophagus. Z- line irregular, 35 cm from the incisors. - 1 cm hiatal hernia. - Multiple gastric polyps. 2 with evidence of recent oozing/ bleeding were resected and retrieved. - Erythematous mucosa in the antrum. No other gross lesions in the entire stomach. The stomach is previously been biopsied for HP and was negative, so not redone. - No gross lesions in the duodenal bulb, EUS impression: - Endosonographic images of the stomach were unremarkable. I could not visualize significant abnormality in the wall of the stomach. - Endosonographic images of the pancreas showed no evidence of a mass or lesion with a normal pancreas duct throughout. - There was no sign of significant pathology in the common bile duct and in the common hepatic duct. - No malignant- appearing lymph nodes were visualized in the celiac region ( level 20) , perigastric region, peripancreatic region and porta hepatis region.  Bx: Neg SB Bx, Gastric polyps-fundic gland polyps     EGD 10/31/2023 - Normal esophagus. - Z- line regular, 35 cm from the incisors. - Multiple gastric polyps. Resected and retrieved x 3. - Normal examined duodenum. -Bx: neg for HP, fundic gland polyps  Colonoscopy 10/31/2023 - One 4 mm polyp in the distal sigmoid colon, removed with a cold snare. Resected and retrieved. - Non- bleeding internal hemorrhoids. - The examined portion of the ileum was normal. -Bx- neg. No need to rpt   CT AP with contrast 08/31/2023 No bowel obstruction, free air or free fluid. Normal appendix. Stomach is underdistended but there is some questionable  fold thickening. Please correlate with any particular symptoms. Fatty liver infiltration.  Previous cholecystectomy.  EGD 11/05/2017 (Dr Lera Rank): Pyloric stenosis, dil 14 mm. Multiple small gastric polyps  Colon 07/15/2018: (Dr. Lera Rank) Small sessile polyps s/p polypectomy.  I could not find biopsy results.  Repeat 5 years.  EGD 11/24/2013: Dr. Joanne Muckle polyps s/p polypectomy using hot snare, small HH.  Colonoscopy 11/24/2013: Dr. Parris Bolognese but normal.  Some stool in the colon.  Internal hemorrhoids.  Per report repeat 10 years.  MRI head with contrast 02/2022 1. Negative for retrocochlear lesion. 2. Mild mastoid opacification on the left more than right. 3. 9 mm high right frontal meningioma without worrisome change from 2016.  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)    Hypothyroidism    followed by pcp   LBBB (left bundle branch block)    Mild persistent allergic asthma    no longer   Palpitations    Panic attacks    Pneumonia    Pre-diabetes    Tricuspid regurgitation 07/06/2022   severe   Wears glasses     Past Surgical History:  Procedure Laterality Date   CARDIAC SURGERY  02/05/2007   in [place]    Septal Myectomy  for HOCM   CATARACT EXTRACTION W/ INTRAOCULAR LENS  IMPLANT, BILATERAL  right 07-23-2017;  left 10-15-2017   COLONOSCOPY N/A 11/24/2013   Procedure: COLONOSCOPY;  Surgeon: Tami Falcon, MD;  Location: [facility];  Service: Endoscopy;  Laterality: N/A;   DOPPLER  ECHOCARDIOGRAPHY  03/14/2013   EF 65-70%,trivial AI,mild-mod. MR,Mod. TR,mod. concentric hypertrophy   EP IMPLANTABLE DEVICE N/A 08/22/2016   Procedure: Loop Recorder Removal;  Surgeon: Luana Rumple, MD;  [facility]: [facility];  Service: Cardiovascular;  Laterality: N/A;   ESOPHAGOGASTRODUODENOSCOPY N/A 11/24/2013   Procedure: ESOPHAGOGASTRODUODENOSCOPY (EGD);  Surgeon: Tami Falcon, MD;  Location: [facility];  Service: Endoscopy;  Laterality: N/A;   ESOPHAGOGASTRODUODENOSCOPY N/A 03/11/2024   Procedure: EGD (ESOPHAGOGASTRODUODENOSCOPY);  Surgeon: Normie Becton., MD;  Location: [facility];  Service: Gastroenterology;  Laterality: N/A;   EUS N/A 03/11/2024   Procedure: UPPER ENDOSCOPIC ULTRASOUND (EUS) RADIAL;  Surgeon: Normie Becton., MD;  Location: [facility];  Service: Gastroenterology;  Laterality: N/A;   GLAUCOMA SURGERY Bilateral 2019   "release pressure   KNEE ARTHROSCOPY Right 10/17/2013   Procedure: ARTHROSCOPY RIGHT KNEE;  Surgeon: Lorriane Rote, MD;  Location: [facility];  Service: Orthopedics;  Laterality: Right;   KNEE ARTHROSCOPY Left 11/08/2022   Procedure: ARTHROSCOPY KNEE; MEDIAL AND LATERAL MENISECTOMIES, CHONDROPLASTY;  Surgeon: Winston Hawking, MD;  Location: [facility];  Service: Orthopedics;  Laterality: Left;  60 min   LAPAROSCOPIC CHOLECYSTECTOMY  02-03-2010   dr d blackman  @[facility]    LOOP RECORDER IMPLANT  03/18/2013   LOOP RECORDER IMPLANT N/A 03/18/2013   Procedure: LOOP RECORDER IMPLANT;  Surgeon: Luana Rumple, MD;  Location: [facility];  Service: Cardiovascular;  Laterality: N/A;   POLYPECTOMY  03/11/2024   Procedure: POLYPECTOMY;  Surgeon: Brice Campi Albino Alu., MD;  Location: [facility];  Service: Gastroenterology;;   SHOULDER ARTHROSCOPY WITH ROTATOR CUFF REPAIR Right 01/16/2019   Procedure: Right shoulder arthroscopy with subacromial decompression, distal clavicle resection, rotator cuff repair, bicep tenotomy;  Surgeon: Ellard Gunning, MD;  Location:  [facility];  Service: Orthopedics;  Laterality: Right;   TUBAL LIGATION  yrs ago    Family History  Problem Relation Age of Onset   Cancer Mother        breast   Hypertension Mother    Breast cancer Mother    Cancer Father        prostate   Diabetes Sister    Heart attack Sister    Cancer Brother    Cancer Son    Liver disease Neg Hx    Colon cancer Neg Hx    Esophageal cancer Neg Hx    Stomach cancer Neg Hx    Rectal cancer Neg Hx     Social History   Tobacco Use   Smoking status: Never    Passive exposure: Past   Smokeless tobacco: Never  Vaping Use   Vaping status: Never Used  Substance Use Topics  Alcohol use: No   Drug use: No    Current Outpatient Medications  Medication Sig Dispense Refill   acetaminophen  (TYLENOL ) 325 MG tablet Take 650 mg by mouth every 6 (six) hours as needed.     albuterol  (VENTOLIN  HFA) 108 (90 Base) MCG/ACT inhaler Inhale 2 puffs into the lungs every 4 (four) hours as needed for wheezing or shortness of breath. 18 g 1   atenolol  (TENORMIN ) 25 MG tablet Take 0.5 tablets (12.5 mg total) by mouth daily. 30 tablet 3   Boswellia-Glucosamine-Vit D (OSTEO BI-FLEX ONE PER DAY PO) Take 1 tablet by mouth.     Cholecalciferol 50 MCG (2000 UT) TABS Take 2,000 Units by mouth daily.     clobetasol  ointment (TEMOVATE ) 0.05 % Apply 1 Application topically 3 (three) times a week. 60 g 3   clonazePAM (KLONOPIN) 2 MG tablet Take 2 mg by mouth daily as needed.     dicyclomine  (BENTYL ) 20 MG tablet Take 20 mg by mouth daily at 6 (six) AM.     ezetimibe -simvastatin  (VYTORIN ) 10-40 MG per tablet Take 1 tablet by mouth every evening.      fluticasone  (FLONASE ) 50 MCG/ACT nasal spray Place 1 spray into both nostrils daily. 48 g 1   Fluticasone  Furoate (ARNUITY ELLIPTA ) 100 MCG/ACT AEPB Inhale 1 puff into the lungs every morning. 90 each 1   levothyroxine  (SYNTHROID ) 50 MCG tablet Take 50 mcg by mouth daily.      Multiple Vitamin (MULTIVITAMIN) tablet Take 1 tablet  by mouth daily.     pantoprazole  (PROTONIX ) 40 MG tablet Take 1 tablet (40 mg total) by mouth in the morning. 90 tablet 1   pantoprazole  (PROTONIX ) 40 MG tablet Take 1 tablet (40 mg total) by mouth 2 (two) times daily before a meal. 60 tablet 0   Polyethyl Glycol-Propyl Glycol 0.4-0.3 % SOLN Place one drop in both eyes daily as needed 30 mL 2   No current facility-administered medications for this visit.    Allergies  Allergen Reactions   Amoxicillin-Pot Clavulanate Other (See Comments)    Severe pains and GI symptoms Did it involve swelling of the face/tongue/throat, SOB, or low BP? No Did it involve sudden or severe rash/hives, skin peeling, or any reaction on the inside of your mouth or nose? No Did you need to seek medical attention at a hospital or doctor's office? No When did it last happen?      within the past 10 years If all above answers are "NO", may proceed with cephalosporin use.    Temazepam Other (See Comments)    Dizziness and doesn't work for patient   Lipitor [Atorvastatin Calcium]     Muscle pain   Amoxicillin Nausea And Vomiting   Lansoprazole  Palpitations    "Body felt sick"    Penicillin G Other (See Comments)    Unknown reaction     Review of Systems:  Psychiatric/Behavioral: Has anxiety or depression     Physical Exam:    BP 122/62   Pulse 87   Ht 5\' 1"  (1.549 m)   Wt 125 lb (56.7 kg)   BMI 23.62 kg/m  Wt Readings from Last 3 Encounters:  05/01/24 125 lb (56.7 kg)  03/11/24 124 lb (56.2 kg)  02/19/24 125 lb 9.6 oz (57 kg)   Constitutional:  Well-developed, in no acute distress. Psychiatric: Normal mood and affect. Behavior is normal. HEENT: Pupils normal.  Conjunctivae are normal. No scleral icterus. Cardiovascular: Normal rate, regular rhythm. No edema Pulmonary/chest: Effort  normal and breath sounds normal. No wheezing, rales or rhonchi. Abdominal: Soft, nondistended. Nontender. Bowel sounds active throughout. There are no masses palpable.  No hepatomegaly. Rectal: Deferred Neurological: Alert and oriented to person place and time. Skin: Skin is warm and dry. No rashes noted.  Data Reviewed: I have personally reviewed following labs and imaging studies  CBC:    Latest Ref Rng & Units 12/19/2023    2:18 PM 08/31/2023   10:14 AM 07/29/2022    2:47 PM  CBC  WBC 4.0 - 10.5 K/uL 7.0  5.2  7.4   Hemoglobin 12.0 - 15.0 g/dL 16.1  09.6  04.5   Hematocrit 36.0 - 46.0 % 42.4  42.3  42.6   Platelets 150.0 - 400.0 K/uL 260.0  256  276     CMP:    Latest Ref Rng & Units 12/19/2023    2:18 PM 08/31/2023   10:14 AM 10/27/2022    1:55 PM  CMP  Glucose 70 - 99 mg/dL 75  95  81   BUN 6 - 23 mg/dL 8  10  12    Creatinine 0.40 - 1.20 mg/dL 4.09  8.11  9.14   Sodium 135 - 145 mEq/L 140  141  140   Potassium 3.5 - 5.1 mEq/L 3.7  3.7  3.8   Chloride 96 - 112 mEq/L 102  104  104   CO2 19 - 32 mEq/L 29  27  30    Calcium 8.4 - 10.5 mg/dL 9.1  9.2  9.0   Total Protein 6.0 - 8.3 g/dL 7.5  7.2    Total Bilirubin 0.2 - 1.2 mg/dL 1.1  1.5    Alkaline Phos 39 - 117 U/L 95  85    AST 0 - 37 U/L 27  24    ALT 0 - 35 U/L 17  21        Radiology Studies: No results found.    Magnus Schuller, MD 05/01/2024, 10:40 AM  Cc: Charle Congo, MD

## 2024-05-01 NOTE — Patient Instructions (Addendum)
 _______________________________________________________  If your blood pressure at your visit was 140/90 or greater, please contact your primary care physician to follow up on this.  _______________________________________________________  If you are age 79 or older, your body mass index should be between 23-30. Your Body mass index is 23.62 kg/m. If this is out of the aforementioned range listed, please consider follow up with your Primary Care Provider.  If you are age 45 or younger, your body mass index should be between 19-25. Your Body mass index is 23.62 kg/m. If this is out of the aformentioned range listed, please consider follow up with your Primary Care Provider.   ________________________________________________________  The [facility] providers would like to encourage you to use MYCHART to communicate with providers for non-urgent requests or questions.  Due to long hold times on the telephone, sending your provider a message by [facility] may be a faster and more efficient way to get a response.  Please allow 48 business hours for a response.  Please remember that this is for non-urgent requests.  _______________________________________________________  We have sent the following medications to your pharmacy for you to pick up at your convenience: Protonix   Low FODMAP Diet: (Fermentable Oligosaccharides, Disaccharides, Monosaccharides, and Polyols) These are short chain carbohydrates and sugar alcohols that are poorly absorbed by the body, resulting in multiple abdominal symptoms, including changes in bowel habits, abdominal pain/discomfort, bloating, abdominal distension, gas, etc.     Thank you,  Dr. Lajuan Pila

## 2024-06-01 ENCOUNTER — Other Ambulatory Visit: Payer: Self-pay | Admitting: Allergy and Immunology

## 2024-06-20 ENCOUNTER — Other Ambulatory Visit: Payer: Self-pay | Admitting: *Deleted

## 2024-06-20 MED ORDER — ALBUTEROL SULFATE HFA 108 (90 BASE) MCG/ACT IN AERS
2.0000 | INHALATION_SPRAY | RESPIRATORY_TRACT | 1 refills | Status: DC | PRN
Start: 2024-06-20 — End: 2024-10-06

## 2024-06-23 ENCOUNTER — Ambulatory Visit: Admitting: Obstetrics and Gynecology

## 2024-06-23 ENCOUNTER — Encounter: Payer: Self-pay | Admitting: Obstetrics and Gynecology

## 2024-06-23 VITALS — BP 129/78 | HR 80 | Wt 123.5 lb

## 2024-06-23 DIAGNOSIS — D229 Melanocytic nevi, unspecified: Secondary | ICD-10-CM

## 2024-06-23 DIAGNOSIS — R233 Spontaneous ecchymoses: Secondary | ICD-10-CM

## 2024-06-23 MED ORDER — CLOBETASOL PROPIONATE 0.05 % EX OINT: 1.0000 | TOPICAL_OINTMENT | CUTANEOUS | 3 refills | Status: AC

## 2024-06-23 NOTE — Progress Notes (Signed)
 Pt has some areas of concern on her face, also has pain in buttocks.

## 2024-06-23 NOTE — Progress Notes (Signed)
 78 yo P4 here for follow up on lichen sclerosis. Patient is doing well and is without any new GYN complaints. She reports new onset of rectal pain due to hemorrhoids. She has been applying clobetasol  cream as prescribed. Patient with recent diagnosis of IBS currently followed by GI. Patient is without any other complaints. 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)    Hypothyroidism    followed by pcp   LBBB (left bundle branch block)    Mild persistent allergic asthma    no longer   Palpitations    Panic attacks    Pneumonia    Pre-diabetes    Tricuspid regurgitation 07/06/2022   severe   Wears glasses    Past Surgical History:  Procedure Laterality Date   CARDIAC SURGERY  02/05/2007   in [place]    Septal Myectomy  for HOCM   CATARACT EXTRACTION W/ INTRAOCULAR LENS  IMPLANT, BILATERAL  right 07-23-2017;  left 10-15-2017   COLONOSCOPY N/A 11/24/2013   Procedure: COLONOSCOPY;  Surgeon: Renaye Sous, MD;  Location: [facility];  Service: Endoscopy;  Laterality: N/A;   DOPPLER ECHOCARDIOGRAPHY  03/14/2013   EF 65-70%,trivial AI,mild-mod. MR,Mod. TR,mod. concentric hypertrophy   EP IMPLANTABLE DEVICE N/A 08/22/2016   Procedure: Loop Recorder Removal;  Surgeon: Jerel Balding, MD;  [facility]: [facility];  Service: Cardiovascular;  Laterality: N/A;   ESOPHAGOGASTRODUODENOSCOPY N/A 11/24/2013   Procedure: ESOPHAGOGASTRODUODENOSCOPY (EGD);   Surgeon: Renaye Sous, MD;  [facility]: [facility];  Service: Endoscopy;  Laterality: N/A;   ESOPHAGOGASTRODUODENOSCOPY N/A 03/11/2024   Procedure: EGD (ESOPHAGOGASTRODUODENOSCOPY);  Surgeon: Wilhelmenia Aloha Raddle., MD;  Location: [facility];  Service: Gastroenterology;  Laterality: N/A;   EUS N/A 03/11/2024   Procedure: UPPER ENDOSCOPIC ULTRASOUND (EUS) RADIAL;  Surgeon: Wilhelmenia Aloha Raddle., MD;  Location: [facility];  Service: Gastroenterology;  Laterality: N/A;   GLAUCOMA SURGERY Bilateral 2019   release pressure   KNEE ARTHROSCOPY Right 10/17/2013   Procedure: ARTHROSCOPY RIGHT KNEE;  Surgeon: Elspeth JONELLE Her, MD;  [facility];  Service: Orthopedics;  Laterality: Right;   KNEE ARTHROSCOPY Left 11/08/2022   Procedure: ARTHROSCOPY KNEE; MEDIAL AND LATERAL MENISECTOMIES, CHONDROPLASTY;  Surgeon: Her Kemps, MD;  Location: [facility];  Service: Orthopedics;  Laterality: Left;  60 min   LAPAROSCOPIC CHOLECYSTECTOMY  02-03-2010   dr d blackman  @[facility]    LOOP RECORDER IMPLANT  03/18/2013   LOOP RECORDER IMPLANT N/A 03/18/2013   Procedure: LOOP RECORDER IMPLANT;  Surgeon: Jerel Balding, MD;  Location: [facility];  Service: Cardiovascular;  Laterality: N/A;   POLYPECTOMY  03/11/2024   Procedure: POLYPECTOMY;  Surgeon: Wilhelmenia Aloha Raddle., MD;  Location: [facility];  Service: Gastroenterology;;   SHOULDER ARTHROSCOPY WITH ROTATOR CUFF REPAIR Right 01/16/2019   Procedure: Right shoulder arthroscopy with subacromial decompression, distal clavicle resection, rotator cuff repair,  bicep tenotomy;  Surgeon: Melita Drivers, MD;  Location: [facility];  Service: Orthopedics;  Laterality: Right;   TUBAL LIGATION  yrs ago   Family History  Problem Relation Age of Onset   Cancer Mother        breast   Hypertension Mother    Breast cancer Mother    Cancer Father        prostate   Diabetes Sister    Heart attack Sister    Cancer Brother    Cancer Son    Liver disease Neg Hx    Colon cancer Neg Hx     Esophageal cancer Neg Hx    Stomach cancer Neg Hx    Rectal cancer Neg Hx    Social History   Tobacco Use   Smoking status: Never    Passive exposure: Past   Smokeless tobacco: Never  Vaping Use   Vaping status: Never Used  Substance Use Topics   Alcohol use: No   Drug use: No   ROS See pertinent in HPI. All other systems reviewed and non contributory Blood pressure 129/78, pulse 80, weight 123 lb 8 oz (56 kg).  GENERAL: Well-developed, well-nourished female in no acute distress.  LUNGS: Clear to auscultation bilaterally.  HEART: Regular rate and rhythm. ABDOMEN: Soft, nontender, nondistended. No organomegaly. PELVIC: Normal external female genitalia. Vagina is pale and atrophic with two 1 cm areas at 12 and 6 o'clock at the introitus consistent with lichen sclerosis.  Normal discharge. Uterus is normal in size. No adnexal mass or tenderness. 2 large hemorrhoid, slightly tender to touch. Chaperone present during the pelvic exam EXTREMITIES: No cyanosis, clubbing, or edema, 2+ distal pulses.  [name] 78 yo here for lichen sclerosis follow up - Continue current treatment - Refill on clobetasol  cream provided - Patient to follow up wit PCP as scheduled - Last colonoscopy 2024. Advised to use over the counter measures, applying cold compresses to the area - Patient due for mammogram in October with [facility] - Referral for dermatology placed

## 2024-08-17 NOTE — Patient Instructions (Incomplete)
  1.  Continue Arnuity 100 - 1 inhalation 1-7 times per week  2.  Continue Flonase  - 1 spray each nostril 1-7 times per week  3.  Continue pantoprazole  40 mg in a.m.    4.  Can use nasal saline and systane/ refresh eye drops if needed.  5.  Can use albuterol  HFA 2 puffs every 4-6 hours if needed.  6. Can increase Arnuity and Flonase  to 2 times per day during Flare up  7. Return to clinic in  months or earlier if problem  8. Influenza = Tamiflu. Covid = Paxlovid

## 2024-08-18 ENCOUNTER — Other Ambulatory Visit: Payer: Self-pay

## 2024-08-18 ENCOUNTER — Encounter: Payer: Self-pay | Admitting: Family

## 2024-08-18 ENCOUNTER — Ambulatory Visit (INDEPENDENT_AMBULATORY_CARE_PROVIDER_SITE_OTHER): Admitting: Family

## 2024-08-18 VITALS — BP 128/84 | HR 81 | Temp 98.0°F

## 2024-08-18 DIAGNOSIS — J3089 Other allergic rhinitis: Secondary | ICD-10-CM

## 2024-08-18 DIAGNOSIS — J4540 Moderate persistent asthma, uncomplicated: Secondary | ICD-10-CM

## 2024-08-18 DIAGNOSIS — K219 Gastro-esophageal reflux disease without esophagitis: Secondary | ICD-10-CM

## 2024-08-18 MED ORDER — FLUTICASONE PROPIONATE 50 MCG/ACT NA SUSP: NASAL | 1 refills | Status: AC

## 2024-08-18 NOTE — Progress Notes (Signed)
 [address] [place] [address] Dept: [phone]  FOLLOW UP NOTE  Patient ID: Autumn Fitzgerald, female    DOB: [birth year]  Age: 78 y.o. MRN: [id number] Date of Office Visit: 08/18/2024  Assessment  Chief Complaint: Follow-up (No concerns doing good)  HPI Autumn Fitzgerald is a 78 year old female who presents today for follow-up of well-controlled moderate persistent asthma, allergic rhinitis, laryngopharyngeal reflux disease, and generalized pruritus.  She was last seen on February 19, 2024 by Dr. Maurilio.  She denies any new diagnosis or surgery since her last office visit.  Moderate persistent asthma: She continues to take Arnuity 100 mcg 2 times a week.  She reports that this medication causes her heart to go too fast if she uses too often.  She reports that she will cough sometimes due to allergies and will sometimes have shortness of breath, but says this is due to her congestive heart failure.  When she cleans she has to stop and sit down.  She denies any leg swelling.  She does have chest pain sometimes.  She is not having any chest pain today.  She denies wheezing, tightness in her chest, and nocturnal awakenings due to breathing problems.  Since her last office visit she has not required any systemic steroids or made any trips to the emergency room or urgent care due to breathing problems.  She maybe uses her albuterol  once a month.  Allergic rhinitis: She denies rhinorrhea, nasal congestion, postnasal drip.  She has not been treated for any sinus infections since we last saw her.  She uses Flonase  nasal spray as needed and does not use saline nasal spray.  Laryngopharyngeal reflux disease: She continues to take pantoprazole  20 mg once a day as needed  Generalized pruritus: She reports that this is gone now.  .   Drug Allergies:  Allergies  Allergen Reactions   Amoxicillin-Pot Clavulanate Other (See Comments)    Severe pains and GI symptoms Did it involve swelling of the  face/tongue/throat, SOB, or low BP? No Did it involve sudden or severe rash/hives, skin peeling, or any reaction on the inside of your mouth or nose? No Did you need to seek medical attention at a hospital or doctor's office? No When did it last happen?      within the past 10 years If all above answers are "NO", may proceed with cephalosporin use.    Temazepam Other (See Comments)    Dizziness and doesn't work for patient   Lipitor [Atorvastatin Calcium]     Muscle pain   Amoxicillin Nausea And Vomiting   Lansoprazole  Palpitations    Body felt sick    Penicillin G Other (See Comments)    Unknown reaction     Review of Systems: Negative except as per HPI   Physical Exam: BP 128/84   Pulse 81   Temp 98 F (36.7 C)   SpO2 98%    Physical Exam Constitutional:      Appearance: Normal appearance.  HENT:     Head: Normocephalic and atraumatic.     Comments: Pharynx normal, eyes normal, ears normal, nose normal    Right Ear: Tympanic membrane, ear canal and external ear normal.     Left Ear: Tympanic membrane, ear canal and external ear normal.     Nose: Nose normal.     Mouth/Throat:     Mouth: Mucous membranes are moist.     Pharynx: Oropharynx is clear.  Eyes:     Conjunctiva/sclera:  Conjunctivae normal.  Cardiovascular:     Rate and Rhythm: Regular rhythm.     Heart sounds: Normal heart sounds.  Pulmonary:     Effort: Pulmonary effort is normal.     Breath sounds: Normal breath sounds.     Comments: Lungs clear to auscultation Musculoskeletal:     Cervical back: Neck supple.  Skin:    General: Skin is warm.  Neurological:     Mental Status: She is alert and oriented to person, place, and time.  Psychiatric:        Mood and Affect: Mood normal.        Behavior: Behavior normal.        Thought Content: Thought content normal.        Judgment: Judgment normal.     Diagnostics: FVC 1.57 L (77%), FEV1 1.33 L (84%), FEV1/FVC 0.85.  Spirometry indicates normal  spirometry.  Assessment and Plan: 1. Other allergic rhinitis   2. Asthma, moderate persistent, well-controlled   3. LPRD (laryngopharyngeal reflux disease)     Meds ordered this encounter  Medications   fluticasone  (FLONASE ) 50 MCG/ACT nasal spray    Sig: Place 1 spray in each nostril once a day as needed for stuffy nose    Dispense:  48 g    Refill:  1    Patient Instructions    1.  Continue Arnuity 100 - 1 inhalation 1-7 times per week  2.  Continue Flonase  - 1 spray each nostril 1-7 times per week  3.  Continue pantoprazole  20 mg in a.m.    4.  Can use nasal saline and systane/ refresh eye drops if needed.  5.  Can use albuterol  HFA 2 puffs every 4-6 hours if needed.  6. Can increase Arnuity and Flonase  to 2 times per day during Flare up  Recommend contacting your cardiologist about your chest pain that you get at times.  She is currently not having any chest pain today.  Discussed if she is having symptoms of chest pain to go to the emergency room.  7. Return to clinic in 6 months or earlier if problem  8. Influenza = Tamiflu. Covid = Paxlovid              Return in about 6 months (around 02/15/2025), or if symptoms worsen or fail to improve.    Thank you for the opportunity to care for this patient.  Please do not hesitate to contact me with questions.  Wanda Craze, FNP [facility] 

## 2024-10-06 ENCOUNTER — Other Ambulatory Visit: Payer: Self-pay | Admitting: Allergy and Immunology

## 2024-10-09 ENCOUNTER — Ambulatory Visit: Admitting: Dermatology

## 2024-10-16 ENCOUNTER — Emergency Department (HOSPITAL_COMMUNITY)

## 2024-10-16 ENCOUNTER — Encounter (HOSPITAL_COMMUNITY): Payer: Self-pay

## 2024-10-16 ENCOUNTER — Emergency Department (HOSPITAL_COMMUNITY)
Admission: EM | Admit: 2024-10-16 | Discharge: 2024-10-16 | Disposition: A | Discharge status: Home / Self Care | Attending: Emergency Medicine | Admitting: Emergency Medicine

## 2024-10-16 ENCOUNTER — Other Ambulatory Visit: Payer: Self-pay

## 2024-10-16 DIAGNOSIS — E039 Hypothyroidism, unspecified: Secondary | ICD-10-CM | POA: Insufficient documentation

## 2024-10-16 DIAGNOSIS — R002 Palpitations: Secondary | ICD-10-CM | POA: Insufficient documentation

## 2024-10-16 DIAGNOSIS — I10 Essential (primary) hypertension: Secondary | ICD-10-CM | POA: Insufficient documentation

## 2024-10-16 DIAGNOSIS — R0602 Shortness of breath: Secondary | ICD-10-CM | POA: Diagnosis not present

## 2024-10-16 LAB — TROPONIN I (HIGH SENSITIVITY)
Troponin I (High Sensitivity): 31 ng/L — ABNORMAL HIGH (ref <18)
Troponin I (High Sensitivity): 31 ng/L — ABNORMAL HIGH (ref <18)

## 2024-10-16 LAB — CBC
HCT: 42.2 % (ref 36.0–46.0)
Hemoglobin: 13.7 g/dL (ref 12.0–15.0)
MCH: 28.6 pg (ref 26.0–34.0)
MCHC: 32.5 g/dL (ref 30.0–36.0)
MCV: 88.1 fL (ref 80.0–100.0)
Platelets: 240 K/uL (ref 150–400)
RBC: 4.79 MIL/uL (ref 3.87–5.11)
RDW: 13.1 % (ref 11.5–15.5)
WBC: 6.9 K/uL (ref 4.0–10.5)
nRBC: 0 % (ref 0.0–0.2)

## 2024-10-16 LAB — BASIC METABOLIC PANEL WITH GFR
Anion gap: 9 (ref 5–15)
BUN: 11 mg/dL (ref 8–23)
CO2: 28 mmol/L (ref 22–32)
Calcium: 8.8 mg/dL — ABNORMAL LOW (ref 8.9–10.3)
Chloride: 103 mmol/L (ref 98–111)
Creatinine, Ser: 0.74 mg/dL (ref 0.44–1.00)
GFR, Estimated: >60 mL/min (ref >60)
Glucose, Bld: 88 mg/dL (ref 70–99)
Potassium: 3.7 mmol/L (ref 3.5–5.1)
Sodium: 140 mmol/L (ref 135–145)

## 2024-10-16 LAB — BRAIN NATRIURETIC PEPTIDE: B Natriuretic Peptide: 76 pg/mL (ref 0.0–100.0)

## 2024-10-16 NOTE — ED Notes (Signed)
 Patient dc by RN. Patient verbalizes understanding of instructions. Discharged home with family.

## 2024-10-16 NOTE — ED Triage Notes (Signed)
 She reports that she had a major open heart surgery in [place]  in 2008.

## 2024-10-16 NOTE — ED Provider Notes (Signed)
 [place] EMERGENCY DEPARTMENT AT [facility] Provider Note   CSN: [id number] Arrival date & time: 10/16/24  1010     Patient presents with: Chest Pain, Shortness of Breath, and Weakness   Autumn Fitzgerald is a 78 y.o. female.   Patient is a 78 year old female with past medical history of HOCM status post myomectomy, hypertension, hypothyroidism presenting to the emergency department with palpitations and shortness of breath.  Patient reports for the last few days she has felt like her heart is beating harder than normal.  She states that her blood pressure has been running higher than usual in the 140s to 160s.  She states that she does not always take her blood pressure medication daily but takes it when it has been running high and has taken it in the last few days without any improvement.  She states she has associated shortness of breath.  She states that she feels lightheaded on doing activities.  She denies any lower extremity swelling.  She denies any history of VTE, or any recent hospitalization or surgery of any cancer history or hormone use.  The history is provided by the patient and a relative.  Chest Pain Associated symptoms: shortness of breath and weakness   Shortness of Breath Associated symptoms: chest pain   Weakness Associated symptoms: chest pain and shortness of breath        Prior to Admission medications   Medication Sig Start Date End Date Taking? Authorizing Provider  acetaminophen  (TYLENOL ) 325 MG tablet Take 650 mg by mouth every 6 (six) hours as needed.    [provider]  albuterol  (VENTOLIN  HFA) 108 (90 Base) MCG/ACT inhaler INHALE 2 PUFFS INTO THE LUNGS EVERY 4 HOURS AS NEEDED FOR WHEEZING OR SHORTNESS OF BREATH 10/06/24   Cheryl Reusing, FNP  atenolol  (TENORMIN ) 25 MG tablet Take 0.5 tablets (12.5 mg total) by mouth daily. 12/03/19   Croitoru, Mihai, MD  Boswellia-Glucosamine-Vit D (OSTEO BI-FLEX ONE PER DAY PO) Take 1 tablet by  mouth.    [provider]  Cholecalciferol 50 MCG (2000 UT) TABS Take 2,000 Units by mouth daily.    [provider]  clobetasol  ointment (TEMOVATE ) 0.05 % Apply 1 Application topically 3 (three) times a week. 06/23/24   Constant, Peggy, MD  clonazePAM (KLONOPIN) 2 MG tablet Take 2 mg by mouth daily as needed. 11/15/22   [provider]  dicyclomine  (BENTYL ) 20 MG tablet Take 20 mg by mouth daily at 6 (six) AM. 11/02/23   [provider]  ezetimibe -simvastatin  (VYTORIN ) 10-40 MG per tablet Take 1 tablet by mouth every evening.     [provider]  fluticasone  (FLONASE ) 50 MCG/ACT nasal spray Place 1 spray in each nostril once a day as needed for stuffy nose 08/18/24   Cheryl Reusing, FNP  Fluticasone  Furoate (ARNUITY ELLIPTA ) 100 MCG/ACT AEPB Inhale 1 puff into the lungs every morning. 02/19/24   Kozlow, Camellia PARAS, MD  levothyroxine  (SYNTHROID ) 50 MCG tablet Take 50 mcg by mouth daily.  06/15/14   [provider]  Multiple Vitamin (MULTIVITAMIN) tablet Take 1 tablet by mouth daily.    [provider]  pantoprazole  (PROTONIX ) 20 MG tablet Take 1 tablet (20 mg total) by mouth daily. 05/01/24   Charlanne Groom, MD  pantoprazole  (PROTONIX ) 40 MG tablet Take 1 tablet (40 mg total) by mouth in the morning. 02/19/24   Kozlow, Camellia PARAS, MD  pantoprazole  (PROTONIX ) 40 MG tablet Take 1 tablet (40 mg total) by mouth  2 (two) times daily before a meal. 03/11/24 03/11/25  Mansouraty, Aloha Raddle., MD  Polyethyl Glycol-Propyl Glycol 0.4-0.3 % SOLN Place one drop in both eyes daily as needed 01/04/21   Kozlow, Camellia PARAS, MD    Allergies: Amoxicillin-pot clavulanate, Temazepam, Lipitor [atorvastatin calcium], Amoxicillin, Lansoprazole , and Penicillin g    Review of Systems  Respiratory:  Positive for shortness of breath.   Cardiovascular:  Positive for chest pain.  Neurological:  Positive for weakness.    Updated Vital Signs BP (!) 151/90 (BP Location: Right Arm)    Pulse 72   Temp 97.7 F (36.5 C)   Resp 16   Ht 5' 1 (1.549 m)   Wt 55.3 kg   SpO2 100%   BMI 23.05 kg/m   Physical Exam Vitals and nursing note reviewed.  Constitutional:      General: She is not in acute distress.    Appearance: She is well-developed.  HENT:     Head: Normocephalic.  Eyes:     Extraocular Movements: Extraocular movements intact.  Cardiovascular:     Rate and Rhythm: Normal rate and regular rhythm.     Heart sounds: Normal heart sounds.  Pulmonary:     Effort: Pulmonary effort is normal.     Breath sounds: Normal breath sounds.  Abdominal:     Palpations: Abdomen is soft.     Tenderness: There is no abdominal tenderness.  Musculoskeletal:        General: Normal range of motion.     Cervical back: Normal range of motion and neck supple.     Right lower leg: No edema.     Left lower leg: No edema.  Skin:    General: Skin is warm and dry.  Neurological:     General: No focal deficit present.     Mental Status: She is alert and oriented to person, place, and time.  Psychiatric:        Mood and Affect: Mood normal.        Behavior: Behavior normal.     (all labs ordered are listed, but only abnormal results are displayed) Labs Reviewed  BASIC METABOLIC PANEL WITH GFR - Abnormal; Notable for the following components:      Result Value   Calcium 8.8 (*)    All other components within normal limits  TROPONIN I (HIGH SENSITIVITY) - Abnormal; Notable for the following components:   Troponin I (High Sensitivity) 31 (*)    All other components within normal limits  TROPONIN I (HIGH SENSITIVITY) - Abnormal; Notable for the following components:   Troponin I (High Sensitivity) 31 (*)    All other components within normal limits  CBC  BRAIN NATRIURETIC PEPTIDE    EKG: EKG Interpretation Date/Time:  Thursday October 16 2024 11:48:41 EST Ventricular Rate:  72 PR Interval:  136 QRS Duration:  116 QT Interval:  396 QTC Calculation: 433 R  Axis:   104  Text Interpretation: Normal sinus rhythm Rightward axis Anterior infarct , age undetermined ST & T wave abnormality, consider inferolateral ischemia Abnormal ECG  inferior t-wave inversions compared to prior EKG, prior lateral t-wave inversions now upright Confirmed by Ellouise Fine ([id number]) on 10/16/2024 1:44:51 PM  Radiology: [facility] Chest 2 View Result Date: 10/16/2024 EXAM: 2 VIEW(S) XRAY OF THE CHEST 10/16/2024 11:58:00 AM COMPARISON: None available. CLINICAL HISTORY: chest pain, shob FINDINGS: LUNGS AND PLEURA: No focal pulmonary opacity. No pulmonary edema. No pleural effusion. No pneumothorax. HEART AND MEDIASTINUM: No acute abnormality of the  cardiac and mediastinal silhouettes. Median sternotomy wires noted. BONES AND SOFT TISSUES: Thoracic degenerative changes. Right upper quadrant cholecystectomy clips. IMPRESSION: 1. No acute cardiopulmonary process identified. Electronically signed by: Waddell Calk MD 10/16/2024 01:07 PM EST RP Workstation: [id number]     Procedures   Medications Ordered in the ED - No data to display  Clinical Course as of 10/16/24 1530  Thu Oct 16, 2024  1505 Repeat troponin flat, no active chest pain making ACS unlikely.  [VK]    Clinical Course User Index [VK] Kingsley, Sherod Cisse K, DO                                 Medical Decision Making This patient presents to the ED with chief complaint(s) of palpitations, shortness of breath with pertinent past medical history of HOCM status post myomectomy, hypertension, hypothyroidism which further complicates the presenting complaint. The complaint involves an extensive differential diagnosis and also carries with it a high risk of complications and morbidity.    The differential diagnosis includes ACS, arrhythmia, anemia, pneumonia, pneumothorax, pulmonary edema, pleural effusion, low risk by Wells criteria making PE less likely, electrolyte abnormality, anxiety  Additional history  obtained: Additional history obtained from family Records reviewed outpatient cardiology records  ED Course and Reassessment: On patient's arrival she is hemodynamically stable in no acute distress.  EKG showed normal sinus rhythm with nonspecific T wave changes.  She had labs and chest x-ray initiated in triage.  Initial troponin was mildly elevated and will need repeat troponin.  She declines any chest pain at this time.  Chest x-ray showed no acute disease.  She will be closely reassessed.  Independent labs interpretation:  The following labs were independently interpreted: mildly elevated trop -> flat on repeat, otherwise no significant abnormality  Independent visualization of imaging: - I independently visualized the following imaging with scope of interpretation limited to determining acute life threatening conditions related to emergency care: CXR, which revealed no acute disease  Consultation: - Consulted or discussed management/test interpretation w/ external professional: N/A  Consideration for admission or further workup: Patient has no emergent conditions requiring admission or further work-up at this time and is stable for discharge home with primary care and cardiology follow-up  Social Determinants of health: N/A    Amount and/or Complexity of Data Reviewed Labs: ordered. Radiology: ordered.       Final diagnoses:  Palpitations    ED Discharge Orders          Ordered    Ambulatory referral to Cardiology       Comments: If you have not heard from the Cardiology office within the next 72 hours please call [phone].   10/16/24 1528               Ellouise Richerd POUR, DO 10/16/24 1530

## 2024-10-16 NOTE — ED Triage Notes (Signed)
 Patient reports chest pains for the past 4 days. She reports hot flashes before the chest pain and then she gets palpitations and a fast heart rate. She reports her symptoms start in the afternoon. She reports that she feels very weak and shob, decreased appetite.  PMH: CHF

## 2024-10-16 NOTE — Discharge Instructions (Signed)
 You were seen in the emergency department for your heart palpitations and feeling unwell.  Your heart enzyme was elevated but was unchanged on repeat and had no signs of heart attack or abnormal heart rhythm on your EKG.  It is unclear what is causing your symptoms at this time but you should follow-up with your cardiologist as an outpatient for further workup and evaluation.  We have placed a new referral but you can also call their office to see if you can expedite your follow-up appointment.  Make sure that you are taking your blood pressure medication as prescribed unless further instructed by your cardiologist.  You should return to the emergency department if you are having significantly worsening chest pain, severe shortness of breath, you pass out or if you have any other new or concerning symptoms.

## 2024-10-19 ENCOUNTER — Other Ambulatory Visit: Payer: Self-pay | Admitting: Allergy and Immunology

## 2024-10-22 ENCOUNTER — Ambulatory Visit

## 2024-10-22 ENCOUNTER — Ambulatory Visit: Attending: Cardiovascular Disease | Admitting: Cardiovascular Disease

## 2024-10-22 ENCOUNTER — Encounter: Payer: Self-pay | Admitting: Cardiovascular Disease

## 2024-10-22 VITALS — BP 130/68 | HR 82 | Ht 61.0 in | Wt 127.0 lb

## 2024-10-22 DIAGNOSIS — I447 Left bundle-branch block, unspecified: Secondary | ICD-10-CM

## 2024-10-22 DIAGNOSIS — R002 Palpitations: Secondary | ICD-10-CM

## 2024-10-22 DIAGNOSIS — E781 Pure hyperglyceridemia: Secondary | ICD-10-CM

## 2024-10-22 DIAGNOSIS — E039 Hypothyroidism, unspecified: Secondary | ICD-10-CM

## 2024-10-22 DIAGNOSIS — I422 Other hypertrophic cardiomyopathy: Secondary | ICD-10-CM | POA: Diagnosis not present

## 2024-10-22 DIAGNOSIS — I5032 Chronic diastolic (congestive) heart failure: Secondary | ICD-10-CM

## 2024-10-22 MED ORDER — ATENOLOL 25 MG PO TABS: 12.5000 mg | ORAL_TABLET | Freq: Two times a day (BID) | ORAL | 3 refills | Status: AC

## 2024-10-22 NOTE — Progress Notes (Unsigned)
 Enrolled for [employer] to mail a ZIO XT long term holter monitor to the patients address on file.

## 2024-10-22 NOTE — Patient Instructions (Signed)
 Medication Instructions:   TAKE atenolol  12.5mg  twice daily  *If you need a refill on your cardiac medications before your next appointment, please call your pharmacy*   Testing/Procedures: Your physician has requested that you have an echocardiogram. Echocardiography is a painless test that uses sound waves to create images of your heart. It provides your doctor with information about the size and shape of your heart and how well your heart's chambers and valves are working. This procedure takes approximately one hour. There are no restrictions for this procedure. Please do NOT wear cologne, perfume, aftershave, or lotions (deodorant is allowed). Please arrive 15 minutes prior to your appointment time.  Please note: We ask at that you not bring children with you during ultrasound (echo/ vascular) testing. Due to room size and safety concerns, children are not allowed in the ultrasound rooms during exams. Our front office staff cannot provide observation of children in our lobby area while testing is being conducted. An adult accompanying a patient to their appointment will only be allowed in the ultrasound room at the discretion of the ultrasound technician under special circumstances. We apologize for any inconvenience.  ZIO XT- Long Term Monitor Instructions  Your physician has requested you wear a ZIO patch monitor for 14 days.  This is a single patch monitor. [employer] supplies one patch monitor per enrollment. Additional stickers are not available. Please do not apply patch if you will be having a Nuclear Stress Test,  Echocardiogram, Cardiac CT, MRI, or Chest Xray during the period you would be wearing the  monitor. The patch cannot be worn during these tests. You cannot remove and re-apply the  ZIO XT patch monitor.  Your ZIO patch monitor will be mailed 3 day USPS to your address on file. It may take 3-5 days  to receive your monitor after you have been enrolled.  Once you have  received your monitor, please review the enclosed instructions. Your monitor  has already been registered assigning a specific monitor serial # to you.  Billing and Patient Assistance Program Information  We have supplied [employer] with any of your insurance information on file for billing purposes. [employer] offers a sliding scale Patient Assistance Program for patients that do not have  insurance, or whose insurance does not completely cover the cost of the ZIO monitor.  You must apply for the Patient Assistance Program to qualify for this discounted rate.  To apply, please call [employer] at [phone], select option 4, select option 2, ask to apply for  Patient Assistance Program. Meredeth will ask your household income, and how many people  are in your household. They will quote your out-of-pocket cost based on that information.  [employer] will also be able to set up a 89-month, interest-free payment plan if needed.  Applying the monitor   Shave hair from upper left chest.  Hold abrader disc by orange tab. Rub abrader in 40 strokes over the upper left chest as  indicated in your monitor instructions.  Clean area with 4 enclosed alcohol pads. Let dry.  Apply patch as indicated in monitor instructions. Patch will be placed under collarbone on left  side of chest with arrow pointing upward.  Rub patch adhesive wings for 2 minutes. Remove white label marked 1. Remove the white  label marked 2. Rub patch adhesive wings for 2 additional minutes.  While looking in a mirror, press and release button in center of patch. A small green light will  flash 3-4 times. This will be  your only indicator that the monitor has been turned on.  Do not shower for the first 24 hours. You may shower after the first 24 hours.  Press the button if you feel a symptom. You will hear a small click. Record Date, Time and  Symptom in the Patient Logbook.  When you are ready to remove the patch, follow instructions on  the last 2 pages of Patient  Logbook. Stick patch monitor onto the last page of Patient Logbook.  Place Patient Logbook in the blue and white box. Use locking tab on box and tape box closed  securely. The blue and white box has prepaid postage on it. Please place it in the mailbox as  soon as possible. Your physician should have your test results approximately 7 days after the  monitor has been mailed back to [facility].  Call [facility] Customer Care at [phone] if you have questions regarding  your ZIO XT patch monitor. Call them immediately if you see an orange light blinking on your  monitor.  If your monitor falls off in less than 4 days, contact our Monitor department at [phone].  If your monitor becomes loose or falls off after 4 days call [employer] at [phone] for  suggestions on securing your monitor   Follow-Up: At [facility], you and your health needs are our priority.  As part of our continuing mission to provide you with exceptional heart care, our providers are all part of one team.  This team includes your primary Cardiologist (physician) and Advanced Practice Providers or APPs (Physician Assistants and Nurse Practitioners) who all work together to provide you with the care you need, when you need it.  Your next appointment:    6 months with Dr. Francyne  We recommend signing up for the patient portal called MyChart.  Sign up information is provided on this After Visit Summary.  MyChart is used to connect with patients for Virtual Visits (Telemedicine).  Patients are able to view lab/test results, encounter notes, upcoming appointments, etc.  Non-urgent messages can be sent to your provider as well.   To learn more about what you can do with MyChart, go to [email or web address].

## 2024-10-22 NOTE — Progress Notes (Signed)
 Cardiology Office Note:    Date:  10/22/2024   ID:  Autumn Fitzgerald, DOB 30-Sep-1946, MRN [id number]  PCP:  Autumn Atlas, MD  Cardiologist:  Jerel Balding, MD  Electrophysiologist:  None   Referring MD: Autumn Atlas, MD   Chief Complaint  Patient presents with   Palpitations     History of Present Illness:    Autumn Fitzgerald is a 78 y.o. female with a hx of HOCM s/p septal myectomy in 2005, GERD, HTN, HLD, history of near syncope s/p loop recorder that was explanted in 2017, hypothyroidism, LBBB and prediabetes, meningioma, incidentally noted aortic atherosclerosis with normal caliber aorta.    Previous loop recorder (2014-2017) did not show any meaningful arrhythmia. Patient had a normal Myoview  in January 2020 as part of the cardiac clearance prior to surgery.   Echocardiogram obtained recently on 07/06/2022 still shows evidence of hypertrophic cardiomyopathy with previous septal myectomy and a peak gradient of 28 mmHg following the Valsalva maneuver (on my review this is a midcavity gradient, not true LV outflow obstruction), grade 1 diastolic dysfunction, no evidence of elevated mean left atrial pressure and normal pulmonary artery pressure.   On 10/16/2024 she was sitting down with her family when she suddenly felt unwell.  She felt a sensation of warmth driving through her whole body and felt weak and then felt that her heart was pounding very strongly and fast.  As far as I can tell from her description the rhythm was regular and the onset was gradual.  She felt a little lightheaded and had some chest discomfort.  She felt very scared.  She went to the emergency room.  The palpitations resolved gradually.  The high-sensitivity troponin was nominally abnormal at 31 but the repeat test was exactly the same at 31.  Other labs were normal.  ECG shows sinus rhythm with rightward axis and left bundle branch block.  The symptoms have not recurred since then.  She complains of irritable  bowel syndrome and weight loss.  She only takes her atenolol  intermittently, but recently has taken it twice a day since her blood pressure has been consistently elevated.  She describes NYHA functional class II exertional dyspnea (has to stop and rest when she vacuums the living room).  She does not have orthopnea, PND, lower extremity edema, syncope or claudication.  Over the years she has not taken her atenolol  consistently since she complains this makes her too tired and drops her blood pressure too much.    She is generally very sensitive to low doses of meds (including beta blockers and diuretics). Although she could not tolerate a full 25 mg daily dose of atenolol  due to dizziness and weakness, she feels better on 12.5 mg of atenolol .  It helps prevent palpitations and limits episodes of chest pain.  Had dyspnea off diuretics, but taking torsemide more than 5 mg daily causes weakness.  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)    Hypothyroidism    followed by pcp   IBS (irritable bowel syndrome)    LBBB (left bundle branch block)    Mild persistent allergic asthma    no longer   Palpitations    Panic attacks    Pneumonia    Pre-diabetes    Tricuspid regurgitation 07/06/2022   severe   Wears glasses     Past Surgical History:  Procedure Laterality Date   CARDIAC SURGERY  02/05/2007   in [place]    Septal Myectomy  for HOCM   CATARACT EXTRACTION W/ INTRAOCULAR LENS  IMPLANT, BILATERAL  right 07-23-2017;  left 10-15-2017    COLONOSCOPY N/A 11/24/2013   Procedure: COLONOSCOPY;  Surgeon: Renaye Sous, MD;  Location: [facility];  Service: Endoscopy;  Laterality: N/A;   DOPPLER ECHOCARDIOGRAPHY  03/14/2013   EF 65-70%,trivial AI,mild-mod. MR,Mod. TR,mod. concentric hypertrophy   EP IMPLANTABLE DEVICE N/A 08/22/2016   Procedure: Loop Recorder Removal;  Surgeon: Jerel Balding, MD;  Location: [facility];  Service: Cardiovascular;  Laterality: N/A;   ESOPHAGOGASTRODUODENOSCOPY N/A 11/24/2013   Procedure: ESOPHAGOGASTRODUODENOSCOPY (EGD);  Surgeon: Renaye Sous, MD;  Location: [facility];  Service: Endoscopy;  Laterality: N/A;   ESOPHAGOGASTRODUODENOSCOPY N/A 03/11/2024   Procedure: EGD (ESOPHAGOGASTRODUODENOSCOPY);  Surgeon: Wilhelmenia Aloha Raddle., MD;  Location: [facility];  Service: Gastroenterology;  Laterality: N/A;   EUS N/A 03/11/2024   Procedure: UPPER ENDOSCOPIC ULTRASOUND (EUS) RADIAL;  Surgeon: Wilhelmenia Aloha Raddle., MD;  Location: [facility];  Service: Gastroenterology;  Laterality: N/A;   GLAUCOMA SURGERY Bilateral 2019   release pressure   KNEE ARTHROSCOPY Right 10/17/2013   Procedure: ARTHROSCOPY RIGHT KNEE;  Surgeon: Elspeth JONELLE Her, MD;  Location: [facility];  Service: Orthopedics;  Laterality: Right;   KNEE ARTHROSCOPY Left 11/08/2022   Procedure: ARTHROSCOPY KNEE; MEDIAL AND LATERAL MENISECTOMIES, CHONDROPLASTY;  Surgeon: Her Kemps, MD;  Location: [facility];  Service: Orthopedics;  Laterality: Left;  60 min   LAPAROSCOPIC CHOLECYSTECTOMY  02-03-2010   dr d blackman  @[facility]    LOOP RECORDER IMPLANT  03/18/2013   LOOP RECORDER IMPLANT N/A 03/18/2013   Procedure: LOOP RECORDER IMPLANT;  Surgeon: Jerel Balding, MD;  Location: [facility];  Service: Cardiovascular;  Laterality: N/A;   POLYPECTOMY  03/11/2024   Procedure: POLYPECTOMY;  Surgeon: Wilhelmenia Aloha Raddle., MD;  Location: [facility];  Service: Gastroenterology;;   SHOULDER ARTHROSCOPY WITH ROTATOR CUFF REPAIR Right 01/16/2019   Procedure:  Right shoulder arthroscopy with subacromial decompression, distal clavicle resection, rotator cuff repair, bicep tenotomy;  Surgeon: Melita Drivers, MD;  Location: [facility];  Service: Orthopedics;  Laterality: Right;   TUBAL LIGATION  yrs ago    Current Medications: Current Meds  Medication Sig   acetaminophen  (TYLENOL ) 325 MG tablet Take 650 mg by mouth every 6 (six) hours as needed.   Cholecalciferol 50 MCG (2000 UT) TABS Take 2,000 Units by mouth daily.   clobetasol  ointment (TEMOVATE ) 0.05 % Apply 1 Application topically 3 (three) times a week.   clonazePAM (KLONOPIN) 2 MG tablet Take 2 mg by mouth at bedtime.   ezetimibe -simvastatin  (VYTORIN ) 10-40 MG per tablet Take 1 tablet by mouth every evening.    fluticasone  (FLONASE ) 50 MCG/ACT nasal spray Place 1 spray in each nostril once a day as needed for stuffy nose   levothyroxine  (SYNTHROID ) 50 MCG tablet Take 50 mcg by mouth daily.    Multiple Vitamin (MULTIVITAMIN) tablet Take 1 tablet by mouth daily.   pantoprazole  (PROTONIX ) 20 MG tablet Take  1 tablet (20 mg total) by mouth daily.   Polyethyl Glycol-Propyl Glycol 0.4-0.3 % SOLN Place one drop in both eyes daily as needed   XIIDRA 5 % SOLN Place 1 drop into both eyes 2 (two) times daily.   [DISCONTINUED] atenolol  (TENORMIN ) 25 MG tablet Take 0.5 tablets (12.5 mg total) by mouth daily. (Patient taking differently: Take 25 mg by mouth daily.)     Allergies:   Amoxicillin-pot clavulanate, Temazepam, Lipitor [atorvastatin calcium], Amoxicillin, Lansoprazole , and Penicillin g   Family History: The patient's family history includes Breast cancer in her mother; Cancer in her brother, father, mother, and son; Diabetes in her sister; Heart attack in her sister; Hypertension in her mother. There is no history of Liver disease, Colon cancer, Esophageal cancer, Stomach cancer, or Rectal cancer.  ROS:   Please see the history of present illness.    All other systems are reviewed and are  negative.   EKGs/Labs/Other Studies Reviewed:    The following studies were reviewed today:  Myoview  12/24/2018 The left ventricular ejection fraction is hyperdynamic (>65%). Nuclear stress EF: 80%. There was no ST segment deviation noted during stress. The study is normal. This is a low risk study.   Normal pharmacologic nuclear stress test with no evidence for prior infarct or ischemia. Normal LVEF.   Echo 07/06/2022   1. Hyperdynamic contraction, HOCM post septal myomectomy - peak velocity  2.7 m/s, PG 28 mmHg with Valsalva. Left ventricular ejection fraction, by  estimation, is >75%. The left ventricle has hyperdynamic function. The  left ventricle has no regional wall   motion abnormalities. There is mild left ventricular hypertrophy. Left  ventricular diastolic parameters are consistent with Grade I diastolic  dysfunction (impaired relaxation). The average left ventricular global  longitudinal strain is -19.7 %. The  global longitudinal strain is normal.   2. Right ventricular systolic function is normal. The right ventricular  size is normal. There is normal pulmonary artery systolic pressure. The  estimated right ventricular systolic pressure is 24.7 mmHg.   3. No SAM. The mitral valve is normal in structure. Mild mitral valve  regurgitation. No evidence of mitral stenosis.   4. Tricuspid valve regurgitation is severe.   5. The aortic valve is normal in structure. Aortic valve regurgitation is  mild. No aortic stenosis is present.   6. Pulmonic valve regurgitation is moderate.   7. The inferior vena cava is normal in size with greater than 50%  respiratory variability, suggesting right atrial pressure of 3 mmHg.   EKG:    EKG Interpretation Date/Time:    Ventricular Rate:    PR Interval:    QRS Duration:    QT Interval:    QTC Calculation:   R Axis:      Text Interpretation:           Recent Labs: 12/19/2023: ALT 17; TSH 2.67 10/16/2024: B Natriuretic  Peptide 76.0; BUN 11; Creatinine, Ser 0.74; Hemoglobin 13.7; Platelets 240; Potassium 3.7; Sodium 140  Recent Lipid Panel    Component Value Date/Time   CHOL 190 01/03/2021 0231   TRIG 305 (H) 01/03/2021 0231   HDL 52 01/03/2021 0231   CHOLHDL 3.7 01/03/2021 0231   VLDL 19 09/23/2008 2026   LDLCALC 97 01/03/2021 0231    Physical Exam:    VS:  BP 130/68 (BP Location: Left Arm, Patient Position: Sitting, Cuff Size: Normal)   Pulse 82   Ht 5' 1 (1.549 m)   Wt 127 lb (57.6 kg)  SpO2 98%   BMI 24.00 kg/m     Wt Readings from Last 3 Encounters:  10/22/24 127 lb (57.6 kg)  10/16/24 122 lb (55.3 kg)  06/23/24 123 lb 8 oz (56 kg)      General: Alert, oriented x3, no distress, appears well. Head: no evidence of trauma, PERRL, EOMI, no exophtalmos or lid lag, no myxedema, no xanthelasma; normal ears, nose and oropharynx Neck: normal jugular venous pulsations and no hepatojugular reflux; brisk carotid pulses without delay and no carotid bruits Chest: clear to auscultation, no signs of consolidation by percussion or palpation, normal fremitus, symmetrical and full respiratory excursions Cardiovascular: normal position and quality of the apical impulse, regular rhythm, normal first and second heart sounds, no murmurs, rubs or gallops.  Murmur cannot be induced with a Valsalva maneuver. Abdomen: no tenderness or distention, no masses by palpation, no abnormal pulsatility or arterial bruits, normal bowel sounds, no hepatosplenomegaly Extremities: no clubbing, cyanosis or edema; 2+ radial, ulnar and brachial pulses bilaterally; 2+ right femoral, posterior tibial and dorsalis pedis pulses; 2+ left femoral, posterior tibial and dorsalis pedis pulses; no subclavian or femoral bruits Neurological: grossly nonfocal Psych: Normal mood and affect     ASSESSMENT:    1. Hypertrophic cardiomyopathy (HCC)   2. Palpitations   3. Chronic heart failure with preserved ejection fraction (HFpEF) (HCC)    4. LBBB (left bundle branch block)   5. Pure hypertriglyceridemia   6. Acquired hypothyroidism        PLAN:    In order of problems listed above:  HOCM s/p septal myectomy: Ever since her myectomy she has not had any documented LV outflow tract obstruction and I do not hear a murmur today.  She has palpitations and is at risk for both atrial and ventricular arrhythmias, but during 3 previous years of intensive rhythm monitoring with a loop recorder we never saw any meaningful rhythm problems.  Once again encouraged her to take the atenolol  on a regular basis.  She reports that taking the full 25 mg tablet on a daily basis is too much.  She will try to take it 12.5 mg twice daily.   Exertional dyspnea: Suspect she has chronic HFpEF due to hypertrophic cardiomyopathy.  Will update her echocardiogram.  However, note that her BNP was completely normal at 76 when she went to the emergency room. LBBB: Longstanding conduction abnormality.  Relatively narrow QRS.  She has not had symptoms of severe bradycardia to suggest progression of heart block. Hypertension: Mild.  Prefer beta-blockers for control. Hyperlipidemia: Reports her labs are monitored by PCP.  I have not seen any labs since 2022 and all the values were acceptable except for mildly elevated triglycerides (305).  She does not have diabetes mellitus.  She does not have any known CAD or PAD and had a low risk nuclear stress test in the last few years. Hypothyroidism: TSH was checked recently in January 2025 and was in normal range at 2 point 0.670    Medication Adjustments/Labs and Tests Ordered: Current medicines are reviewed at length with the patient today.  Concerns regarding medicines are outlined above.  Orders Placed This Encounter  Procedures   LONG TERM MONITOR (3-14 DAYS)   ECHOCARDIOGRAM COMPLETE    Meds ordered this encounter  Medications   atenolol  (TENORMIN ) 25 MG tablet    Sig: Take 0.5 tablets (12.5 mg total) by  mouth 2 (two) times daily.    Dispense:  180 tablet    Refill:  3  Patient Instructions  Medication Instructions:   TAKE atenolol  12.5mg  twice daily  *If you need a refill on your cardiac medications before your next appointment, please call your pharmacy*   Testing/Procedures: Your physician has requested that you have an echocardiogram. Echocardiography is a painless test that uses sound waves to create images of your heart. It provides your doctor with information about the size and shape of your heart and how well your heart's chambers and valves are working. This procedure takes approximately one hour. There are no restrictions for this procedure. Please do NOT wear cologne, perfume, aftershave, or lotions (deodorant is allowed). Please arrive 15 minutes prior to your appointment time.  Please note: We ask at that you not bring children with you during ultrasound (echo/ vascular) testing. Due to room size and safety concerns, children are not allowed in the ultrasound rooms during exams. Our front office staff cannot provide observation of children in our lobby area while testing is being conducted. An adult accompanying a patient to their appointment will only be allowed in the ultrasound room at the discretion of the ultrasound technician under special circumstances. We apologize for any inconvenience.  ZIO XT- Long Term Monitor Instructions  Your physician has requested you wear a ZIO patch monitor for 14 days.  This is a single patch monitor. [employer] supplies one patch monitor per enrollment. Additional stickers are not available. Please do not apply patch if you will be having a Nuclear Stress Test,  Echocardiogram, Cardiac CT, MRI, or Chest Xray during the period you would be wearing the  monitor. The patch cannot be worn during these tests. You cannot remove and re-apply the  ZIO XT patch monitor.  Your ZIO patch monitor will be mailed 3 day USPS to your address on file.  It may take 3-5 days  to receive your monitor after you have been enrolled.  Once you have received your monitor, please review the enclosed instructions. Your monitor  has already been registered assigning a specific monitor serial # to you.  Billing and Patient Assistance Program Information  We have supplied [employer] with any of your insurance information on file for billing purposes. [employer] offers a sliding scale Patient Assistance Program for patients that do not have  insurance, or whose insurance does not completely cover the cost of the ZIO monitor.  You must apply for the Patient Assistance Program to qualify for this discounted rate.  To apply, please call [employer] at [phone], select option 4, select option 2, ask to apply for  Patient Assistance Program. Meredeth will ask your household income, and how many people  are in your household. They will quote your out-of-pocket cost based on that information.  [employer] will also be able to set up a 30-month, interest-free payment plan if needed.  Applying the monitor   Shave hair from upper left chest.  Hold abrader disc by orange tab. Rub abrader in 40 strokes over the upper left chest as  indicated in your monitor instructions.  Clean area with 4 enclosed alcohol pads. Let dry.  Apply patch as indicated in monitor instructions. Patch will be placed under collarbone on left  side of chest with arrow pointing upward.  Rub patch adhesive wings for 2 minutes. Remove white label marked 1. Remove the white  label marked 2. Rub patch adhesive wings for 2 additional minutes.  While looking in a mirror, press and release button in center of patch. A small green light will  flash 3-4 times.  This will be your only indicator that the monitor has been turned on.  Do not shower for the first 24 hours. You may shower after the first 24 hours.  Press the button if you feel a symptom. You will hear a small click. Record Date, Time and   Symptom in the Patient Logbook.  When you are ready to remove the patch, follow instructions on the last 2 pages of Patient  Logbook. Stick patch monitor onto the last page of Patient Logbook.  Place Patient Logbook in the blue and white box. Use locking tab on box and tape box closed  securely. The blue and white box has prepaid postage on it. Please place it in the mailbox as  soon as possible. Your physician should have your test results approximately 7 days after the  monitor has been mailed back to [facility].  Call [facility] Customer Care at [phone] if you have questions regarding  your ZIO XT patch monitor. Call them immediately if you see an orange light blinking on your  monitor.  If your monitor falls off in less than 4 days, contact our Monitor department at [phone].  If your monitor becomes loose or falls off after 4 days call [employer] at [phone] for  suggestions on securing your monitor   Follow-Up: At [facility], you and your health needs are our priority.  As part of our continuing mission to provide you with exceptional heart care, our providers are all part of one team.  This team includes your primary Cardiologist (physician) and Advanced Practice Providers or APPs (Physician Assistants and Nurse Practitioners) who all work together to provide you with the care you need, when you need it.  Your next appointment:    6 months with Dr. Francyne  We recommend signing up for the patient portal called MyChart.  Sign up information is provided on this After Visit Summary.  MyChart is used to connect with patients for Virtual Visits (Telemedicine).  Patients are able to view lab/test results, encounter notes, upcoming appointments, etc.  Non-urgent messages can be sent to your provider as well.   To learn more about what you can do with MyChart, go to [email or web address].               Signed, Jerel Francyne, MD  10/22/2024  10:34 AM    [facility]

## 2024-10-28 ENCOUNTER — Telehealth: Payer: Self-pay | Admitting: Cardiovascular Disease

## 2024-10-28 NOTE — Telephone Encounter (Signed)
 Pt c/o of Chest Pain: STAT if active (IN THIS MOMENT) CP, including tightness, pressure, jaw pain, shoulder/upper arm/back pain, SOB, nausea, and vomiting.  1. Are you having CP right now (tightness, pressure, or discomfort)? Yes  2. Are you experiencing any other symptoms (ex. SOB, nausea, vomiting, sweating)? Nausea  3. How long have you been experiencing CP? A week   4. Is your CP continuous or coming and going? Coming and going   5. Have you taken Nitroglycerin? N/A  6. If CP returns before callback, please consider calling 911. ?

## 2024-10-28 NOTE — Telephone Encounter (Signed)
 Pt c/o of Chest Pain: STAT if active (IN THIS MOMENT) CP, including tightness, pressure, jaw pain, shoulder/upper arm/back pain, SOB, nausea, and vomiting.   1. Are you having CP right now (tightness, pressure, or discomfort)? Yes   2. Are you experiencing any other symptoms (ex. SOB, nausea, vomiting, sweating)? Nausea   3. How long have you been experiencing CP? A week    4. Is your CP continuous or coming and going? Coming and going    5. Have you taken Nitroglycerin? N/A   6. If CP returns before callback, please consider calling 911. ?    I called and spoke with the patient. She was very adamant on coming into the office but when I asked her what her symptoms are and recommended that she go to the ED, she raised her voice and stated that no one is helping her. She is currently having chest pain that is radiating to her back and she states she feels this all over her body. She has no way of checking her blood pressure but is saying that she knows it is high most likely. This pain and feeling nauseated has been going on for a week. I advised for her to be seen immediately in the emergency room to be evaluated. She said she went on 10/16/24, saw Dr. JAYSON, got an echo scheduled, had labs drawn, vitals taken and then was discharge. She said that she wants her echo done before and to see Dr. JAYSON. I told her I can reach out to our scheduler and Dr. JAYSON for that but for right now, she needs to be seen in the ED because of this chest pain. I was getting another high priority call and asked if she could wait on the line. She said thank you for your help and hung up before I could speak further. I will reach out to Dr. JAYSON for any further advise.

## 2024-10-28 NOTE — Telephone Encounter (Signed)
 No additional advice than what you have already told her.  See if there is any way you can get her echo scheduled sooner, please.

## 2024-11-03 ENCOUNTER — Telehealth: Payer: Self-pay | Admitting: Gastroenterology

## 2024-11-03 NOTE — Telephone Encounter (Signed)
 Left message for pt to call back

## 2024-11-03 NOTE — Telephone Encounter (Signed)
 Inbound call from patient requesting to see Dr. Charlanne only due to her having rectal pain and coccyx pain as well. Patient stated that she would accept January the 21 st but would like for the nurse to follow up with her in regards to her pain and seeing if she can be seen soon with Dr. Charlanne. Please advise.

## 2024-11-04 NOTE — Telephone Encounter (Signed)
 Unable to reach pt. Unable to leave voice message

## 2024-11-05 NOTE — Telephone Encounter (Signed)
 Pt stated that she has been having rectal pain, coccyx pain, and hemorrhoids requesting sooner office visit. Pt was scheduled to see Deanna May NP on 11/25/2024 at 3:20 PM. Pt made aware.  Pt verbalized understanding with all questions answered.

## 2024-11-14 ENCOUNTER — Ambulatory Visit: Admitting: Cardiovascular Disease

## 2024-11-18 ENCOUNTER — Ambulatory Visit: Payer: Self-pay | Admitting: Cardiovascular Disease

## 2024-11-18 DIAGNOSIS — R002 Palpitations: Secondary | ICD-10-CM

## 2024-11-25 ENCOUNTER — Encounter: Payer: Self-pay | Admitting: Gastroenterology

## 2024-11-25 ENCOUNTER — Ambulatory Visit: Admitting: Gastroenterology

## 2024-11-25 VITALS — BP 120/70 | HR 70 | Ht 61.0 in | Wt 124.0 lb

## 2024-11-25 DIAGNOSIS — M6289 Other specified disorders of muscle: Secondary | ICD-10-CM

## 2024-11-25 DIAGNOSIS — L89896 Pressure-induced deep tissue damage of other site: Secondary | ICD-10-CM | POA: Diagnosis not present

## 2024-11-25 DIAGNOSIS — K581 Irritable bowel syndrome with constipation: Secondary | ICD-10-CM

## 2024-11-25 DIAGNOSIS — K6289 Other specified diseases of anus and rectum: Secondary | ICD-10-CM

## 2024-11-25 DIAGNOSIS — K648 Other hemorrhoids: Secondary | ICD-10-CM

## 2024-11-25 DIAGNOSIS — K623 Rectal prolapse: Secondary | ICD-10-CM

## 2024-11-25 NOTE — Progress Notes (Signed)
 ANOSCOPY PROCEDURE REPORT:     INDICATION:rectal pain     The nature of anoscopy procedure and rationale for use was explained to the patient and they understood and agreed to proceed.     The lubricated lighted anoscope was inserted with the patient in the left lateral decubitus position and the anus and distal rectum was examined.  Chaperone Denise     FINDINGS: internal hemorrhoids, rectal prolapse, pressure injury to coccyx area

## 2024-11-25 NOTE — Progress Notes (Signed)
 Chief Complaint:rectal pain, coccyx pain, and hemorrhoids  Primary GI Doctor:Dr. Charlanne  HPI: Autumn Fitzgerald is a 78 y.o. female  hx of HOCM s/p septal myectomy in 2005, GERD, HTN, HLD, history of near syncope s/p loop recorder that was explanted in 2017, hypothyroidism, LBBB and prediabetes, meningioma, Nl EF (2DE-06/2022 >75%), anxiety, s/p cholecystectomy.  From prev notes: She has been having epi pain x since 2015, worse with eating.  Associated with bloating and early satiety.  She also has intermittent nausea.  No dysphagia.  Has seen several gastroenterologists with extensive GI workup.  -Seen by Dr. Kristie had neg EGD in 2015, normal GES, negative CT Abdo/pelvis, negative hydrogen SIBO breath test 08/2017 -Seen by Dr. Vivi Phillips underwent EGD 11/05/2017 which showed pyloric stenosis, dilated to 14 mm.  Multiple hyperplastic gastric polyps.  She did not have any benefit after pyloric dilatation.  Underwent repeat GES on 01/03/2018 which was normal.  She also had normal EGD on 01/10/2018.  Diagnosed with functional dyspepsia.  Given trial of amitriptyline  25 mg p.o. nightly which she did not tolerate. -She has tried multiple medications including probiotics, PPIs, IBgard, FD guard without any significant relief.   Also with longstanding history of constipation with colonoscopy 07/2018 showing small sessile polyps.  It was recommended to perform repeat colonoscopy in 5 years.    Interval History Patient was last seen in the GI office on 05/01/2024 by Dr. Charlanne.  Patient presents with pain in rectum and coccyx area.  She reports she used OTC prepH and Procto Med topical without improvement.  She was seen by OB/GYN and she was treated for lichen sclerosis and she is using clobetasol  cream.  She reports she sits a lot during the day watching T.V.  She has history of IBS-C managed with dietary modifications and OTC Miralax. She is using OTC Miralax po daily and has BM every 1-2 days. No  blood in stool. No pain with BM. She reports dicyclomine  no longer covered by insurance.   Wt Readings from Last 3 Encounters:  11/25/24 124 lb (56.2 kg)  10/22/24 127 lb (57.6 kg)  10/16/24 122 lb (55.3 kg)    Past GI WU:   EUS 03/11/2024   EGD impression: - No gross lesions in the entire esophagus. Z- line irregular, 35 cm from the incisors. - 1 cm hiatal hernia. - Multiple gastric polyps. 2 with evidence of recent oozing/ bleeding were resected and retrieved. - Erythematous mucosa in the antrum. No other gross lesions in the entire stomach. The stomach is previously been biopsied for HP and was negative, so not redone. - No gross lesions in the duodenal bulb, EUS impression: - Endosonographic images of the stomach were unremarkable. I could not visualize significant abnormality in the wall of the stomach. - Endosonographic images of the pancreas showed no evidence of a mass or lesion with a normal pancreas duct throughout. - There was no sign of significant pathology in the common bile duct and in the common hepatic duct. - No malignant- appearing lymph nodes were visualized in the celiac region ( level 20) , perigastric region, peripancreatic region and porta hepatis region.   Bx: Neg SB Bx, Gastric polyps-fundic gland polyps    EGD 10/31/2023 - Normal esophagus. - Z- line regular, 35 cm from the incisors. - Multiple gastric polyps. Resected and retrieved x 3. - Normal examined duodenum. -Bx: neg for HP, fundic gland polyps   Colonoscopy 10/31/2023 - One 4 mm polyp in the distal  sigmoid colon, removed with a cold snare. Resected and retrieved. - Non- bleeding internal hemorrhoids. - The examined portion of the ileum was normal. -Bx- neg. No need to rpt     CT AP with contrast 08/31/2023 No bowel obstruction, free air or free fluid. Normal appendix. Stomach is underdistended but there is some questionable fold thickening. Please correlate with any particular symptoms. Fatty liver  infiltration.  Previous cholecystectomy.   EGD 11/05/2017 (Dr Lonni Phillips): Pyloric stenosis, dil 14 mm. Multiple small gastric polyps   Colon 07/15/2018: (Dr. Lonni Phillips) Small sessile polyps s/p polypectomy.  I could not find biopsy results.  Repeat 5 years.   EGD 11/24/2013: Dr. Seymour polyps s/p polypectomy using hot snare, small HH.   Colonoscopy 11/24/2013: Dr. Levis but normal.  Some stool in the colon.  Internal hemorrhoids.  Per report repeat 10 years.   MRI head with contrast 02/2022 1. Negative for retrocochlear lesion. 2. Mild mastoid opacification on the left more than right. 3. 9 mm high right frontal meningioma without worrisome change from 2016.   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)    Hypothyroidism    followed by pcp   IBS (irritable bowel syndrome)    LBBB (left bundle branch block)    Mild persistent allergic asthma    no longer   Palpitations    Panic attacks    Pneumonia    Pre-diabetes    Tricuspid regurgitation 07/06/2022   severe   Wears glasses     Past Surgical History:  Procedure Laterality Date   CARDIAC SURGERY  02/05/2007   in [place]    Septal Myectomy  for HOCM   CATARACT EXTRACTION W/ INTRAOCULAR LENS  IMPLANT, BILATERAL  right 07-23-2017;  left 10-15-2017   COLONOSCOPY N/A 11/24/2013   Procedure: COLONOSCOPY;  Surgeon: Renaye Sous, MD;  Location: [facility];  Service: Endoscopy;  Laterality: N/A;   DOPPLER ECHOCARDIOGRAPHY  03/14/2013   EF 65-70%,trivial  AI,mild-mod. MR,Mod. TR,mod. concentric hypertrophy   EP IMPLANTABLE DEVICE N/A 08/22/2016   Procedure: Loop Recorder Removal;  Surgeon: Jerel Balding, MD;  Location: [facility];  Service: Cardiovascular;  Laterality: N/A;   ESOPHAGOGASTRODUODENOSCOPY N/A 11/24/2013   Procedure: ESOPHAGOGASTRODUODENOSCOPY (EGD);  Surgeon: Renaye Sous, MD;  Location: [facility];  Service: Endoscopy;  Laterality: N/A;   ESOPHAGOGASTRODUODENOSCOPY N/A 03/11/2024   Procedure: EGD (ESOPHAGOGASTRODUODENOSCOPY);  Surgeon: Wilhelmenia Aloha Raddle., MD;  Location: [facility];  Service: Gastroenterology;  Laterality: N/A;   EUS N/A 03/11/2024   Procedure: UPPER ENDOSCOPIC ULTRASOUND (EUS) RADIAL;  Surgeon: Wilhelmenia Aloha Raddle., MD;  Location: [facility];  Service: Gastroenterology;  Laterality: N/A;   GLAUCOMA SURGERY Bilateral 2019   release pressure   KNEE ARTHROSCOPY Right 10/17/2013   Procedure: ARTHROSCOPY RIGHT KNEE;  Surgeon: Elspeth JONELLE Her, MD;  Location: [facility];  Service: Orthopedics;  Laterality: Right;   KNEE ARTHROSCOPY Left 11/08/2022   Procedure: ARTHROSCOPY KNEE;  MEDIAL AND LATERAL MENISECTOMIES, CHONDROPLASTY;  Surgeon: Kay Kemps, MD;  Location: [facility];  Service: Orthopedics;  Laterality: Left;  60 min   LAPAROSCOPIC CHOLECYSTECTOMY  02-03-2010   dr d blackman  @[facility]    LOOP RECORDER IMPLANT  03/18/2013   LOOP RECORDER IMPLANT N/A 03/18/2013   Procedure: LOOP RECORDER IMPLANT;  Surgeon: Jerel Balding, MD;  Location: [facility];  Service: Cardiovascular;  Laterality: N/A;   MOUTH SURGERY  11/21/2024   tooth removed   POLYPECTOMY  03/11/2024   Procedure: POLYPECTOMY;  Surgeon: Mansouraty, Aloha Raddle., MD;  Location: [facility];  Service: Gastroenterology;;   SHOULDER ARTHROSCOPY WITH ROTATOR CUFF REPAIR Right 01/16/2019   Procedure: Right shoulder arthroscopy with subacromial decompression, distal clavicle resection, rotator cuff repair, bicep tenotomy;  Surgeon: Melita Drivers,  MD;  Location: [facility];  Service: Orthopedics;  Laterality: Right;   TUBAL LIGATION  yrs ago    Current Outpatient Medications  Medication Sig Dispense Refill   acetaminophen  (TYLENOL ) 325 MG tablet Take 650 mg by mouth every 6 (six) hours as needed.     albuterol  (VENTOLIN  HFA) 108 (90 Base) MCG/ACT inhaler INHALE 2 PUFFS INTO THE LUNGS EVERY 4 HOURS AS NEEDED FOR WHEEZING OR SHORTNESS OF BREATH 18 g 1   ARNUITY ELLIPTA  100 MCG/ACT AEPB INHALE 1 PUFF INTO THE LUNGS EVERY MORNING 90 each 1   atenolol  (TENORMIN ) 25 MG tablet Take 0.5 tablets (12.5 mg total) by mouth 2 (two) times daily. 180 tablet 3   Boswellia-Glucosamine-Vit D (OSTEO BI-FLEX ONE PER DAY PO) Take 1 tablet by mouth.     Cholecalciferol 50 MCG (2000 UT) TABS Take 2,000 Units by mouth daily.     clobetasol  ointment (TEMOVATE ) 0.05 % Apply 1 Application topically 3 (three) times a week. 180 each 3   clonazePAM (KLONOPIN) 2 MG tablet Take 2 mg by mouth daily as needed. (Patient taking differently: Take 1 mg by mouth daily as needed.)     ezetimibe -simvastatin  (VYTORIN ) 10-40 MG per tablet Take 1 tablet by mouth every evening.      fluticasone  (FLONASE ) 50 MCG/ACT nasal spray Place 1 spray in each nostril once a day as needed for stuffy nose 48 g 1   levothyroxine  (SYNTHROID ) 50 MCG tablet Take 50 mcg by mouth daily.      Multiple Vitamin (MULTIVITAMIN) tablet Take 1 tablet by mouth daily.     pantoprazole  (PROTONIX ) 20 MG tablet Take 1 tablet (20 mg total) by mouth daily. 90 tablet 4   XIIDRA 5 % SOLN Place 1 drop into both eyes 2 (two) times daily.     No current facility-administered medications for this visit.    Allergies as of 11/25/2024 - Review Complete 11/25/2024  Allergen Reaction Noted   Amoxicillin-pot clavulanate Other (See Comments) 06/02/2014   Temazepam Other (See Comments) 01/28/2016   Lipitor [atorvastatin calcium]  01/08/2019   Amoxicillin Nausea And Vomiting 07/17/2017   Lansoprazole  Palpitations 10/26/2022    Penicillin g Other (See Comments) 01/08/2017    Family History  Problem Relation Age of Onset   Hypertension Mother    Breast cancer Mother    Fibrocystic breast disease Mother    Prostate cancer Father    Diabetes Sister    Heart attack Sister    Cancer Brother    Cancer Son    Liver disease Neg Hx    Colon cancer Neg Hx    Esophageal cancer Neg Hx    Stomach cancer Neg Hx    Rectal cancer  Neg Hx     Review of Systems:    Constitutional: No weight loss, fever, chills, weakness or fatigue HEENT: Eyes: No change in vision               Ears, Nose, Throat:  No change in hearing or congestion Skin: No rash or itching Cardiovascular: No chest pain, chest pressure or palpitations   Respiratory: No SOB or cough Gastrointestinal: See HPI and otherwise negative Genitourinary: No dysuria or change in urinary frequency Neurological: No headache, dizziness or syncope Musculoskeletal: No new muscle or joint pain Hematologic: No bleeding or bruising Psychiatric: No history of depression or anxiety    Physical Exam:  Vital signs: BP 120/70   Pulse 70   Ht 5' 1 (1.549 m)   Wt 124 lb (56.2 kg)   BMI 23.43 kg/m   Constitutional:   Pleasant  female appears to be in NAD, Well developed, Well nourished, alert and cooperative Eyes:   PEERL, EOMI. No icterus. Conjunctiva pink. Neck:  Supple Throat: Oral cavity and pharynx without inflammation, swelling or lesion.  Respiratory: Respirations even and unlabored. Lungs clear to auscultation bilaterally.   No wheezes, crackles, or rhonchi.  Cardiovascular: Normal S1, S2. Regular rate and rhythm. No peripheral edema, cyanosis or pallor.  Gastrointestinal:  Soft, nondistended, nontender. No rebound or guarding. Normal bowel sounds. No appreciable masses or hepatomegaly. Rectal: external rectal exam with discoloration noted from pressure, weakened rectal tone, appreciated internal hemorrhoids, non-tender, no masses, , brown stool, hemoccult  N/A . Chaperone Denise Anoscopy: internal hemorrhoids, rectal prolapse Msk:  Symmetrical without gross deformities. Without edema, no deformity or joint abnormality.  Neurologic:  Alert and  oriented x4;  grossly normal neurologically.  Skin:   Dry and intact without significant lesions or rashes.  RELEVANT LABS AND IMAGING: CBC    Latest Ref Rng & Units 10/16/2024   11:44 AM 12/19/2023    2:18 PM 08/31/2023   10:14 AM  CBC  WBC 4.0 - 10.5 K/uL 6.9  7.0  5.2   Hemoglobin 12.0 - 15.0 g/dL 86.2  86.0  85.9   Hematocrit 36.0 - 46.0 % 42.2  42.4  42.3   Platelets 150 - 400 K/uL 240  260.0  256      CMP     Latest Ref Rng & Units 10/16/2024   11:44 AM 12/19/2023    2:18 PM 08/31/2023   10:14 AM  CMP  Glucose 70 - 99 mg/dL 88  75  95   BUN 8 - 23 mg/dL 11  8  10    Creatinine 0.44 - 1.00 mg/dL 9.25  9.21  9.15   Sodium 135 - 145 mmol/L 140  140  141   Potassium 3.5 - 5.1 mmol/L 3.7  3.7  3.7   Chloride 98 - 111 mmol/L 103  102  104   CO2 22 - 32 mmol/L 28  29  27    Calcium 8.9 - 10.3 mg/dL 8.8  9.1  9.2   Total Protein 6.0 - 8.3 g/dL  7.5  7.2   Total Bilirubin 0.2 - 1.2 mg/dL  1.1  1.5   Alkaline Phos 39 - 117 U/L  95  85   AST 0 - 37 U/L  27  24   ALT 0 - 35 U/L  17  21      Lab Results  Component Value Date   TSH 2.67 12/19/2023     Assessment/Plan: Encounter Diagnoses  Name Primary?   Rectal pain  Yes   Irritable bowel syndrome with constipation    Pelvic floor dysfunction in female    Pressure injury of deep tissue of other site    Internal hemorrhoids    #1. Rectal pain, pressure area on coccyx area #2 Rectal prolapse  #3 Abn CT showing gastric wall thickening d/t fundic gland polyps. Neg EUS 03/2024. Neg EGD 10/2023 except for gastric polyps. H/O pyloric stenosis s/p dilatation to 14 mm 2018. Dx with functional dyspepsia. Neg eval for gastroparesis including GES, Previous cholecystectomy.   #4. IBS-C with bloating. Neg colon Nov 2024.No repeat d/t age. Excerberated  with stress or eating certain foods.  Plan: -purchase doughnut pillow for pressure relief -avoid prolonged sitting -referral pelvic floor therapy -IBgard OTC 2 capsules with meals -avoid food triggers  Thank you for the courtesy of this consult. Please call me with any questions or concerns.   Jamayia Croker, FNP-C [facility] 11/25/2024, 4:17 PM  Cc: Shelda Atlas, MD

## 2024-11-25 NOTE — Patient Instructions (Addendum)
 Pressure area on buttocks Purchase doughnut pillow to take pressure off area  Pelvic floor dysfunction Referral to pelvic floor therapy  Abdominal discomfort Ibgard 2 capsules with meals, can purchase over the counter   _______________________________________________________  If your blood pressure at your visit was 140/90 or greater, please contact your primary care physician to follow up on this.  _______________________________________________________  If you are age 65 or older, your body mass index should be between 23-30. Your Body mass index is 23.43 kg/m. If this is out of the aforementioned range listed, please consider follow up with your Primary Care Provider.  If you are age 14 or younger, your body mass index should be between 19-25. Your Body mass index is 23.43 kg/m. If this is out of the aformentioned range listed, please consider follow up with your Primary Care Provider.   ________________________________________________________  The [facility] providers would like to encourage you to use MYCHART to communicate with providers for non-urgent requests or questions.  Due to long hold times on the telephone, sending your provider a message by [facility] may be a faster and more efficient way to get a response.  Please allow 48 business hours for a response.  Please remember that this is for non-urgent requests.  _______________________________________________________  [facility] is using a team-based approach to care.  Your team is made up of your doctor and two to three APPS. Our APPS (Nurse Practitioners and Physician Assistants) work with your physician to ensure care continuity for you. They are fully qualified to address your health concerns and develop a treatment plan. They communicate directly with your gastroenterologist to care for you. Seeing the Advanced Practice Practitioners on your physician's team can help you by facilitating care more promptly,  often allowing for earlier appointments, access to diagnostic testing, procedures, and other specialty referrals.   Thank you for trusting me with your gastrointestinal care. Deanna May, FNP-C

## 2024-11-28 ENCOUNTER — Ambulatory Visit (HOSPITAL_COMMUNITY)
Admission: RE | Admit: 2024-11-28 | Discharge: 2024-11-28 | Disposition: A | Discharge status: Home / Self Care | Source: Ambulatory Visit | Attending: Cardiovascular Disease | Admitting: Cardiovascular Disease

## 2024-11-28 DIAGNOSIS — I422 Other hypertrophic cardiomyopathy: Secondary | ICD-10-CM

## 2024-11-30 LAB — ECHOCARDIOGRAM COMPLETE
Area-P 1/2: 2.91 cm2
MV M vel: 4.14 m/s
MV Peak grad: 68.6 mmHg
S' Lateral: 2.1 cm

## 2024-12-01 ENCOUNTER — Telehealth: Payer: Self-pay | Admitting: Cardiovascular Disease

## 2024-12-01 NOTE — Progress Notes (Signed)
reported

## 2024-12-01 NOTE — Telephone Encounter (Signed)
"  °  °  Per Dr. JAYSON: No serious rhythm abnormalities are seen. Taking the atenolol  consistently should help lessen the occurrence of the unpleasant heartbeats. No other change in medication recommended.   Pt given monitor results. Pt awaiting ECHO results. Will send to Dr. Francyne for outcome of test. Pt aware that Doctor is out of office this week but someone will reach out once ECHO is read.    Verbalizes understanding of plan. "

## 2024-12-01 NOTE — Telephone Encounter (Signed)
 Patient stated she was returning staff call regarding echocardiogram test results.

## 2024-12-01 NOTE — Telephone Encounter (Signed)
 Per Dr. JAYSON: No serious rhythm abnormalities are seen. Taking the atenolol  consistently should help lessen the occurrence of the unpleasant heartbeats. No other change in medication recommended.  Pt given monitor results. Pt awaiting ECHO results. Will send to Dr. Francyne for outcome of test. Pt aware that Doctor is out of office this week but someone will reach out once ECHO is read.   Verbalizes understanding of plan.

## 2024-12-08 ENCOUNTER — Telehealth: Payer: Self-pay | Admitting: Allergy and Immunology

## 2024-12-08 NOTE — Telephone Encounter (Signed)
 Pt called to get a refill on Albuterol 

## 2024-12-09 MED ORDER — ALBUTEROL SULFATE HFA 108 (90 BASE) MCG/ACT IN AERS: 2.0000 | INHALATION_SPRAY | RESPIRATORY_TRACT | 1 refills | Status: AC | PRN

## 2024-12-09 NOTE — Telephone Encounter (Signed)
 Refill of Albuterol  sent to [facility].

## 2024-12-30 ENCOUNTER — Ambulatory Visit: Admitting: Dermatology

## 2024-12-31 ENCOUNTER — Ambulatory Visit: Admitting: Gastroenterology

## 2025-01-12 ENCOUNTER — Ambulatory Visit: Payer: Self-pay | Admitting: Obstetrics and Gynecology

## 2025-01-19 ENCOUNTER — Ambulatory Visit: Admitting: Obstetrics and Gynecology

## 2025-01-22 ENCOUNTER — Ambulatory Visit: Admitting: Physical Therapy

## 2025-02-17 ENCOUNTER — Ambulatory Visit: Admitting: Allergy and Immunology

## 2025-03-05 ENCOUNTER — Ambulatory Visit: Admitting: Physical Therapy

## 2025-05-20 ENCOUNTER — Ambulatory Visit: Admitting: Cardiovascular Disease
